# Patient Record
Sex: Male | Born: 1940 | Race: White | Hispanic: No | Smoking: Never smoker
Health system: Southern US, Community
[De-identification: ages and names within clinical notes are randomized; demographics above are authoritative.]

## PROBLEM LIST (undated history)

## (undated) DIAGNOSIS — E782 Mixed hyperlipidemia: Secondary | ICD-10-CM

## (undated) DIAGNOSIS — E039 Hypothyroidism, unspecified: Secondary | ICD-10-CM

## (undated) DIAGNOSIS — I10 Essential (primary) hypertension: Secondary | ICD-10-CM

## (undated) DIAGNOSIS — E079 Disorder of thyroid, unspecified: Secondary | ICD-10-CM

## (undated) DIAGNOSIS — E785 Hyperlipidemia, unspecified: Secondary | ICD-10-CM

## (undated) DIAGNOSIS — I219 Acute myocardial infarction, unspecified: Secondary | ICD-10-CM

## (undated) DIAGNOSIS — K219 Gastro-esophageal reflux disease without esophagitis: Secondary | ICD-10-CM

## (undated) DIAGNOSIS — Z5189 Encounter for other specified aftercare: Secondary | ICD-10-CM

## (undated) DIAGNOSIS — I2510 Atherosclerotic heart disease of native coronary artery without angina pectoris: Secondary | ICD-10-CM

## (undated) HISTORY — DX: Mixed hyperlipidemia: E78.2

## (undated) HISTORY — DX: Essential (primary) hypertension: I10

## (undated) HISTORY — DX: Hypothyroidism, unspecified: E03.9

## (undated) HISTORY — DX: Atherosclerotic heart disease of native coronary artery without angina pectoris: I25.10

## (undated) HISTORY — DX: Hyperlipidemia, unspecified: E78.5

## (undated) HISTORY — DX: Acute myocardial infarction, unspecified: I21.9

## (undated) HISTORY — PX: APPENDECTOMY: SHX54

## (undated) HISTORY — PX: THYROID SURGERY: SHX805

## (undated) HISTORY — DX: Encounter for other specified aftercare: Z51.89

## (undated) HISTORY — DX: Gastro-esophageal reflux disease without esophagitis: K21.9

## (undated) HISTORY — DX: Disorder of thyroid, unspecified: E07.9

## (undated) HISTORY — PX: HERNIA REPAIR: SHX51

## (undated) HISTORY — PX: SMALL INTESTINE SURGERY: SHX150

## (undated) HISTORY — PX: CORONARY ARTERY BYPASS GRAFT: SHX141

## (undated) HISTORY — PX: CERVICAL SPINE SURGERY: SHX589

---

## 1997-08-21 ENCOUNTER — Inpatient Hospital Stay (HOSPITAL_COMMUNITY): Admission: EM | Admit: 1997-08-21 | Discharge: 1997-08-24 | Payer: Self-pay | Admitting: Emergency Medicine

## 2000-12-28 ENCOUNTER — Encounter: Payer: Self-pay | Admitting: Cardiovascular Disease

## 2000-12-28 ENCOUNTER — Ambulatory Visit (HOSPITAL_COMMUNITY): Admission: RE | Admit: 2000-12-28 | Discharge: 2000-12-29 | Payer: Self-pay | Admitting: Cardiovascular Disease

## 2001-04-06 ENCOUNTER — Encounter: Payer: Self-pay | Admitting: *Deleted

## 2001-04-06 ENCOUNTER — Inpatient Hospital Stay (HOSPITAL_COMMUNITY): Admission: EM | Admit: 2001-04-06 | Discharge: 2001-04-07 | Payer: Self-pay | Admitting: *Deleted

## 2001-04-07 ENCOUNTER — Encounter: Payer: Self-pay | Admitting: Cardiovascular Disease

## 2001-04-29 ENCOUNTER — Ambulatory Visit (HOSPITAL_COMMUNITY): Admission: RE | Admit: 2001-04-29 | Discharge: 2001-04-29 | Payer: Self-pay | Admitting: Gastroenterology

## 2001-04-29 ENCOUNTER — Encounter: Payer: Self-pay | Admitting: Gastroenterology

## 2001-05-02 ENCOUNTER — Ambulatory Visit (HOSPITAL_COMMUNITY): Admission: RE | Admit: 2001-05-02 | Discharge: 2001-05-02 | Payer: Self-pay | Admitting: Gastroenterology

## 2001-05-11 ENCOUNTER — Encounter (INDEPENDENT_AMBULATORY_CARE_PROVIDER_SITE_OTHER): Payer: Self-pay | Admitting: Specialist

## 2001-05-11 ENCOUNTER — Ambulatory Visit (HOSPITAL_COMMUNITY): Admission: RE | Admit: 2001-05-11 | Discharge: 2001-05-11 | Payer: Self-pay | Admitting: Gastroenterology

## 2003-05-07 ENCOUNTER — Emergency Department (HOSPITAL_COMMUNITY): Admission: EM | Admit: 2003-05-07 | Discharge: 2003-05-07 | Payer: Self-pay | Admitting: *Deleted

## 2008-07-06 ENCOUNTER — Encounter: Admission: RE | Admit: 2008-07-06 | Discharge: 2008-07-06 | Payer: Self-pay | Admitting: Emergency Medicine

## 2008-07-07 ENCOUNTER — Encounter: Admission: RE | Admit: 2008-07-07 | Discharge: 2008-07-07 | Payer: Self-pay | Admitting: Emergency Medicine

## 2008-08-02 ENCOUNTER — Ambulatory Visit (HOSPITAL_COMMUNITY): Admission: RE | Admit: 2008-08-02 | Discharge: 2008-08-03 | Payer: Self-pay | Admitting: Neurosurgery

## 2009-08-30 ENCOUNTER — Inpatient Hospital Stay (HOSPITAL_COMMUNITY): Admission: RE | Admit: 2009-08-30 | Discharge: 2009-08-31 | Payer: Self-pay | Admitting: Cardiovascular Disease

## 2010-06-16 LAB — BASIC METABOLIC PANEL
BUN: 13 mg/dL (ref 6–23)
BUN: 14 mg/dL (ref 6–23)
CO2: 30 mEq/L (ref 19–32)
Calcium: 9.2 mg/dL (ref 8.4–10.5)
Chloride: 101 mEq/L (ref 96–112)
Creatinine, Ser: 0.82 mg/dL (ref 0.4–1.5)
GFR calc Af Amer: >60 mL/min (ref >60)
GFR calc Af Amer: >60 mL/min (ref >60)
GFR calc non Af Amer: >60 mL/min (ref >60)
Potassium: 3.9 mEq/L (ref 3.5–5.1)
Sodium: 137 mEq/L (ref 135–145)

## 2010-06-16 LAB — PROTIME-INR
INR: 0.99 (ref 0.00–1.49)
Prothrombin Time: 13 seconds (ref 11.6–15.2)

## 2010-06-16 LAB — CBC
MCHC: 34.2 g/dL (ref 30.0–36.0)
Platelets: 175 10*3/uL (ref 150–400)
Platelets: 204 10*3/uL (ref 150–400)
RBC: 4.8 MIL/uL (ref 4.22–5.81)
RDW: 13.4 % (ref 11.5–15.5)
WBC: 7.2 10*3/uL (ref 4.0–10.5)
WBC: 8.7 10*3/uL (ref 4.0–10.5)

## 2010-06-16 LAB — CARDIAC PANEL(CRET KIN+CKTOT+MB+TROPI)
CK, MB: 1.4 ng/mL (ref 0.3–4.0)
CK, MB: 1.5 ng/mL (ref 0.3–4.0)
Relative Index: INVALID (ref 0.0–2.5)
Total CK: 92 U/L (ref 7–232)
Total CK: 98 U/L (ref 7–232)
Troponin I: 0.3 ng/mL — ABNORMAL HIGH (ref 0.00–0.06)

## 2010-06-16 LAB — URINALYSIS, ROUTINE W REFLEX MICROSCOPIC
Bilirubin Urine: NEGATIVE
Glucose, UA: NEGATIVE mg/dL
Ketones, ur: NEGATIVE mg/dL
Protein, ur: NEGATIVE mg/dL

## 2010-07-08 LAB — CBC
HCT: 44.4 % (ref 39.0–52.0)
MCV: 89.8 fL (ref 78.0–100.0)
Platelets: 203 10*3/uL (ref 150–400)
RBC: 4.94 MIL/uL (ref 4.22–5.81)
WBC: 6.5 10*3/uL (ref 4.0–10.5)

## 2010-07-08 LAB — BASIC METABOLIC PANEL
BUN: 10 mg/dL (ref 6–23)
Chloride: 109 mEq/L (ref 96–112)
GFR calc Af Amer: >60 mL/min (ref >60)
GFR calc non Af Amer: >60 mL/min (ref >60)
Potassium: 4.3 mEq/L (ref 3.5–5.1)
Sodium: 144 mEq/L (ref 135–145)

## 2010-08-12 NOTE — Op Note (Signed)
NAME:  BAYLER, GEHRIG              ACCOUNT NO.:  [id number]   MEDICAL RECORD NO.:  [id number]          PATIENT TYPE:  INP   LOCATION:  3535                         FACILITY:  [facility]   PHYSICIAN:  Reinaldo Meeker, M.D. 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   PROCEDURE:  C5-6, C6-7 anterior cervicectomy, bone bank fusion followed  by helix anterior cervical plating.   SURGEON:  Reinaldo Meeker, MD   ASSISTANT:  Tia Alert, MD   PROCEDURE IN DETAIL:  After placed in the supine position and 5-pound  halter traction, the patient's neck was prepped and draped in usual  sterile fashion.  Localizing fluoroscopy was used prior to incision to  identify the appropriate level.  Transverse incision was made at the  right anterior neck started at the midline headed towards the medial  aspect of the sternocleidomastoid muscle.  The platysma muscle was then  incised transversely.  The natural fascial plane between the strap  muscles medially and the sternocleidomastoid laterally was identified  and followed down to the anterior aspect of the cervical spine.  Longus  coli muscle were identified, split in the midline, stripped away  bilaterally with the Medical illustrator.  Self-retaining  retractor was placed for exposure.  X-rays showed approach to be at the  appropriate levels.  Using a 15 blade, the herniated disk at C5-6 and C6-  7 was incised.  Using pituitary rongeurs and curettes approximately 90%  of the disk material was removed at both levels.  High-speed drill was  used to widen the interspaces of both levels as well.  At this time, the  microscope was draped, brought in the field, and used for the remainder  of the case.  Starting at C6-7, the remainder of  disks material and  posterior longitudinal ligament was removed.  It was then incised  transversely and the cut edge removed with a Kerrison punch.  Large  amounts of herniated disk material identified towards the right side and  these were removed in a piecemeal fashion to decompress and visualize  the underlying C7 nerve root on the right.  Similar decompression was  then carried towards left asymptomatic side until the proximal C7 nerve  root could be identified on that side as well.  At this time, inspection  was carried at this level for any evidence of residual compression and  none could be identified.  Attention was then turned to C5-6 where  similar procedure was carried out.  Once again the remainder of disk  material down the posterior longitudinal ligament was removed.  Once  again the ligament was incised and removed as well as removing herniated  disk material and bony overgrowth, which was compressing the C6 nerve  roots particularly on the right side.  When the nerve roots and spinal  dura were decompressed with this level, inspection was carried out  once  more at both levels for any evidence of residual compression, but none  could be identified.  Large amounts of irrigation were carried at this  time and any bleeding controlled with bipolar coagulation and Gelfoam.  Measurements were taken and a 6-mm bone bank plug was reconstituted for  C5-6 and a 7-mm plug for C6-7.  These were then packed without  difficulty and fluoroscopy showed them to be in excellent position.  Appropriate length helix anterior cervical plate was then chosen.  Under  fluoroscopic guidance, drill holes were placed followed by placing of 13-  mm screws x6 until the locking mechanism was secured bilaterally at all  3 levels bilaterally.  Final fluoroscopy showed the plate, screws, and  plug to be in good position.  Large amounts of irrigation was carried  out and any bleeding controlled with bipolar  coagulation.  It was then  closed with interrupted  Vicryl on the platysma muscle, inverted 5-0 PDS on the subcuticular  layer, and Steri-Strips on the skin.  Sterile dressing and soft collar  were then applied.  The patient was extubated and taken to recovery room  in stable condition.           ______________________________  Reinaldo Meeker, M.D.     ROK/MEDQ  D:  08/02/2008  T:  08/03/2008  Job:  [id number]

## 2010-08-15 NOTE — Discharge Summary (Signed)
[place]. [facility]  Patient:    Bradley Oconnor, Bradley Oconnor Visit Number: [id number] MRN: [id number]          Service Type: MED Location: [id number] Attending Physician:  Virgina Evener Dictated by:   Abelino Derrick, P.A.C. Admit Date:  04/06/2001 Discharge Date: 04/07/2001   CC:         Dr. Lesle Chris at [facility]   Discharge Summary  DISCHARGE DIAGNOSES: 1. Chest pain, probably non-cardiac in origin with patent grafts and patent    PCI site this admission. 2. Coronary disease, coronary artery bypass grafting in 1993, right coronary    artery PCI via saphenous vein graft in October 2002. 3. Hypertension. 4. Hyperlipidemia.  HISTORY OF PRESENT ILLNESS:  The patient is a 70 year old followed by Dr. Tresa Endo and Dr. Cleta Alberts, with a history of coronary disease.  He had bypass surgery in 1993, with a saphenous vein graft to the right coronary artery, left internal mammary artery to left anterior descending artery, and saphenous vein graft to the diagonal and circumflex.  He had right coronary artery PCI through the saphenous vein graft in October 2002.  He has normal left ventricular function.  He is admitted on 04/06/01, via the emergency room with 2 to 3 weeks of intermittent chest pain.  He says nitroglycerin gave him brief relief of his symptoms.  He described it as a "cramp-like" pain.  HOSPITAL COURSE:  He was admitted to telemetry, started on IV heparin and IV nitroglycerin.  He ruled out for an myocardial infarction.  He was set up for diagnostic catheterization which was done on 04/06/01, by Dr. Allyson Sabal.  This revealed patent grafts with a total right coronary artery, total circumflex, 70 to 80% left anterior descending artery, and a patent distal right coronary artery PCI site.  He has normal left ventricular function.  It was felt his chest pain was non-cardiac.  His right groin was without hematoma postoperatively.  Spiral CT was done prior  to discharge, and was negative for pulmonary embolism.  He did have a 1 cm nodule in the lingular area, and a followup CT is recommended in 3 to 4 months.  The patient was notified of the results, and given written instructions to have a followup chest CT through Dr. Cleta Alberts, his primary care physician.  He was given a prescription for Ultram one or two q.4-6h. p.r.n.  He knows to call the office if he continues to have symptoms, or develops other problems.  DISCHARGE MEDICATIONS: 1. Altace 10 mg q.d. 2. Toprol XL 50 mg q.d. 3. Prevacid 15 mg q.d. 4. Lipitor 20 mg q.d. 5. Aspirin 81 mg q.d. 6. Synthroid 0.1 mg q.d. 7. Niaspan 1 g h.s. 8. Nitroglycerin sublingual p.r.n. 9. Ultram 50 mg one or two q.4-6h. p.r.n.  LABORATORY DATA:  EKG showed sinus rhythm with inferior Qs.  Chest x-ray shows cardiomegaly, no active disease.  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  CT of the chest showed no evidence of pulmonary embolism.  There was as noted a 1 cm non-calcified pulmonary nodule versus round atelectasis within the lingula.  Followup CT is recommended for 3 to 4 months.  White blood cell count 5.9, hemoglobin 14.9, hematocrit 42.8, platelets 190.  INR 1.1.  Sodium 143, potassium 4.4, BUN 10, creatinine 1.0.  CK-MB and troponins are negative.  DISPOSITION:  The patient is discharged in stable condition.  FOLLOWUP: 1. He will follow up with Lezlie Octave, F.N.P. on  April 18, 2001, at 11 a.m. 2. As noted, he was given written instructions to follow up with Dr. Cleta Alberts for    a followup CT in 3 to 4 months. Dictated by:   Abelino Derrick, P.A.C. Attending Physician:  Virgina Evener DD:  04/21/01 TD:  04/22/01 Job: [id number]

## 2010-08-15 NOTE — Cardiovascular Report (Signed)
[place]. [facility]  Patient:    ADE, STMARIE Visit Number: [id number] MRN: [id number]          Service Type: CAT Location: [phone] Attending Physician:  Virgina Evener Dictated by:   Lennette Bihari, M.D. Proc. Date: 12/28/00 Admit Date:  12/28/2000                          Cardiac Catheterization  PROCEDURE: 1. Left heart catheterization. 2. Cine coronary angiography. 3. Biplane cine left ventriculography. 4. Distal aortography.  PROCEDURE:  CARDIOLOGIST:  Lennette Bihari, M.D.  INDICATIONS:  Mr. Vernon Maish is a 70 year old white male who suffered initial MI in July 1992 and underwent emergent PTCA of his right coronary artery.  In August 1993 following another myocardial infarction, he underwent bypass surgery with a LIMA to the LAD, vein to the optional diagonal or ramus intermedius sequentially to the distal circumflex, and vein to the right coronary artery.  In May 1999, catheterization showed patent grafts, and he had occlusion of his native RCA and circumflex with moderately significant native CAD in the LAD territory.  Additional problems have included hypothyroidism and hyperlipidemia.  The patient recently has noticed episodes of throat and neck discomfort exertionally precipitated.  A Cardiolite scan showed some scar in the inferolateral wall, but there was additional ischemia inferolaterally.  The patient is now referred for definitive diagnostic catheterization.  DESCRIPTION OF PROCEDURE:  After premedication with 2 mg Versed, the patient was prepped and draped in the usual fashion.  The right femoral artery was punctured anteriorly, and a 6-French sheath was inserted.  Diagnostic catheterization was done with 6-French Judkins 4 left and right coronary catheters.  A left internal mammary artery catheter was used for selective angiography into left internal mammary artery.  Biplane cine angiography was done with  pigtail catheter as was distal aortography.  With the demonstration of focal 85% eccentric stenosis in a large posterolateral vessel in the distal right coronary artery which was concordant with the patients inferolateral ischemia, the decision was made to proceed with intervention.  The arterial sheath was upgraded to a 7-French system.  Weight-adjusted heparin 4300 units was administered, and the patient also was given double bolus Integrilin.  A bypass guide with side holes, 7-French, was used for the interventional procedure.  Primary stenting was done utilizing a Forte wire for optimal sizing of lesion length, and a 3.0 x 15 mm Penta stent was then successfully deployed and dilated up to 13 atmospheres.  Cine angiography confirmed an excellent angiographic result.  ACT was documented to be therapeutic.  Arterial sheath was sutured in place with plans for sheath removal later today.  HEMODYNAMIC DATA: 1. Central aortic pressure was 130/77. 2. Left ventricular pressure 130/18.  ANGIOGRAPHIC DATA:  Left main coronary artery had 30% mild narrowing proximally and trifurcated into o an LAD, an intermediate vessel, and circumflex coronary artery.  The LAD had 40% ostial disease prior to a takeoff of a small, diffusely diseased first diagonal vessel.  There was 60 to 70% diffuse stenosis in the proximal LAD before and after the septal perforating artery.  A "flush and fill" phenomenon was seen in the mid LAD secondary to the lumen anastomosis.  The intermediate vessel was occluded proximally.  The native circumflex vessel was occluded proximally.  The native right coronary artery was occluded in its mid segment.  The saphenous vein graft supplying the right coronary artery  was a very large graft that anastomosed into the distal RCA.  Beyond this graft was a large PDA system, a large inferior LV system, and in the proximal portion of a large posterolateral system, there was an  eccentric 85% stenosis.  The sequential vein grafts supplying the intermediate vessel and the distal circumflex were widely patent.  The LIMA to the LAD was widely patent and anastomosed to the middle LAD, and the distal LAD was free of significant disease.  Biplane cine left ventriculography revealed low-normal global LV function. There was mild mid inferior hypocontractility seen on the RAO projection.  On the LAO projection, there was inferoseptal to inferoapical hypokinesis to akinesis.  Distal aortography did not demonstrate any renal artery stenosis.  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  Following coronary intervention with pretreatment with Plavix 300 mg, a double bolus Integrilin, and weight-adjusted heparinization, the posterolateral distal right coronary artery site was reduced from 85% to 0% after insertion of a 3.0 x 15 mm Penta stent.  There was no evidence for dissection.  There was TIMI-3 flow.  IMPRESSION: 1. Low-normal left ventricular function with mid diaphragmatic hypokinesis    with hypokinesis to akinesis of the inferoseptal to inferoapical segment. 2. Significant native coronary artery obstructive disease with 30% proximal    left main stenosis, 40% left anterior descending artery stenosis followed    by a diffuse 70% left anterior descending artery stenosis, proximal    occlusion of the intermediate vessel with occluded native left circumflex    coronary artery proximally, and occlusion of the native right coronary    artery in the mid segment. 3. Patent left internal mammary artery to the left anterior descending artery. 4. Patent saphenous vein graft to the ramus intermediate vessel and distal    circumflex marginal vessel, and patent saphenous vein graft to the right    coronary artery but with progressive disease in the proximal portion of a    large posterolateral distal branch of the right coronary  artery. 5. Mild distal aortic aneurysmal formation. 6. Successful primary stenting of the native distal right coronary artery     via the saphenous vein graft utilizing a 3.0 x 15 mm Penta stent done    with double bolus Integrilin, weight-adjusted heparinization, and    pretreatment with 300 mg of p.o. Plavix. Dictated by:   Lennette Bihari, M.D. Attending Physician:  Virgina Evener DD:  12/28/00 TD:  12/28/00 Job: [id number]

## 2010-08-15 NOTE — Discharge Summary (Signed)
[place]. [facility]  Patient:    Bradley Oconnor, Bradley Oconnor Visit Number: [id number] MRN: [id number]          Service Type: CAT Location: 3700 [phone] Attending Physician:  Virgina Evener Dictated by:   Marya Fossa, P.A. Admit Date:  12/28/2000 Discharge Date: 12/29/2000   CC:         Lesle Chris, M.D.   Discharge Summary  [facility] MEDICAL RECORD NUMBER:  [id number]  ADMISSION DIAGNOSES: 1. Abnormal Cardiolite suggesting ischemia in the inferolateral and    anterolateral segments. 2. Known coronary artery disease, status post coronary artery bypass graft. 3. Hyperlipidemia. 4. Hypothyroidism.  DISCHARGE DIAGNOSES: 1. Abnormal Cardiolite, status post cardiac catheterization on 12/28/00,    revealing high grade native right coronary artery distal disease, status    post intervention. 2. Known coronary artery disease, status post coronary artery bypass graft. 3. Hyperlipidemia. 4. Hypothyroidism.  HISTORY OF PRESENT ILLNESS:  Bradley Oconnor is a very pleasant 70 year old white male, patient of Dr. Nicki Guadalajara.  In brief, his history:  DMI in July 1992 with emergent percutaneous transluminal coronary angioplasty of the right coronary artery.  In August 1993, had an myocardial infarction again, and underwent bypass surgery with a LIMA to the left anterior descending artery, vein graft to the diagonal, vein graft to the marginal, vein graft to the right coronary artery.  In May 1999, he had cardiac catheterization which showed patent grafts, an occlusion of his native right coronary artery and circumflex proximally.  He was treated medically.  He also has hypothyroidism and hyperlipidemia.  The patient recently has had some vague discomfort in his throat.  A Cardiolite scan was performed on December 09, 2000, which showed inferior inferolateral wall scar with some additional ischemia inferolaterally  and anterolaterally.  During the stress test he did have some jaw discomfort.  For these reasons and his history of aggressive coronary artery disease, we recommended repeat cardiac catheterization.  The patient is agreeable, and accepts the risks and benefits.  This will be performed on December 28, 2000.  PROCEDURES:  Cardiac catheterization with intervention to the right coronary artery native on 12/28/00, by Dr. Nicki Guadalajara.  COMPLICATIONS:  None.  CONSULTATIONS:  None.  HOSPITAL COURSE:  Mr. Tolen was admitted to [facility] on 12/28/00, for cardiac catheterization in light of abnormal Cardiolite and jaw pain. Pre-procedure laboratory studies revealed a BUN of 16, creatinine 0.9, and potassium 4.8.  Hemoglobin 15.1.  INR 1.0.  The patient was taken to the cardiac catheterization laboratory by Dr. Nicki Guadalajara.  This revealed high grade proximal left anterior descending artery native disease, 30% left main disease, circumflex with 100% total occlusion in its proximal portion, right coronary artery with 100% total occlusion in its proximal portion.  The native right coronary artery had an 85% lesion in its distal portion after the anastomosis of the saphenous vein graft to the right coronary artery.  The vein graft of the LIMA to the left anterior descending artery was widely patent, vein graft to the diagonal was widely patent, vein graft to the marginal was widely patent, and the vein graft to the right coronary artery was widely patent.  Dr. Tresa Endo proceeded with intervention of the distal native right coronary artery through the vein graft of the right coronary artery, reducing the 85% lesion to 0%.  The patient tolerated the procedure well.  There were no problems during the procedure.  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  Iliacs and renal arteries intact.  Integrilin was bolused and infused for the  procedure.  The patient remained stable, and on 12/29/00, was felt stable for discharge to home.  No further chest pain.  Groin stable.  DISCHARGE MEDICATIONS:  1. Lipitor 20 mg q.d.  2. Niaspan 500 mg q.d.  3. Altace 10 mg q.d.  4. Imdur 30 mg q.d.  5. Toprol XL 50 mg q.d.  6. Prevacid 15 mg q.d.  7. Synthroid 0.1 mg q.d.  8. Vioxx 25 mg q.d.  9. Plavix 75 mg p.o. q.d. x 1 month. 10. Aspirin 81 mg q.d. 11. Nitroglycerin p.r.n. chest pain. 12. Vitamin E and vitamin C.  ACTIVITY:  No strenuous activity, lifting more than 5 pounds, or driving for two days.  DIET:  Low fat, low cholesterol, low salt diet.  He may shower.  He is asked to call the office if any question or problems.  FOLLOWUP:  He will follow up with Dr. Tresa Endo in the [facility] office on January 26, 2001, at 3:45.  Of note, if the patient is pain-free, he does not need to take Imdur on a daily basis. Dictated by:   Marya Fossa, P.A. Attending Physician:  Virgina Evener DD:  12/29/00 TD:  12/29/00 Job: [id number]

## 2010-08-15 NOTE — Cardiovascular Report (Signed)
[place]. [facility]  Patient:    Bradley Oconnor, Bradley Oconnor Visit Number: [id number] MRN: [id number]          Service Type: MED Location: [phone] Attending Physician:  Virgina Evener Dictated by:   Runell Gess, M.D. Proc. Date: 04/06/01 Admit Date:  04/06/2001   CC:         Second Floor [place] [facility]  [facility] [address] [place] [address]  Lennette Bihari, M.D.  Dr. Earl Lites, [facility]   Cardiac Catheterization  PROCEDURE:   Cardiac catheterization.  CARDIOLOGIST:  Runell Gess, M.D.  INDICATIONS:  Bradley Oconnor is a 70 year old married black male with history of CAD status post intervention in 1992 with LAD intervention.  He had MI again in 1993.  He ultimately underwent coronary artery bypass grafting.  His last catheterization was in October 2002 and had percutaneous coronary intervention and stenting of his posterolateral branch through his RCA graft.  He has been doing well since until recently when he developed crescendo angina.  He developed constant pain at 5 a.m., was taken to [facility] where he was treated with IV heparin and nitroglycerin.  He became pain free.  He ruled out for myocardial infarction.  There were no acute EKG changes.  He was brought to the catheterization lab for diagnostic coronary arteriography.  DESCRIPTION OF PROCEDURE:  The patient was brought to the second floor [place] [facility] lab in the postabsorptive state.  He was premedicated with p.o. Valium.  His right groin was prepped and shaved in the usual sterile fashion.  Xylocaine 1% was used for local anesthesia.  A 6-French sheath was inserted into the right femoral artery using standard Seldinger technique.  Then 6-French right and left Judkins diagnostic catheters along with a 6-French pigtail catheter were used for selective coronary angiography,  left ventriculography, subselective vein graft angiography, selective IMA angiography.  Omnipaque dye was used for the entirety of the diagnostic case.  Retrograde aortic, left ventricular, and pullback pressures were recorded.  HEMODYNAMICS: 1. Aortic systolic pressure 143, diastolic pressure 81 2. Left ventricular systolic pressure 138, diastolic pressure 24.  SELECTIVE CORONARY ANGIOGRAPHY: 1. Left Main: Normal. 2. LAD: The LAD had long segmental disease in the proximal and mid portion.    There was competitive flow visualized from the IMA graft. 3. Left Circumflex:  Totally occluded in its proximal portion. 4. Optional diagonal: Medium size vessel that appeared free of significant    disease. 5. Right coronary artery: Occluded in its mid portion. 6. Vein graft to distal RCA: Widely patent.  The previous percutaneous    coronary intervention site involving the posterolateral artery was widely    patent. 7. Vein graft to diagonal and OM sequentially: Widely patent. 8. LIMA to the LAD: Widely patent.  LEFT VENTRICULOGRAPHY:  RAO left ventriculogram was performed using 25 cc of Omnipaque dye at 12 cc/second.  The overall LV EF was estimated at 60% without focal wall motion abnormalities.  IMPRESSION:  Bradley Oconnor has widely patent grafts at a patent previous percutaneous coronary intervention site.  I see no potential "culprit" lesions responsible for his symptoms.  ACT was measured, and the sheaths were removed. Pressure was held to the groin to achieve hemostasis.  The patient left the lab in stable condition.  PLAN:  Continue medical therapy.  The patient will be discharged home in the morning if he remains clinically  stable overnight.  He left the lab in stable condition. Dictated by:   Runell Gess, M.D. Attending Physician:  Virgina Evener DD:  04/06/01 TD:  04/06/01 Job: [id number]

## 2011-11-17 ENCOUNTER — Encounter: Payer: Self-pay | Admitting: Emergency Medicine

## 2011-11-25 ENCOUNTER — Ambulatory Visit
Admission: RE | Admit: 2011-11-25 | Discharge: 2011-11-25 | Disposition: A | Discharge status: Home / Self Care | Payer: Medicare Other | Source: Ambulatory Visit | Attending: Emergency Medicine | Admitting: Emergency Medicine

## 2011-11-25 ENCOUNTER — Ambulatory Visit: Payer: Medicare Other

## 2011-11-25 ENCOUNTER — Ambulatory Visit (INDEPENDENT_AMBULATORY_CARE_PROVIDER_SITE_OTHER): Payer: Medicare Other | Admitting: Emergency Medicine

## 2011-11-25 ENCOUNTER — Telehealth: Payer: Self-pay | Admitting: Radiology

## 2011-11-25 VITALS — BP 112/62 | HR 62 | Temp 97.7°F | Resp 17 | Ht 68.0 in | Wt 202.0 lb

## 2011-11-25 DIAGNOSIS — N10 Acute tubulo-interstitial nephritis: Secondary | ICD-10-CM

## 2011-11-25 DIAGNOSIS — R319 Hematuria, unspecified: Secondary | ICD-10-CM

## 2011-11-25 DIAGNOSIS — N200 Calculus of kidney: Secondary | ICD-10-CM

## 2011-11-25 DIAGNOSIS — R109 Unspecified abdominal pain: Secondary | ICD-10-CM

## 2011-11-25 DIAGNOSIS — R21 Rash and other nonspecific skin eruption: Secondary | ICD-10-CM

## 2011-11-25 LAB — POCT CBC
Granulocyte percent: 62.1 %G (ref 37–80)
MCH, POC: 30.7 pg (ref 27–31.2)
MCV: 95.3 fL (ref 80–97)
MID (cbc): 0.8 (ref 0–0.9)
MPV: 7.1 fL (ref 0–99.8)
POC LYMPH PERCENT: 30 %L (ref 10–50)
POC MID %: 7.9 %M (ref 0–12)
Platelet Count, POC: 214 10*3/uL (ref 142–424)
RBC: 4.82 M/uL (ref 4.69–6.13)
RDW, POC: 15.1 %
WBC: 9.9 10*3/uL (ref 4.6–10.2)

## 2011-11-25 LAB — POCT URINALYSIS DIPSTICK
Nitrite, UA: NEGATIVE
Protein, UA: 30
Spec Grav, UA: >=1.03
Urobilinogen, UA: 0.2

## 2011-11-25 LAB — POCT UA - MICROSCOPIC ONLY
Casts, Ur, LPF, POC: NEGATIVE
Crystals, Ur, HPF, POC: NEGATIVE
Yeast, UA: NEGATIVE

## 2011-11-25 MED ORDER — CIPROFLOXACIN HCL 500 MG PO TABS: 500.0000 mg | ORAL_TABLET | Freq: Two times a day (BID) | ORAL | Status: DC

## 2011-11-25 MED ORDER — BETAMETHASONE DIPROPIONATE AUG 0.05 % EX CREA: TOPICAL_CREAM | Freq: Two times a day (BID) | CUTANEOUS | Status: DC

## 2011-11-25 MED ORDER — CEFTRIAXONE SODIUM 1 G IJ SOLR
1.0000 g | Freq: Once | INTRAMUSCULAR | Status: AC
  Administered 2011-11-25: 1 g via INTRAMUSCULAR

## 2011-11-25 NOTE — Progress Notes (Signed)
  Subjective:    Patient ID: Bradley Oconnor, male    DOB: [birth year], 71 y.o.   MRN: [id number]  HPI patient presents with right flank pain and frequent urination. Has history of kidney stones, has seen Dr Annabell Howells in the past with [facility]. Patient also has had remote history of colon surgery he saw Dr. 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 He sees Dr. Kinnie Scales for regular colonoscopies but he is 3 years behind on this.    Review of Systems     Objective:   Physical Exam HEENT exam is unremarkable his neck is supple his chest is clear abdomen is soft without masses. There is significant tenderness in the right flank area Results for orders placed in visit on 11/25/11  POCT CBC      Component Value Range   WBC 9.9  4.6 - 10.2 K/uL   Lymph, poc 3.0  0.6 - 3.4   POC LYMPH PERCENT 30.0  10 - 50 %L   MID (cbc) 0.8  0 - 0.9   POC MID % 7.9  0 - 12 %M   POC Granulocyte 6.1  2 - 6.9   Granulocyte percent 62.1  37 - 80 %G   RBC 4.82  4.69 - 6.13 M/uL   Hemoglobin 14.8  14.1 - 18.1 g/dL   HCT, POC 13.0  86.5 - 53.7 %   MCV 95.3  80 - 97 fL   MCH, POC 30.7  27 - 31.2 pg   MCHC 32.2  31.8 - 35.4 g/dL   RDW, POC 78.4     Platelet Count, POC 214  142 - 424 K/uL   MPV 7.1  0 - 99.8 fL  POCT URINALYSIS DIPSTICK      Component Value Range   Color, UA amber     Clarity, UA sl. cloudy     Glucose, UA neg     Bilirubin, UA small     Ketones, UA neg     Spec Grav, UA >=1.030     Blood, UA large     pH, UA 5.0     Protein, UA 30     Urobilinogen, UA 0.2     Nitrite, UA neg     Leukocytes, UA small (1+)    POCT UA - MICROSCOPIC ONLY      Component Value Range   WBC, Ur, HPF, POC 8-12     RBC, urine, microscopic 10-15     Bacteria, U Microscopic 2+     Mucus, UA positive     Epithelial cells, urine per micros 0-1     Crystals, Ur, HPF, POC neg     Casts, Ur, LPF, POC neg     Yeast, UA neg     [facility] reading (PRIMARY) by  Dr. Cleta Alberts acute abdominal  series does not show any acute problems .       Assessment & Plan:  Patient presents with a history of kidney stones. He has a markedly abnormal urine with right flank tenderness. He was given a gram of Rocephin and placed onset though CT urogram was ordered . CT scan subsequently showed an 8 mm stone at the right UVJ. Dr. Clemetine Marker office will call and we will get him an emergent appointment for tomorrow. He was instructed to go straight to the emergency room if he develops fever or worsening pain.

## 2011-11-25 NOTE — Patient Instructions (Addendum)
Proceed to have a CT scan done I will give you a call report with the results

## 2011-11-25 NOTE — Telephone Encounter (Signed)
Dr Cleta Alberts wants pt to be advised he has spoken to urology, Dr Annabell Howells concerning his kidney stone. He has a very large obstructing stone. I am to advise patient that Dr Annabell Howells is to contact him tomorrow with a work in appt and patient is to go to the ER if he develops any worsening of his symptoms or develops fevers or chills.

## 2011-11-26 ENCOUNTER — Other Ambulatory Visit: Payer: Self-pay | Admitting: Urology

## 2011-11-26 NOTE — Telephone Encounter (Signed)
Spoke with patient and notified patient below information, also Dr. Cleta Alberts wanted patient to know that he has gallstones.  Dr. Belva Crome office has contacted patient and he has appt today at 10 am with Dr. Isabel Caprice.

## 2011-11-27 ENCOUNTER — Encounter (HOSPITAL_COMMUNITY): Payer: Self-pay | Admitting: Pharmacy Technician

## 2011-11-27 LAB — URINE CULTURE
Colony Count: NO GROWTH
Organism ID, Bacteria: NO GROWTH

## 2011-12-01 ENCOUNTER — Telehealth: Payer: Self-pay

## 2011-12-01 DIAGNOSIS — N200 Calculus of kidney: Secondary | ICD-10-CM

## 2011-12-01 NOTE — Telephone Encounter (Signed)
8mm stone, Dr Cleta Alberts wants him to bring the stone here for analysis, he will ask for me. Stone analysis ordered for him.

## 2011-12-01 NOTE — Telephone Encounter (Signed)
Pt would like to talk to amy or dr Cleta Alberts regarding his kidney stones

## 2011-12-01 NOTE — Telephone Encounter (Signed)
I spoke to patient he passed his kidney

## 2011-12-01 NOTE — Addendum Note (Signed)
Addended by: Johnnette Litter on: 12/01/2011 01:25 PM   Modules accepted: Orders

## 2011-12-01 NOTE — Telephone Encounter (Signed)
Passed his kidney stone, I need to ask Dr Cleta Alberts what to do with this. Will call patient back.

## 2011-12-04 ENCOUNTER — Inpatient Hospital Stay (HOSPITAL_COMMUNITY): Admission: RE | Admit: 2011-12-04 | Payer: Medicare Other | Source: Ambulatory Visit

## 2011-12-04 LAB — STONE ANALYSIS

## 2011-12-07 ENCOUNTER — Ambulatory Visit (HOSPITAL_COMMUNITY): Admission: RE | Admit: 2011-12-07 | Payer: Medicare Other | Source: Ambulatory Visit | Admitting: Urology

## 2011-12-07 ENCOUNTER — Encounter (HOSPITAL_COMMUNITY): Admission: RE | Payer: Self-pay | Source: Ambulatory Visit

## 2011-12-07 SURGERY — CYSTOSCOPY/RETROGRADE/URETEROSCOPY
Anesthesia: General | Laterality: Right

## 2011-12-09 ENCOUNTER — Ambulatory Visit (INDEPENDENT_AMBULATORY_CARE_PROVIDER_SITE_OTHER): Payer: Medicare Other | Admitting: Emergency Medicine

## 2011-12-09 VITALS — BP 116/60 | HR 55 | Temp 97.7°F | Resp 16 | Ht 67.0 in | Wt 204.4 lb

## 2011-12-09 DIAGNOSIS — Z23 Encounter for immunization: Secondary | ICD-10-CM

## 2011-12-09 DIAGNOSIS — K8020 Calculus of gallbladder without cholecystitis without obstruction: Secondary | ICD-10-CM

## 2011-12-09 DIAGNOSIS — N200 Calculus of kidney: Secondary | ICD-10-CM

## 2011-12-09 NOTE — Progress Notes (Signed)
  Subjective:    Patient ID: Bradley Oconnor, male    DOB: [birth year], 71 y.o.   MRN: [id number]  HPI patient had a followup on CT findings of cholelithiasis. He recently passed an 8 mm renal stone. He is now asymptomatic as regards to pain    Review of Systems     Objective:   Physical Exam cardiac exam unremarkable. Abdomen is soft liver and spleen not enlarged there no areas of tenderness        Assessment & Plan:  Patient will see Dr. Wenda Low get his opinion about gallbladder surgery.

## 2011-12-09 NOTE — Progress Notes (Signed)
71 year old male presents with follow up for gall stones.  States flomax makes him feels nauseous he recently passed an 8 mm stone. Stone analysis showed calcium oxalate stones. He went to the urologist this morning was encouraged to drink water no other specific treatment. He is in to discuss incidental cholelithiasis found at the time of his CT.

## 2011-12-10 ENCOUNTER — Other Ambulatory Visit: Payer: Self-pay | Admitting: Emergency Medicine

## 2011-12-10 DIAGNOSIS — K8020 Calculus of gallbladder without cholecystitis without obstruction: Secondary | ICD-10-CM

## 2012-01-27 ENCOUNTER — Encounter (INDEPENDENT_AMBULATORY_CARE_PROVIDER_SITE_OTHER): Payer: Self-pay | Admitting: Surgery

## 2012-01-27 ENCOUNTER — Ambulatory Visit (INDEPENDENT_AMBULATORY_CARE_PROVIDER_SITE_OTHER): Payer: Medicare Other | Admitting: Surgery

## 2012-01-27 VITALS — BP 148/60 | HR 72 | Temp 97.4°F | Resp 20 | Ht 69.0 in | Wt 201.2 lb

## 2012-01-27 DIAGNOSIS — N200 Calculus of kidney: Secondary | ICD-10-CM

## 2012-01-27 DIAGNOSIS — K8020 Calculus of gallbladder without cholecystitis without obstruction: Secondary | ICD-10-CM

## 2012-01-27 DIAGNOSIS — Z9889 Other specified postprocedural states: Secondary | ICD-10-CM

## 2012-01-27 DIAGNOSIS — Z9049 Acquired absence of other specified parts of digestive tract: Secondary | ICD-10-CM

## 2012-01-27 NOTE — Progress Notes (Signed)
Mr. Sarr is an old patient of mine on whom I did an ileocecectomy for a ruptured appendix back in the mid 90s. He recently had an 8 mm kidney stone which she was fortunate to pass and during the workup had a CT scan which showed calcified gallstones. These first described as small.  Mr. Mccormac is not symptomatic from his gallstones. He denies recurrent nausea vomiting or abdominal pain. 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  In the absence of symptoms I think that it would be perfectly fine to observe him symptomatically. I will be happy to proceed with cholecystectomy should he become symptomatic I indicated to him that because of his previous surgery a laparoscopic approach might be a little more complicated. He is very comfortable with this and really this was the way he wanted to manage that.  I would be happy to see him again as needed-- return when necessary

## 2012-01-27 NOTE — Patient Instructions (Addendum)
Thanks for your patience.  If you need further assistance after leaving the office, please call our office and speak with a [facility] nurse.  [phone].  If you want to leave a message for Dr. Daphine Deutscher, please call his office phone at [phone].  Cholelithiasis Cholelithiasis (also called gallstones) is a form of gallbladder disease where gallstones form in your gallbladder. The gallbladder is a non-essential organ that stores bile made in the liver, which helps digest fats. Gallstones begin as small crystals and slowly grow into stones. Gallstone pain occurs when the gallbladder spasms, and a gallstone is blocking the duct. Pain can also occur when a stone passes out of the duct.  Women are more likely to develop gallstones than men. Other factors that increase the risk of gallbladder disease are:  Having multiple pregnancies. Physicians sometimes advise removing diseased gallbladders before future pregnancies.  Obesity.  Diets heavy in fried foods and fat.  Increasing age (older than 50).  Prolonged use of medications containing male hormones.  Diabetes mellitus.  Rapid weight loss.  Family history of gallstones (heredity). SYMPTOMS  Feeling sick to your stomach (nauseous).  Abdominal pain.  Yellowing of the skin (jaundice).  Sudden pain. It may persist from several minutes to several hours.  Worsening pain with deep breathing or when jarred.  Fever.  Tenderness to the touch. In some cases, when gallstones do not move into the bile duct, people have no pain or symptoms. These are called "silent" gallstones. TREATMENT In severe cases, emergency surgery may be required. HOME CARE INSTRUCTIONS   Only take over-the-counter or prescription medicines for pain, discomfort, or fever as directed by your caregiver.  Follow a low-fat diet until seen again. Fat causes the gallbladder to contract, which can result in pain.  Follow up as instructed. Attacks are almost always  recurrent and surgery is usually required for permanent treatment. SEEK IMMEDIATE MEDICAL CARE IF:   Your pain increases and is not controlled by medications.  You have an oral temperature above 102 F (38.9 C), not controlled by medication.  You develop nausea and vomiting. MAKE SURE YOU:   Understand these instructions.  Will watch your condition.  Will get help right away if you are not doing well or get worse. Document Released: 03/12/2005 Document Revised: 06/08/2011 Document Reviewed: 05/15/2010 [facility] Patient Information 2013 [place], [place].

## 2012-02-11 ENCOUNTER — Other Ambulatory Visit (HOSPITAL_COMMUNITY): Payer: Self-pay | Admitting: *Deleted

## 2012-02-11 DIAGNOSIS — I10 Essential (primary) hypertension: Secondary | ICD-10-CM

## 2012-02-16 ENCOUNTER — Ambulatory Visit (INDEPENDENT_AMBULATORY_CARE_PROVIDER_SITE_OTHER): Payer: Medicare Other | Admitting: Emergency Medicine

## 2012-02-16 VITALS — BP 105/64 | HR 60 | Temp 97.4°F | Resp 16 | Ht 68.0 in | Wt 201.0 lb

## 2012-02-16 DIAGNOSIS — N200 Calculus of kidney: Secondary | ICD-10-CM

## 2012-02-16 DIAGNOSIS — E782 Mixed hyperlipidemia: Secondary | ICD-10-CM

## 2012-02-16 DIAGNOSIS — I10 Essential (primary) hypertension: Secondary | ICD-10-CM

## 2012-02-16 LAB — POCT URINALYSIS DIPSTICK
Leukocytes, UA: NEGATIVE
Protein, UA: NEGATIVE
Urobilinogen, UA: 0.2

## 2012-02-16 LAB — POCT UA - MICROSCOPIC ONLY: Crystals, Ur, HPF, POC: NEGATIVE

## 2012-02-16 NOTE — Progress Notes (Signed)
  Subjective:    Patient ID: Bradley Oconnor, male    DOB: 08/05/1940, 71 y.o.   MRN: [id number]  HPI patient felt fine since his last episode of abdominal pain. He has been to see Dr. Wenda Low and discuss cholecystectomy but has decided against it. He has no abdominal pain he is eating well no complaints. He is known to have nephrolithiasis and chole lithiasis    Review of Systems     Objective:   Physical Exam patient looks great he is in no distress. There is no CVA tenderness there is a healed scar deep in the right lower abdomen. There are no areas of tenderness   Results for orders placed in visit on 02/16/12  POCT URINALYSIS DIPSTICK      Component Value Range   Color, UA dk. yellow     Clarity, UA clear     Glucose, UA neg     Bilirubin, UA small     Ketones, UA neg     Spec Grav, UA >=1.030     Blood, UA neg     pH, UA 5.0     Protein, UA neg     Urobilinogen, UA 0.2     Nitrite, UA neg     Leukocytes, UA Negative         Assessment & Plan:  Patient has had his flu shot. He is currently asymptomatic gallstones and we'll not proceed with cholecystectomy as long as he feels well. He continues to drink lots of water for his nephrolithiasis. We'll check a baseline UA today to see if there's any blood. UA is normal no change in treatment plan.

## 2012-03-07 ENCOUNTER — Ambulatory Visit (HOSPITAL_COMMUNITY)
Admission: RE | Admit: 2012-03-07 | Discharge: 2012-03-07 | Disposition: A | Discharge status: Home / Self Care | Payer: Medicare Other | Source: Ambulatory Visit | Attending: Cardiovascular Disease | Admitting: Cardiovascular Disease

## 2012-03-07 DIAGNOSIS — I359 Nonrheumatic aortic valve disorder, unspecified: Secondary | ICD-10-CM | POA: Insufficient documentation

## 2012-03-07 DIAGNOSIS — I517 Cardiomegaly: Secondary | ICD-10-CM | POA: Insufficient documentation

## 2012-03-07 DIAGNOSIS — I2510 Atherosclerotic heart disease of native coronary artery without angina pectoris: Secondary | ICD-10-CM | POA: Insufficient documentation

## 2012-03-07 DIAGNOSIS — I10 Essential (primary) hypertension: Secondary | ICD-10-CM

## 2012-03-07 DIAGNOSIS — I379 Nonrheumatic pulmonary valve disorder, unspecified: Secondary | ICD-10-CM | POA: Insufficient documentation

## 2012-03-07 DIAGNOSIS — I059 Rheumatic mitral valve disease, unspecified: Secondary | ICD-10-CM | POA: Insufficient documentation

## 2012-03-07 HISTORY — DX: Essential (primary) hypertension: I10

## 2012-03-07 NOTE — Progress Notes (Signed)
2D Echo Performed 03/07/2012    Ebenezer Mccaskey, RCS  

## 2012-08-30 ENCOUNTER — Telehealth: Payer: Self-pay | Admitting: *Deleted

## 2012-08-30 NOTE — Telephone Encounter (Signed)
Pharmacy is requesting to change the brand of levothyroxin to Sandoz band because Mylan brand is no longer available.  I faxed back the approval to change brands

## 2012-09-12 ENCOUNTER — Other Ambulatory Visit: Payer: Self-pay | Admitting: Cardiovascular Disease

## 2012-09-12 LAB — COMPREHENSIVE METABOLIC PANEL
ALT: 25 U/L (ref 0–53)
CO2: 27 mEq/L (ref 19–32)
Calcium: 9.3 mg/dL (ref 8.4–10.5)
Chloride: 103 mEq/L (ref 96–112)
Creat: 1.14 mg/dL (ref 0.50–1.35)
Glucose, Bld: 116 mg/dL — ABNORMAL HIGH (ref 70–99)
Total Bilirubin: 0.8 mg/dL (ref 0.3–1.2)
Total Protein: 7.3 g/dL (ref 6.0–8.3)

## 2012-09-12 LAB — LIPID PANEL
Cholesterol: 144 mg/dL (ref 0–200)
Total CHOL/HDL Ratio: 3.8 Ratio
Triglycerides: 169 mg/dL — ABNORMAL HIGH (ref <150)
VLDL: 34 mg/dL (ref 0–40)

## 2012-09-12 LAB — CBC
Platelets: 189 10*3/uL (ref 150–400)
RDW: 14.6 % (ref 11.5–15.5)
WBC: 7.2 10*3/uL (ref 4.0–10.5)

## 2012-09-29 ENCOUNTER — Encounter: Payer: Self-pay | Admitting: Cardiology

## 2012-10-03 ENCOUNTER — Encounter: Payer: Self-pay | Admitting: Cardiovascular Disease

## 2012-10-03 ENCOUNTER — Encounter: Payer: Self-pay | Admitting: *Deleted

## 2012-10-03 ENCOUNTER — Other Ambulatory Visit: Payer: Self-pay | Admitting: Cardiovascular Disease

## 2012-10-03 NOTE — Telephone Encounter (Signed)
Rx was sent to pharmacy electronically. 

## 2012-10-04 ENCOUNTER — Ambulatory Visit (INDEPENDENT_AMBULATORY_CARE_PROVIDER_SITE_OTHER): Payer: Medicare Other | Admitting: Cardiovascular Disease

## 2012-10-04 ENCOUNTER — Encounter: Payer: Self-pay | Admitting: Cardiovascular Disease

## 2012-10-04 ENCOUNTER — Other Ambulatory Visit: Payer: Self-pay | Admitting: Cardiovascular Disease

## 2012-10-04 VITALS — BP 126/62 | HR 58 | Ht 69.0 in | Wt 203.5 lb

## 2012-10-04 DIAGNOSIS — E785 Hyperlipidemia, unspecified: Secondary | ICD-10-CM

## 2012-10-04 DIAGNOSIS — E039 Hypothyroidism, unspecified: Secondary | ICD-10-CM

## 2012-10-04 DIAGNOSIS — I25810 Atherosclerosis of coronary artery bypass graft(s) without angina pectoris: Secondary | ICD-10-CM | POA: Insufficient documentation

## 2012-10-04 MED ORDER — ROSUVASTATIN CALCIUM 20 MG PO TABS: 20.0000 mg | ORAL_TABLET | Freq: Every evening | ORAL | Status: DC

## 2012-10-04 MED ORDER — HYDROCHLOROTHIAZIDE 12.5 MG PO CAPS: 12.5000 mg | ORAL_CAPSULE | ORAL | Status: DC | PRN

## 2012-10-04 MED ORDER — LEVOTHYROXINE SODIUM 100 MCG PO TABS: 100.0000 ug | ORAL_TABLET | Freq: Every day | ORAL | Status: DC

## 2012-10-04 NOTE — Patient Instructions (Signed)
Your physician recommends that you schedule a follow-up appointment in: 6 MONTHS. No changes has been made in your therapy today. 

## 2012-10-04 NOTE — Progress Notes (Signed)
Patient ID: Bradley Oconnor, male   DOB: [birth year], 72 y.o.   MRN: [id number]     HPI: Bradley Oconnor, is a 72 y.o. male who presents to the office for six-month cardiology evaluation.  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 At that time he underwent PTCA of a totally occluded right coronary artery. T. 93, due to progressive CAD, he underwent CABG surgery with a LIMA to his LAD, vein graft sequentially to a diagonal and marginal vein graft to his PDA branch of his right carotid artery. In September 2002 a stent was placed the peel-away branch to his right artery artery. In June 2011, he suffered a non-ST segment elevation MI which was felt to be due to RCA graft occlusion with supply the PDA and PLA vessel. The PDA was extensively collateralized now via the left circumflex territory. His native RCA was totally occluded at the mid level. His LIMA graft is widely patent as was the sequential graft to the diagonal marginal vessel. He has done well particularly with the addition of Ranexa titrated up to 1000 twice a day added to his medical regimen.   Approximally 45 days ago while at he was doing extreme work trying to aerate his lawn, he did experience an episode of chest pain. This ultimately subsided but required 2 sublingual nitroglycerin. Socially, he has not had any recurrent episodes of angina. He presents now for evaluation the it did have laboratory work done several weeks prior to this office visit.  Past Medical History  Diagnosis Date  . GERD (gastroesophageal reflux disease)   . Hyperlipidemia   . Heart attack   . CAD (coronary artery disease)   . Hypertension 03/07/12    ECHO-WNL     08/12/11 Lexiscan MyoviewNo significant ischemia demonstrated Low risk scan There is a moderate sized dense scar in the LCX territoy unchanged from the prior study.. Post- stress EF is 40%.    Past Surgical History    Procedure Laterality Date  . Hernia repair    . Thyroid surgery    . Appendectomy    . Coronary artery bypass graft      Allergies  Allergen Reactions  . Procardia (Nifedipine) Other (See Comments)    Lowers bp   . Phenergan (Promethazine Hcl) Nausea And Vomiting    Current Outpatient Prescriptions  Medication Sig Dispense Refill  . amLODipine (NORVASC) 5 MG tablet Take 1 tablet by mouth every day.  30 tablet  6  . aspirin 325 MG EC tablet Take 325 mg by mouth every evening.       Marland Kitchen augmented betamethasone dipropionate (DIPROLENE-AF) 0.05 % cream Apply 1 application topically 2 (two) times daily. APPLY TO RASH IN GROIN AREA      . fish oil-omega-3 fatty acids 1000 MG capsule Take 1 g by mouth daily.       . Garlic 1000 MG CAPS Take 1 capsule by mouth daily.       . hydrochlorothiazide (MICROZIDE) 12.5 MG capsule Take 12.5 mg by mouth as needed. SWOLLEN ANKLES      . HYDROcodone-acetaminophen (VICODIN) 5-500 MG per tablet Take 1-2 tablets by mouth every 6 (six) hours as needed. PAIN      . isosorbide mononitrate (IMDUR) 120 MG 24 hr tablet Take 240 mg by mouth every evening.       . lansoprazole (PREVACID) 15 MG capsule Take 15 mg by mouth daily.      Marland Kitchen  levothyroxine (SYNTHROID, LEVOTHROID) 100 MCG tablet Take 100 mcg by mouth daily before breakfast.       . metoprolol succinate (TOPROL-XL) 100 MG 24 hr tablet Take 125 mg by mouth daily. TAKES 1 AND 1/4 TABLET      . NITROSTAT 0.4 MG SL tablet Place 0.4 mg under the tongue every 5 (five) minutes as needed.       . ramipril (ALTACE) 10 MG capsule Take 10 mg by mouth every evening.       . ranolazine (RANEXA) 1000 MG SR tablet Take 1,000 mg by mouth 2 (two) times daily.      . rosuvastatin (CRESTOR) 20 MG tablet Take 20 mg by mouth every evening.        No current facility-administered medications for this visit.    Socially he is married. He does try to do some exercise. He remains relatively active with yard work. Is no tobacco  or alcohol use.  ROS is negative for fevers, chills or night sweats. He did express an episode of angina 45 days ago. He denies significant shortness of breath. He denies palpitations. He denies bleeding. He denies myalgias. Denies GERD symptoms. At times there is trace ankle swelling. He denies rash or tremor.   Other system review is negative.  PE BP 126/62  Pulse 58  Ht 5\' 9"  (1.753 m)  Wt 203 lb 8 oz (92.307 kg)  BMI 30.04 kg/m2  General: Alert, oriented, no distress.  Skin: normal turgor, no rashes HEENT: Normocephalic, atraumatic. Pupils round and reactive; sclera anicteric;no lid lag.  Nose without nasal septal hypertrophy Mouth/Parynx benign; Mallinpatti scale to Neck: No JVD, no carotid briuts Lungs: clear to ausculatation and percussion; no wheezing or rales Heart: RRR, s1 s2 normal faint 1/6 systolic murmur Abdomen: soft, nontender; no hepatosplenomehaly, BS+; abdominal aorta nontender and not dilated by palpation. Pulses 2+ Extremities: no clubbing cyanosis or edema, Homan's sign negative  Neurologic: grossly nonfocal  ECG: Sinus rhythm at 58 beats per minute. Nonspecific T changes. Evidence for old inferior MI with Q waves in leads 3 and aVF.  LABS:  BMET    Component Value Date/Time   NA 138 09/12/2012 0932   K 4.2 09/12/2012 0932   CL 103 09/12/2012 0932   CO2 27 09/12/2012 0932   GLUCOSE 116* 09/12/2012 0932   BUN 20 09/12/2012 0932   CREATININE 1.14 09/12/2012 0932   CREATININE 0.90 08/31/2009 0315   CALCIUM 9.3 09/12/2012 0932   GFRNONAA >60 08/31/2009 0315   GFRAA  Value: >60        The eGFR has been calculated using the MDRD equation. This calculation has not been validated in all clinical situations. eGFR's persistently <60 mL/min signify possible Chronic Kidney Disease. 08/31/2009 0315     Hepatic Function Panel     Component Value Date/Time   PROT 7.3 09/12/2012 0932   ALBUMIN 4.0 09/12/2012 0932   AST 20 09/12/2012 0932   ALT 25 09/12/2012 0932   ALKPHOS 53  09/12/2012 0932   BILITOT 0.8 09/12/2012 0932     CBC    Component Value Date/Time   WBC 7.2 09/12/2012 0932   WBC 9.9 11/25/2011 1313   RBC 4.96 09/12/2012 0932   RBC 4.82 11/25/2011 1313   HGB 14.8 09/12/2012 0932   HGB 14.8 11/25/2011 1313   HCT 43.2 09/12/2012 0932   HCT 45.9 11/25/2011 1313   PLT 189 09/12/2012 0932   MCV 87.1 09/12/2012 0932   MCV 95.3 11/25/2011 1313  MCH 29.8 09/12/2012 0932   MCH 30.7 11/25/2011 1313   MCHC 34.3 09/12/2012 0932   MCHC 32.2 11/25/2011 1313   RDW 14.6 09/12/2012 0932     BNP No results found for this basename: probnp    Lipid Panel     Component Value Date/Time   CHOL 144 09/12/2012 0932   TRIG 169* 09/12/2012 0932   HDL 38* 09/12/2012 0932   CHOLHDL 3.8 09/12/2012 0932   VLDL 34 09/12/2012 0932   LDLCALC 72 09/12/2012 0932     RADIOLOGY: No results found.    ASSESSMENT AND PLAN: Ms. Searls is now 22 years status post his inferior wall myocardial infarction treated with initial PTCA. He is 20 years status post CBG surgery. He has documented RCA graft occlusion with good left-to-right collaterals. He states that Ranexa has made a remarkable difference in his symptomatology to his tolerating his medications well. His laboratory was thoroughly reviewed with him in detail. We did discuss further reduction in carbohydrates and sweets in light of his elevated triglycerides at 169 and fasting glucose of 116. I will see him in 6 months for cardiology follow up evaluation the he will continue his current regimen and contact us if he does note increased symptoms of chest pain.     Lennette Bihari, MD, [facility]  10/04/2012 11:09 AM

## 2012-10-04 NOTE — Telephone Encounter (Signed)
Rx was sent to pharmacy electronically. 

## 2012-10-12 ENCOUNTER — Other Ambulatory Visit: Payer: Self-pay | Admitting: Cardiovascular Disease

## 2012-10-12 ENCOUNTER — Other Ambulatory Visit: Payer: Self-pay | Admitting: Emergency Medicine

## 2012-10-25 ENCOUNTER — Other Ambulatory Visit: Payer: Self-pay | Admitting: Cardiovascular Disease

## 2012-10-25 NOTE — Telephone Encounter (Signed)
Rx was sent to pharmacy electronically. 

## 2013-02-01 ENCOUNTER — Ambulatory Visit (INDEPENDENT_AMBULATORY_CARE_PROVIDER_SITE_OTHER): Payer: Medicare Other | Admitting: *Deleted

## 2013-02-01 DIAGNOSIS — Z23 Encounter for immunization: Secondary | ICD-10-CM

## 2013-02-10 ENCOUNTER — Other Ambulatory Visit: Payer: Self-pay | Admitting: *Deleted

## 2013-02-10 MED ORDER — RAMIPRIL 10 MG PO CAPS: 10.0000 mg | ORAL_CAPSULE | Freq: Every evening | ORAL | Status: DC

## 2013-02-10 NOTE — Telephone Encounter (Signed)
Rx was sent to pharmacy electronically. 

## 2013-03-02 ENCOUNTER — Telehealth: Payer: Self-pay | Admitting: Cardiovascular Disease

## 2013-03-02 NOTE — Telephone Encounter (Signed)
Returned call and pt verified x 2.  Pt wants to know if there is an alternative to Ranexa.  Stated he just got a refill and the price has gone up.  Pt stated it will cost him $300/month and he chooses not to pay that much.  Pt informed Dr. Pierre Bali, CMA will be notified.  Pt verbalized understanding and agreed w/ plan.  Pt would like Bradley Oconnor to call him back and informed message will be sent.  Message forwarded to [place], [place].

## 2013-03-02 NOTE — Telephone Encounter (Signed)
Please call-question about his medicine. °

## 2013-03-09 NOTE — Telephone Encounter (Signed)
Pt states has new insurance for next year, price is $480/month.  Angry that we did not respond last week, when he could have chosen an insurance plan that would have covered for $40/month.  Offered to send forms for Ranexa Connect program to see if he qualifies.  Will mail form to patient.  Bradley Oconnor

## 2013-03-31 ENCOUNTER — Encounter (HOSPITAL_COMMUNITY): Payer: Self-pay | Admitting: Emergency Medicine

## 2013-03-31 ENCOUNTER — Emergency Department (HOSPITAL_COMMUNITY)
Admission: EM | Admit: 2013-03-31 | Discharge: 2013-03-31 | Disposition: A | Discharge status: Home / Self Care | Payer: Medicare HMO | Attending: Emergency Medicine | Admitting: Emergency Medicine

## 2013-03-31 ENCOUNTER — Ambulatory Visit: Payer: Managed Care, Other (non HMO)

## 2013-03-31 ENCOUNTER — Ambulatory Visit (INDEPENDENT_AMBULATORY_CARE_PROVIDER_SITE_OTHER): Payer: Managed Care, Other (non HMO) | Admitting: Emergency Medicine

## 2013-03-31 ENCOUNTER — Ambulatory Visit (HOSPITAL_COMMUNITY): Payer: Medicare HMO

## 2013-03-31 VITALS — BP 126/74 | HR 55 | Temp 98.1°F | Resp 16 | Ht 67.0 in | Wt 200.6 lb

## 2013-03-31 DIAGNOSIS — Z7982 Long term (current) use of aspirin: Secondary | ICD-10-CM | POA: Insufficient documentation

## 2013-03-31 DIAGNOSIS — R1110 Vomiting, unspecified: Secondary | ICD-10-CM

## 2013-03-31 DIAGNOSIS — Z79899 Other long term (current) drug therapy: Secondary | ICD-10-CM | POA: Insufficient documentation

## 2013-03-31 DIAGNOSIS — K529 Noninfective gastroenteritis and colitis, unspecified: Secondary | ICD-10-CM

## 2013-03-31 DIAGNOSIS — Z9089 Acquired absence of other organs: Secondary | ICD-10-CM | POA: Insufficient documentation

## 2013-03-31 DIAGNOSIS — I10 Essential (primary) hypertension: Secondary | ICD-10-CM | POA: Insufficient documentation

## 2013-03-31 DIAGNOSIS — Z87442 Personal history of urinary calculi: Secondary | ICD-10-CM | POA: Insufficient documentation

## 2013-03-31 DIAGNOSIS — R143 Flatulence: Secondary | ICD-10-CM

## 2013-03-31 DIAGNOSIS — I252 Old myocardial infarction: Secondary | ICD-10-CM | POA: Insufficient documentation

## 2013-03-31 DIAGNOSIS — R141 Gas pain: Secondary | ICD-10-CM | POA: Insufficient documentation

## 2013-03-31 DIAGNOSIS — Z951 Presence of aortocoronary bypass graft: Secondary | ICD-10-CM | POA: Insufficient documentation

## 2013-03-31 DIAGNOSIS — K5289 Other specified noninfective gastroenteritis and colitis: Secondary | ICD-10-CM | POA: Insufficient documentation

## 2013-03-31 DIAGNOSIS — E785 Hyperlipidemia, unspecified: Secondary | ICD-10-CM | POA: Insufficient documentation

## 2013-03-31 DIAGNOSIS — K219 Gastro-esophageal reflux disease without esophagitis: Secondary | ICD-10-CM | POA: Insufficient documentation

## 2013-03-31 DIAGNOSIS — R109 Unspecified abdominal pain: Secondary | ICD-10-CM

## 2013-03-31 DIAGNOSIS — R142 Eructation: Secondary | ICD-10-CM | POA: Insufficient documentation

## 2013-03-31 DIAGNOSIS — I2510 Atherosclerotic heart disease of native coronary artery without angina pectoris: Secondary | ICD-10-CM | POA: Insufficient documentation

## 2013-03-31 LAB — POCT UA - MICROSCOPIC ONLY
CASTS, UR, LPF, POC: NEGATIVE
CRYSTALS, UR, HPF, POC: NEGATIVE
Mucus, UA: POSITIVE
YEAST UA: NEGATIVE

## 2013-03-31 LAB — CBC WITH DIFFERENTIAL/PLATELET
BASOS PCT: 0 % (ref 0–1)
Basophils Absolute: 0 10*3/uL (ref 0.0–0.1)
Eosinophils Absolute: 0 10*3/uL (ref 0.0–0.7)
Eosinophils Relative: 0 % (ref 0–5)
HCT: 46.9 % (ref 39.0–52.0)
HEMOGLOBIN: 15.9 g/dL (ref 13.0–17.0)
LYMPHS ABS: 1.7 10*3/uL (ref 0.7–4.0)
Lymphocytes Relative: 15 % (ref 12–46)
MCH: 30.8 pg (ref 26.0–34.0)
MCHC: 33.9 g/dL (ref 30.0–36.0)
MCV: 90.9 fL (ref 78.0–100.0)
MONOS PCT: 9 % (ref 3–12)
Monocytes Absolute: 1 10*3/uL (ref 0.1–1.0)
NEUTROS ABS: 8.8 10*3/uL — AB (ref 1.7–7.7)
NEUTROS PCT: 76 % (ref 43–77)
Platelets: 187 10*3/uL (ref 150–400)
RBC: 5.16 MIL/uL (ref 4.22–5.81)
RDW: 13.8 % (ref 11.5–15.5)
WBC: 11.6 10*3/uL — ABNORMAL HIGH (ref 4.0–10.5)

## 2013-03-31 LAB — COMPREHENSIVE METABOLIC PANEL
ALK PHOS: 71 U/L (ref 39–117)
ALT: 29 U/L (ref 0–53)
AST: 21 U/L (ref 0–37)
Albumin: 4.1 g/dL (ref 3.5–5.2)
BILIRUBIN TOTAL: 1 mg/dL (ref 0.3–1.2)
BUN: 25 mg/dL — AB (ref 6–23)
CHLORIDE: 97 meq/L (ref 96–112)
CO2: 27 mEq/L (ref 19–32)
Calcium: 9.9 mg/dL (ref 8.4–10.5)
Creatinine, Ser: 1.24 mg/dL (ref 0.50–1.35)
GFR calc Af Amer: 65 mL/min — ABNORMAL LOW (ref >90)
GFR calc non Af Amer: 56 mL/min — ABNORMAL LOW (ref >90)
Glucose, Bld: 126 mg/dL — ABNORMAL HIGH (ref 70–99)
POTASSIUM: 4.7 meq/L (ref 3.7–5.3)
SODIUM: 138 meq/L (ref 137–147)
Total Protein: 8.4 g/dL — ABNORMAL HIGH (ref 6.0–8.3)

## 2013-03-31 LAB — POCT URINALYSIS DIPSTICK
Blood, UA: NEGATIVE
GLUCOSE UA: NEGATIVE
Ketones, UA: 15
LEUKOCYTES UA: NEGATIVE
NITRITE UA: NEGATIVE
PROTEIN UA: 100
UROBILINOGEN UA: 1
pH, UA: 6

## 2013-03-31 LAB — POCT CBC
Granulocyte percent: 80.1 %G — AB (ref 37–80)
HCT, POC: 52.5 % (ref 43.5–53.7)
HEMOGLOBIN: 16.5 g/dL (ref 14.1–18.1)
LYMPH, POC: 2 (ref 0.6–3.4)
MCH, POC: 30.8 pg (ref 27–31.2)
MCHC: 31.4 g/dL — AB (ref 31.8–35.4)
MCV: 97.9 fL — AB (ref 80–97)
MID (CBC): 0.6 (ref 0–0.9)
MPV: 7.4 fL (ref 0–99.8)
PLATELET COUNT, POC: 226 10*3/uL (ref 142–424)
POC GRANULOCYTE: 10.3 — AB (ref 2–6.9)
POC LYMPH PERCENT: 15.6 %L (ref 10–50)
POC MID %: 4.3 % (ref 0–12)
RBC: 5.36 M/uL (ref 4.69–6.13)
RDW, POC: 15.5 %
WBC: 12.8 10*3/uL — AB (ref 4.6–10.2)

## 2013-03-31 LAB — CG4 I-STAT (LACTIC ACID): LACTIC ACID, VENOUS: 2.11 mmol/L (ref 0.5–2.2)

## 2013-03-31 LAB — IFOBT (OCCULT BLOOD): IMMUNOLOGICAL FECAL OCCULT BLOOD TEST: POSITIVE

## 2013-03-31 MED ORDER — IOHEXOL 300 MG/ML  SOLN
100.0000 mL | Freq: Once | INTRAMUSCULAR | Status: AC | PRN
  Administered 2013-03-31: 100 mL via INTRAVENOUS

## 2013-03-31 MED ORDER — ONDANSETRON 4 MG PO TBDP: 4.0000 mg | ORAL_TABLET | Freq: Three times a day (TID) | ORAL | Status: DC | PRN

## 2013-03-31 MED ORDER — DIPHENOXYLATE-ATROPINE 2.5-0.025 MG PO TABS: 1.0000 | ORAL_TABLET | Freq: Four times a day (QID) | ORAL | Status: DC | PRN

## 2013-03-31 MED ORDER — ONDANSETRON HCL 4 MG/2ML IJ SOLN
4.0000 mg | Freq: Once | INTRAMUSCULAR | Status: AC
  Administered 2013-03-31: 4 mg via INTRAVENOUS
  Filled 2013-03-31: qty 2

## 2013-03-31 MED ORDER — IOHEXOL 300 MG/ML  SOLN
50.0000 mL | Freq: Once | INTRAMUSCULAR | Status: AC | PRN
  Administered 2013-03-31: 50 mL via ORAL

## 2013-03-31 NOTE — Progress Notes (Addendum)
Subjective:  This chart was scribed for Arlyss Queen, MD by Roxan Diesel, ED scribe.  This patient was seen in [place] [facility] and the patient's care was started at 12:17 PM.   Patient ID: Bradley Oconnor, male    DOB: May 05, 1940, 73 y.o.   MRN: [id number]   HPI   HPI Comments: Bradley Oconnor is a 73 y.o. male who presents to [facility] complaining of diffuse abdominal pain with associated vomiting that began yesterday  Pt states he vomited 6 times from 5 AM-6AM this morning.  Emesis resembles stomach contents.  He also complains of associated abdominal distension.  He denies fevers.  He last passed gas early this morning.  Pt has h/o kidney stones, gallstones, appendectomy, and hernia surgery.  He did not have his gallstones removed and he states "that's what I figured it is."   Patient Active Problem List   Diagnosis Date Noted  . CAD (coronary artery disease) of artery bypass graft 10/04/2012  . Hypothyroidism 10/04/2012  . Hyperlipidemia LDL goal < 70 10/04/2012  . Kidney stones 01/27/2012  . Gallstones-symptomatic 01/27/2012  . Hx of appendectomy-history of ruptured requiring ileocecectomy (~1994) 01/27/2012    Past Medical History  Diagnosis Date  . GERD (gastroesophageal reflux disease)   . Hyperlipidemia   . Heart attack   . CAD (coronary artery disease)   . Hypertension 03/07/12    ECHO-WNL     08/12/11 Lexiscan MyoviewNo significant ischemia demonstrated Low risk scan There is a moderate sized dense scar in the LCX territoy unchanged from the prior study.. Post- stress EF is 40%.    Past Surgical History  Procedure Laterality Date  . Hernia repair    . Thyroid surgery    . Appendectomy    . Coronary artery bypass graft      Prior to Admission medications   Medication Sig Start Date End Date Taking? Authorizing Provider  amLODipine (NORVASC) 5 MG tablet Take 1 tablet by mouth every day. 10/03/12  Yes Troy Sine, MD  aspirin 325 MG EC tablet Take 325 mg by  mouth every evening.    Yes Historical Provider, MD  fish oil-omega-3 fatty acids 1000 MG capsule Take 1 g by mouth daily.    Yes Historical Provider, MD  Garlic [id number] MG CAPS Take 1 capsule by mouth daily.    Yes Historical Provider, MD  hydrochlorothiazide (MICROZIDE) 12.5 MG capsule Take 1 capsule (12.5 mg total) by mouth as needed. 10/04/12  Yes Troy Sine, MD  isosorbide mononitrate (IMDUR) 120 MG 24 hr tablet TAKE ONE TABLET BY MOUTH ONCE DAILY. 10/12/12  Yes Troy Sine, MD  lansoprazole (PREVACID) 15 MG capsule Take 15 mg by mouth daily.   Yes Historical Provider, MD  levothyroxine (SYNTHROID, LEVOTHROID) 100 MCG tablet Take 1 tablet (100 mcg total) by mouth daily before breakfast. 10/04/12  Yes Troy Sine, MD  metoprolol succinate (TOPROL-XL) 100 MG 24 hr tablet TAKE AS DIRECTED 10/25/12  Yes Troy Sine, MD  NITROSTAT 0.4 MG SL tablet Place 0.4 mg under the tongue every 5 (five) minutes as needed.  01/12/12  Yes Historical Provider, MD  ramipril (ALTACE) 10 MG capsule Take 1 capsule (10 mg total) by mouth every evening. 02/10/13  Yes Troy Sine, MD  RANEXA 1000 MG SR tablet TAKE ONE TABLET BY MOUTH TWICE DAILY 10/04/12  Yes Troy Sine, MD  rosuvastatin (CRESTOR) 20 MG tablet Take 1 tablet (20 mg total) by mouth every  evening. 10/04/12  Yes Troy Sine, MD  augmented betamethasone dipropionate (DIPROLENE-AF) 0.05 % cream Apply 1 application topically 2 (two) times daily. APPLY TO RASH IN GROIN AREA    Historical Provider, MD  HYDROcodone-acetaminophen (VICODIN) 5-500 MG per tablet Take 1-2 tablets by mouth every 6 (six) hours as needed. PAIN    Historical Provider, MD      Review of Systems  Constitutional: Negative for fever.  Gastrointestinal: Positive for vomiting, abdominal pain and abdominal distention.       Objective:   Physical Exam CONSTITUTIONAL: Well developed/well nourished HEAD: Normocephalic/atraumatic EYES: EOMI/PERRL ENMT: Mucous membranes  moist NECK: supple no meningeal signs SPINE:entire spine nontender CV: S1/S2 noted, no murmurs/rubs/gallops noted LUNGS: Lungs are clear to auscultation bilaterally, no apparent distress ABDOMEN: The abdomen is distended there are no areas of tenderness . There are multiple scars present there is no rebound. The abdomen is tympanitic to percussion. Bowel sounds are markedly decreased .   GU:no cva tenderness NEURO: Pt is awake/alert, moves all extremitiesx4 EXTREMITIES: pulses normal, full ROM SKIN: warm, color normal PSYCH: no abnormalities of mood noted Results for orders placed in visit on 03/31/13  POCT CBC      Result Value Range   WBC 12.8 (*) 4.6 - 10.2 K/uL   Lymph, poc 2.0  0.6 - 3.4   POC LYMPH PERCENT 15.6  10 - 50 %L   MID (cbc) 0.6  0 - 0.9   POC MID % 4.3  0 - 12 %M   POC Granulocyte 10.3 (*) 2 - 6.9   Granulocyte percent 80.1 (*) 37 - 80 %G   RBC 5.36  4.69 - 6.13 M/uL   Hemoglobin 16.5  14.1 - 18.1 g/dL   HCT, POC 52.5  43.5 - 53.7 %   MCV 97.9 (*) 80 - 97 fL   MCH, POC 30.8  27 - 31.2 pg   MCHC 31.4 (*) 31.8 - 35.4 g/dL   RDW, POC 15.5     Platelet Count, POC 226  142 - 424 K/uL   MPV 7.4  0 - 99.8 fL  IFOBT (OCCULT BLOOD)      Result Value Range   IFOBT Positive    POCT UA - MICROSCOPIC ONLY      Result Value Range   WBC, Ur, HPF, POC 0-1     RBC, urine, microscopic 0-1     Bacteria, U Microscopic trace     Mucus, UA positive     Epithelial cells, urine per micros rare     Crystals, Ur, HPF, POC neg     Casts, Ur, LPF, POC neg     Yeast, UA neg    POCT URINALYSIS DIPSTICK      Result Value Range   Color, UA amber     Clarity, UA sl cloudy     Glucose, UA neg     Bilirubin, UA small     Ketones, UA 15     Spec Grav, UA >=1.030     Blood, UA neg     pH, UA 6.0     Protein, UA 100     Urobilinogen, UA 1.0     Nitrite, UA neg     Leukocytes, UA Negative      BP 126/74  Pulse 55  Temp(Src) 98.1 F (36.7 C) (Oral)  Resp 16  Ht 5\' 7"  (1.702  m)  Wt 200 lb 9.3 oz (90.982 kg)  BMI 31.41 kg/m2  SpO2 97% [facility]  reading (PRIMARY) by  Dr. Everlene Farrier patient has multiple small air-fluid levels. The stomach is also full fluid       Assessment & Plan:  Patient presents with abdominal pain vomiting abnormal plain films with an elevated white count and heme positive stool. Patient will be sent to the emergency room for CT evaluation general surgery will also be notified that he is there. He is known to have gallstones . Of note there also was a trace of bilirubin in his urine.

## 2013-03-31 NOTE — ED Notes (Signed)
[facility] called by Dr. Osvaldo Angst for possible consult. Contact information given to EDP for [facility].

## 2013-03-31 NOTE — Discharge Instructions (Signed)
Diet for Diarrhea, Adult Frequent, runny stools (diarrhea) may be caused or worsened by food or drink. Diarrhea may be relieved by changing your diet. Since diarrhea can last up to 7 days, it is easy for you to lose too much fluid from the body and become dehydrated. Fluids that are lost need to be replaced. Along with a modified diet, make sure you drink enough fluids to keep your urine clear or pale yellow. DIET INSTRUCTIONS  Ensure adequate fluid intake (hydration): have 1 cup (8 oz) of fluid for each diarrhea episode. Avoid fluids that contain simple sugars or sports drinks, fruit juices, whole milk products, and sodas. Your urine should be clear or pale yellow if you are drinking enough fluids. Hydrate with an oral rehydration solution that you can purchase at pharmacies, retail stores, and online. You can prepare an oral rehydration solution at home by mixing the following ingredients together:    tsp table salt.   tsp baking soda.   tsp salt substitute containing potassium chloride.  1  tablespoons sugar.  1 L (34 oz) of water.  Certain foods and beverages may increase the speed at which food moves through the gastrointestinal (GI) tract. These foods and beverages should be avoided and include:  Caffeinated and alcoholic beverages.  High-fiber foods, such as raw fruits and vegetables, nuts, seeds, and whole grain breads and cereals.  Foods and beverages sweetened with sugar alcohols, such as xylitol, sorbitol, and mannitol.  Some foods may be well tolerated and may help thicken stool including:  Starchy foods, such as rice, toast, pasta, low-sugar cereal, oatmeal, grits, baked potatoes, crackers, and bagels.   Bananas.   Applesauce.  Add probiotic-rich foods to help increase healthy bacteria in the GI tract, such as yogurt and fermented milk products. RECOMMENDED FOODS AND BEVERAGES Starches Choose foods with less than 2 g of fiber per serving.  Recommended:  White,  [place], and pita breads, plain rolls, buns, bagels. Plain muffins, matzo. Soda, saltine, or graham crackers. Pretzels, melba toast, zwieback. Cooked cereals made with water: cornmeal, farina, cream cereals. Dry cereals: refined corn, wheat, rice. Potatoes prepared any way without skins, refined macaroni, spaghetti, noodles, refined rice.  Avoid:  Bread, rolls, or crackers made with whole wheat, multi-grains, rye, bran seeds, nuts, or coconut. Corn tortillas or taco shells. Cereals containing whole grains, multi-grains, bran, coconut, nuts, raisins. Cooked or dry oatmeal. Coarse wheat cereals, granola. Cereals advertised as "high-fiber." Potato skins. Whole grain pasta, wild or brown rice. Popcorn. Sweet potatoes, yams. Sweet rolls, doughnuts, waffles, pancakes, sweet breads. Vegetables  Recommended: Strained tomato and vegetable juices. Most well-cooked and canned vegetables without seeds. Fresh: Tender lettuce, cucumber without the skin, cabbage, spinach, bean sprouts.  Avoid: Fresh, cooked, or canned: Artichokes, baked beans, beet greens, broccoli, Brussels sprouts, corn, kale, legumes, peas, sweet potatoes. Cooked: Green or red cabbage, spinach. Avoid large servings of any vegetables because vegetables shrink when cooked, and they contain more fiber per serving than fresh vegetables. Fruit  Recommended: Cooked or canned: Apricots, applesauce, cantaloupe, cherries, fruit cocktail, grapefruit, grapes, kiwi, mandarin oranges, peaches, pears, plums, watermelon. Fresh: Apples without skin, ripe banana, grapes, cantaloupe, cherries, grapefruit, peaches, oranges, plums. Keep servings limited to  cup or 1 piece.  Avoid: Fresh: Apples with skin, apricots, mangoes, pears, raspberries, strawberries. Prune juice, stewed or dried prunes. Dried fruits, raisins, dates. Large servings of all fresh fruits. Protein  Recommended: Ground or well-cooked tender beef, ham, veal, lamb, pork, or poultry. Eggs. Fish,  oysters, shrimp,  lobster, other seafoods. Liver, organ meats. °· Avoid: Tough, fibrous meats with gristle. Peanut butter, smooth or chunky. Cheese, nuts, seeds, legumes, dried peas, beans, lentils. °Dairy °· Recommended: Yogurt, lactose-free milk, kefir, drinkable yogurt, buttermilk, soy milk, or plain hard cheese. °· Avoid: Milk, chocolate milk, beverages made with milk, such as milkshakes. °Soups °· Recommended: Bouillon, broth, or soups made from allowed foods. Any strained soup. °· Avoid: Soups made from vegetables that are not allowed, cream or milk-based soups. °Desserts and Sweets °· Recommended: Sugar-free gelatin, sugar-free frozen ice pops made without sugar alcohol. °· Avoid: Plain cakes and cookies, pie made with fruit, pudding, custard, cream pie. Gelatin, fruit, ice, sherbet, frozen ice pops. Ice cream, ice milk without nuts. Plain hard candy, honey, jelly, molasses, syrup, sugar, chocolate syrup, gumdrops, marshmallows. °Fats and Oils °· Recommended: Limit fats to less than 8 tsp per day. °· Avoid: Seeds, nuts, olives, avocados. Margarine, butter, cream, mayonnaise, salad oils, plain salad dressings. Plain gravy, crisp bacon without rind. °Beverages °· Recommended: Water, decaffeinated teas, oral rehydration solutions, sugar-free beverages not sweetened with sugar alcohols. °· Avoid: Fruit juices, caffeinated beverages (coffee, tea, soda), alcohol, sports drinks, or lemon-lime soda. °Condiments °· Recommended: Ketchup, mustard, horseradish, vinegar, cocoa powder. Spices in moderation: allspice, basil, bay leaves, celery powder or leaves, cinnamon, cumin powder, curry powder, ginger, mace, marjoram, onion or garlic powder, oregano, paprika, parsley flakes, ground pepper, rosemary, sage, savory, tarragon, thyme, turmeric. °· Avoid: Coconut, honey. °Document Released: 06/06/2003 Document Revised: 12/09/2011 Document Reviewed: 07/31/2011 °Exit[employer]® Patient Information ©2014 [employer]. °Viral  Gastroenteritis °Viral gastroenteritis is also called stomach flu. This illness is caused by a certain type of germ (virus). It can cause sudden watery poop (diarrhea) and throwing up (vomiting). This can cause you to lose body fluids (dehydration). This illness usually lasts for 3 to 8 days. It usually goes away on its own. °HOME CARE  °· Drink enough fluids to keep your pee (urine) clear or pale yellow. Drink small amounts of fluids often. °· Ask your doctor how to replace body fluid losses (rehydration). °· Avoid: °· Foods high in sugar. °· Alcohol. °· Bubbly (carbonated) drinks. °· Tobacco. °· Juice. °· Caffeine drinks. °· Very hot or cold fluids. °· Fatty, greasy foods. °· Eating too much at one time. °· Dairy products until 24 to 48 hours after your watery poop stops. °· You may eat foods with active cultures (probiotics). They can be found in some yogurts and supplements. °· Wash your hands well to avoid spreading the illness. °· Only take medicines as told by your doctor. Do not give aspirin to children. Do not take medicines for watery poop (antidiarrheals). °· Ask your doctor if you should keep taking your regular medicines. °· Keep all doctor visits as told. °GET HELP RIGHT AWAY IF:  °· You cannot keep fluids down. °· You do not pee at least once every 6 to 8 hours. °· You are short of breath. °· You see blood in your poop or throw up. This may look like coffee grounds. °· You have belly (abdominal) pain that gets worse or is just in one small spot (localized). °· You keep throwing up or having watery poop. °· You have a fever. °· The patient is a child younger than 3 months, and he or she has a fever. °· The patient is a child older than 3 months, and he or she has a fever and problems that do not go away. °· The patient is a child   older than 3 months, and he or she has a fever and problems that suddenly get worse. °· The patient is a baby, and he or she has no tears when crying. °MAKE SURE YOU:   °· Understand these instructions. °· Will watch your condition. °· Will get help right away if you are not doing well or get worse. °Document Released: 09/02/2007 Document Revised: 06/08/2011 Document Reviewed: 12/31/2010 °Exit[employer]® Patient Information ©2014 [employer]. ° °

## 2013-03-31 NOTE — ED Notes (Signed)
Pt was sent here from Dr Osvaldo Angst office for abd pain, distention, elevated WBC for further evaluation and Dr. Osvaldo Angst would like a CT scan done on abd.

## 2013-03-31 NOTE — ED Provider Notes (Signed)
CSN: [id number]     Arrival date & time 03/31/13  1358 History   First MD Initiated Contact with Patient 03/31/13 1446     Chief Complaint  Patient presents with  . Abdominal Pain  . abdominal distention   . Emesis    HPI  73 year old male abdominal pain nausea and vomiting since yesterday. Has history of kidney stones. His most recent kidney stone is November. During his evaluation had a CT scan was done, and was diagnosed with calcified gallstones. He's never been symptomatic from them.  He ate some rice like a piece screens and some pork yesterday. Within about an hour to start feeling full. He has some cramping pain. Probably left mid abdomen umbilical and epigastric. No particular right side of her right upper quadrant pain no back pain. He was uncomfortable with colicky pain to the night. It is intermittent. Nauseated and had several episodes of emesis morning. He has felt improvement since then. He saw his primary care physician Dr. Everlene Farrier. At outpatient acute abdominal series was sent in for evaluation of a possible small bowel traction. Surgical history includes coronary bypass grafting and laparotomy for perforated appendix with appendectomy and partial right colectomy.  Currently he states he has mild nausea. No current pain. He does still feel distended. He had a bowel movement this morning. Has passed gas since then.  Past Medical History  Diagnosis Date  . GERD (gastroesophageal reflux disease)   . Hyperlipidemia   . Heart attack   . CAD (coronary artery disease)   . Hypertension 03/07/12    ECHO-WNL     08/12/11 Lexiscan MyoviewNo significant ischemia demonstrated Low risk scan There is a moderate sized dense scar in the LCX territoy unchanged from the prior study.. Post- stress EF is 40%.   Past Surgical History  Procedure Laterality Date  . Hernia repair    . Thyroid surgery      1/2 thyroid removed on right side  . Appendectomy    . Coronary artery bypass graft    .  Cervical spine surgery      titanium plate in the back of neck   Family History  Problem Relation Age of Onset  . Heart disease Mother   . Cancer Mother     breast, stomach  . Heart disease Father   . Heart disease Brother   . Heart disease Paternal Grandfather   . Healthy Brother    History  Substance Use Topics  . Smoking status: Never Smoker   . Smokeless tobacco: Never Used  . Alcohol Use: No    Review of Systems  Constitutional: Negative for fever, chills, diaphoresis, appetite change and fatigue.  HENT: Negative for mouth sores, sore throat and trouble swallowing.   Eyes: Negative for visual disturbance.  Respiratory: Negative for cough, chest tightness, shortness of breath and wheezing.   Cardiovascular: Negative for chest pain.  Gastrointestinal: Positive for nausea, vomiting, abdominal pain and abdominal distention. Negative for diarrhea.  Endocrine: Negative for polydipsia, polyphagia and polyuria.  Genitourinary: Negative for dysuria, frequency and hematuria.  Musculoskeletal: Negative for gait problem.  Skin: Negative for color change, pallor and rash.  Neurological: Negative for dizziness, syncope, light-headedness and headaches.  Hematological: Does not bruise/bleed easily.  Psychiatric/Behavioral: Negative for behavioral problems and confusion.    Allergies  Procardia and Phenergan  Home Medications   Current Outpatient Rx  Name  Route  Sig  Dispense  Refill  . amLODipine (NORVASC) 5 MG tablet  Take 1 tablet by mouth every day.   30 tablet   6   . aspirin 325 MG EC tablet   Oral   Take 325 mg by mouth every evening.          . fish oil-omega-3 fatty acids 1000 MG capsule   Oral   Take 1 g by mouth daily.          . Garlic 123XX123 MG CAPS   Oral   Take 1 capsule by mouth daily.          . hydrochlorothiazide (MICROZIDE) 12.5 MG capsule   Oral   Take 12.5 mg by mouth as needed (fluid and edema.).         Marland Kitchen isosorbide mononitrate  (IMDUR) 120 MG 24 hr tablet   Oral   Take 120 mg by mouth daily.         . lansoprazole (PREVACID) 15 MG capsule   Oral   Take 15 mg by mouth daily.         Marland Kitchen levothyroxine (SYNTHROID, LEVOTHROID) 100 MCG tablet   Oral   Take 1 tablet (100 mcg total) by mouth daily before breakfast.   30 tablet   11   . metoprolol succinate (TOPROL-XL) 100 MG 24 hr tablet   Oral   Take 125 mg by mouth daily. Take with or immediately following a meal.         . NITROSTAT 0.4 MG SL tablet   Sublingual   Place 0.4 mg under the tongue every 5 (five) minutes as needed for chest pain.          . ramipril (ALTACE) 10 MG capsule   Oral   Take 1 capsule (10 mg total) by mouth every evening.   30 capsule   8   . ranolazine (RANEXA) 1000 MG SR tablet   Oral   Take 1,000 mg by mouth 2 (two) times daily.         . rosuvastatin (CRESTOR) 20 MG tablet   Oral   Take 1 tablet (20 mg total) by mouth every evening.   30 tablet   11   . diphenoxylate-atropine (LOMOTIL) 2.5-0.025 MG per tablet   Oral   Take 1 tablet by mouth 4 (four) times daily as needed for diarrhea or loose stools.   30 tablet   0   . ondansetron (ZOFRAN ODT) 4 MG disintegrating tablet   Oral   Take 1 tablet (4 mg total) by mouth every 8 (eight) hours as needed for nausea.   20 tablet   0    BP 135/68  Pulse 76  Temp(Src) 98.1 F (36.7 C) (Oral)  Resp 16  SpO2 95% Physical Exam  Constitutional: He is oriented to person, place, and time. He appears well-developed and well-nourished. No distress.  73 year old male sitting upright in a chair has the room. He moves with ease about the room. He does not appear peritoneal with his abdomen. He is not in any distress.  HENT:  Head: Normocephalic.  Eyes: Conjunctivae are normal. Pupils are equal, round, and reactive to light. No scleral icterus.  Neck: Normal range of motion. Neck supple. No thyromegaly present.  Cardiovascular: Normal rate and regular rhythm.  Exam  reveals no gallop and no friction rub.   No murmur heard. Pulmonary/Chest: Effort normal and breath sounds normal. No respiratory distress. He has no wheezes. He has no rales.  Abdominal: Soft. Bowel sounds are normal. He exhibits no distension. There is  no tenderness. There is no rebound.    Mild diffuse tenderness. No right upper quadrant tenderness.  Negative Murphy sign. No reproducible right upper quadrant tenderness. Slightly hypoactive bowel sounds diffusely. No high-pitched rushes.  Musculoskeletal: Normal range of motion.  Neurological: He is alert and oriented to person, place, and time.  Skin: Skin is warm and dry. No rash noted.  Psychiatric: He has a normal mood and affect. His behavior is normal.    ED Course  Procedures (including critical care time) Labs Review Labs Reviewed  CBC WITH DIFFERENTIAL - Abnormal; Notable for the following:    WBC 11.6 (*)    Neutro Abs 8.8 (*)    All other components within normal limits  COMPREHENSIVE METABOLIC PANEL - Abnormal; Notable for the following:    Glucose, Bld 126 (*)    BUN 25 (*)    Total Protein 8.4 (*)    GFR calc non Af Amer 56 (*)    GFR calc Af Amer 65 (*)    All other components within normal limits  CG4 I-STAT (LACTIC ACID)   Imaging Review Ct Abdomen Pelvis W Contrast  03/31/2013   CLINICAL DATA:  Mid abdominal pain with diarrhea  EXAM: CT ABDOMEN AND PELVIS WITH CONTRAST  TECHNIQUE: Multidetector CT imaging of the abdomen and pelvis was performed using the standard protocol following bolus administration of intravenous contrast.  CONTRAST:  13mL OMNIPAQUE IOHEXOL 300 MG/ML  SOLN  COMPARISON:  DG ABDOMEN ACUTE 2V W/ 1V CHEST dated 03/31/2013; CT ABD/PELV WO CM dated 11/25/2011  FINDINGS: The lung bases are clear.  No pericardial fluid.  No focal hepatic lesion. There is dependent sludge within the gallbladder. The pancreas, spleen, adrenal glands, kidneys are unchanged. There is a nonenhancing cyst extending from the  lower pole of the left kidney.  The stomach, duodenum, proximal small bowel are normal. There is enteric enteric anastomosis in the right lower quadrant with some dilatation proximal to the anastomosis and some fecalization this level. There is no significant dilatation of small bowel proximal to this anastomosis. There is postsurgical change at the cecum without evidence of obstruction. Contrast flows into the ascending colon. The transverse and descending colon are normal. There is fluid stool within the left colon and rectum.  Abdominal aorta is normal caliber with intimal calcification. No retroperitoneal periportal lymphadenopathy. No mesenteric adenopathy. No peritoneal disease.  No free fluid the pelvis. Prostate gland and bladder normal. No distal ureteral stones or bladder stones. No pelvic lymphadenopathy. No aggressive osseous lesion.  IMPRESSION: 1. Mild dilatation at enteric enteric anastomosis without evidence obstruction is felt to within normal limits. 2. Fluid stool within the left colon correlates with symptoms of diarrhea. 3. Dependent sludge within the gallbladder. No evidence of acute cholecystitis.   Electronically Signed   By: Suzy Bouchard M.D.   On: 03/31/2013 17:36   Dg Abd Acute W/chest  03/31/2013   CLINICAL DATA:  Abdominal pain, vomiting  EXAM: ACUTE ABDOMEN SERIES (ABDOMEN 2 VIEW & CHEST 1 VIEW)  COMPARISON:  11/25/2011  FINDINGS: Cardiomediastinal silhouette is stable. No acute infiltrate or pleural effusion. No pulmonary edema. The patient is status post median sternotomy. Mild distended small bowel loops are noted mid abdomen with air-fluid levels highly suspicious for bowel obstruction. Surgical clips are noted in right lower quadrant. No free abdominal air.  IMPRESSION: No acute disease within chest. Mild distended small bowel loops mid abdomen with air-fluid level suspicious for bowel obstruction. No free abdominal air.   Electronically  Signed   By: Lahoma Crocker M.D.   On:  03/31/2013 13:12    EKG Interpretation   None       MDM   1. Gastroenteritis     Different cyanosis would include biliary colic. Is in the differential it is no gallstones but his exam and history if no right quadrant pain that he gets this. We'll check liver enzymes. His outpatient films do show subtle findings of dilated bowel loops. His exam does not suggest obstruction but he does describe colicky pain with vomiting. CT scan will be obtained. Gastroenteritis or food-related enteritis with ileus is in the differential as well.  18:09:  CT scan shows no instruction. Minimal dilatation of the anastomosis but passage of contrast in the colon. He said tips of diarrhea since his admission here. No nausea by mouth contrast. He feels well. He has no pain. Has benign exam. I think this is supple enteritis. They be viral or foodborne. His hepatic and pancreatic enzymes are normal. No signs or findings that suggest acute cholecystitis plan home antiemetics, antidiarrheals. Clear liquids. Advance the diet. Recheck here with any worsening symptoms. Failure to pass her continue to pass stool or gas. Additional distention pain or vomiting.    Tanna Furry, MD 03/31/13 1810

## 2013-04-10 ENCOUNTER — Ambulatory Visit (INDEPENDENT_AMBULATORY_CARE_PROVIDER_SITE_OTHER): Payer: Medicare HMO | Admitting: Cardiovascular Disease

## 2013-04-10 ENCOUNTER — Encounter: Payer: Self-pay | Admitting: Cardiovascular Disease

## 2013-04-10 VITALS — BP 102/60 | HR 57 | Ht 67.0 in | Wt 202.1 lb

## 2013-04-10 DIAGNOSIS — E039 Hypothyroidism, unspecified: Secondary | ICD-10-CM

## 2013-04-10 DIAGNOSIS — I10 Essential (primary) hypertension: Secondary | ICD-10-CM

## 2013-04-10 DIAGNOSIS — K219 Gastro-esophageal reflux disease without esophagitis: Secondary | ICD-10-CM

## 2013-04-10 DIAGNOSIS — I25810 Atherosclerosis of coronary artery bypass graft(s) without angina pectoris: Secondary | ICD-10-CM

## 2013-04-10 DIAGNOSIS — I2510 Atherosclerotic heart disease of native coronary artery without angina pectoris: Secondary | ICD-10-CM

## 2013-04-10 DIAGNOSIS — E785 Hyperlipidemia, unspecified: Secondary | ICD-10-CM

## 2013-04-10 MED ORDER — ISOSORBIDE MONONITRATE ER 120 MG PO TB24: 120.0000 mg | ORAL_TABLET | Freq: Every day | ORAL | Status: DC

## 2013-04-10 MED ORDER — NITROGLYCERIN 0.4 MG SL SUBL: 0.4000 mg | SUBLINGUAL_TABLET | SUBLINGUAL | Status: DC | PRN

## 2013-04-10 MED ORDER — AMLODIPINE BESYLATE 5 MG PO TABS: ORAL_TABLET | ORAL | Status: DC

## 2013-04-10 NOTE — Progress Notes (Signed)
Patient ID: Bradley Oconnor, male   DOB: [birth year], 73 y.o.   MRN: [id number]     HPI: Bradley Oconnor, is a 73 y.o. male who presents to the office for six-month cardiology evaluation.  Mr. Ouch has established coronary disease dating back to 83 when he suffered an inferior wall myocardial infarction. At that time he underwent PTCA of a totally occluded right coronary artery. In 1993 due to progressive CAD, he underwent CABG surgery with a LIMA to his LAD, vein graft sequentially to a diagonal and marginal vein graft to his PDA branch of his right carotid artery. In September 2002 a stent was placed the PLA of his RCA.  In June 2011, he suffered a non-ST segment elevation MI which was felt to be due to RCA graft occlusion with supply the PDA and PLA vessel. The PDA was extensively collateralized now via the left circumflex territory. His native RCA was totally occluded at the mid level. His LIMA graft is widely patent as was the sequential graft to the diagonal marginal vessel. He has done well particularly with the addition of Ranexa titrated up to 1000 twice a day added to his medical regimen. He believes the Ranexa has made a huge difference in his anginal symptomatology. He is being fairly active. He is unaware of palpitations he denies presyncope or syncope.  He was recently evaluated in the emergency room abdominal discomfort, nausea and vomiting. A CT scan did not reveal acute obstruction. There is no evidence for gallstones. He had normal hepatic and pancreatic enzymes. He was felt most likely to have gastroenteritis.  Additional problems include GERD, hyperlipidemia, hypertension, and. He tells me he will be seeing Dr. Earlean Shawl and ultimately will have a colonoscopy done this year.   Past Medical History  Diagnosis Date  . GERD (gastroesophageal reflux disease)   . Hyperlipidemia   . Heart attack   . CAD (coronary artery disease)   . Hypertension 03/07/12    ECHO-WNL     08/12/11  Lexiscan MyoviewNo significant ischemia demonstrated Low risk scan There is a moderate sized dense scar in the LCX territoy unchanged from the prior study.. Post- stress EF is 40%.    Past Surgical History  Procedure Laterality Date  . Hernia repair    . Thyroid surgery      1/2 thyroid removed on right side  . Appendectomy    . Coronary artery bypass graft    . Cervical spine surgery      titanium plate in the back of neck    Allergies  Allergen Reactions  . Procardia [Nifedipine] Other (See Comments)    Lowers bp   . Phenergan [Promethazine Hcl] Nausea And Vomiting    Current Outpatient Prescriptions  Medication Sig Dispense Refill  . amLODipine (NORVASC) 5 MG tablet Take 1 tablet by mouth every day.  30 tablet  6  . aspirin 325 MG EC tablet Take 325 mg by mouth every evening.       . diphenoxylate-atropine (LOMOTIL) 2.5-0.025 MG per tablet Take 1 tablet by mouth 4 (four) times daily as needed for diarrhea or loose stools.  30 tablet  0  . fish oil-omega-3 fatty acids 1000 MG capsule Take 1 g by mouth daily.       . Garlic 5102 MG CAPS Take 1 capsule by mouth daily.       . hydrochlorothiazide (MICROZIDE) 12.5 MG capsule Take 12.5 mg by mouth as needed (fluid and edema.).      Marland Kitchen  isosorbide mononitrate (IMDUR) 120 MG 24 hr tablet Take 120 mg by mouth daily.      . lansoprazole (PREVACID) 15 MG capsule Take 15 mg by mouth daily.      Marland Kitchen levothyroxine (SYNTHROID, LEVOTHROID) 100 MCG tablet Take 1 tablet (100 mcg total) by mouth daily before breakfast.  30 tablet  11  . metoprolol succinate (TOPROL-XL) 100 MG 24 hr tablet Take 125 mg by mouth daily. Take with or immediately following a meal.      . NITROSTAT 0.4 MG SL tablet Place 0.4 mg under the tongue every 5 (five) minutes as needed for chest pain.       Marland Kitchen ondansetron (ZOFRAN ODT) 4 MG disintegrating tablet Take 1 tablet (4 mg total) by mouth every 8 (eight) hours as needed for nausea.  20 tablet  0  . ramipril (ALTACE) 10 MG  capsule Take 1 capsule (10 mg total) by mouth every evening.  30 capsule  8  . ranolazine (RANEXA) 1000 MG SR tablet Take 1,000 mg by mouth 2 (two) times daily.      . rosuvastatin (CRESTOR) 20 MG tablet Take 1 tablet (20 mg total) by mouth every evening.  30 tablet  11   No current facility-administered medications for this visit.    Socially he is married. He does try to do some exercise. He remains relatively active with yard work. Is no tobacco or alcohol use.  ROS is negative for fevers, chills or night sweats. He denies change in weight or appetite. He denies visual changes. There is no hearing issues. He is unaware of lymphadenopathy. He denies PND or orthopnea he denies wheezing. There is no syncope. He did express an episode of angina approximately 6 months ago. He feels all his medicines the Ranexa has made a dramatic improvement in his symptomatology.Marland Kitchen He denies significant shortness of breath. His abdominal pain has resolved. He denies bleeding. He denies myalgias. Denies GERD symptoms. At times there is trace ankle swelling. He denies rash or tremor.   He denies behavioral change. Other contents of 14 point system review is negative.  PE BP 102/60  Pulse 57  Ht 5\' 7"  (1.702 m)  Wt 202 lb 1.6 oz (91.672 kg)  BMI 31.65 kg/m2  General: Alert, oriented, no distress.  Skin: normal turgor, no rashes HEENT: Normocephalic, atraumatic. Pupils round and reactive; sclera anicteric;no lid lag.  Nose without nasal septal hypertrophy Mouth/Parynx benign; Mallinpatti scale 3 Neck: No JVD, no carotid briuts Lungs: clear to ausculatation and percussion; no wheezing or rales Chest wall: No tenderness to palpation Heart: RRR, s1 s2 normal faint 1/6 systolic murmur Abdomen: soft, nontender; no hepatosplenomehaly, BS+; abdominal aorta nontender and not dilated by palpation. Back: No CVA tenderness Pulses 2+ Extremities: no clubbing cyanosis or edema, Homan's sign negative  Neurologic: grossly  nonfocal Psychological: Normal affect and mood  ECG: Sinus rhythm at 58 beats per minute. Nonspecific T changes. Evidence for old inferior MI with Q waves in leads 3 and aVF.  LABS:  BMET    Component Value Date/Time   NA 138 03/31/2013 1510   K 4.7 03/31/2013 1510   CL 97 03/31/2013 1510   CO2 27 03/31/2013 1510   GLUCOSE 126* 03/31/2013 1510   BUN 25* 03/31/2013 1510   CREATININE 1.24 03/31/2013 1510   CREATININE 1.14 09/12/2012 0932   CALCIUM 9.9 03/31/2013 1510   GFRNONAA 56* 03/31/2013 1510   GFRAA 65* 03/31/2013 1510     Hepatic Function Panel  Component Value Date/Time   PROT 8.4* 03/31/2013 1510   ALBUMIN 4.1 03/31/2013 1510   AST 21 03/31/2013 1510   ALT 29 03/31/2013 1510   ALKPHOS 71 03/31/2013 1510   BILITOT 1.0 03/31/2013 1510     CBC    Component Value Date/Time   WBC 11.6* 03/31/2013 1510   WBC 12.8* 03/31/2013 1239   RBC 5.16 03/31/2013 1510   RBC 5.36 03/31/2013 1239   HGB 15.9 03/31/2013 1510   HGB 16.5 03/31/2013 1239   HCT 46.9 03/31/2013 1510   HCT 52.5 03/31/2013 1239   PLT 187 03/31/2013 1510   MCV 90.9 03/31/2013 1510   MCV 97.9* 03/31/2013 1239   MCH 30.8 03/31/2013 1510   MCH 30.8 03/31/2013 1239   MCHC 33.9 03/31/2013 1510   MCHC 31.4* 03/31/2013 1239   RDW 13.8 03/31/2013 1510   LYMPHSABS 1.7 03/31/2013 1510   MONOABS 1.0 03/31/2013 1510   EOSABS 0.0 03/31/2013 1510   BASOSABS 0.0 03/31/2013 1510     BNP No results found for this basename: probnp    Lipid Panel     Component Value Date/Time   CHOL 144 09/12/2012 0932   TRIG 169* 09/12/2012 0932   HDL 38* 09/12/2012 0932   CHOLHDL 3.8 09/12/2012 0932   VLDL 34 09/12/2012 0932   LDLCALC 72 09/12/2012 0932     RADIOLOGY: No results found.    ASSESSMENT AND PLAN: Ms. Mano suffered an  inferior wall myocardial infarction 22 years ago treated with initial PTCA. He is 21 years status post CABG surgery. He has documented RCA graft occlusion with good left-to-right collaterals. He states that Ranexa has made a remarkable difference in  his symptomatology to his tolerating his medications well. His blood pressure today is well controlled on combination therapy with amlodipine 5 mg, chlorothiazide 12.5 mg, Toprol-XL 125 mg, and Altace 10 mg.  Is not having significant recurrent anginal symptomatology on the above regimen and also takes isosorbide 120 mg in addition to Raynaud's and 1000 mg twice a day. He does have hyperlipidemia and is on Crestor 20 mg which he is tolerating. He does have GERD but this currently is controlled. He also has a history of hypothyroidism on Synthroid replacement 100 mcg. He is mildly overweight with body mass index of 31.6. I suggested to try to at least get his body mass index under the "obesity "threshold. He does at times have some Lotrimin edema but this seems to traumatically resolve once he takes his hydrochlorothiazide. I did review his recent emergency room evaluation as well as his recent laboratory done in the emergency room. Scratchings 1.24 BUN 25. His last lipid studies from June 2014 were excellent with the exception of mild triglyceride increase in slightly decreased HDL cholesterol. His laboratory was thoroughly reviewed with him in detail. We did discuss further reduction in carbohydrates and sweets in light of his elevated triglycerides at 169 and fasting glucose of 116. I will see him in 6 months for cardiology follow up evaluation the he will continue his current regimen and contact us if he does note increased symptoms of chest pain or other symptoms.     Troy Sine, MD, [facility]  04/10/2013 9:47 AM

## 2013-04-10 NOTE — Patient Instructions (Signed)
Your physician recommends that you schedule a follow-up appointment in: 6 months. No changes were made today in your therapy. 

## 2013-04-12 ENCOUNTER — Encounter: Payer: Self-pay | Admitting: Cardiovascular Disease

## 2013-06-19 ENCOUNTER — Other Ambulatory Visit: Payer: Self-pay

## 2013-06-19 ENCOUNTER — Telehealth: Payer: Self-pay | Admitting: Cardiovascular Disease

## 2013-06-19 MED ORDER — METOPROLOL SUCCINATE ER 100 MG PO TB24: ORAL_TABLET | ORAL | Status: DC

## 2013-06-19 MED ORDER — RANOLAZINE ER 1000 MG PO TB12: 1000.0000 mg | ORAL_TABLET | Freq: Two times a day (BID) | ORAL | Status: DC

## 2013-06-19 NOTE — Telephone Encounter (Signed)
Have a question about his medication Ranexa and metropolol.. Please Call.Marland Kitchen

## 2013-06-19 NOTE — Telephone Encounter (Signed)
Spoke to patient. He states he  needs samples  of Ranexa and METOPROLOL because he has been out for 1 and 1/2 weeks he thinks.  Informed patient that we have samples of Ranexa. Metoprolol succ is a generic  No samples.   RN informed patient will send a new prescription to pharmacy . Patient states he will come and pickup samples.

## 2013-06-19 NOTE — Telephone Encounter (Signed)
Rx was sent to pharmacy electronically. 

## 2013-07-13 ENCOUNTER — Telehealth: Payer: Self-pay | Admitting: Cardiovascular Disease

## 2013-07-13 ENCOUNTER — Telehealth: Payer: Self-pay

## 2013-07-13 NOTE — Telephone Encounter (Signed)
Telephone call to patient.  Advised him that we do not have a record of his blood type on file and that he can contact the [facility] to have the testing done.

## 2013-07-13 NOTE — Telephone Encounter (Signed)
Returned a call to patient informing him that we do not have his blood type on file. He may want to consult his PCP.

## 2013-07-13 NOTE — Telephone Encounter (Signed)
Pt called and would like to know if you know his blood type?

## 2013-07-13 NOTE — Telephone Encounter (Signed)
Patient called stated his insurance company [employer] is requesting his blood type. Please call patient [phone]

## 2013-09-06 ENCOUNTER — Telehealth: Payer: Self-pay | Admitting: Cardiovascular Disease

## 2013-09-06 MED ORDER — RANOLAZINE ER 1000 MG PO TB12: 1000.0000 mg | ORAL_TABLET | Freq: Two times a day (BID) | ORAL | Status: DC

## 2013-09-06 NOTE — Telephone Encounter (Signed)
Pt need some samples of Ranexa 1000 mg please.

## 2013-09-06 NOTE — Telephone Encounter (Signed)
Patient notified that samples are at front desk.

## 2013-10-07 ENCOUNTER — Other Ambulatory Visit: Payer: Self-pay | Admitting: Cardiovascular Disease

## 2013-10-11 ENCOUNTER — Other Ambulatory Visit: Payer: Self-pay | Admitting: *Deleted

## 2013-10-17 ENCOUNTER — Other Ambulatory Visit: Payer: Self-pay | Admitting: Cardiovascular Disease

## 2013-10-17 ENCOUNTER — Other Ambulatory Visit: Payer: Self-pay

## 2013-10-17 NOTE — Telephone Encounter (Signed)
Rx was sent to pharmacy electronically. 

## 2013-10-19 ENCOUNTER — Other Ambulatory Visit: Payer: Self-pay | Admitting: Cardiovascular Disease

## 2013-10-20 ENCOUNTER — Other Ambulatory Visit: Payer: Self-pay | Admitting: *Deleted

## 2013-10-20 MED ORDER — LEVOTHYROXINE SODIUM 100 MCG PO TABS: 100.0000 ug | ORAL_TABLET | Freq: Every day | ORAL | Status: DC

## 2013-10-20 NOTE — Telephone Encounter (Signed)
[name] - please order TSH lab and refill synthroid x 3 months.  Pt also due for appt with TK, 6 month f/u   Bradley Oconnor

## 2013-10-20 NOTE — Telephone Encounter (Signed)
erx sent for levothyroxin

## 2013-10-25 ENCOUNTER — Telehealth: Payer: Self-pay | Admitting: Cardiovascular Disease

## 2013-10-25 NOTE — Telephone Encounter (Signed)
Spoke with pt, aware samples placed at the front desk for pick up 

## 2013-10-25 NOTE — Telephone Encounter (Signed)
Patient needs samples of Ranexa 1000mg .

## 2013-11-09 ENCOUNTER — Other Ambulatory Visit: Payer: Self-pay | Admitting: Cardiovascular Disease

## 2013-11-09 NOTE — Telephone Encounter (Signed)
Rx refill sent to patient pharmacy   

## 2013-12-27 ENCOUNTER — Telehealth: Payer: Self-pay | Admitting: Cardiovascular Disease

## 2013-12-27 MED ORDER — RANOLAZINE ER 1000 MG PO TB12: 1000.0000 mg | ORAL_TABLET | Freq: Two times a day (BID) | ORAL | Status: DC

## 2013-12-27 NOTE — Telephone Encounter (Signed)
Pt would like some samples of Ranexa 1000 mg please.

## 2013-12-27 NOTE — Telephone Encounter (Signed)
Left VM for patient that samples will be at front desk for pick up.

## 2014-02-01 ENCOUNTER — Ambulatory Visit: Payer: Managed Care, Other (non HMO)

## 2014-02-01 ENCOUNTER — Ambulatory Visit (INDEPENDENT_AMBULATORY_CARE_PROVIDER_SITE_OTHER): Payer: Managed Care, Other (non HMO) | Admitting: *Deleted

## 2014-02-01 DIAGNOSIS — Z23 Encounter for immunization: Secondary | ICD-10-CM

## 2014-02-14 ENCOUNTER — Other Ambulatory Visit: Payer: Self-pay | Admitting: Cardiovascular Disease

## 2014-02-14 NOTE — Telephone Encounter (Signed)
Rx was sent to pharmacy electronically. 

## 2014-02-26 ENCOUNTER — Telehealth: Payer: Self-pay | Admitting: Cardiovascular Disease

## 2014-02-26 MED ORDER — RANOLAZINE ER 1000 MG PO TB12: 1000.0000 mg | ORAL_TABLET | Freq: Two times a day (BID) | ORAL | Status: DC

## 2014-02-26 NOTE — Telephone Encounter (Signed)
Medication Samples have been provided to the patient.  Drug name: Ranexa  Qty: 3 box/42 tab  LOT: [id number]  Exp.Date: 11/2016  Patient Notified they are at the front desk to be picked up  [place] J 4:02 PM 02/26/2014   Patient also made appointment for 1 year follow up

## 2014-02-26 NOTE — Telephone Encounter (Signed)
Pt called in wanting some samples of Ranexa 1000mg . Please call  Thanks

## 2014-03-12 ENCOUNTER — Other Ambulatory Visit: Payer: Self-pay | Admitting: Cardiovascular Disease

## 2014-03-15 ENCOUNTER — Other Ambulatory Visit: Payer: Self-pay | Admitting: *Deleted

## 2014-04-02 ENCOUNTER — Encounter: Payer: Self-pay | Admitting: Cardiovascular Disease

## 2014-04-02 ENCOUNTER — Ambulatory Visit (INDEPENDENT_AMBULATORY_CARE_PROVIDER_SITE_OTHER): Payer: Medicare HMO | Admitting: Cardiovascular Disease

## 2014-04-02 VITALS — BP 102/60 | HR 56 | Ht 70.0 in | Wt 196.6 lb

## 2014-04-02 DIAGNOSIS — E7849 Other hyperlipidemia: Secondary | ICD-10-CM

## 2014-04-02 DIAGNOSIS — E038 Other specified hypothyroidism: Secondary | ICD-10-CM

## 2014-04-02 DIAGNOSIS — Z79899 Other long term (current) drug therapy: Secondary | ICD-10-CM

## 2014-04-02 DIAGNOSIS — K219 Gastro-esophageal reflux disease without esophagitis: Secondary | ICD-10-CM

## 2014-04-02 DIAGNOSIS — I25810 Atherosclerosis of coronary artery bypass graft(s) without angina pectoris: Secondary | ICD-10-CM

## 2014-04-02 DIAGNOSIS — E784 Other hyperlipidemia: Secondary | ICD-10-CM

## 2014-04-02 DIAGNOSIS — E785 Hyperlipidemia, unspecified: Secondary | ICD-10-CM

## 2014-04-02 DIAGNOSIS — I10 Essential (primary) hypertension: Secondary | ICD-10-CM

## 2014-04-02 NOTE — Progress Notes (Signed)
Patient ID: Bradley Oconnor, male   DOB: Jun 13, 1940, 74 y.o.   MRN: [id number]     HPI: RAIFORD FETTERMAN, is a 74 y.o. male who presents to the office for a one-year follow-up cardiology evaluation.  Mr. Fray has established coronary disease dating back to 23 when he suffered an inferior wall myocardial infarction. At that time he underwent PTCA of a totally occluded RCA. In 1993 due to progressive CAD, he underwent CABG surgery with a LIMA to his LAD, vein graft sequentially to a diagonal and marginal vein graft to his PDA branch of his right carotid artery. In September 2002 a stent was placed the PLA of his RCA.  In June 2011, he suffered a non-ST segment elevation MI which was felt to be due to RCA graft occlusion which supplied the PDA and PLA vessel. The PDA was extensively collateralized now via the left circumflex territory. His native RCA was totally occluded at the mid level. His LIMA graft is widely patent as was the sequential graft to the diagonal marginal vessel. He has done well particularly with the addition of Ranexa titrated up to 1000 twice a day added to his medical regimen. He believes the Ranexa has made a huge difference in his anginal symptomatology. He is being fairly active. He is unaware of palpitations he denies presyncope or syncope.  Additional problems include GERD, hyperlipidemia, hypertension.  Over the past year, he admits to 3 short-lived episodes of some mild chest pain.  He feels that his CAD is stable.  His wife unfortunately has suffered multiple small strokes and she is now fairly incapacitated and he spends much of his time caring for her.  He denies dizziness.  He does have a history of hypertension and currently is on all taste 10 mg, Toprol-XL 125 mg, amlodipine 5 mg, in addition to his isosorbide 120 mg.  He denies bleeding on aspirin.  He has hypothyroidism on Synthroid replacement.  He takes HCTZ rarely for intermittent right ankle swelling.  Past  Medical History  Diagnosis Date  . GERD (gastroesophageal reflux disease)   . Hyperlipidemia   . Heart attack   . CAD (coronary artery disease)   . Hypertension 03/07/12    ECHO-WNL     08/12/11 Lexiscan MyoviewNo significant ischemia demonstrated Low risk scan There is a moderate sized dense scar in the LCX territoy unchanged from the prior study.. Post- stress EF is 40%.    Past Surgical History  Procedure Laterality Date  . Hernia repair    . Thyroid surgery      1/2 thyroid removed on right side  . Appendectomy    . Coronary artery bypass graft    . Cervical spine surgery      titanium plate in the back of neck    Allergies  Allergen Reactions  . Procardia [Nifedipine] Other (See Comments)    Lowers bp   . Phenergan [Promethazine Hcl] Nausea And Vomiting    Current Outpatient Prescriptions  Medication Sig Dispense Refill  . amLODipine (NORVASC) 5 MG tablet Take 1 tablet by mouth every day. 30 tablet 11  . aspirin 325 MG EC tablet Take 325 mg by mouth every evening.     Marland Kitchen CRESTOR 20 MG tablet TAKE ONE TABLET BY MOUTH IN THE EVENING 30 tablet 5  . fish oil-omega-3 fatty acids 1000 MG capsule Take 1 g by mouth daily.     . Garlic 9326 MG CAPS Take 1 capsule by mouth daily.     Marland Kitchen  hydrochlorothiazide (MICROZIDE) 12.5 MG capsule Take 12.5 mg by mouth as needed (fluid and edema.).    Marland Kitchen isosorbide mononitrate (IMDUR) 120 MG 24 hr tablet Take 1 tablet (120 mg total) by mouth daily. 30 tablet 11  . lansoprazole (PREVACID) 15 MG capsule Take 15 mg by mouth daily.    Marland Kitchen levothyroxine (SYNTHROID, LEVOTHROID) 100 MCG tablet TAKE ONE TABLET BY MOUTH IN THE MORNING BEFORE BREAKFAST 30 tablet 0  . metoprolol succinate (TOPROL-XL) 100 MG 24 hr tablet Take 1.25 tablets (125 mg total) by mouth daily. 54 tablet 10  . nitroGLYCERIN (NITROSTAT) 0.4 MG SL tablet Place 1 tablet (0.4 mg total) under the tongue every 5 (five) minutes as needed for chest pain. 25 tablet 3  . ondansetron (ZOFRAN ODT)  4 MG disintegrating tablet Take 1 tablet (4 mg total) by mouth every 8 (eight) hours as needed for nausea. 20 tablet 0  . ramipril (ALTACE) 10 MG capsule Take 1 capsule (10 mg total) by mouth daily. <please make appointment for refills> 30 capsule 1  . ranolazine (RANEXA) 1000 MG SR tablet Take 1 tablet (1,000 mg total) by mouth 2 (two) times daily. 42 tablet 0   No current facility-administered medications for this visit.    Socially he is married. He does try to do some exercise. He remains relatively active with yard work. Is no tobacco or alcohol use.  ROS General: Negative; No fevers, chills, or night sweats;  HEENT: Negative; No changes in vision or hearing, sinus congestion, difficulty swallowing Pulmonary: Negative; No cough, wheezing, shortness of breath, hemoptysis Cardiovascular: See HPI GI: Negative; No nausea, vomiting, diarrhea, or abdominal pain GU: Negative; No dysuria, hematuria, or difficulty voiding Musculoskeletal: Negative; no myalgias, joint pain, or weakness Hematologic/Oncology: Negative; no easy bruising, bleeding Endocrine: Negative; no heat/cold intolerance; no diabetes Neuro: Negative; no changes in balance, headaches Skin: Negative; No rashes or skin lesions Psychiatric: Negative; No behavioral problems, depression Sleep: Negative; No snoring, daytime sleepiness, hypersomnolence, bruxism, restless legs, hypnogognic hallucinations, no cataplexy Other comprehensive 14 point system review is negative.   PE BP 102/60 mmHg  Pulse 56  Ht 5\' 10"  (1.778 m)  Wt 196 lb 9.6 oz (89.177 kg)  BMI 28.21 kg/m2  Repeat blood pressure today by me was 160/70 supine and was 110/66 standing without significant increase in pulse rate. General: Alert, oriented, no distress.  Skin: normal turgor, no rashes HEENT: Normocephalic, atraumatic. Pupils round and reactive; sclera anicteric;no lid lag.  Nose without nasal septal hypertrophy Mouth/Parynx benign; Mallinpatti scale  3 Neck: No JVD, no carotid bruits with normal carotid upstroke Lungs: clear to ausculatation and percussion; no wheezing or rales Chest wall: No tenderness to palpation Heart: RRR, s1 s2 normal faint 1/6 systolic murmur; no diastolic murmur.  No rubs thrills or heaves Abdomen: soft, nontender; no hepatosplenomehaly, BS+; abdominal aorta nontender and not dilated by palpation. Back: No CVA tenderness Pulses 2+ Extremities: Trivial residual right ankle swelling;no clubbing cyanosis, Homan's sign negative  Neurologic: grossly nonfocal Psychological: Normal affect and mood  ECG (independently read by me): Sinus bradycardia 56 bpm.  Old inferior MI with Q waves and early transition, suggesting posterior wall involvement.  Nonspecific ST changes.  January 2015 ECG: Sinus rhythm at 58 beats per minute. Nonspecific T changes. Evidence for old inferior MI with Q waves in leads 3 and aVF.  LABS:  BMET    Component Value Date/Time   NA 138 03/31/2013 1510   K 4.7 03/31/2013 1510   CL 97 03/31/2013  1510   CO2 27 03/31/2013 1510   GLUCOSE 126* 03/31/2013 1510   BUN 25* 03/31/2013 1510   CREATININE 1.24 03/31/2013 1510   CREATININE 1.14 09/12/2012 0932   CALCIUM 9.9 03/31/2013 1510   GFRNONAA 56* 03/31/2013 1510   GFRAA 65* 03/31/2013 1510     Hepatic Function Panel     Component Value Date/Time   PROT 8.4* 03/31/2013 1510   ALBUMIN 4.1 03/31/2013 1510   AST 21 03/31/2013 1510   ALT 29 03/31/2013 1510   ALKPHOS 71 03/31/2013 1510   BILITOT 1.0 03/31/2013 1510     CBC    Component Value Date/Time   WBC 11.6* 03/31/2013 1510   WBC 12.8* 03/31/2013 1239   RBC 5.16 03/31/2013 1510   RBC 5.36 03/31/2013 1239   HGB 15.9 03/31/2013 1510   HGB 16.5 03/31/2013 1239   HCT 46.9 03/31/2013 1510   HCT 52.5 03/31/2013 1239   PLT 187 03/31/2013 1510   MCV 90.9 03/31/2013 1510   MCV 97.9* 03/31/2013 1239   MCH 30.8 03/31/2013 1510   MCH 30.8 03/31/2013 1239   MCHC 33.9 03/31/2013  1510   MCHC 31.4* 03/31/2013 1239   RDW 13.8 03/31/2013 1510   LYMPHSABS 1.7 03/31/2013 1510   MONOABS 1.0 03/31/2013 1510   EOSABS 0.0 03/31/2013 1510   BASOSABS 0.0 03/31/2013 1510     BNP No results found for: PROBNP  Lipid Panel     Component Value Date/Time   CHOL 144 09/12/2012 0932   TRIG 169* 09/12/2012 0932   HDL 38* 09/12/2012 0932   CHOLHDL 3.8 09/12/2012 0932   VLDL 34 09/12/2012 0932   LDLCALC 72 09/12/2012 0932     RADIOLOGY: No results found.    ASSESSMENT AND PLAN: Ms. Torrance is a 74 year old Caucasian male who suffered an  inferior wall myocardial infarction 24 years ago treated with initial PTCA. He is almost 23 status post CABG surgery. He has documented RCA graft occlusion with good left-to-right collaterals. He states that Ranexa has made a remarkable difference in his symptomatology to his tolerating his medications well. His blood pressure today is well controlled on combination therapy with amlodipine 5 mg, Toprol-XL 125 mg, and Altace 10 mg.  Is not having significant recurrent anginal symptomatology on the above regimen and also takes isosorbide 120 mg in addition to Raynaud's and 1000 mg twice a day.  Although his blood pressure was somewhat low when taken by the nurse, this was not abnormal on orthostatic assessment when rechecked by me.  He denies symptoms of dizziness.  He notes rare intermittent right ankle edema for which he takes HCTZ  12.5 mg on a when necessary basis.  He does have hyperlipidemia and is on Crestor 20 mg which he is tolerating. He does have GERD but this currently is controlled. He also has a history of hypothyroidism on Synthroid replacement 100 mcg. his last year, he has lost weight.  At that time he weighed 202 and was considered "mildly obese, "but today his body mass index is 28.2 with a weight of 196.  He is not had laboratory in a year.  I will check a complete set of blood work.  I will notify him regarding the results.   Adjustments will be made to his medical regimen if necessary.   Time spent: 25 minutes   Troy Sine, MD, [facility]  04/02/2014 11:26 AM

## 2014-04-02 NOTE — Patient Instructions (Signed)
Your physician recommends that you return for lab work fasting.   Your physician wants you to follow-up in: 1 year or sooner if needed. You will receive a reminder letter in the mail two months in advance. If you don't receive a letter, please call our office to schedule the follow-up appointment.   

## 2014-04-06 LAB — LIPID PANEL
CHOL/HDL RATIO: 3.4 ratio
Cholesterol: 134 mg/dL (ref 0–200)
HDL: 39 mg/dL — AB (ref >39)
LDL CALC: 66 mg/dL (ref 0–99)
TRIGLYCERIDES: 147 mg/dL (ref <150)
VLDL: 29 mg/dL (ref 0–40)

## 2014-04-06 LAB — COMPREHENSIVE METABOLIC PANEL
ALBUMIN: 3.8 g/dL (ref 3.5–5.2)
ALK PHOS: 58 U/L (ref 39–117)
ALT: 21 U/L (ref 0–53)
AST: 19 U/L (ref 0–37)
BUN: 16 mg/dL (ref 6–23)
CO2: 28 meq/L (ref 19–32)
Calcium: 9 mg/dL (ref 8.4–10.5)
Chloride: 101 mEq/L (ref 96–112)
Creat: 0.97 mg/dL (ref 0.50–1.35)
Glucose, Bld: 105 mg/dL — ABNORMAL HIGH (ref 70–99)
Potassium: 4.2 mEq/L (ref 3.5–5.3)
SODIUM: 136 meq/L (ref 135–145)
Total Bilirubin: 0.8 mg/dL (ref 0.2–1.2)
Total Protein: 6.9 g/dL (ref 6.0–8.3)

## 2014-04-06 LAB — CBC
HEMATOCRIT: 42.6 % (ref 39.0–52.0)
Hemoglobin: 14.1 g/dL (ref 13.0–17.0)
MCH: 29.9 pg (ref 26.0–34.0)
MCHC: 33.1 g/dL (ref 30.0–36.0)
MCV: 90.4 fL (ref 78.0–100.0)
MPV: 8.6 fL (ref 8.6–12.4)
Platelets: 173 10*3/uL (ref 150–400)
RBC: 4.71 MIL/uL (ref 4.22–5.81)
RDW: 14.2 % (ref 11.5–15.5)
WBC: 7.6 10*3/uL (ref 4.0–10.5)

## 2014-04-07 LAB — TSH: TSH: 0.42 u[IU]/mL (ref 0.350–4.500)

## 2014-04-09 ENCOUNTER — Telehealth: Payer: Self-pay | Admitting: Cardiovascular Disease

## 2014-04-09 MED ORDER — RANOLAZINE ER 1000 MG PO TB12: 1000.0000 mg | ORAL_TABLET | Freq: Two times a day (BID) | ORAL | Status: DC

## 2014-04-09 NOTE — Telephone Encounter (Signed)
Medication Samples have been provided to the patient.  Drug name: Ranexa  Qty: 4 box  LOT: [id number]  Exp.Date: 9/18  The patient has been instructed regarding the correct time, dose, and frequency of taking this medication, including desired effects and most common side effects.   Jozelyn Kuwahara J 12:24 PM 04/09/2014

## 2014-04-09 NOTE — Telephone Encounter (Signed)
Pt called in requesting samples of Ranexa 1000mg . Please call  Thanks

## 2014-04-10 ENCOUNTER — Encounter: Payer: Managed Care, Other (non HMO) | Admitting: Emergency Medicine

## 2014-04-13 ENCOUNTER — Other Ambulatory Visit: Payer: Self-pay | Admitting: Cardiovascular Disease

## 2014-04-17 ENCOUNTER — Encounter: Payer: Self-pay | Admitting: Emergency Medicine

## 2014-04-17 ENCOUNTER — Ambulatory Visit (INDEPENDENT_AMBULATORY_CARE_PROVIDER_SITE_OTHER): Payer: PPO | Admitting: Emergency Medicine

## 2014-04-17 VITALS — BP 102/62 | HR 59 | Temp 97.9°F | Resp 16 | Ht 70.0 in | Wt 195.0 lb

## 2014-04-17 DIAGNOSIS — Z23 Encounter for immunization: Secondary | ICD-10-CM

## 2014-04-17 DIAGNOSIS — L989 Disorder of the skin and subcutaneous tissue, unspecified: Secondary | ICD-10-CM

## 2014-04-17 DIAGNOSIS — Z0000 Encounter for general adult medical examination without abnormal findings: Secondary | ICD-10-CM

## 2014-04-17 DIAGNOSIS — Z125 Encounter for screening for malignant neoplasm of prostate: Secondary | ICD-10-CM

## 2014-04-17 LAB — COMPLETE METABOLIC PANEL WITH GFR
ALBUMIN: 3.9 g/dL (ref 3.5–5.2)
ALK PHOS: 56 U/L (ref 39–117)
ALT: 18 U/L (ref 0–53)
AST: 17 U/L (ref 0–37)
BUN: 29 mg/dL — ABNORMAL HIGH (ref 6–23)
CHLORIDE: 102 meq/L (ref 96–112)
CO2: 27 mEq/L (ref 19–32)
Calcium: 9.1 mg/dL (ref 8.4–10.5)
Creat: 1.27 mg/dL (ref 0.50–1.35)
GFR, Est African American: 64 mL/min
GFR, Est Non African American: 55 mL/min — ABNORMAL LOW
Glucose, Bld: 93 mg/dL (ref 70–99)
Potassium: 4.3 mEq/L (ref 3.5–5.3)
Sodium: 138 mEq/L (ref 135–145)
Total Bilirubin: 1.1 mg/dL (ref 0.2–1.2)
Total Protein: 7.7 g/dL (ref 6.0–8.3)

## 2014-04-17 LAB — CBC WITH DIFFERENTIAL/PLATELET
BASOS PCT: 0 % (ref 0–1)
Basophils Absolute: 0 10*3/uL (ref 0.0–0.1)
EOS ABS: 0.2 10*3/uL (ref 0.0–0.7)
Eosinophils Relative: 2 % (ref 0–5)
HCT: 42.9 % (ref 39.0–52.0)
Hemoglobin: 14.4 g/dL (ref 13.0–17.0)
Lymphocytes Relative: 33 % (ref 12–46)
Lymphs Abs: 2.6 10*3/uL (ref 0.7–4.0)
MCH: 30.1 pg (ref 26.0–34.0)
MCHC: 33.6 g/dL (ref 30.0–36.0)
MCV: 89.7 fL (ref 78.0–100.0)
MONOS PCT: 10 % (ref 3–12)
MPV: 8.5 fL — AB (ref 8.6–12.4)
Monocytes Absolute: 0.8 10*3/uL (ref 0.1–1.0)
NEUTROS ABS: 4.3 10*3/uL (ref 1.7–7.7)
NEUTROS PCT: 55 % (ref 43–77)
Platelets: 194 10*3/uL (ref 150–400)
RBC: 4.78 MIL/uL (ref 4.22–5.81)
RDW: 13.9 % (ref 11.5–15.5)
WBC: 7.9 10*3/uL (ref 4.0–10.5)

## 2014-04-17 LAB — POCT URINALYSIS DIPSTICK
Blood, UA: NEGATIVE
GLUCOSE UA: NEGATIVE
LEUKOCYTES UA: NEGATIVE
Nitrite, UA: NEGATIVE
PH UA: 5
Protein, UA: 30
Spec Grav, UA: >=1.03
Urobilinogen, UA: 0.2

## 2014-04-17 LAB — LIPID PANEL
CHOLESTEROL: 145 mg/dL (ref 0–200)
HDL: 38 mg/dL — AB (ref >39)
LDL CALC: 66 mg/dL (ref 0–99)
Total CHOL/HDL Ratio: 3.8 Ratio
Triglycerides: 203 mg/dL — ABNORMAL HIGH (ref <150)
VLDL: 41 mg/dL — ABNORMAL HIGH (ref 0–40)

## 2014-04-17 NOTE — Progress Notes (Addendum)
Subjective:  This chart was scribed for Bradley Queen, MD by Donato Schultz, Medical Scribe. This patient was seen in Room 22 and the patient's care was started at 2:55 PM.   Patient ID: Bradley Oconnor, male    DOB: 1940/05/17, 74 y.o.   MRN: [id number]  HPI HPI Comments: Bradley Oconnor is a 74 y.o. male who presents to the [facility] for an annual exam.  He is complaining of a rough patch of skin on the left side of his neck.  He sees his cardiologist Dr. Claiborne Billings annual and he saw him a week ago.  He saw his Dr. Rupert Stacks at [facility] this year.  Dr. Jeffie Pollock monitors his prostate.  His last colonoscopy was 10 years ago and it is still not UTD.  He has received a shingles and flu vaccines.    He does not get as much exercise as he used to.  He states that his wife's health has caused him stress and he cares for her full time.  He believes that his wife had a stroke 2 months ago but is unsure.  He states that he noticed the left side of her mouth drooping but states that it has gradually resolved.  His wife is not able to ambulate on her own and he does not have a lift chair at home.  There is a woman that comes by his home to help assist with taking care of his wife.  He does not have any family in the area.     Patient Active Problem List   Diagnosis Date Noted  . GERD (gastroesophageal reflux disease) 04/10/2013  . HTN (hypertension) 04/10/2013  . CAD (coronary artery disease) of artery bypass graft 10/04/2012  . Hypothyroidism 10/04/2012  . Hyperlipidemia with target LDL less than 70 10/04/2012  . Kidney stones 01/27/2012  . Gallstones-symptomatic 01/27/2012  . Hx of appendectomy-history of ruptured requiring ileocecectomy (~1994) 01/27/2012   Past Medical History  Diagnosis Date  . GERD (gastroesophageal reflux disease)   . Hyperlipidemia   . Heart attack   . CAD (coronary artery disease)   . Hypertension 03/07/12    ECHO-WNL     08/12/11 Lexiscan  MyoviewNo significant ischemia demonstrated Low risk scan There is a moderate sized dense scar in the LCX territoy unchanged from the prior study.. Post- stress EF is 40%.   Past Surgical History  Procedure Laterality Date  . Hernia repair    . Thyroid surgery      1/2 thyroid removed on right side  . Appendectomy    . Coronary artery bypass graft    . Cervical spine surgery      titanium plate in the back of neck   Allergies  Allergen Reactions  . Procardia [Nifedipine] Other (See Comments)    Lowers bp   . Phenergan [Promethazine Hcl] Nausea And Vomiting   Prior to Admission medications   Medication Sig Start Date End Date Taking? Authorizing Provider  amLODipine (NORVASC) 5 MG tablet TAKE ONE TABLET BY MOUTH ONCE DAILY 04/13/14   Troy Sine, MD  aspirin 325 MG EC tablet Take 325 mg by mouth every evening.     Historical Provider, MD  CRESTOR 20 MG tablet TAKE ONE TABLET BY MOUTH IN THE EVENING 04/13/14   Troy Sine, MD  fish oil-omega-3 fatty acids 1000 MG capsule Take 1 g by mouth daily.     Historical Provider, MD  Garlic 7416 MG CAPS Take  1 capsule by mouth daily.     Historical Provider, MD  hydrochlorothiazide (MICROZIDE) 12.5 MG capsule Take 12.5 mg by mouth as needed (fluid and edema.). 10/04/12   Troy Sine, MD  isosorbide mononitrate (IMDUR) 120 MG 24 hr tablet TAKE ONE TABLET BY MOUTH ONCE DAILY 04/13/14   Troy Sine, MD  lansoprazole (PREVACID) 15 MG capsule Take 15 mg by mouth daily.    Historical Provider, MD  levothyroxine (SYNTHROID, LEVOTHROID) 100 MCG tablet TAKE ONE TABLET BY MOUTH ONCE DAILY IN THE MORNING BEFORE BREAKFAST 04/13/14   Troy Sine, MD  metoprolol succinate (TOPROL-XL) 100 MG 24 hr tablet Take 1.25 tablets (125 mg total) by mouth daily. 06/19/13   Troy Sine, MD  nitroGLYCERIN (NITROSTAT) 0.4 MG SL tablet Place 1 tablet (0.4 mg total) under the tongue every 5 (five) minutes as needed for chest pain. 04/10/13   Troy Sine, MD    ondansetron (ZOFRAN ODT) 4 MG disintegrating tablet Take 1 tablet (4 mg total) by mouth every 8 (eight) hours as needed for nausea. 03/31/13   Tanna Furry, MD  ramipril (ALTACE) 10 MG capsule Take one tablet daily 04/13/14   Troy Sine, MD  ranolazine (RANEXA) 1000 MG SR tablet Take 1 tablet (1,000 mg total) by mouth 2 (two) times daily. 04/09/14   Troy Sine, MD   History   Social History  . Marital Status: Married    Spouse Name: N/A    Number of Children: N/A  . Years of Education: N/A   Occupational History  . Not on file.   Social History Main Topics  . Smoking status: Never Smoker   . Smokeless tobacco: Never Used  . Alcohol Use: No  . Drug Use: No  . Sexual Activity: Not on file   Other Topics Concern  . Not on file   Social History Narrative     Review of Systems  All other systems reviewed and are negative.    Objective:  Physical Exam  Constitutional: He is oriented to person, place, and time. He appears well-developed and well-nourished.  HENT:  Head: Normocephalic and atraumatic.  Right Ear: External ear normal.  Left Ear: External ear normal.  Mouth/Throat: Oropharynx is clear and moist. No oropharyngeal exudate.  Eyes: Conjunctivae and EOM are normal. Pupils are equal, round, and reactive to light. Right eye exhibits no discharge. Left eye exhibits no discharge. No scleral icterus.  Neck: Normal range of motion. Neck supple. Carotid bruit is not present. No thyromegaly present.  Cardiovascular: Normal rate, regular rhythm and normal heart sounds.  Exam reveals no gallop and no friction rub.   No murmur heard. Pulmonary/Chest: Effort normal and breath sounds normal. No respiratory distress. He has no wheezes. He has no rales.  Abdominal: Soft. There is no tenderness.  Large diastasis recti.   Musculoskeletal: Normal range of motion.  Lymphadenopathy:    He has no cervical adenopathy.  Neurological: He is alert and oriented to person, place, and  time.  Skin: Skin is warm and dry.  1cmx1cm raised, scaly skin lesion on the left posterior cervical neck.  Psychiatric: He has a normal mood and affect. His behavior is normal.  Nursing note and vitals reviewed.    BP 102/62 mmHg  Pulse 59  Temp(Src) 97.9 F (36.6 C) (Oral)  Resp 16  Ht 5\' 10"  (1.778 m)  Wt 195 lb (88.451 kg)  BMI 27.98 kg/m2  SpO2 95% Assessment & Plan:  Patient has  regular follow-ups with his cardiologist and urologist.  Referral made to dermatology for a lesion on his posterior neck.  He needs more assistance caring for his wife.  Pneumonia-23 vaccine given today.  He physically will not be able to continue his current level of care for his wife. He has stable angina and follows up with the cardiologist regularly.I personally performed the services described in this documentation, which was scribed in my presence. The recorded information has been reviewed and is accurate.

## 2014-04-18 LAB — PSA, MEDICARE: PSA: 1.33 ng/mL (ref <=4.00)

## 2014-04-22 ENCOUNTER — Encounter: Payer: Self-pay | Admitting: *Deleted

## 2014-05-11 ENCOUNTER — Telehealth: Payer: Self-pay

## 2014-05-11 NOTE — Telephone Encounter (Signed)
Pt called to return call. pls advise.

## 2014-05-12 NOTE — Telephone Encounter (Signed)
Unable to find where we called him. LMOM of this info and to CB if he has any questions/concerns

## 2014-06-06 ENCOUNTER — Other Ambulatory Visit: Payer: Self-pay | Admitting: Cardiovascular Disease

## 2014-06-06 NOTE — Telephone Encounter (Signed)
Medication samples have been provided to the patient.  Drug name: ranexa 1000  Qty: 56  LOT: [id number]  Exp.Date: 03/2017  Samples left at front desk for patient pick-up. Patient notified.  Fidel Levy 9:45 AM 06/06/2014

## 2014-06-06 NOTE — Telephone Encounter (Signed)
Pt would like some samples of Ranexa please.

## 2014-07-12 ENCOUNTER — Other Ambulatory Visit: Payer: Self-pay | Admitting: Cardiovascular Disease

## 2014-07-12 NOTE — Telephone Encounter (Signed)
E SENT TO PHARMACY 

## 2014-07-13 ENCOUNTER — Other Ambulatory Visit: Payer: Self-pay

## 2014-07-13 MED ORDER — HYDROCHLOROTHIAZIDE 12.5 MG PO CAPS: 12.5000 mg | ORAL_CAPSULE | ORAL | Status: DC | PRN

## 2014-07-13 NOTE — Telephone Encounter (Signed)
Rx(s) sent to pharmacy electronically.  

## 2014-08-03 ENCOUNTER — Telehealth: Payer: Self-pay | Admitting: Cardiovascular Disease

## 2014-08-03 MED ORDER — RANOLAZINE ER 1000 MG PO TB12
1000.0000 mg | ORAL_TABLET | Freq: Two times a day (BID) | ORAL | Status: DC
Start: 2014-08-03 — End: 2015-05-17

## 2014-08-03 NOTE — Telephone Encounter (Signed)
Py would like some samples of Ranexa please.

## 2014-08-03 NOTE — Telephone Encounter (Signed)
Samples of this drug were given to the patient, quantity 4 box/56tab, Lot Number [id number]  Patient called and notified

## 2014-09-27 ENCOUNTER — Telehealth: Payer: Self-pay | Admitting: Cardiovascular Disease

## 2014-09-27 NOTE — Telephone Encounter (Signed)
Medication samples have been provided to the patient.  Drug name: ranexa 1000  Qty: 56  LOT: [id number] (#28)  Exp.Date: 09/2014                          [id number] (#28)        06/2017 Samples left at front desk for patient pick-up. Patient notified.  Sheral Apley M 4:56 PM 09/27/2014

## 2014-09-27 NOTE — Telephone Encounter (Signed)
Patient calling the office for samples of medication:1.  What medication and dosage are you requesting samples for?Ranexa  2.  Are you currently out of this medication?2 days lefts 3. Are you requesting samples to get you through until a mail order prescription arrives?no

## 2014-10-09 ENCOUNTER — Other Ambulatory Visit: Payer: Self-pay | Admitting: Cardiovascular Disease

## 2014-10-09 NOTE — Telephone Encounter (Signed)
REFILL 

## 2014-10-10 NOTE — Telephone Encounter (Signed)
Rx(s) sent to pharmacy electronically.  

## 2014-10-15 ENCOUNTER — Other Ambulatory Visit: Payer: Self-pay | Admitting: Cardiovascular Disease

## 2014-11-26 ENCOUNTER — Telehealth: Payer: Self-pay | Admitting: Cardiovascular Disease

## 2014-11-26 NOTE — Telephone Encounter (Signed)
Medication samples have been provided to the patient. Drug name: Ranexa 1000 mg Qty: 56 tabs LOT: [id number] EXp.Date: 06/2017 Samples left at front desk for patient pick-up. Patient notified.  Alishea Beaudin, Chelley 11:04 AM 11/26/2014

## 2014-11-26 NOTE — Telephone Encounter (Signed)
Patient calling the office for samples of medication:   1.  What medication and dosage are you requesting samples for? Ranexa 1000mg    2.  Are you currently out of this medication? He has 2 pills left   3. Are you requesting samples to get you through until a mail order prescription arrives? No

## 2015-01-01 ENCOUNTER — Encounter: Payer: Self-pay | Admitting: Emergency Medicine

## 2015-01-14 ENCOUNTER — Other Ambulatory Visit: Payer: Self-pay | Admitting: Cardiovascular Disease

## 2015-01-14 NOTE — Telephone Encounter (Signed)
Patient calling the office for samples of medication:   1.  What medication and dosage are you requesting samples for? Ranexa 1000mg   2.  Are you currently out of this medication? Yes   3. Are you requesting samples to get you through until you receive your prescription? No

## 2015-01-14 NOTE — Telephone Encounter (Signed)
Medication samples have been provided to the patient.  Drug name: Ranexa 1000 mg Qty: 56 tabs LOT: [id number] Exp.Date: 10/2017  Samples left at front desk for patient pick-up. Patient notified.  Truitt, Chelley 4:15 PM 01/14/2015

## 2015-01-17 ENCOUNTER — Ambulatory Visit (INDEPENDENT_AMBULATORY_CARE_PROVIDER_SITE_OTHER): Payer: PPO

## 2015-01-17 DIAGNOSIS — Z23 Encounter for immunization: Secondary | ICD-10-CM | POA: Diagnosis not present

## 2015-01-25 ENCOUNTER — Other Ambulatory Visit: Payer: Self-pay | Admitting: Cardiovascular Disease

## 2015-03-04 ENCOUNTER — Other Ambulatory Visit: Payer: Self-pay | Admitting: Cardiovascular Disease

## 2015-03-04 NOTE — Telephone Encounter (Signed)
REFILL 

## 2015-03-19 ENCOUNTER — Telehealth: Payer: Self-pay | Admitting: Cardiovascular Disease

## 2015-03-19 NOTE — Telephone Encounter (Signed)
Patient calling the office for samples of medication: ° ° °1.  What medication and dosage are you requesting samples for?Ranexa ° °2.  Are you currently out of this medication? No,3 left ° ° ° °

## 2015-03-20 NOTE — Telephone Encounter (Signed)
Patient called Ranexa 1000 mg samples left at front desk of [facility] office.

## 2015-04-05 ENCOUNTER — Encounter: Payer: Self-pay | Admitting: Cardiovascular Disease

## 2015-04-05 ENCOUNTER — Ambulatory Visit (INDEPENDENT_AMBULATORY_CARE_PROVIDER_SITE_OTHER): Payer: PPO | Admitting: Cardiovascular Disease

## 2015-04-05 VITALS — BP 116/70 | HR 61 | Ht 68.0 in | Wt 199.6 lb

## 2015-04-05 DIAGNOSIS — E0590 Thyrotoxicosis, unspecified without thyrotoxic crisis or storm: Secondary | ICD-10-CM

## 2015-04-05 DIAGNOSIS — I25810 Atherosclerosis of coronary artery bypass graft(s) without angina pectoris: Secondary | ICD-10-CM

## 2015-04-05 DIAGNOSIS — E785 Hyperlipidemia, unspecified: Secondary | ICD-10-CM | POA: Diagnosis not present

## 2015-04-05 DIAGNOSIS — I10 Essential (primary) hypertension: Secondary | ICD-10-CM | POA: Diagnosis not present

## 2015-04-05 NOTE — Patient Instructions (Signed)
Your physician recommends that you return for fasting lab work .  Your physician wants you to follow-up in: 1 year or sooner if needed. You will receive a reminder letter in the mail two months in advance. If you don't receive a letter, please call our office to schedule the follow-up appointment.  If you need a refill on your cardiac medications before your next appointment, please call your pharmacy.

## 2015-04-05 NOTE — Progress Notes (Signed)
Patient ID: Bradley Oconnor, male   DOB: 1941-01-07, 75 y.o.   MRN: [id number]     HPI: Bradley Oconnor, is a 75 y.o. male who presents to the office for a one-year follow-up cardiology evaluation.  [name] has established CAD dating back to 1992 when he suffered an inferior wall myocardial infarction and underwent PTCA of a totally occluded RCA. In 1993 due to progressive CAD, he underwent CABG surgery with a LIMA to his LAD, vein graft sequentially to a diagonal and marginal vein graft to his PDA branch of his right carotid artery. In September 2002 a stent was placed the PLA of his RCA.  In June 2011, he suffered a non-ST segment elevation MI which was felt to be due to RCA graft occlusion which supplied the PDA and PLA vessel. The PDA was extensively collateralized now via the left circumflex territory. His native RCA was totally occluded at the mid level. His LIMA graft is widely patent as was the sequential graft to the diagonal marginal vessel. He has done well particularly with the addition of Ranexa titrated up to 1000 twice a day added to his medical regimen. He believes the Ranexa has made a huge difference in his anginal symptomatology. He is unaware of palpitations he denies presyncope or syncope.  Additional problems include GERD, hyperlipidemia, hypertension.  In 2015, he experienced 3 short-lived episodes of some mild chest pain.  He feels that his CAD is stable.  His wife unfortunately has suffered multiple small strokes and she is now fairly incapacitated and he spends much of his time caring for her.  As result, he has not been as active as he had in the past.  Over the past year, he denies recurrent anginal symptoms. He denies dizziness.  He he has been on ramipril 10 mg, amlodipine 5 mg, HCTZ 12.5 mg as needed for edema, in addition to Toprol-XL 125 mg and isosorbide 120 mg.  He denies bleeding on aspirin.  He has been taking 325 mg aspirin.  He also has been on ranolazine 1000 g  twice a day.  He is was recently switched by his insurance company to generic Crestor 20 mg daily, which he has been on for 30 days and also takes over-the-counter fish oil. He has hypothyroidism on Synthroid replacement and Prevacid for GERD.  He presents for one-year evaluation  Past Medical History  Diagnosis Date  . GERD (gastroesophageal reflux disease)   . Hyperlipidemia   . Heart attack   . CAD (coronary artery disease)   . Hypertension 03/07/12    ECHO-WNL     08/12/11 Lexiscan MyoviewNo significant ischemia demonstrated Low risk scan There is a moderate sized dense scar in the LCX territoy unchanged from the prior study.. Post- stress EF is 40%.  . Blood transfusion without reported diagnosis   . Thyroid disease     Past Surgical History  Procedure Laterality Date  . Hernia repair    . Thyroid surgery      1/2 thyroid removed on right side  . Appendectomy    . Coronary artery bypass graft    . Cervical spine surgery      titanium plate in the back of neck  . Small intestine surgery      Allergies  Allergen Reactions  . Procardia [Nifedipine] Other (See Comments)    Lowers bp   . Phenergan [Promethazine Hcl] Nausea And Vomiting    Current Outpatient Prescriptions  Medication Sig Dispense Refill  .  amLODipine (NORVASC) 5 MG tablet TAKE ONE TABLET BY MOUTH ONCE DAILY 30 tablet 5  . aspirin 325 MG EC tablet Take 325 mg by mouth every evening.     Marland Kitchen CRESTOR 20 MG tablet TAKE ONE TABLET BY MOUTH ONCE DAILY IN THE EVENING 30 tablet 5  . fish oil-omega-3 fatty acids 1000 MG capsule Take 1 g by mouth daily.     . Garlic 4627 MG CAPS Take 1 capsule by mouth daily.     . hydrochlorothiazide (MICROZIDE) 12.5 MG capsule Take 1 capsule (12.5 mg total) by mouth as needed (fluid and edema). 90 capsule 1  . isosorbide mononitrate (IMDUR) 120 MG 24 hr tablet TAKE ONE TABLET BY MOUTH ONCE DAILY 30 tablet 6  . lansoprazole (PREVACID) 15 MG capsule Take 15 mg by mouth daily.    Marland Kitchen  levothyroxine (SYNTHROID, LEVOTHROID) 100 MCG tablet TAKE ONE TABLET BY MOUTH ONCE DAILY IN THE MORNING BEFORE BREAKFAST 30 tablet 5  . metoprolol succinate (TOPROL-XL) 100 MG 24 hr tablet TAKE ONE AND ONE-FOURTH TABLET BY MOUTH ONCE DAILY 45 tablet 0  . NITROSTAT 0.4 MG SL tablet DISSOLVE ONE TABLET UNDER THE TONGUE EVERY 5 MINUTES AS NEEDED FOR CHEST PAIN.  DO NOT EXCEED A TOTAL OF 3 DOSES IN 15 MINUTES 25 tablet 5  . ramipril (ALTACE) 10 MG capsule TAKE ONE CAPSULE BY MOUTH ONCE DAILY 30 capsule 6  . ranolazine (RANEXA) 1000 MG SR tablet Take 1 tablet (1,000 mg total) by mouth 2 (two) times daily. 56 tablet 0   No current facility-administered medications for this visit.    Socially he is married. He does try to do some exercise. He remains relatively active with yard work. Is no tobacco or alcohol use.  ROS General: Negative; No fevers, chills, or night sweats;  HEENT: Negative; No changes in vision or hearing, sinus congestion, difficulty swallowing Pulmonary: Negative; No cough, wheezing, shortness of breath, hemoptysis Cardiovascular: See HPI GI: Negative; No nausea, vomiting, diarrhea, or abdominal pain GU: Negative; No dysuria, hematuria, or difficulty voiding Musculoskeletal: Negative; no myalgias, joint pain, or weakness Hematologic/Oncology: Negative; no easy bruising, bleeding Endocrine: Negative; no heat/cold intolerance; no diabetes Neuro: Negative; no changes in balance, headaches Skin: Negative; No rashes or skin lesions Psychiatric: Negative; No behavioral problems, depression Sleep: Negative; No snoring, daytime sleepiness, hypersomnolence, bruxism, restless legs, hypnogognic hallucinations, no cataplexy Other comprehensive 14 point system review is negative.   PE BP 116/70 mmHg  Pulse 61  Ht _0  (1.727 m)  Wt 199 lb 9.6 oz (90.538 kg)  BMI 30.36 kg/m2   Wt Readings from Last 3 Encounters:  04/05/15 199 lb 9.6 oz (90.538 kg)  04/17/14 195 lb (88.451 kg)    04/02/14 196 lb 9.6 oz (89.177 kg)   General: Alert, oriented, no distress.  Skin: normal turgor, no rashes HEENT: Normocephalic, atraumatic. Pupils round and reactive; sclera anicteric;no lid lag.  Nose without nasal septal hypertrophy Mouth/Parynx benign; Mallinpatti scale 3 Neck: No JVD, no carotid bruits with normal carotid upstroke Lungs: clear to ausculatation and percussion; no wheezing or rales Chest wall: No tenderness to palpation Heart: RRR, s1 s2 normal faint 1/6 systolic murmur; no diastolic murmur.  No rubs thrills or heaves Abdomen: soft, nontender; no hepatosplenomehaly, BS+; abdominal aorta nontender and not dilated by palpation. Back: No CVA tenderness Pulses 2+ Extremities: Trivial ankle swelling;no clubbing cyanosis, Homan's sign negative  Neurologic: grossly nonfocal Psychological: Normal affect and mood  ECG (independently read by me): Normal sinus rhythm  at 61 bpm.  LVH by voltage.  Old inferior MI with inferior Q waves.  January 2016 ECG (independently read by me): Sinus bradycardia 56 bpm.  Old inferior MI with Q waves and early transition, suggesting posterior wall involvement.  Nonspecific ST changes.  January 2015 ECG: Sinus rhythm at 58 beats per minute. Nonspecific T changes. Evidence for old inferior MI with Q waves in leads 3 and aVF.  LABS: BMP Latest Ref Rng 04/17/2014 04/06/2014 03/31/2013  Glucose 70 - 99 mg/dL 93 105(H) 126(H)  BUN 6 - 23 mg/dL 29(H) 16 25(H)  Creatinine 0.50 - 1.35 mg/dL 1.27 0.97 1.24  Sodium 135 - 145 mEq/L 138 136 138  Potassium 3.5 - 5.3 mEq/L 4.3 4.2 4.7  Chloride 96 - 112 mEq/L 102 101 97  CO2 19 - 32 mEq/L _0 Calcium 8.4 - 10.5 mg/dL 9.1 9.0 9.9   Hepatic Function Latest Ref Rng 04/17/2014 04/06/2014 03/31/2013  Total Protein 6.0 - 8.3 g/dL 7.7 6.9 8.4(H)  Albumin 3.5 - 5.2 g/dL 3.9 3.8 4.1  AST 0 - 37 U/L _1 ALT 0 - 53 U/L _2 Alk Phosphatase 39 - 117 U/L 56 58 71  Total Bilirubin 0.2 - 1.2 mg/dL 1.1 0.8  1.0   CBC Latest Ref Rng 04/17/2014 04/06/2014 03/31/2013  WBC 4.0 - 10.5 K/uL 7.9 7.6 11.6(H)  Hemoglobin 13.0 - 17.0 g/dL 14.4 14.1 15.9  Hematocrit 39.0 - 52.0 % 42.9 42.6 46.9  Platelets 150 - 400 K/uL 194 173 187   Lab Results  Component Value Date   MCV 89.7 04/17/2014   MCV 90.4 04/06/2014   MCV 90.9 03/31/2013   No results found for: HGBA1C  Lab Results  Component Value Date   TSH 0.420 04/06/2014   Lipid Panel     Component Value Date/Time   CHOL 145 04/17/2014 1510   TRIG 203* 04/17/2014 1510   HDL 38* 04/17/2014 1510   CHOLHDL 3.8 04/17/2014 1510   VLDL 41* 04/17/2014 1510   LDLCALC 66 04/17/2014 1510    RADIOLOGY: No results found.    ASSESSMENT AND PLAN: Ms. Satter is a 75 year old Caucasian male who suffered an  inferior wall myocardial infarction 25 years ago treated with initial PTCA. He is almost 24 status post CABG surgery. He has documented RCA graft occlusion with good left-to-right collaterals. He states that Ranexa has made a remarkable difference in his symptomatology to his tolerating his medications well. His blood pressure today is well controlled on combination therapy with amlodipine 5 mg, Toprol-XL 125 mg, and Altace 10 mg.  He has not had recurrent anginal symptomatology on the above regimen and also takes isosorbide 120 mg in addition to Ranexa 1000 mg twice a day.  His weight is fairly stable, although there was a 3 per awakening from one year ago.  He is not taking his HCTZ in several days and does have trace ankle edema bilaterally today.  He has not been as active and has not been exercising as regularly since he's been caring for his wife who is suffered multiple strokes and is partially paralyzed.  He was recently started on rosuvastatin 20 mg in place of training Crestor.  Repeat blood work will be obtained in the fasting state on his current therapy.  Adjustments to his medical regimen will be made if necessary.  He sees Dr. Arlyss Queen for  primary care.  As long as he remains stable I will see him  in one year for cardiology reevaluation.  Time spent: 25 minutes  Troy Sine, MD, [facility]  04/05/2015 2:57 PM

## 2015-04-13 ENCOUNTER — Other Ambulatory Visit: Payer: Self-pay | Admitting: Cardiovascular Disease

## 2015-04-15 NOTE — Telephone Encounter (Signed)
Rx request sent to pharmacy.  

## 2015-04-17 DIAGNOSIS — I10 Essential (primary) hypertension: Secondary | ICD-10-CM | POA: Diagnosis not present

## 2015-04-17 DIAGNOSIS — E785 Hyperlipidemia, unspecified: Secondary | ICD-10-CM | POA: Diagnosis not present

## 2015-04-17 DIAGNOSIS — E0590 Thyrotoxicosis, unspecified without thyrotoxic crisis or storm: Secondary | ICD-10-CM | POA: Diagnosis not present

## 2015-04-17 LAB — CBC
HCT: 40 % (ref 39.0–52.0)
Hemoglobin: 13.5 g/dL (ref 13.0–17.0)
MCH: 31.1 pg (ref 26.0–34.0)
MCHC: 33.8 g/dL (ref 30.0–36.0)
MCV: 92.2 fL (ref 78.0–100.0)
MPV: 8.5 fL — AB (ref 8.6–12.4)
PLATELETS: 147 10*3/uL — AB (ref 150–400)
RBC: 4.34 MIL/uL (ref 4.22–5.81)
RDW: 14.1 % (ref 11.5–15.5)
WBC: 6.4 10*3/uL (ref 4.0–10.5)

## 2015-04-18 LAB — COMPREHENSIVE METABOLIC PANEL
ALK PHOS: 47 U/L (ref 40–115)
ALT: 20 U/L (ref 9–46)
AST: 16 U/L (ref 10–35)
Albumin: 3.7 g/dL (ref 3.6–5.1)
BILIRUBIN TOTAL: 0.8 mg/dL (ref 0.2–1.2)
BUN: 16 mg/dL (ref 7–25)
CO2: 28 mmol/L (ref 20–31)
Calcium: 8.7 mg/dL (ref 8.6–10.3)
Chloride: 105 mmol/L (ref 98–110)
Creat: 1 mg/dL (ref 0.70–1.18)
GLUCOSE: 111 mg/dL — AB (ref 65–99)
Potassium: 4 mmol/L (ref 3.5–5.3)
SODIUM: 140 mmol/L (ref 135–146)
Total Protein: 6.3 g/dL (ref 6.1–8.1)

## 2015-04-18 LAB — LIPID PANEL
Cholesterol: 129 mg/dL (ref 125–200)
HDL: 36 mg/dL — ABNORMAL LOW (ref >=40)
LDL Cholesterol: 59 mg/dL (ref <130)
Total CHOL/HDL Ratio: 3.6 Ratio (ref <=5.0)
Triglycerides: 168 mg/dL — ABNORMAL HIGH (ref <150)
VLDL: 34 mg/dL — ABNORMAL HIGH (ref <30)

## 2015-04-18 LAB — TSH: TSH: 0.582 u[IU]/mL (ref 0.350–4.500)

## 2015-04-20 ENCOUNTER — Other Ambulatory Visit: Payer: Self-pay | Admitting: Cardiovascular Disease

## 2015-04-22 NOTE — Telephone Encounter (Signed)
Rx(s) sent to pharmacy electronically.  

## 2015-05-02 ENCOUNTER — Telehealth: Payer: Self-pay | Admitting: *Deleted

## 2015-05-02 NOTE — Telephone Encounter (Signed)
-----   Message from Troy Sine, MD sent at 04/21/2015  1:26 PM EST ----- Labs good x TG; increase fish oil to 2 cap daily

## 2015-05-02 NOTE — Telephone Encounter (Signed)
Patient notified of lab results and recommendations. 

## 2015-05-11 ENCOUNTER — Other Ambulatory Visit: Payer: Self-pay | Admitting: Cardiovascular Disease

## 2015-05-13 NOTE — Telephone Encounter (Signed)
Rx request sent to pharmacy.  

## 2015-05-17 ENCOUNTER — Telehealth: Payer: Self-pay | Admitting: Cardiovascular Disease

## 2015-05-17 MED ORDER — RANOLAZINE ER 1000 MG PO TB12: 1000.0000 mg | ORAL_TABLET | Freq: Two times a day (BID) | ORAL | Status: DC

## 2015-05-17 NOTE — Telephone Encounter (Signed)
New message      Patient calling the office for samples of medication:   1.  What medication and dosage are you requesting samples for? ranexa 1000mg   2.  Are you currently out of this medication? Almost out

## 2015-05-17 NOTE — Telephone Encounter (Signed)
Patient notified there are no samples available. Offered to send in Rx - patient agreed.  Rx(s) sent to pharmacy electronically to [facility] per patient.

## 2015-06-08 ENCOUNTER — Other Ambulatory Visit: Payer: Self-pay | Admitting: Cardiovascular Disease

## 2015-11-08 ENCOUNTER — Other Ambulatory Visit: Payer: Self-pay | Admitting: Cardiovascular Disease

## 2015-11-12 ENCOUNTER — Other Ambulatory Visit: Payer: Self-pay | Admitting: *Deleted

## 2015-11-12 MED ORDER — NITROSTAT 0.4 MG SL SUBL: SUBLINGUAL_TABLET | SUBLINGUAL | 3 refills | Status: DC

## 2015-11-13 ENCOUNTER — Other Ambulatory Visit: Payer: Self-pay | Admitting: *Deleted

## 2015-11-13 MED ORDER — NITROGLYCERIN 0.4 MG SL SUBL: 0.4000 mg | SUBLINGUAL_TABLET | SUBLINGUAL | 6 refills | Status: DC | PRN

## 2015-11-24 ENCOUNTER — Other Ambulatory Visit: Payer: Self-pay | Admitting: Cardiovascular Disease

## 2015-11-25 NOTE — Telephone Encounter (Signed)
Rx(s) sent to pharmacy electronically.  

## 2015-11-26 ENCOUNTER — Other Ambulatory Visit: Payer: Self-pay | Admitting: *Deleted

## 2015-11-26 MED ORDER — ROSUVASTATIN CALCIUM 20 MG PO TABS: 20.0000 mg | ORAL_TABLET | Freq: Every evening | ORAL | 6 refills | Status: DC

## 2015-11-26 NOTE — Telephone Encounter (Signed)
Refilled crestor 20 mg to patient's  [facility] pharmacy.

## 2015-12-17 ENCOUNTER — Ambulatory Visit (INDEPENDENT_AMBULATORY_CARE_PROVIDER_SITE_OTHER): Payer: PPO

## 2015-12-17 DIAGNOSIS — Z23 Encounter for immunization: Secondary | ICD-10-CM | POA: Diagnosis not present

## 2016-01-10 ENCOUNTER — Other Ambulatory Visit: Payer: Self-pay | Admitting: Cardiovascular Disease

## 2016-02-18 DIAGNOSIS — H35051 Retinal neovascularization, unspecified, right eye: Secondary | ICD-10-CM | POA: Diagnosis not present

## 2016-02-18 DIAGNOSIS — H2513 Age-related nuclear cataract, bilateral: Secondary | ICD-10-CM | POA: Diagnosis not present

## 2016-02-18 DIAGNOSIS — H5213 Myopia, bilateral: Secondary | ICD-10-CM | POA: Diagnosis not present

## 2016-03-19 ENCOUNTER — Telehealth: Payer: Self-pay | Admitting: Cardiovascular Disease

## 2016-03-19 NOTE — Telephone Encounter (Signed)
No ranexa samples 

## 2016-03-19 NOTE — Telephone Encounter (Signed)
New message      Patient calling the office for samples of medication:   1.  What medication and dosage are you requesting samples for?  ranexa 1000  2.  Are you currently out of this medication?  Almost out

## 2016-03-19 NOTE — Telephone Encounter (Signed)
Pt.notified

## 2016-04-06 ENCOUNTER — Ambulatory Visit (INDEPENDENT_AMBULATORY_CARE_PROVIDER_SITE_OTHER): Payer: PPO | Admitting: Cardiovascular Disease

## 2016-04-06 ENCOUNTER — Encounter: Payer: Self-pay | Admitting: Cardiovascular Disease

## 2016-04-06 VITALS — BP 114/58 | HR 55 | Ht 68.0 in | Wt 202.0 lb

## 2016-04-06 DIAGNOSIS — I10 Essential (primary) hypertension: Secondary | ICD-10-CM | POA: Diagnosis not present

## 2016-04-06 DIAGNOSIS — E038 Other specified hypothyroidism: Secondary | ICD-10-CM | POA: Diagnosis not present

## 2016-04-06 DIAGNOSIS — E785 Hyperlipidemia, unspecified: Secondary | ICD-10-CM

## 2016-04-06 DIAGNOSIS — I25810 Atherosclerosis of coronary artery bypass graft(s) without angina pectoris: Secondary | ICD-10-CM | POA: Diagnosis not present

## 2016-04-06 MED ORDER — ASPIRIN EC 81 MG PO TBEC: 81.0000 mg | DELAYED_RELEASE_TABLET | Freq: Every day | ORAL | 3 refills | Status: DC

## 2016-04-06 NOTE — Patient Instructions (Signed)
Your physician recommends that you return for lab work FASTING .  Your physician recommends that you schedule a follow-up appointment and echo in 6 months or sooner if needed.   If you need a refill on your cardiac medications before your next appointment, please call your pharmacy.

## 2016-04-07 ENCOUNTER — Other Ambulatory Visit: Payer: Self-pay | Admitting: Cardiovascular Disease

## 2016-04-07 NOTE — Telephone Encounter (Signed)
Rx has been sent to the pharmacy electronically. ° °

## 2016-04-08 NOTE — Progress Notes (Signed)
Patient ID: Bradley Oconnor, male   DOB: [birth year], 76 y.o.   MRN: [id number]     HPI: Bradley Oconnor, is a 76 y.o. male who presents to the office for a one-year follow-up cardiology evaluation.  Bradley Oconnor has established CAD dating back to 1992 when he suffered an inferior wall myocardial infarction and underwent PTCA of a totally occluded RCA. In 1993 due to progressive CAD, he underwent CABG surgery with a LIMA to Bradley LAD, vein graft sequentially to a diagonal and marginal vein graft to Bradley PDA branch of Bradley right carotid artery. In September 2002 a stent was placed the PLA of Bradley RCA.  In June 2011, he suffered a non-ST segment elevation MI which was felt to be due to RCA graft occlusion which supplied the PDA and PLA vessel. The PDA was extensively collateralized now via the left circumflex territory. Bradley native RCA was totally occluded at the mid level. Bradley LIMA graft is widely patent as was the sequential graft to the diagonal marginal vessel. He has done well particularly with the addition of Ranexa titrated up to 1000 twice a day added to Bradley medical regimen. He believes the Ranexa has made a huge difference in Bradley anginal symptomatology. He is unaware of palpitations he denies presyncope or syncope.  Additional problems include GERD, hyperlipidemia, hypertension.  In 2015, he experienced 3 short-lived episodes of some mild chest pain.  He feels that Bradley CAD is stable.  Bradley Oconnor unfortunately has suffered multiple small strokes and she is now fairly incapacitated and he spends much of Bradley time caring for her.  As result, he has not been as active as he had in the past.  Over the past year, he denies recurrent anginal symptoms. He denies dizziness.  He he has been on ramipril 10 mg, amlodipine 5 mg, HCTZ 12.5 mg as needed for edema, in addition to Toprol-XL 125 mg and isosorbide 120 mg.  He denies bleeding on aspirin.  He has been taking 325 mg aspirin.  He also has been on ranolazine 1000 g  twice a day.  He is was recently switched by Bradley insurance company to generic Crestor 20 mg daily, which he has been on for 30 days and also takes over-the-counter fish oil. He has hypothyroidism on Synthroid replacement and Prevacid for GERD.   Since I last saw him one year ago, he has continued to do well.  He specifically denies chest pain or shortness of breath.  He is remaining active.  He has been caring for Bradley Oconnor was debilitated and suffered 5 strokes.  He presents for one-year evaluation.   Past Medical History:  Diagnosis Date  . Blood transfusion without reported diagnosis   . CAD (coronary artery disease)   . GERD (gastroesophageal reflux disease)   . Heart attack   . Hyperlipidemia   . Hypertension 03/07/12   ECHO-WNL     08/12/11 Lexiscan MyoviewNo significant ischemia demonstrated Low risk scan There is a moderate sized dense scar in the LCX territoy unchanged from the prior study.. Post- stress EF is 40%.  . Thyroid disease     Past Surgical History:  Procedure Laterality Date  . APPENDECTOMY    . CERVICAL SPINE SURGERY     titanium plate in the back of neck  . CORONARY ARTERY BYPASS GRAFT    . HERNIA REPAIR    . SMALL INTESTINE SURGERY    . THYROID SURGERY     1/2 thyroid removed on  right side    Allergies  Allergen Reactions  . Procardia [Nifedipine] Other (See Comments)    Lowers bp   . Phenergan [Promethazine Hcl] Nausea And Vomiting    Current Outpatient Prescriptions  Medication Sig Dispense Refill  . amLODipine (NORVASC) 5 MG tablet TAKE ONE TABLET BY MOUTH ONCE DAILY 30 tablet 6  . fish oil-omega-3 fatty acids 1000 MG capsule Take 1 g by mouth daily.     . Garlic 7322 MG CAPS Take 1 capsule by mouth daily.     . hydrochlorothiazide (MICROZIDE) 12.5 MG capsule Take 1 capsule (12.5 mg total) by mouth as needed (fluid and edema). 90 capsule 1  . lansoprazole (PREVACID) 15 MG capsule Take 15 mg by mouth daily.    . metoprolol succinate (TOPROL-XL) 100  MG 24 hr tablet TAKE ONE AND ONE-FOURTH TABLETS BY MOUTH ONCE DAILY 45 tablet 9  . nitroGLYCERIN (NITROSTAT) 0.4 MG SL tablet Place 1 tablet (0.4 mg total) under the tongue every 5 (five) minutes as needed for chest pain (DO NOT EXCEED A TOTAL 3 DOSES IN 15 MINS). 25 tablet 6  . RANEXA 1000 MG SR tablet TAKE ONE TABLET BY MOUTH TWICE DAILY 180 tablet 0  . rosuvastatin (CRESTOR) 20 MG tablet Take 1 tablet (20 mg total) by mouth every evening. 30 tablet 6  . aspirin EC 81 MG tablet Take 1 tablet (81 mg total) by mouth daily. 90 tablet 3  . isosorbide mononitrate (IMDUR) 120 MG 24 hr tablet TAKE ONE TABLET BY MOUTH ONCE DAILY 90 tablet 2  . levothyroxine (SYNTHROID, LEVOTHROID) 100 MCG tablet TAKE ONE TABLET BY MOUTH ONCE DAILY IN THE MORNING BEFORE BREAKFAST 90 tablet 2  . ramipril (ALTACE) 10 MG capsule TAKE ONE CAPSULE BY MOUTH ONCE DAILY 90 capsule 2   No current facility-administered medications for this visit.     Socially he is married. He does try to do some exercise. He remains relatively active with yard work. Is no tobacco or alcohol use.  ROS General: Negative; No fevers, chills, or night sweats;  HEENT: Negative; No changes in vision or hearing, sinus congestion, difficulty swallowing Pulmonary: Negative; No cough, wheezing, shortness of breath, hemoptysis Cardiovascular: See HPI GI: Negative; No nausea, vomiting, diarrhea, or abdominal pain GU: Negative; No dysuria, hematuria, or difficulty voiding Musculoskeletal: Negative; no myalgias, joint pain, or weakness Hematologic/Oncology: Negative; no easy bruising, bleeding Endocrine: Negative; no heat/cold intolerance; no diabetes Neuro: Negative; no changes in balance, headaches Skin: Negative; No rashes or skin lesions Psychiatric: Negative; No behavioral problems, depression Sleep: Negative; No snoring, daytime sleepiness, hypersomnolence, bruxism, restless legs, hypnogognic hallucinations, no cataplexy Other comprehensive 14  point system review is negative.   PE BP (!) 114/58   Pulse (!) 55   Ht 5' 8"  (1.727 m)   Wt 202 lb (91.6 kg)   BMI 30.71 kg/m    Wt Readings from Last 3 Encounters:  04/06/16 202 lb (91.6 kg)  04/05/15 199 lb 9.6 oz (90.5 kg)  04/17/14 195 lb (88.5 kg)   General: Alert, oriented, no distress.  Skin: normal turgor, no rashes HEENT: Normocephalic, atraumatic. Pupils round and reactive; sclera anicteric;no lid lag.  Nose without nasal septal hypertrophy Mouth/Parynx benign; Mallinpatti scale 3 Neck: No JVD, no carotid bruits with normal carotid upstroke Lungs: clear to ausculatation and percussion; no wheezing or rales Chest wall: No tenderness to palpation Heart: RRR, s1 s2 normal faint 1/6 systolic murmur; no diastolic murmur.  No rubs thrills or heaves Abdomen: soft,  nontender; no hepatosplenomehaly, BS+; abdominal aorta nontender and not dilated by palpation. Back: No CVA tenderness Pulses 2+ Extremities: Trivial ankle swelling;no clubbing cyanosis, Homan's sign negative  Neurologic: grossly nonfocal Psychological: Normal affect and mood  ECG (independently read by me): Sinus bradycardia 55 bpm.  Q wave in lead 3 and aVF.  Nonspecific T changes.  January 2017 ECG (independently read by me): Normal sinus rhythm at 61 bpm.  LVH by voltage.  Old inferior MI with inferior Q waves.    January 2016 ECG (independently read by me): Sinus bradycardia 56 bpm.  Old inferior MI with Q waves and early transition, suggesting posterior wall involvement.  Nonspecific ST changes.  January 2015 ECG: Sinus rhythm at 58 beats per minute. Nonspecific T changes. Evidence for old inferior MI with Q waves in leads 3 and aVF.  LABS: BMP Latest Ref Rng & Units 04/17/2015 04/17/2014 04/06/2014  Glucose 65 - 99 mg/dL 111(H) 93 105(H)  BUN 7 - 25 mg/dL 16 29(H) 16  Creatinine 0.70 - 1.18 mg/dL 1.00 1.27 0.97  Sodium 135 - 146 mmol/L 140 138 136  Potassium 3.5 - 5.3 mmol/L 4.0 4.3 4.2  Chloride 98 -  110 mmol/L 105 102 101  CO2 20 - 31 mmol/L 28 27 28   Calcium 8.6 - 10.3 mg/dL 8.7 9.1 9.0   Hepatic Function Latest Ref Rng & Units 04/17/2015 04/17/2014 04/06/2014  Total Protein 6.1 - 8.1 g/dL 6.3 7.7 6.9  Albumin 3.6 - 5.1 g/dL 3.7 3.9 3.8  AST 10 - 35 U/L 16 17 19   ALT 9 - 46 U/L 20 18 21   Alk Phosphatase 40 - 115 U/L 47 56 58  Total Bilirubin 0.2 - 1.2 mg/dL 0.8 1.1 0.8   CBC Latest Ref Rng & Units 04/17/2015 04/17/2014 04/06/2014  WBC 4.0 - 10.5 K/uL 6.4 7.9 7.6  Hemoglobin 13.0 - 17.0 g/dL 13.5 14.4 14.1  Hematocrit 39.0 - 52.0 % 40.0 42.9 42.6  Platelets 150 - 400 K/uL 147(L) 194 173   Lab Results  Component Value Date   MCV 92.2 04/17/2015   MCV 89.7 04/17/2014   MCV 90.4 04/06/2014   No results found for: HGBA1C  Lab Results  Component Value Date   TSH 0.582 04/17/2015   Lipid Panel     Component Value Date/Time   CHOL 129 04/17/2015 0940   TRIG 168 (H) 04/17/2015 0940   HDL 36 (L) 04/17/2015 0940   CHOLHDL 3.6 04/17/2015 0940   VLDL 34 (H) 04/17/2015 0940   LDLCALC 59 04/17/2015 0940    RADIOLOGY: No results found.  IMPRESSION:  1. Coronary artery disease involving coronary bypass graft of native heart without angina pectoris   2. Essential hypertension   3. Other specified hypothyroidism   4. Hyperlipidemia with target LDL less than 70      ASSESSMENT AND PLAN: Ms. Fester is a 76 year old Caucasian male who suffered an  inferior wall myocardial infarction 26 years ago treated with initial PTCA. He is almost 25 status post CABG surgery. He has documented RCA graft occlusion with good left-to-right collaterals. He states that Ranexa has made a remarkable difference in Bradley symptomatology to Bradley tolerating Bradley medications well. Bradley blood pressure today is well controlled on combination therapy with amlodipine 5 mg, Toprol-XL 125 mg, and Altace 10 mg.  He has not had recurrent anginal symptomatology on the above regimen and also takes isosorbide 120 mg in addition  to Ranexa 1000 mg twice a day.  He has hyperlipidemia and  is on Crestor 20 mg daily.  Target LDL is less than 70.  Laboratory today reveals Bradley LDL at 59.  He has hypothyroidism on thyroxine 100 g.  I have recommended he reduce Bradley aspirin from 325-81 mg.  He does note some intermittent trace ankle edema.  Bradley last echo Doppler study was in 2013.  I recommended in 6 months that he undergo a follow-up echo Doppler study to evaluate both systolic and diastolic heart function as well as Bradley mild systolic murmur.  Fasting laboratory will be obtained.  I will see him in follow-up of the above studies and further conditions will be made at that time.  Time spent: 25 minutes  Troy Sine, MD, [facility]  04/08/2016 6:55 PM

## 2016-04-17 ENCOUNTER — Ambulatory Visit (INDEPENDENT_AMBULATORY_CARE_PROVIDER_SITE_OTHER): Payer: PPO | Admitting: Physician Assistant

## 2016-04-17 VITALS — BP 128/80 | HR 67 | Temp 98.2°F | Resp 17 | Ht 67.5 in | Wt 203.0 lb

## 2016-04-17 DIAGNOSIS — R6889 Other general symptoms and signs: Secondary | ICD-10-CM

## 2016-04-17 MED ORDER — BENZONATATE 100 MG PO CAPS: 100.0000 mg | ORAL_CAPSULE | Freq: Three times a day (TID) | ORAL | 0 refills | Status: DC | PRN

## 2016-04-17 MED ORDER — OSELTAMIVIR PHOSPHATE 75 MG PO CAPS: 75.0000 mg | ORAL_CAPSULE | Freq: Two times a day (BID) | ORAL | 0 refills | Status: DC

## 2016-04-17 MED ORDER — GUAIFENESIN ER 1200 MG PO TB12: 1.0000 | ORAL_TABLET | Freq: Two times a day (BID) | ORAL | 1 refills | Status: DC | PRN

## 2016-04-17 NOTE — Progress Notes (Signed)
[facility] [address] [place] [address] [phone]  Date:  04/17/2016   Name:  Bradley Oconnor   DOB:  [birth year]   MRN:  [id number]  PCP:  Jenny Reichmann, MD    History of Present Illness:  Bradley Oconnor is a 76 y.o. male patient who presents to [facility] for cc of cough and congestion.    --coughing, congestion--started yesterday.  Cough is non-productive.  Joint pain present.  Wife is in the hospital for double pneumonia and influenza.  No sore throat.  There is rhinorrhea.  No sob or dyspnea.  He has taken nothing for his symptoms.  He has been with his wife for the last 5 days in the hospital.  Patient Active Problem List   Diagnosis Date Noted  . GERD (gastroesophageal reflux disease) 04/10/2013  . HTN (hypertension) 04/10/2013  . CAD (coronary artery disease) of artery bypass graft 10/04/2012  . Hypothyroidism 10/04/2012  . Hyperlipidemia with target LDL less than 70 10/04/2012  . Kidney stones 01/27/2012  . Gallstones-symptomatic 01/27/2012  . Hx of appendectomy-history of ruptured requiring ileocecectomy (~1994) 01/27/2012    Past Medical History:  Diagnosis Date  . Blood transfusion without reported diagnosis   . CAD (coronary artery disease)   . GERD (gastroesophageal reflux disease)   . Heart attack   . Hyperlipidemia   . Hypertension 03/07/12   ECHO-WNL     08/12/11 Lexiscan MyoviewNo significant ischemia demonstrated Low risk scan There is a moderate sized dense scar in the LCX territoy unchanged from the prior study.. Post- stress EF is 40%.  . Thyroid disease     Past Surgical History:  Procedure Laterality Date  . APPENDECTOMY    . CERVICAL SPINE SURGERY     titanium plate in the back of neck  . CORONARY ARTERY BYPASS GRAFT    . HERNIA REPAIR    . SMALL INTESTINE SURGERY    . 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 Smoking status: Never Smoker  . Smokeless  tobacco: Never Used  . Alcohol use No    Family History  Problem Relation Age of Onset  . Heart disease Mother   . Cancer Mother     breast, stomach  . Heart disease Father   . Hyperlipidemia Father   . Heart disease Brother   . Diabetes Brother   . Hyperlipidemia Brother   . Heart disease Paternal Grandfather   . Healthy Brother     Allergies  Allergen Reactions  . Procardia [Nifedipine] Other (See Comments)    Lowers bp   . Phenergan [Promethazine Hcl] Nausea And Vomiting    Medication list has been reviewed and updated.  Current Outpatient Prescriptions on File Prior to Visit  Medication Sig Dispense Refill  . amLODipine (NORVASC) 5 MG tablet TAKE ONE TABLET BY MOUTH ONCE DAILY 30 tablet 6  . aspirin EC 81 MG tablet Take 1 tablet (81 mg total) by mouth daily. 90 tablet 3  . fish oil-omega-3 fatty acids 1000 MG capsule Take 1 g by mouth daily.     . Garlic 123XX123 MG CAPS Take 1 capsule by mouth daily.     . hydrochlorothiazide (MICROZIDE) 12.5 MG capsule Take 1 capsule (12.5 mg total) by mouth as needed (fluid and edema). 90 capsule 1  . isosorbide mononitrate (IMDUR) 120 MG 24 hr tablet TAKE ONE TABLET BY  MOUTH ONCE DAILY 90 tablet 2  . lansoprazole (PREVACID) 15 MG capsule Take 15 mg by mouth daily.    Marland Kitchen levothyroxine (SYNTHROID, LEVOTHROID) 100 MCG tablet TAKE ONE TABLET BY MOUTH ONCE DAILY IN THE MORNING BEFORE BREAKFAST 90 tablet 2  . metoprolol succinate (TOPROL-XL) 100 MG 24 hr tablet TAKE ONE AND ONE-FOURTH TABLETS BY MOUTH ONCE DAILY 45 tablet 9  . nitroGLYCERIN (NITROSTAT) 0.4 MG SL tablet Place 1 tablet (0.4 mg total) under the tongue every 5 (five) minutes as needed for chest pain (DO NOT EXCEED A TOTAL 3 DOSES IN 15 MINS). 25 tablet 6  . ramipril (ALTACE) 10 MG capsule TAKE ONE CAPSULE BY MOUTH ONCE DAILY 90 capsule 2  . RANEXA 1000 MG SR tablet TAKE ONE TABLET BY MOUTH TWICE DAILY 180 tablet 0  . rosuvastatin (CRESTOR) 20 MG tablet Take 1 tablet (20 mg total)  by mouth every evening. 30 tablet 6   No current facility-administered medications on file prior to visit.     ROS ROS otherwise unremarkable unless listed above.   Physical Examination: BP 128/80 (BP Location: Right Arm, Patient Position: Sitting, Cuff Size: Normal)   Pulse 67   Temp 98.2 F (36.8 C) (Oral)   Resp 17   Ht 5' 7.5" (1.715 m)   Wt 203 lb (92.1 kg)   SpO2 94%   BMI 31.33 kg/m  Ideal Body Weight: Weight in (lb) to have BMI = 25: 161.7  Physical Exam  Constitutional: He is oriented to person, place, and time. He appears well-developed and well-nourished. No distress.  HENT:  Head: Normocephalic and atraumatic.  Right Ear: Tympanic membrane, external ear and ear canal normal.  Left Ear: Tympanic membrane, external ear and ear canal normal.  Nose: Mucosal edema and rhinorrhea present. Right sinus exhibits no maxillary sinus tenderness and no frontal sinus tenderness. Left sinus exhibits no maxillary sinus tenderness and no frontal sinus tenderness.  Mouth/Throat: No uvula swelling. No oropharyngeal exudate, posterior oropharyngeal edema or posterior oropharyngeal erythema.  Eyes: Conjunctivae, EOM and lids are normal. Pupils are equal, round, and reactive to light. Right eye exhibits normal extraocular motion. Left eye exhibits normal extraocular motion.  Neck: Trachea normal and full passive range of motion without pain. No edema and no erythema present.  Cardiovascular: Normal rate and regular rhythm.  Exam reveals no friction rub.   No murmur heard. Pulmonary/Chest: Effort normal and breath sounds normal. No apnea. No respiratory distress. He has no decreased breath sounds. He has no wheezes. He has no rhonchi.  Neurological: He is alert and oriented to person, place, and time.  Skin: Skin is warm and dry. He is not diaphoretic.  Psychiatric: He has a normal mood and affect. His behavior is normal.     Assessment and Plan: Bradley Oconnor is a 76 y.o. male who  is here today  Appropriate coverage of possible influenza with tamiflu at this time.  Standard adult dosing today.   Advised of warning symptoms to warrant an immediate return.   Flu-like symptoms - Plan: oseltamivir (TAMIFLU) 75 MG capsule, benzonatate (TESSALON) 100 MG capsule, Guaifenesin (MUCINEX MAXIMUM STRENGTH) 1200 MG TB12  Ivar Drape, PA-C [facility] 1/20/20189:26 AM

## 2016-04-17 NOTE — Patient Instructions (Addendum)
Please make sure that you're hydrating with 64 ounces or more of water. Please follow-up if you are not having improvement of your symptoms over the next 7 days, uncontrolled fever, disorientation, shortness of breath or trouble breathing, etc. Please read through. Influenza, Adult Influenza ("the flu") is an infection in the lungs, nose, and throat (respiratory tract). It is caused by a virus. The flu causes many common cold symptoms, as well as a high fever and body aches. It can make you feel very sick. The flu spreads easily from person to person (is contagious). Getting a flu shot (influenza vaccination) every year is the best way to prevent the flu. Follow these instructions at home:  Take over-the-counter and prescription medicines only as told by your doctor.  Use a cool mist humidifier to add moisture (humidity) to the air in your home. This can make it easier to breathe.  Rest as needed.  Drink enough fluid to keep your pee (urine) clear or pale yellow.  Cover your mouth and nose when you cough or sneeze.  Wash your hands with soap and water often, especially after you cough or sneeze. If you cannot use soap and water, use hand sanitizer.  Stay home from work or school as told by your doctor. Unless you are visiting your doctor, try to avoid leaving home until your fever has been gone for 24 hours without the use of medicine.  Keep all follow-up visits as told by your doctor. This is important. How is this prevented?  Getting a yearly (annual) flu shot is the best way to avoid getting the flu. You may get the flu shot in late summer, fall, or winter. Ask your doctor when you should get your flu shot.  Wash your hands often or use hand sanitizer often.  Avoid contact with people who are sick during cold and flu season.  Eat healthy foods.  Drink plenty of fluids.  Get enough sleep.  Exercise regularly. Contact a doctor if:  You get new symptoms.  You have:  Chest  pain.  Watery poop (diarrhea).  A fever.  Your cough gets worse.  You start to have more mucus.  You feel sick to your stomach (nauseous).  You throw up (vomit). Get help right away if:  You start to be short of breath or have trouble breathing.  Your skin or nails turn a bluish color.  You have very bad pain or stiffness in your neck.  You get a sudden headache.  You get sudden pain in your face or ear.  You cannot stop throwing up. This information is not intended to replace advice given to you by your health care provider. Make sure you discuss any questions you have with your health care provider. Document Released: 12/24/2007 Document Revised: 08/22/2015 Document Reviewed: 01/08/2015 Elsevier Interactive Patient Education  2017 Elsevier Inc. Oseltamivir capsules What is this medicine? OSELTAMIVIR (os el TAM i vir) is an antiviral medicine. It is used to prevent and to treat some kinds of influenza or the flu. It will not work for colds or other viral infections. This medicine may be used for other purposes; ask your health care provider or pharmacist if you have questions. COMMON BRAND NAME(S): Tamiflu What should I tell my health care provider before I take this medicine? They need to know if you have any of the following conditions: -heart disease -immune system problems -kidney disease -liver disease -lung disease -an unusual or allergic reaction to oseltamivir, other medicines, foods,  dyes, or preservatives -pregnant or trying to get pregnant -breast-feeding How should I use this medicine? Take this medicine by mouth with a glass of water. Follow the directions on the prescription label. Start this medicine at the first sign of flu symptoms. You can take it with or without food. If it upsets your stomach, take it with food. Take your medicine at regular intervals. Do not take your medicine more often than directed. Take all of your medicine as directed even if  you think you are better. Do not skip doses or stop your medicine early. Talk to your pediatrician regarding the use of this medicine in children. While this drug may be prescribed for children as young as 14 days for selected conditions, precautions do apply. Overdosage: If you think you have taken too much of this medicine contact a poison control center or emergency room at once. NOTE: This medicine is only for you. Do not share this medicine with others. What if I miss a dose? If you miss a dose, take it as soon as you remember. If it is almost time for your next dose (within 2 hours), take only that dose. Do not take double or extra doses. What may interact with this medicine? Interactions are not expected. This list may not describe all possible interactions. Give your health care provider a list of all the medicines, herbs, non-prescription drugs, or dietary supplements you use. Also tell them if you smoke, drink alcohol, or use illegal drugs. Some items may interact with your medicine. What should I watch for while using this medicine? Visit your doctor or health care professional for regular check ups. Tell your doctor if your symptoms do not start to get better or if they get worse. If you have the flu, you may be at an increased risk of developing seizures, confusion, or abnormal behavior. This occurs early in the illness, and more frequently in children and teens. These events are not common, but may result in accidental injury to the patient. Families and caregivers of patients should watch for signs of unusual behavior and contact a doctor or health care professional right away if the patient shows signs of unusual behavior. This medicine is not a substitute for the flu shot. Talk to your doctor each year about an annual flu shot. What side effects may I notice from receiving this medicine? Side effects that you should report to your doctor or health care professional as soon as  possible: -allergic reactions like skin rash, itching or hives, swelling of the face, lips, or tongue -anxiety, confusion, unusual behavior -breathing problems -hallucination, loss of contact with reality -redness, blistering, peeling or loosening of the skin, including inside the mouth -seizures Side effects that usually do not require medical attention (report to your doctor or health care professional if they continue or are bothersome): -diarrhea -headache -nausea, vomiting -pain This list may not describe all possible side effects. Call your doctor for medical advice about side effects. You may report side effects to FDA at 1-800-FDA-1088. Where should I keep my medicine? Keep out of the reach of children. Store at room temperature between 15 and 30 degrees C (59 and 86 degrees F). Throw away any unused medicine after the expiration date. NOTE: This sheet is a summary. It may not cover all possible information. If you have questions about this medicine, talk to your doctor, pharmacist, or health care provider.  2017 Elsevier/Gold Standard (2014-09-19 10:50:39)     IF you received an  x-ray today, you will receive an invoice from [facility]. Please contact [facility] at [phone] with questions or concerns regarding your invoice.   IF you received labwork today, you will receive an invoice from [facility]. Please contact [facility] at [phone] with questions or concerns regarding your invoice.   Our billing staff will not be able to assist you with questions regarding bills from these companies.  You will be contacted with the lab results as soon as they are available. The fastest way to get your results is to activate your My Chart account. Instructions are located on the last page of this paperwork. If you have not heard from [place] regarding the results in 2 weeks, please contact this office.

## 2016-04-29 DIAGNOSIS — I25810 Atherosclerosis of coronary artery bypass graft(s) without angina pectoris: Secondary | ICD-10-CM | POA: Diagnosis not present

## 2016-04-29 DIAGNOSIS — E038 Other specified hypothyroidism: Secondary | ICD-10-CM | POA: Diagnosis not present

## 2016-04-29 DIAGNOSIS — E785 Hyperlipidemia, unspecified: Secondary | ICD-10-CM | POA: Diagnosis not present

## 2016-04-29 DIAGNOSIS — I10 Essential (primary) hypertension: Secondary | ICD-10-CM | POA: Diagnosis not present

## 2016-04-29 LAB — CBC
HEMATOCRIT: 40.7 % (ref 38.5–50.0)
Hemoglobin: 13.4 g/dL (ref 13.2–17.1)
MCH: 29.9 pg (ref 27.0–33.0)
MCHC: 32.9 g/dL (ref 32.0–36.0)
MCV: 90.8 fL (ref 80.0–100.0)
MPV: 8.5 fL (ref 7.5–12.5)
Platelets: 196 10*3/uL (ref 140–400)
RBC: 4.48 MIL/uL (ref 4.20–5.80)
RDW: 14.2 % (ref 11.0–15.0)
WBC: 7.3 10*3/uL (ref 3.8–10.8)

## 2016-04-30 LAB — COMPREHENSIVE METABOLIC PANEL
ALK PHOS: 53 U/L (ref 40–115)
ALT: 34 U/L (ref 9–46)
AST: 24 U/L (ref 10–35)
Albumin: 3.5 g/dL — ABNORMAL LOW (ref 3.6–5.1)
BUN: 14 mg/dL (ref 7–25)
CHLORIDE: 106 mmol/L (ref 98–110)
CO2: 29 mmol/L (ref 20–31)
Calcium: 9 mg/dL (ref 8.6–10.3)
Creat: 0.83 mg/dL (ref 0.70–1.18)
Glucose, Bld: 114 mg/dL — ABNORMAL HIGH (ref 65–99)
POTASSIUM: 4.2 mmol/L (ref 3.5–5.3)
Sodium: 143 mmol/L (ref 135–146)
TOTAL PROTEIN: 6.8 g/dL (ref 6.1–8.1)
Total Bilirubin: 0.9 mg/dL (ref 0.2–1.2)

## 2016-04-30 LAB — LIPID PANEL
CHOL/HDL RATIO: 4.1 ratio (ref <5.0)
CHOLESTEROL: 141 mg/dL (ref <200)
HDL: 34 mg/dL — ABNORMAL LOW (ref >40)
LDL Cholesterol: 57 mg/dL (ref <100)
TRIGLYCERIDES: 252 mg/dL — AB (ref <150)
VLDL: 50 mg/dL — AB (ref <30)

## 2016-04-30 LAB — TSH: TSH: 0.5 mIU/L (ref 0.40–4.50)

## 2016-05-05 DIAGNOSIS — H3561 Retinal hemorrhage, right eye: Secondary | ICD-10-CM | POA: Diagnosis not present

## 2016-05-05 DIAGNOSIS — H43811 Vitreous degeneration, right eye: Secondary | ICD-10-CM | POA: Diagnosis not present

## 2016-05-07 ENCOUNTER — Telehealth: Payer: Self-pay | Admitting: *Deleted

## 2016-05-07 NOTE — Telephone Encounter (Signed)
Left lab results and recomendations on patient's home answering machine. Return call if questions and/or concerns.

## 2016-05-07 NOTE — Telephone Encounter (Signed)
-----   Message from Troy Sine, MD sent at 05/03/2016 11:47 AM EST ----- Labs good, but triglycerides/VLDL remain elevated.  Increase omega-3 fatty acids to release 2 -4 capsules per day and significantly improved diet

## 2016-05-29 ENCOUNTER — Other Ambulatory Visit: Payer: Self-pay | Admitting: Cardiovascular Disease

## 2016-05-29 NOTE — Telephone Encounter (Signed)
Rx(s) sent to pharmacy electronically.  

## 2016-07-06 ENCOUNTER — Other Ambulatory Visit: Payer: Self-pay | Admitting: Cardiovascular Disease

## 2016-07-07 ENCOUNTER — Other Ambulatory Visit: Payer: Self-pay

## 2016-07-07 ENCOUNTER — Ambulatory Visit (HOSPITAL_COMMUNITY): Payer: PPO | Attending: Internal Medicine

## 2016-07-07 DIAGNOSIS — I351 Nonrheumatic aortic (valve) insufficiency: Secondary | ICD-10-CM | POA: Insufficient documentation

## 2016-07-07 DIAGNOSIS — I2510 Atherosclerotic heart disease of native coronary artery without angina pectoris: Secondary | ICD-10-CM | POA: Insufficient documentation

## 2016-07-07 DIAGNOSIS — I340 Nonrheumatic mitral (valve) insufficiency: Secondary | ICD-10-CM | POA: Insufficient documentation

## 2016-07-07 DIAGNOSIS — I071 Rheumatic tricuspid insufficiency: Secondary | ICD-10-CM | POA: Insufficient documentation

## 2016-07-07 DIAGNOSIS — I25810 Atherosclerosis of coronary artery bypass graft(s) without angina pectoris: Secondary | ICD-10-CM

## 2016-07-07 DIAGNOSIS — E785 Hyperlipidemia, unspecified: Secondary | ICD-10-CM | POA: Insufficient documentation

## 2016-07-07 DIAGNOSIS — I10 Essential (primary) hypertension: Secondary | ICD-10-CM

## 2016-07-29 ENCOUNTER — Other Ambulatory Visit: Payer: Self-pay | Admitting: Cardiovascular Disease

## 2016-08-03 ENCOUNTER — Encounter: Payer: Self-pay | Admitting: *Deleted

## 2016-08-04 DIAGNOSIS — H35051 Retinal neovascularization, unspecified, right eye: Secondary | ICD-10-CM | POA: Diagnosis not present

## 2016-08-04 DIAGNOSIS — H43811 Vitreous degeneration, right eye: Secondary | ICD-10-CM | POA: Diagnosis not present

## 2016-10-19 ENCOUNTER — Telehealth: Payer: Self-pay | Admitting: Emergency Medicine

## 2016-10-19 ENCOUNTER — Telehealth: Payer: Self-pay | Admitting: Cardiovascular Disease

## 2016-10-19 NOTE — Telephone Encounter (Signed)
New message  Patient calling the office for samples of medication:   1.  What medication and dosage are you requesting samples for? Ranexa 1000mg    2.  Are you currently out of this medication? yes

## 2016-10-19 NOTE — Telephone Encounter (Signed)
New Message         Pt is returning call about samples

## 2016-10-19 NOTE — Telephone Encounter (Signed)
Called patient to inform him that we did not have any samples of Ranexa, unable to reach patient; left message for patient to return call.

## 2016-10-19 NOTE — Telephone Encounter (Signed)
Spoke with patient informed him that we do not have any samples of Ranexa at this time. Encouraged him to contact his PCP and see if they may have some samples. Patient voiced understanding.

## 2016-10-19 NOTE — Telephone Encounter (Signed)
Ranexa 1000mg  // too expensive// please call with lower cost alternative   [phone] please call

## 2016-10-21 NOTE — Telephone Encounter (Signed)
Pt would not like re establish care this time. States he already spoke with cardiologist regarding medication.

## 2016-11-10 ENCOUNTER — Ambulatory Visit (INDEPENDENT_AMBULATORY_CARE_PROVIDER_SITE_OTHER): Payer: PPO | Admitting: Cardiovascular Disease

## 2016-11-10 ENCOUNTER — Encounter: Payer: Self-pay | Admitting: Cardiovascular Disease

## 2016-11-10 VITALS — BP 131/70 | HR 56 | Ht 67.5 in | Wt 199.4 lb

## 2016-11-10 DIAGNOSIS — I358 Other nonrheumatic aortic valve disorders: Secondary | ICD-10-CM

## 2016-11-10 DIAGNOSIS — I10 Essential (primary) hypertension: Secondary | ICD-10-CM

## 2016-11-10 DIAGNOSIS — I25810 Atherosclerosis of coronary artery bypass graft(s) without angina pectoris: Secondary | ICD-10-CM | POA: Diagnosis not present

## 2016-11-10 DIAGNOSIS — E782 Mixed hyperlipidemia: Secondary | ICD-10-CM | POA: Diagnosis not present

## 2016-11-10 NOTE — Progress Notes (Signed)
Patient ID: [name], male   DOB: 04-05-1940, 76 y.o.   MRN: [id number]     HPI: Bradley Oconnor, is a 76 y.o. male who presents to the office for a 7 month follow-up cardiology evaluation.  Bradley Oconnor has established CAD dating back to 1992 when he suffered an inferior wall myocardial infarction and underwent PTCA of a totally occluded RCA. In 1993 due to progressive CAD, he underwent CABG surgery with a LIMA to his LAD, vein graft sequentially to a diagonal and marginal vein graft to his PDA branch of his right carotid artery. In September 2002 a stent was placed the PLA of his RCA.  In June 2011, he suffered a non-ST segment elevation MI which was felt to be due to RCA graft occlusion which supplied the PDA and PLA vessel. The PDA was extensively collateralized now via the left circumflex territory. His native RCA was totally occluded at the mid level. His LIMA graft is widely patent as was the sequential graft to the diagonal marginal vessel. He has done well particularly with the addition of Ranexa titrated up to 1000 twice a day added to his medical regimen. He believes the Ranexa has made a huge difference in his anginal symptomatology. He is unaware of palpitations he denies presyncope or syncope.  Additional problems include GERD, hyperlipidemia, hypertension.  In 2015, he experienced 3 short-lived episodes of some mild chest pain.  He feels that his CAD is stable.  His wife unfortunately has suffered multiple small strokes and she is now fairly incapacitated and he spends much of his time caring for her.  As result, he has not been as active as he had in the past.  Over the past year, he denies recurrent anginal symptoms. He denies dizziness.  He he has been on ramipril 10 mg, amlodipine 5 mg, HCTZ 12.5 mg as needed for edema, in addition to Toprol-XL 125 mg and isosorbide 120 mg.  He denies bleeding on aspirin.  He has been taking 325 mg aspirin.  He also has been on ranolazine 1000 g  twice a day.  He is was recently switched by his insurance company to generic Crestor 20 mg daily, which he has been on for 30 days and also takes over-the-counter fish oil. He has hypothyroidism on Synthroid replacement and Prevacid for GERD.   Since I last saw him in January 2018, he is been without chest pain.  He has not been able to exercise as much as he had in this past since he is caring for his debilitated wife who has suffered 5 strokes.  He denies any palpitations.  He denies difficulty sleeping.  He has noticed a dramatic benefit since he started taking Ranexa with reference to his chronic angina.  He also has some difficulty hearing and uses a hearing aid.  He presents for one-year evaluation.   Past Medical History:  Diagnosis Date  . Blood transfusion without reported diagnosis   . CAD (coronary artery disease)   . GERD (gastroesophageal reflux disease)   . Heart attack ([place])   . Hyperlipidemia   . Hypertension 03/07/12   ECHO-WNL     08/12/11 Lexiscan MyoviewNo significant ischemia demonstrated Low risk scan There is a moderate sized dense scar in the LCX territoy unchanged from the prior study.. Post- stress EF is 40%.  . Thyroid disease     Past Surgical History:  Procedure Laterality Date  . APPENDECTOMY    . CERVICAL SPINE SURGERY  titanium plate in the back of neck  . CORONARY ARTERY BYPASS GRAFT    . HERNIA REPAIR    . SMALL INTESTINE SURGERY    . THYROID SURGERY     1/2 thyroid removed on right side    Allergies  Allergen Reactions  . Procardia [Nifedipine] Other (See Comments)    Lowers bp   . Phenergan [Promethazine Hcl] Nausea And Vomiting    Current Outpatient Prescriptions  Medication Sig Dispense Refill  . amLODipine (NORVASC) 5 MG tablet TAKE ONE TABLET BY MOUTH ONCE DAILY 30 tablet 6  . aspirin EC 81 MG tablet Take 1 tablet (81 mg total) by mouth daily. 90 tablet 3  . fish oil-omega-3 fatty acids 1000 MG capsule Take 1 g by mouth daily.       . Garlic 4696 MG CAPS Take 1 capsule by mouth daily.     . hydrochlorothiazide (MICROZIDE) 12.5 MG capsule Take 1 capsule (12.5 mg total) by mouth daily as needed (fluid and edema). 90 capsule 3  . isosorbide mononitrate (IMDUR) 120 MG 24 hr tablet TAKE ONE TABLET BY MOUTH ONCE DAILY 90 tablet 2  . lansoprazole (PREVACID) 15 MG capsule Take 15 mg by mouth daily.    Marland Kitchen levothyroxine (SYNTHROID, LEVOTHROID) 100 MCG tablet TAKE ONE TABLET BY MOUTH ONCE DAILY IN THE MORNING BEFORE BREAKFAST 90 tablet 2  . metoprolol succinate (TOPROL-XL) 100 MG 24 hr tablet Take 1 and 1/4 tablet by mouth once daily. 120 tablet 3  . nitroGLYCERIN (NITROSTAT) 0.4 MG SL tablet Place 1 tablet (0.4 mg total) under the tongue every 5 (five) minutes as needed for chest pain (DO NOT EXCEED A TOTAL 3 DOSES IN 15 MINS). 25 tablet 6  . ramipril (ALTACE) 10 MG capsule TAKE ONE CAPSULE BY MOUTH ONCE DAILY 90 capsule 2  . RANEXA 1000 MG SR tablet TAKE ONE TABLET BY MOUTH TWICE DAILY 180 tablet 3  . rosuvastatin (CRESTOR) 20 MG tablet Take 1 tablet (20 mg total) by mouth every evening. 30 tablet 6   No current facility-administered medications for this visit.     Socially he is married. He does try to do some exercise. He remains relatively active with yard work. Is no tobacco or alcohol use.  ROS General: Negative; No fevers, chills, or night sweats;  HEENT: Uses a hearing aid; No changes in vision , sinus congestion, difficulty swallowing Pulmonary: Negative; No cough, wheezing, shortness of breath, hemoptysis Cardiovascular: See HPI GI: Negative; No nausea, vomiting, diarrhea, or abdominal pain GU: Negative; No dysuria, hematuria, or difficulty voiding Musculoskeletal: Negative; no myalgias, joint pain, or weakness Hematologic/Oncology: Negative; no easy bruising, bleeding Endocrine: Negative; no heat/cold intolerance; no diabetes Neuro: Negative; no changes in balance, headaches Skin: Negative; No rashes or skin  lesions Psychiatric: Negative; No behavioral problems, depression Sleep: Negative; No snoring, daytime sleepiness, hypersomnolence, bruxism, restless legs, hypnogognic hallucinations, no cataplexy Other comprehensive 14 point system review is negative.   PE BP 131/70   Pulse (!) 56   Ht 5' 7.5" (1.715 m)   Wt 199 lb 6.4 oz (90.4 kg)   BMI 30.77 kg/m    Repeat blood pressure by me 120/78  Wt Readings from Last 3 Encounters:  11/10/16 199 lb 6.4 oz (90.4 kg)  04/17/16 203 lb (92.1 kg)  04/06/16 202 lb (91.6 kg)   General: Alert, oriented, no distress.  Skin: normal turgor, no rashes, warm and dry HEENT: Normocephalic, atraumatic. Pupils equal round and reactive to light; sclera anicteric; extraocular  muscles intact;  Nose without nasal septal hypertrophy Mouth/Parynx benign; Mallinpatti scale 3 Neck: No JVD, no carotid bruits; normal carotid upstroke Lungs: clear to ausculatation and percussion; no wheezing or rales Chest wall: without tenderness to palpitation Heart: PMI not displaced, RRR, s1 s2 normal, 1/6 systolic murmur, no diastolic murmur, no rubs, gallops, thrills, or heaves Abdomen: soft, nontender; no hepatosplenomehaly, BS+; abdominal aorta nontender and not dilated by palpation. Back: no CVA tenderness Pulses 2+ Musculoskeletal: full range of motion, normal strength, no joint deformities Extremities: Trivial ankle edema no clubbing cyanosis , Homan's sign negative  Neurologic: grossly nonfocal; Cranial nerves grossly wnl Psychologic: Normal mood and affect   ECG (independently read by me): Sinus bradycardia 56 bpm.  Borderline LVH.  Q waves in lead 3 and aVF.  QTc interval 467 ms  January 2018 ECG (independently read by me): Sinus bradycardia 55 bpm.  Q wave in lead 3 and aVF.  Nonspecific T changes.  January 2017 ECG (independently read by me): Normal sinus rhythm at 61 bpm.  LVH by voltage.  Old inferior MI with inferior Q waves.    January 2016 ECG  (independently read by me): Sinus bradycardia 56 bpm.  Old inferior MI with Q waves and early transition, suggesting posterior wall involvement.  Nonspecific ST changes.  January 2015 ECG: Sinus rhythm at 58 beats per minute. Nonspecific T changes. Evidence for old inferior MI with Q waves in leads 3 and aVF.  LABS: BMP Latest Ref Rng & Units 04/29/2016 04/17/2015 04/17/2014  Glucose 65 - 99 mg/dL 114(H) 111(H) 93  BUN 7 - 25 mg/dL 14 16 29(H)  Creatinine 0.70 - 1.18 mg/dL 0.83 1.00 1.27  Sodium 135 - 146 mmol/L 143 140 138  Potassium 3.5 - 5.3 mmol/L 4.2 4.0 4.3  Chloride 98 - 110 mmol/L 106 105 102  CO2 20 - 31 mmol/L _0 Calcium 8.6 - 10.3 mg/dL 9.0 8.7 9.1   Hepatic Function Latest Ref Rng & Units 04/29/2016 04/17/2015 04/17/2014  Total Protein 6.1 - 8.1 g/dL 6.8 6.3 7.7  Albumin 3.6 - 5.1 g/dL 3.5(L) 3.7 3.9  AST 10 - 35 U/L _1 ALT 9 - 46 U/L 34 20 18  Alk Phosphatase 40 - 115 U/L 53 47 56  Total Bilirubin 0.2 - 1.2 mg/dL 0.9 0.8 1.1   CBC Latest Ref Rng & Units 04/29/2016 04/17/2015 04/17/2014  WBC 3.8 - 10.8 K/uL 7.3 6.4 7.9  Hemoglobin 13.2 - 17.1 g/dL 13.4 13.5 14.4  Hematocrit 38.5 - 50.0 % 40.7 40.0 42.9  Platelets 140 - 400 K/uL 196 147(L) 194   Lab Results  Component Value Date   MCV 90.8 04/29/2016   MCV 92.2 04/17/2015   MCV 89.7 04/17/2014   No results found for: HGBA1C  Lab Results  Component Value Date   TSH 0.50 04/29/2016   Lipid Panel     Component Value Date/Time   CHOL 141 04/29/2016 1213   TRIG 252 (H) 04/29/2016 1213   HDL 34 (L) 04/29/2016 1213   CHOLHDL 4.1 04/29/2016 1213   VLDL 50 (H) 04/29/2016 1213   LDLCALC 57 04/29/2016 1213    RADIOLOGY: No results found.  IMPRESSION:  1. Coronary artery disease involving coronary bypass graft of native heart without angina pectoris   2. Essential hypertension   3. Mixed hyperlipidemia   4. Aortic valve sclerosis      ASSESSMENT AND PLAN: Ms. Nakamura is a 76 year old Caucasian male  who suffered an  inferior wall myocardial infarction in 1992 26 years ago treated with initial PTCA. He is  status post CABG surgery in 1993 due to progressive CAD.Marland Kitchen He has documented RCA graft occlusion with good left-to-right collaterals. He states that Ranexa has made a remarkable difference in his symptomatology to his tolerating his medications well.  His blood pressure today is stable and rechecked by me was 120/78 on amlodipine 5 mg, Toprol-XL 125 mg, isosorbide 120 mg, HCTZ 12.5 mg, ramipril 10 mg.  He continues to be on Ranexa 1000 mg twice a day.  His lipids are managed with Crestor 20 mg and LDL cholesterol in January 2018 was 57.  Triglycerides were elevated as was VLDL suggesting an atherogenic dyslipidemic profile.  Improve diet is recommended.  He may also benefit from omega-3 fatty acids.  He is not having anginal symptoms.  I reviewed his echo Doppler study from April 2018 which showed normal systolic function with mild LVH and grade 1 diastolic dysfunction.  There was aortic valve sclerosis without stenosis.  The right ventricle and atrium were dilated.  There was mild TR.  He did normal pulmonary pressures.  Of note, compared to a prior echo in 2013.  His ejection fraction improved from 45-50%, now at 55-60%.  He will continue with his current regimen.  I will see him in 6 months for reevaluation or sooner if problems arise. Time spent: 25 minutes  Bradley Sine, MD, [facility]  11/12/2016 8:56 PM

## 2016-11-10 NOTE — Patient Instructions (Signed)
Your physician wants you to follow-up in: 6 months or sooner if needed. You will receive a reminder letter in the mail two months in advance. If you don't receive a letter, please call our office to schedule the follow-up appointment.   If you need a refill on your cardiac medications before your next appointment, please call your pharmacy. 

## 2016-11-19 ENCOUNTER — Other Ambulatory Visit: Payer: Self-pay | Admitting: Cardiovascular Disease

## 2016-12-22 ENCOUNTER — Ambulatory Visit (INDEPENDENT_AMBULATORY_CARE_PROVIDER_SITE_OTHER): Payer: PPO

## 2016-12-22 ENCOUNTER — Ambulatory Visit (INDEPENDENT_AMBULATORY_CARE_PROVIDER_SITE_OTHER): Payer: PPO | Admitting: Family Medicine

## 2016-12-22 ENCOUNTER — Ambulatory Visit (HOSPITAL_COMMUNITY)
Admission: RE | Admit: 2016-12-22 | Discharge: 2016-12-22 | Disposition: A | Discharge status: Home / Self Care | Payer: PPO | Source: Ambulatory Visit | Attending: Family Medicine | Admitting: Family Medicine

## 2016-12-22 ENCOUNTER — Encounter: Payer: Self-pay | Admitting: Family Medicine

## 2016-12-22 VITALS — BP 124/64 | HR 52 | Temp 97.6°F | Resp 17 | Ht 67.5 in | Wt 201.0 lb

## 2016-12-22 DIAGNOSIS — W19XXXA Unspecified fall, initial encounter: Secondary | ICD-10-CM

## 2016-12-22 DIAGNOSIS — M25511 Pain in right shoulder: Secondary | ICD-10-CM | POA: Diagnosis not present

## 2016-12-22 DIAGNOSIS — S59912A Unspecified injury of left forearm, initial encounter: Secondary | ICD-10-CM | POA: Diagnosis not present

## 2016-12-22 DIAGNOSIS — R0781 Pleurodynia: Secondary | ICD-10-CM

## 2016-12-22 DIAGNOSIS — M25512 Pain in left shoulder: Secondary | ICD-10-CM | POA: Diagnosis not present

## 2016-12-22 DIAGNOSIS — M7989 Other specified soft tissue disorders: Secondary | ICD-10-CM | POA: Diagnosis not present

## 2016-12-22 DIAGNOSIS — M898X6 Other specified disorders of bone, lower leg: Secondary | ICD-10-CM

## 2016-12-22 DIAGNOSIS — S93402A Sprain of unspecified ligament of left ankle, initial encounter: Secondary | ICD-10-CM

## 2016-12-22 DIAGNOSIS — T07XXXA Unspecified multiple injuries, initial encounter: Secondary | ICD-10-CM

## 2016-12-22 DIAGNOSIS — H5712 Ocular pain, left eye: Secondary | ICD-10-CM | POA: Diagnosis not present

## 2016-12-22 DIAGNOSIS — S4992XA Unspecified injury of left shoulder and upper arm, initial encounter: Secondary | ICD-10-CM | POA: Diagnosis not present

## 2016-12-22 DIAGNOSIS — S6992XA Unspecified injury of left wrist, hand and finger(s), initial encounter: Secondary | ICD-10-CM | POA: Diagnosis not present

## 2016-12-22 DIAGNOSIS — M79661 Pain in right lower leg: Secondary | ICD-10-CM | POA: Diagnosis not present

## 2016-12-22 DIAGNOSIS — S299XXA Unspecified injury of thorax, initial encounter: Secondary | ICD-10-CM | POA: Diagnosis not present

## 2016-12-22 DIAGNOSIS — M79632 Pain in left forearm: Secondary | ICD-10-CM

## 2016-12-22 DIAGNOSIS — M25532 Pain in left wrist: Secondary | ICD-10-CM

## 2016-12-22 NOTE — Patient Instructions (Addendum)
You are schedule for CT exam today at [facility]. Please arrive at the main entrance to the hospital 15 minutes prior to your appointment time and they will guide you to the radiology department.   See information below on contusions and chest wall pain. Additionally I printed information on ankle sprain. Tylenol is safest for pain,, but let me know if you need a stronger medication and I will be happy to prescribe something stronger.  Wear the ankle brace for ankle sprain, wrist brace on the left for now, gentle range of motion as tolerated in other joints.  Recheck next week, sooner if any worsening symptoms.  If any headache or other new symptoms, return here or emergency room if needed.   Ankle Sprain An ankle sprain is a stretch or tear in one of the tough, fiber-like tissues (ligaments) in the ankle. The ligaments in your ankle help to hold the bones of the ankle together. What are the causes? This condition is often caused by stepping on or falling on the outer edge of the foot. What increases the risk? This condition is more likely to develop in people who play sports. What are the signs or symptoms? Symptoms of this condition include:  Pain in your ankle.  Swelling.  Bruising. Bruising may develop right after you sprain your ankle or 1-2 days later.  Trouble standing or walking, especially when you turn or change directions.  How is this diagnosed? This condition is diagnosed with a physical exam. During the exam, your health care provider will press on certain parts of your foot and ankle and try to move them in certain ways. X-rays may be taken to see how severe the sprain is and to check for broken bones. How is this treated? This condition may be treated with:  A brace. This is used to keep the ankle from moving until it heals.  An elastic bandage. This is used to support the ankle.  Crutches.  Pain medicine.  Surgery. This may be needed if the  sprain is severe.  Physical therapy. This may help to improve the range of motion in the ankle.  Follow these instructions at home:  Rest your ankle.  Take over-the-counter and prescription medicines only as told by your health care provider.  For 2-3 days, keep your ankle raised (elevated) above the level of your heart as much as possible.  If directed, apply ice to the area: ? Put ice in a plastic bag. ? Place a towel between your skin and the bag. ? Leave the ice on for 20 minutes, 2-3 times a day.  If you were given a brace: ? Wear it as directed. ? Remove it to shower or bathe. ? Try not to move your ankle much, but wiggle your toes from time to time. This helps to prevent swelling.  If you were given an elastic bandage (dressing): ? Remove it to shower or bathe. ? Try not to move your ankle much, but wiggle your toes from time to time. This helps to prevent swelling. ? Adjust the dressing to make it more comfortable if it feels too tight. ? Loosen the dressing if you have numbness or tingling in your foot, or if your foot becomes cold and blue.  If you have crutches, use them as told by your health care provider. Continue to use them until you can walk without feeling pain in your ankle. Contact a health care provider if:  You have rapidly  increasing bruising or swelling.  Your pain is not relieved with medicine. Get help right away if:  Your toes or foot becomes numb or blue.  You have severe pain that gets worse. This information is not intended to replace advice given to you by your health care provider. Make sure you discuss any questions you have with your health care provider. Document Released: 03/16/2005 Document Revised: 07/24/2015 Document Reviewed: 10/16/2014 Elsevier Interactive Patient Education  2017 [employer]  Chest Wall Pain Chest wall pain is pain in or around the bones and muscles of your chest. Sometimes, an injury causes this pain. Sometimes,  the cause may not be known. This pain may take several weeks or longer to get better. Follow these instructions at home: Pay attention to any changes in your symptoms. Take these actions to help with your pain:  Rest as told by your health care provider.  Avoid activities that cause pain. These include any activities that use your chest muscles or your abdominal and side muscles to lift heavy items.  If directed, apply ice to the painful area: ? Put ice in a plastic bag. ? Place a towel between your skin and the bag. ? Leave the ice on for 20 minutes, 2-3 times per day.  Take over-the-counter and prescription medicines only as told by your health care provider.  Do not use tobacco products, including cigarettes, chewing tobacco, and e-cigarettes. If you need help quitting, ask your health care provider.  Keep all follow-up visits as told by your health care provider. This is important.  Contact a health care provider if:  You have a fever.  Your chest pain becomes worse.  You have new symptoms. Get help right away if:  You have nausea or vomiting.  You feel sweaty or light-headed.  You have a cough with phlegm (sputum) or you cough up blood.  You develop shortness of breath. This information is not intended to replace advice given to you by your health care provider. Make sure you discuss any questions you have with your health care provider. Document Released: 03/16/2005 Document Revised: 07/25/2015 Document Reviewed: 06/11/2014 Elsevier Interactive Patient Education  2017 [place] A contusion is a deep bruise. Contusions are the result of a blunt injury to tissues and muscle fibers under the skin. The injury causes bleeding under the skin. The skin overlying the contusion may turn blue, purple, or yellow. Minor injuries will give you a painless contusion, but more severe contusions may stay painful and swollen for a few weeks. What are the causes? This  condition is usually caused by a blow, trauma, or direct force to an area of the body. What are the signs or symptoms? Symptoms of this condition include:  Swelling of the injured area.  Pain and tenderness in the injured area.  Discoloration. The area may have redness and then turn blue, purple, or yellow.  How is this diagnosed? This condition is diagnosed based on a physical exam and medical history. An X-ray, CT scan, or MRI may be needed to determine if there are any associated injuries, such as broken bones (fractures). How is this treated? Specific treatment for this condition depends on what area of the body was injured. In general, the best treatment for a contusion is resting, icing, applying pressure to (compression), and elevating the injured area. This is often called the RICE strategy. Over-the-counter anti-inflammatory medicines may also be recommended for pain control. Follow these instructions at home:  Rest the  injured area.  If directed, apply ice to the injured area: ? Put ice in a plastic bag. ? Place a towel between your skin and the bag. ? Leave the ice on for 20 minutes, 2-3 times per day.  If directed, apply light compression to the injured area using an elastic bandage. Make sure the bandage is not wrapped too tightly. Remove and reapply the bandage as directed by your health care provider.  If possible, raise (elevate) the injured area above the level of your heart while you are sitting or lying down.  Take over-the-counter and prescription medicines only as told by your health care provider. Contact a health care provider if:  Your symptoms do not improve after several days of treatment.  Your symptoms get worse.  You have difficulty moving the injured area. Get help right away if:  You have severe pain.  You have numbness in a hand or foot.  Your hand or foot turns pale or cold. This information is not intended to replace advice given to you by  your health care provider. Make sure you discuss any questions you have with your health care provider. Document Released: 12/24/2004 Document Revised: 07/25/2015 Document Reviewed: 08/01/2014 Elsevier Interactive Patient Education  2017 [employer].    IF you received an x-ray today, you will receive an invoice from [facility]. Please contact [facility] at [phone] with questions or concerns regarding your invoice.   IF you received labwork today, you will receive an invoice from [facility]. Please contact [facility] at [phone] with questions or concerns regarding your invoice.   Our billing staff will not be able to assist you with questions regarding bills from these companies.  You will be contacted with the lab results as soon as they are available. The fastest way to get your results is to activate your My Chart account. Instructions are located on the last page of this paperwork. If you have not heard from [place] regarding the results in 2 weeks, please contact this office.

## 2016-12-22 NOTE — Progress Notes (Signed)
Subjective:  By signing my name below, I, Bradley Oconnor, attest that this documentation has been prepared under the direction and in the presence of Bradley Agreste, MD Electronically Signed: Ladene Oconnor, ED Scribe 12/22/2016 at 11:25 AM.   Patient ID: Bradley Oconnor, male    DOB: [birth year], 76 y.o.   MRN: [id number]  Chief Complaint  Patient presents with  . fall arm,shoulder,ribs injuried   HPI Bradley Oconnor is a 76 y.o. male, with a h/o CAD, HTN, hypothyroidism, hyperlipidemia, GERD, who presents to Primary Care at [facility] after a slip and fall that occurred 3 days ago. Pt states that he slipped on leaves while walking into the [facility] [place]. He reports injuring his left forearm, both shoulders, left ribs, left side of his face, right lower extremity, left ankle and reports several bruises. Denies head injury or LOC. Pt was evaluated by the medical service at the stadium. Today he reports generalized myalgias but reports the most pain in left ankle, both shoulders and left ribs, especially with lifting. No medications tried PTA. Denies weakness in extremities, difficulty ambulating, weakness in hands/wrists, neck pain or back pain, visual disturbances, blurred/double vision, sob, difficulty breath, cough, hemoptysis, HA, light-headedness, dizziness.  Patient Active Problem List   Diagnosis Date Noted  . GERD (gastroesophageal reflux disease) 04/10/2013  . HTN (hypertension) 04/10/2013  . CAD (coronary artery disease) of artery bypass graft 10/04/2012  . Hypothyroidism 10/04/2012  . Hyperlipidemia with target LDL less than 70 10/04/2012  . Kidney stones 01/27/2012  . Gallstones-symptomatic 01/27/2012  . Hx of appendectomy-history of ruptured requiring ileocecectomy (~1994) 01/27/2012   Past Medical History:  Diagnosis Date  . Blood transfusion without reported diagnosis   . CAD (coronary artery disease)   . GERD (gastroesophageal reflux disease)   . Heart attack ([place])    . Hyperlipidemia   . Hypertension 03/07/12   ECHO-WNL     08/12/11 Lexiscan MyoviewNo significant ischemia demonstrated Low risk scan There is a moderate sized dense scar in the LCX territoy unchanged from the prior study.. Post- stress EF is 40%.  . Thyroid disease    Past Surgical History:  Procedure Laterality Date  . APPENDECTOMY    . CERVICAL SPINE SURGERY     titanium plate in the back of neck  . CORONARY ARTERY BYPASS GRAFT    . HERNIA REPAIR    . SMALL INTESTINE SURGERY    . 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 Procardia [Nifedipine] Other (See Comments)    Lowers bp   . Phenergan [Promethazine Hcl] Nausea And Vomiting   Prior to Admission medications   Medication Sig Start Date End Date Taking? Authorizing Provider  amLODipine (NORVASC) 5 MG tablet TAKE ONE TABLET BY MOUTH ONCE DAILY 07/06/16  Yes Troy Sine, MD  aspirin EC 81 MG tablet Take 1 tablet (81 mg total) by mouth daily. 04/06/16  Yes Troy Sine, MD  fish oil-omega-3 fatty acids 1000 MG capsule Take 1 g by mouth daily.    Yes [provider]  Garlic 1829 MG CAPS Take 1 capsule by mouth daily.    Yes [provider]  hydrochlorothiazide (MICROZIDE) 12.5 MG capsule Take 1 capsule (12.5 mg total) by mouth daily as needed (fluid and edema). 05/29/16  Yes Troy Sine, MD  isosorbide mononitrate (IMDUR) 120 MG 24 hr tablet TAKE ONE TABLET BY MOUTH ONCE DAILY 04/07/16  Yes Troy Sine, MD  lansoprazole (PREVACID) 15 MG capsule Take 15 mg by mouth daily.   Yes [provider]  levothyroxine (SYNTHROID, LEVOTHROID) 100 MCG tablet TAKE ONE TABLET BY MOUTH ONCE DAILY IN THE MORNING BEFORE BREAKFAST 04/07/16  Yes Troy Sine, MD  metoprolol succinate (TOPROL-XL) 100 MG 24 hr tablet Take 1 and 1/4 tablet by mouth once daily. 05/29/16  Yes Troy Sine, MD  nitroGLYCERIN (NITROSTAT) 0.4 MG SL tablet DISSOLVE ONE TABLET UNDER THE TONGUE  EVERY 5 MINUTES AS NEEDED FOR CHEST PAIN.  DO NOT EXCEED A TOTAL OF 3 DOSES IN 15 MINUTES 11/20/16  Yes Troy Sine, MD  NITROSTAT 0.4 MG SL tablet DISSOLVE ONE TABLET UNDER THE TONGUE EVERY 5 MINUTES AS NEEDED FOR CHEST PAIN.  DO NOT EXCEED A TOTAL OF 3 DOSES IN 15 MINUTES 11/20/16  Yes Troy Sine, MD  ramipril (ALTACE) 10 MG capsule TAKE ONE CAPSULE BY MOUTH ONCE DAILY 04/07/16  Yes Troy Sine, MD  RANEXA 1000 MG SR tablet TAKE ONE TABLET BY MOUTH TWICE DAILY 07/30/16  Yes Troy Sine, MD  rosuvastatin (CRESTOR) 20 MG tablet Take 1 tablet (20 mg total) by mouth every evening. 11/26/15  Yes Troy Sine, MD   Social History   Social History  . Marital status: Married    Spouse name: N/A  . Number of children: N/A  . Years of education: 5   Occupational History  . Retired    Social History Main Topics  . Smoking status: Never Smoker  . Smokeless tobacco: Never Used  . Alcohol use No  . Drug use: No  . Sexual activity: No   Other Topics Concern  . Not on file   Social History Narrative   Married.    Review of Systems  Eyes: Negative for visual disturbance.  Respiratory: Negative for cough and shortness of breath.   Musculoskeletal: Positive for arthralgias and myalgias. Negative for back pain, gait problem and neck pain.  Skin: Positive for color change.  Neurological: Negative for dizziness, weakness, light-headedness and headaches.      Objective:   Physical Exam  Constitutional: He is oriented to person, place, and time. He appears well-developed and well-nourished. No distress.  HENT:  Head: Normocephalic and atraumatic. Head is without Battle's sign.  Right Ear: No hemotympanum.  Left Ear: No hemotympanum.  Resolving bruising on superior and inferior aspect of L orbit area. No apparent wounds other than faint abrasion L cheek without surrounding erythema. Tender along the superior orbit and inferior orbit, at maxillary sinus without apparent deformity.  Some bruising on eyelid but no lack.  Eyes: Conjunctivae and EOM are normal.  Neck: Neck supple. No tracheal deviation present.  Cardiovascular: Normal rate and regular rhythm.   Pulmonary/Chest: Effort normal and breath sounds normal. No respiratory distress.  Lungs are clear with breath sounds equal in all fields. Chest wall: Tender along mid chest wall on L anterior axillary line. Skin intact.   Musculoskeletal: Normal range of motion.  No focal bony tenderness of C, T or L spine.  L shoulder: Abrasion along L anterior aspect of L upper arm with faint bruising. Clavicle and AC nontender. Tender into soft tissue of L shoulder. Upper arm: humerus is nontender. Full flexion. Full ABduction with some discomfort. Full internal rotation. Negative empty can, no weakness. Full rotator cuff strength.  L elbow: full ROM. No bony tenderness.  L forearm: large area of ecchymosis on dorsal forearm  with few small abrasions. No surrounding erythema or discharge. Tender along mid radius and soft tissue of forearm dorsally.  L wrist: tender along distal radius. Scaphoid is nontender. Hand and tender nontender. Cap refill less than 1 sec.  R elbow: small ecchymosis over lateral aspect. Full ROM.  R shoulder: clavicle AC is nontender. Tenderness along anterior shoulder. Skin is intact. Negative empty can. Full ROM. Full rotator cuff strength. R knee: small abrasion but pain free ROM. No bony tenderness.  R LE: ecchymosis. tender along mid tibia medially. Fibula is nontender. Diffuse soft tissue swelling with superficial abrasions. No surrounding erythema or discharge. L ankle: skin intact. Possible slight soft tissue swelling of the ankle. Tender along on the lateral malleolus. Foot is nontender including navicular and metatarsal. Pain with dorsiflexion. Pain with eversion but ROM intact. NVI into the toes.   Neurological: He is alert and oriented to person, place, and time.  Skin: Skin is warm and dry.    Psychiatric: He has a normal mood and affect. His behavior is normal.  Nursing note and vitals reviewed.  Vitals:   12/22/16 1042  BP: 124/64  Pulse: (!) 52  Resp: 17  Temp: 97.6 F (36.4 C)  TempSrc: Oral  SpO2: 98%  Weight: 201 lb (91.2 kg)  Height: 5' 7.5" (1.715 m)   Dg Ribs Unilateral W/chest Left  Result Date: 12/22/2016 CLINICAL DATA:  Status post fall 3 days ago with persistent left rib discomfort. EXAM: LEFT RIBS AND CHEST - 3+ VIEW COMPARISON:  Chest x-ray of August 30, 2009 FINDINGS: The lungs are reasonably well inflated. There are linear increased densities noted in the left lower lung and in the right perihilar region. There is no pleural effusion or pneumothorax. The cardiac silhouette is top-normal in size. The pulmonary vascularity is normal. There is calcification in the wall of the aortic arch. There is tortuosity of the descending thoracic aorta. The patient has undergone previous median sternotomy. Left rib detail images reveal no acute displaced fractures. IMPRESSION: Subsegmental atelectasis versus scarring in the right hilar and left infrahilar regions. No pulmonary edema or pulmonary contusion. Mild cardiomegaly. Previous CABG. No acute left rib fracture is observed. Electronically Signed   By: David  Martinique M.D.   On: 12/22/2016 12:44   Dg Shoulder Right  Result Date: 12/22/2016 CLINICAL DATA:  Acute right shoulder pain after fall 3 days ago. EXAM: RIGHT SHOULDER - 2+ VIEW COMPARISON:  None. FINDINGS: There is no evidence of fracture or dislocation. There is no evidence of arthropathy or other focal bone abnormality. Soft tissues are unremarkable. IMPRESSION: Normal right shoulder. Electronically Signed   By: Marijo Conception, M.D.   On: 12/22/2016 12:45   Dg Forearm Left  Result Date: 12/22/2016 CLINICAL DATA:  Acute left forearm pain after fall 3 days ago. EXAM: LEFT FOREARM - 2 VIEW COMPARISON:  None. FINDINGS: There is no evidence of fracture or other focal bone  lesions. Soft tissues are unremarkable. IMPRESSION: Normal left forearm. Electronically Signed   By: Marijo Conception, M.D.   On: 12/22/2016 12:46   Dg Wrist Complete Left  Result Date: 12/22/2016 CLINICAL DATA:  Left wrist pain after fall 3 days ago. EXAM: LEFT WRIST - COMPLETE 3+ VIEW COMPARISON:  None. FINDINGS: There is no evidence of fracture or dislocation. There is no evidence of arthropathy or other focal bone abnormality. Soft tissues are unremarkable. IMPRESSION: Normal left wrist. Electronically Signed   By: Marijo Conception, M.D.   On: 12/22/2016  12:48   Dg Tibia/fibula Right  Result Date: 12/22/2016 CLINICAL DATA:  Acute right lower leg pain after fall 3 days ago. EXAM: RIGHT TIBIA AND FIBULA - 2 VIEW COMPARISON:  None. FINDINGS: There is no evidence of fracture or other focal bone lesions. Soft tissues are unremarkable. IMPRESSION: Normal right tibia and fibula. Electronically Signed   By: Marijo Conception, M.D.   On: 12/22/2016 12:49   Dg Ankle Complete Left  Result Date: 12/22/2016 CLINICAL DATA:  Left ankle pain after fall 3 days ago. EXAM: LEFT ANKLE COMPLETE - 3+ VIEW COMPARISON:  None. FINDINGS: There is no evidence of fracture, dislocation, or joint effusion. There is no evidence of arthropathy or other focal bone abnormality. Soft tissue swelling is seen over lateral malleolus. IMPRESSION: No fracture or dislocation is noted. Soft tissue swelling is seen over lateral malleolus suggesting ligamentous injury. Electronically Signed   By: Marijo Conception, M.D.   On: 12/22/2016 12:51   Dg Shoulder Left  Result Date: 12/22/2016 CLINICAL DATA:  Fall 3 days ago.  Shoulder pain EXAM: LEFT SHOULDER - 2+ VIEW COMPARISON:  None. FINDINGS: There is no evidence of fracture or dislocation. There is no evidence of arthropathy or other focal bone abnormality. Soft tissues are unremarkable. IMPRESSION: Negative. Electronically Signed   By: Franchot Gallo M.D.   On: 12/22/2016 12:44        Assessment & Plan:   ZEDRICK SPRINGSTEEN is a 76 y.o. male Fall, initial encounter - Plan: CT Maxillofacial WO CM, DG Shoulder Left, DG Shoulder Right, DG Forearm Left, DG Wrist Complete Left, DG Ribs Unilateral W/Chest Left, DG Tibia/Fibula Right, DG Ankle Complete Left  Orbital pain, left - Plan: CT Maxillofacial WO CM  Multiple contusions  Acute pain of left shoulder - Plan: DG Shoulder Left  Pain in joint of right shoulder - Plan: DG Shoulder Right  Rib pain on left side - Plan: DG Ribs Unilateral W/Chest Left  Left forearm pain - Plan: DG Forearm Left  Left wrist pain - Plan: DG Wrist Complete Left, Splint wrist  Pain of right tibia - Plan: DG Tibia/Fibula Right  Sprain of left ankle, unspecified ligament, initial encounter - Plan: DG Ankle Complete Left, Apply ASO ankle  Multiple contusions as above with left ankle sprain, left rib contusion, left face contusion, possible shoulder sprain/rotator cuff strain versus contusions of shoulder, and facial contusions.  -X-ray without signs of fracture.  CT scan of facial bones pending  -Symptomatic care discussed, lace up ankle brace for left ankle sprain and handout given.  - tylenol for now, as declined stronger med, but could consider hydrocodone or ultram short term if needed.   - recheck in 1 week. Sooner if worse.   No orders of the defined types were placed in this encounter.  Patient Instructions   You are schedule for CT exam today at [facility] [place]. Please arrive at the main entrance to the hospital 15 minutes prior to your appointment time and they will guide you to the radiology department.   See information below on contusions and chest wall pain. Additionally I printed information on ankle sprain. Tylenol is safest for pain,, but let me know if you need a stronger medication and I will be happy to prescribe something stronger.  Wear the ankle brace for ankle sprain, wrist brace on the left for now, gentle  range of motion as tolerated in other joints.  Recheck next week, sooner if any worsening  symptoms.  If any headache or other new symptoms, return here or emergency room if needed.   Ankle Sprain An ankle sprain is a stretch or tear in one of the tough, fiber-like tissues (ligaments) in the ankle. The ligaments in your ankle help to hold the bones of the ankle together. What are the causes? This condition is often caused by stepping on or falling on the outer edge of the foot. What increases the risk? This condition is more likely to develop in people who play sports. What are the signs or symptoms? Symptoms of this condition include:  Pain in your ankle.  Swelling.  Bruising. Bruising may develop right after you sprain your ankle or 1-2 days later.  Trouble standing or walking, especially when you turn or change directions.  How is this diagnosed? This condition is diagnosed with a physical exam. During the exam, your health care provider will press on certain parts of your foot and ankle and try to move them in certain ways. X-rays may be taken to see how severe the sprain is and to check for broken bones. How is this treated? This condition may be treated with:  A brace. This is used to keep the ankle from moving until it heals.  An elastic bandage. This is used to support the ankle.  Crutches.  Pain medicine.  Surgery. This may be needed if the sprain is severe.  Physical therapy. This may help to improve the range of motion in the ankle.  Follow these instructions at home:  Rest your ankle.  Take over-the-counter and prescription medicines only as told by your health care provider.  For 2-3 days, keep your ankle raised (elevated) above the level of your heart as much as possible.  If directed, apply ice to the area: ? Put ice in a plastic bag. ? Place a towel between your skin and the bag. ? Leave the ice on for 20 minutes, 2-3 times a day.  If you were given a  brace: ? Wear it as directed. ? Remove it to shower or bathe. ? Try not to move your ankle much, but wiggle your toes from time to time. This helps to prevent swelling.  If you were given an elastic bandage (dressing): ? Remove it to shower or bathe. ? Try not to move your ankle much, but wiggle your toes from time to time. This helps to prevent swelling. ? Adjust the dressing to make it more comfortable if it feels too tight. ? Loosen the dressing if you have numbness or tingling in your foot, or if your foot becomes cold and blue.  If you have crutches, use them as told by your health care provider. Continue to use them until you can walk without feeling pain in your ankle. Contact a health care provider if:  You have rapidly increasing bruising or swelling.  Your pain is not relieved with medicine. Get help right away if:  Your toes or foot becomes numb or blue.  You have severe pain that gets worse. This information is not intended to replace advice given to you by your health care provider. Make sure you discuss any questions you have with your health care provider. Document Released: 03/16/2005 Document Revised: 07/24/2015 Document Reviewed: 10/16/2014 Elsevier Interactive Patient Education  2017 [employer]  Chest Wall Pain Chest wall pain is pain in or around the bones and muscles of your chest. Sometimes, an injury causes this pain. Sometimes, the cause may not be known.  This pain may take several weeks or longer to get better. Follow these instructions at home: Pay attention to any changes in your symptoms. Take these actions to help with your pain:  Rest as told by your health care provider.  Avoid activities that cause pain. These include any activities that use your chest muscles or your abdominal and side muscles to lift heavy items.  If directed, apply ice to the painful area: ? Put ice in a plastic bag. ? Place a towel between your skin and the bag. ? Leave  the ice on for 20 minutes, 2-3 times per day.  Take over-the-counter and prescription medicines only as told by your health care provider.  Do not use tobacco products, including cigarettes, chewing tobacco, and e-cigarettes. If you need help quitting, ask your health care provider.  Keep all follow-up visits as told by your health care provider. This is important.  Contact a health care provider if:  You have a fever.  Your chest pain becomes worse.  You have new symptoms. Get help right away if:  You have nausea or vomiting.  You feel sweaty or light-headed.  You have a cough with phlegm (sputum) or you cough up blood.  You develop shortness of breath. This information is not intended to replace advice given to you by your health care provider. Make sure you discuss any questions you have with your health care provider. Document Released: 03/16/2005 Document Revised: 07/25/2015 Document Reviewed: 06/11/2014 Elsevier Interactive Patient Education  2017 [place] A contusion is a deep bruise. Contusions are the result of a blunt injury to tissues and muscle fibers under the skin. The injury causes bleeding under the skin. The skin overlying the contusion may turn blue, purple, or yellow. Minor injuries will give you a painless contusion, but more severe contusions may stay painful and swollen for a few weeks. What are the causes? This condition is usually caused by a blow, trauma, or direct force to an area of the body. What are the signs or symptoms? Symptoms of this condition include:  Swelling of the injured area.  Pain and tenderness in the injured area.  Discoloration. The area may have redness and then turn blue, purple, or yellow.  How is this diagnosed? This condition is diagnosed based on a physical exam and medical history. An X-ray, CT scan, or MRI may be needed to determine if there are any associated injuries, such as broken bones  (fractures). How is this treated? Specific treatment for this condition depends on what area of the body was injured. In general, the best treatment for a contusion is resting, icing, applying pressure to (compression), and elevating the injured area. This is often called the RICE strategy. Over-the-counter anti-inflammatory medicines may also be recommended for pain control. Follow these instructions at home:  Rest the injured area.  If directed, apply ice to the injured area: ? Put ice in a plastic bag. ? Place a towel between your skin and the bag. ? Leave the ice on for 20 minutes, 2-3 times per day.  If directed, apply light compression to the injured area using an elastic bandage. Make sure the bandage is not wrapped too tightly. Remove and reapply the bandage as directed by your health care provider.  If possible, raise (elevate) the injured area above the level of your heart while you are sitting or lying down.  Take over-the-counter and prescription medicines only as told by your health care provider. Contact a  health care provider if:  Your symptoms do not improve after several days of treatment.  Your symptoms get worse.  You have difficulty moving the injured area. Get help right away if:  You have severe pain.  You have numbness in a hand or foot.  Your hand or foot turns pale or cold. This information is not intended to replace advice given to you by your health care provider. Make sure you discuss any questions you have with your health care provider. Document Released: 12/24/2004 Document Revised: 07/25/2015 Document Reviewed: 08/01/2014 Elsevier Interactive Patient Education  2017 [employer].    IF you received an x-ray today, you will receive an invoice from [facility]. Please contact [facility] at [phone] with questions or concerns regarding your invoice.   IF you received labwork today, you will receive an invoice from [facility].  Please contact [facility] at [phone] with questions or concerns regarding your invoice.   Our billing staff will not be able to assist you with questions regarding bills from these companies.  You will be contacted with the lab results as soon as they are available. The fastest way to get your results is to activate your My Chart account. Instructions are located on the last page of this paperwork. If you have not heard from [place] regarding the results in 2 weeks, please contact this office.       I personally performed the services described in this documentation, which was scribed in my presence. The recorded information has been reviewed and considered for accuracy and completeness, addended by me as needed, and agree with information above.  Signed,   Merri Ray, MD Primary [facility] at [place].  12/23/16 2:53 PM

## 2016-12-22 NOTE — Progress Notes (Signed)
nal

## 2016-12-28 ENCOUNTER — Telehealth: Payer: Self-pay | Admitting: Family Medicine

## 2016-12-28 NOTE — Telephone Encounter (Signed)
Message left for referrals from [facility] from the [place] stating pt needed prior auth for CT w/o contrast. I went to initiate a retro auth for this and it said the pt no showed this appt. I asked Tillie Rung about this and she stated it looked as if the pt no showed as well. I have initiate a stat prior auth via fax with [employer] in case this CT needs to be rescheduled. I spoke with the pt and he said he called our office and had this canceled and was coming in to see Dr. Carlota Raspberry this week. He said it was okay to initiate the prior auth in case he ends up proceeding with it. Thanks.

## 2016-12-31 ENCOUNTER — Ambulatory Visit (INDEPENDENT_AMBULATORY_CARE_PROVIDER_SITE_OTHER): Payer: PPO | Admitting: Family Medicine

## 2016-12-31 ENCOUNTER — Encounter: Payer: Self-pay | Admitting: Family Medicine

## 2016-12-31 ENCOUNTER — Ambulatory Visit (INDEPENDENT_AMBULATORY_CARE_PROVIDER_SITE_OTHER): Payer: PPO

## 2016-12-31 VITALS — BP 104/58 | HR 62 | Temp 98.3°F | Resp 16 | Ht 67.5 in | Wt 196.8 lb

## 2016-12-31 DIAGNOSIS — S93402D Sprain of unspecified ligament of left ankle, subsequent encounter: Secondary | ICD-10-CM | POA: Diagnosis not present

## 2016-12-31 DIAGNOSIS — S20212D Contusion of left front wall of thorax, subsequent encounter: Secondary | ICD-10-CM

## 2016-12-31 DIAGNOSIS — Z23 Encounter for immunization: Secondary | ICD-10-CM

## 2016-12-31 DIAGNOSIS — J9811 Atelectasis: Secondary | ICD-10-CM | POA: Diagnosis not present

## 2016-12-31 DIAGNOSIS — T07XXXA Unspecified multiple injuries, initial encounter: Secondary | ICD-10-CM | POA: Diagnosis not present

## 2016-12-31 MED ORDER — TRAMADOL HCL 50 MG PO TABS: 50.0000 mg | ORAL_TABLET | Freq: Four times a day (QID) | ORAL | 0 refills | Status: DC | PRN

## 2016-12-31 NOTE — Patient Instructions (Addendum)
See information on chest wall pain.  No fracture seen last time, but if any worsening pain or shortness of breath - return possible ct scan. Tramadol if needed.   Continue range of motion for left ankle sprain. Brace as needed. Recheck in 2 weeks. If you worsen before that time, I'm happy to refer you to orthopedics.  Other contusions should continue to improve.  Return to the clinic or go to the nearest emergency room if any of your symptoms worsen or new symptoms occur.    IF you received an x-ray today, you will receive an invoice from [facility]. Please contact [facility] at [phone] with questions or concerns regarding your invoice.   IF you received labwork today, you will receive an invoice from [place]. Please contact [facility] at [phone] with questions or concerns regarding your invoice.   Our billing staff will not be able to assist you with questions regarding bills from these companies.  You will be contacted with the lab results as soon as they are available. The fastest way to get your results is to activate your My Chart account. Instructions are located on the last page of this paperwork. If you have not heard from [place] regarding the results in 2 weeks, please contact this office.

## 2016-12-31 NOTE — Progress Notes (Signed)
Subjective:  By signing my name below, I, Moises Blood, attest that this documentation has been prepared under the direction and in the presence of Merri Ray, MD. Electronically Signed: Moises Blood, [place]. 12/31/2016 , 3:25 PM .  Patient was seen in Room 12 .   Patient ID: Bradley Oconnor, male    DOB: 1940-11-11, 76 y.o.   MRN: [id number] Chief Complaint  Patient presents with  . Follow-up    fall on 9/22   HPI Bradley Oconnor is a 76 y.o. male  Patient is here for follow up after date of injury fall on 9/22. He was seen on 9/25, and multiple areas addressed, suspected contusions. Recommended CT of facial bones but did not have that performed. He was advised tylenol for pain. He was also treated for a left ankle sprain. He had left rib contusion with xray negative for fracture, but he states it hurts when he goes to sleep. He denies shortness of breath, new cough, or fever. He had applied Bio-Freeze over the area for relief.   He's been wearing the ankle brace, but notes more pain when wearing it. He does some range of motion stretches and exercises when resting and watching TV.   He states his facial contusions have improved. He denies double vision or headaches. He does mention having some neck stiffness but unchanged. He denies any weakness in his extremities.   Patient Active Problem List   Diagnosis Date Noted  . GERD (gastroesophageal reflux disease) 04/10/2013  . HTN (hypertension) 04/10/2013  . CAD (coronary artery disease) of artery bypass graft 10/04/2012  . Hypothyroidism 10/04/2012  . Hyperlipidemia with target LDL less than 70 10/04/2012  . Kidney stones 01/27/2012  . Gallstones-symptomatic 01/27/2012  . Hx of appendectomy-history of ruptured requiring ileocecectomy (~1994) 01/27/2012   Past Medical History:  Diagnosis Date  . Blood transfusion without reported diagnosis   . CAD (coronary artery disease)   . GERD (gastroesophageal reflux disease)   .  Heart attack ([place])   . Hyperlipidemia   . Hypertension 03/07/12   ECHO-WNL     08/12/11 Lexiscan MyoviewNo significant ischemia demonstrated Low risk scan There is a moderate sized dense scar in the LCX territoy unchanged from the prior study.. Post- stress EF is 40%.  . Thyroid disease    Past Surgical History:  Procedure Laterality Date  . APPENDECTOMY    . CERVICAL SPINE SURGERY     titanium plate in the back of neck  . CORONARY ARTERY BYPASS GRAFT    . HERNIA REPAIR    . SMALL INTESTINE SURGERY    . 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 Procardia [Nifedipine] Other (See Comments)    Lowers bp   . Phenergan [Promethazine Hcl] Nausea And Vomiting   Prior to Admission medications   Medication Sig Start Date End Date Taking? Authorizing Provider  amLODipine (NORVASC) 5 MG tablet TAKE ONE TABLET BY MOUTH ONCE DAILY 07/06/16  Yes Troy Sine, MD  aspirin EC 81 MG tablet Take 1 tablet (81 mg total) by mouth daily. 04/06/16  Yes Troy Sine, MD  fish oil-omega-3 fatty acids 1000 MG capsule Take 1 g by mouth daily.    Yes [provider]  Garlic 3419 MG CAPS Take 1 capsule by mouth daily.    Yes [provider]  hydrochlorothiazide (MICROZIDE) 12.5 MG capsule Take 1 capsule (12.5 mg total) by mouth  daily as needed (fluid and edema). 05/29/16  Yes Troy Sine, MD  isosorbide mononitrate (IMDUR) 120 MG 24 hr tablet TAKE ONE TABLET BY MOUTH ONCE DAILY 04/07/16  Yes Troy Sine, MD  lansoprazole (PREVACID) 15 MG capsule Take 15 mg by mouth daily.   Yes [provider]  levothyroxine (SYNTHROID, LEVOTHROID) 100 MCG tablet TAKE ONE TABLET BY MOUTH ONCE DAILY IN THE MORNING BEFORE BREAKFAST 04/07/16  Yes Troy Sine, MD  metoprolol succinate (TOPROL-XL) 100 MG 24 hr tablet Take 1 and 1/4 tablet by mouth once daily. 05/29/16  Yes Troy Sine, MD  nitroGLYCERIN (NITROSTAT) 0.4 MG SL tablet DISSOLVE ONE  TABLET UNDER THE TONGUE EVERY 5 MINUTES AS NEEDED FOR CHEST PAIN.  DO NOT EXCEED A TOTAL OF 3 DOSES IN 15 MINUTES 11/20/16  Yes Troy Sine, MD  NITROSTAT 0.4 MG SL tablet DISSOLVE ONE TABLET UNDER THE TONGUE EVERY 5 MINUTES AS NEEDED FOR CHEST PAIN.  DO NOT EXCEED A TOTAL OF 3 DOSES IN 15 MINUTES 11/20/16  Yes Troy Sine, MD  ramipril (ALTACE) 10 MG capsule TAKE ONE CAPSULE BY MOUTH ONCE DAILY 04/07/16  Yes Troy Sine, MD  RANEXA 1000 MG SR tablet TAKE ONE TABLET BY MOUTH TWICE DAILY 07/30/16  Yes Troy Sine, MD  rosuvastatin (CRESTOR) 20 MG tablet Take 1 tablet (20 mg total) by mouth every evening. 11/26/15  Yes Troy Sine, MD   Social History   Social History  . Marital status: Married    Spouse name: N/A  . Number of children: N/A  . Years of education: 57   Occupational History  . Retired    Social History Main Topics  . Smoking status: Never Smoker  . Smokeless tobacco: Never Used  . Alcohol use No  . Drug use: No  . Sexual activity: No   Other Topics Concern  . Not on file   Social History Narrative   Married.    Review of Systems  Constitutional: Negative for fatigue, fever and unexpected weight change.  Eyes: Negative for visual disturbance.  Respiratory: Negative for cough, chest tightness and shortness of breath.   Cardiovascular: Negative for chest pain, palpitations and leg swelling.  Gastrointestinal: Negative for abdominal pain and blood in stool.  Musculoskeletal: Positive for arthralgias, myalgias and neck stiffness. Negative for gait problem and joint swelling.  Neurological: Negative for dizziness, weakness, light-headedness and headaches.       Objective:   Physical Exam  Constitutional: He is oriented to person, place, and time. He appears well-developed and well-nourished.  HENT:  Head: Normocephalic and atraumatic.  Faint resolution of ecchymosis around left orbit; faint orbital tenderness  Eyes: Pupils are equal, round, and  reactive to light. EOM are normal.  Neck: No JVD present. Carotid bruit is not present.  Cardiovascular: Normal rate, regular rhythm and normal heart sounds.   No murmur heard. Pulmonary/Chest: Effort normal. He has rales in the left lower field.  Chest wall: Tender along the lateral rib margin, no subq emphysema, no ecchymosis, no erythema, negative gray turner Lungs: overall clear, faint rales left lower lobe  Musculoskeletal: He exhibits no edema.  Shoulders: full ROM Left ankle: soft tissue swelling laterally of left ankle  Neurological: He is alert and oriented to person, place, and time.  Skin: Skin is warm and dry.  Psychiatric: He has a normal mood and affect.  Vitals reviewed.   Vitals:   12/31/16 1419  BP: (!) 104/58  Pulse: 62  Resp: 16  Temp: 98.3 F (36.8 C)  SpO2: 95%  Weight: 196 lb 12.8 oz (89.3 kg)  Height: 5' 7.5" (1.715 m)   Dg Chest 2 View  Result Date: 12/31/2016 CLINICAL DATA:  Left rib pain. EXAM: CHEST  2 VIEW COMPARISON:  Radiographs of December 22, 2016. FINDINGS: Stable cardiomediastinal silhouette. Sternotomy wires are noted. Atherosclerosis of thoracic aorta is noted. No pneumothorax or pleural effusion is noted. Stable mild scarring or subsegmental atelectasis is noted in right perihilar region. Stable left basilar subsegmental atelectasis or scarring is noted. Bony thorax is unremarkable. IMPRESSION: Stable bilateral mild subsegmental atelectasis or scarring. Aortic atherosclerosis. No significant change compared to prior exam. Electronically Signed   By: Marijo Conception, M.D.   On: 12/31/2016 15:45       Assessment & Plan:   Bradley Oconnor is a 76 y.o. male Contusion of left chest wall, subsequent encounter - Plan: traMADol (ULTRAM) 50 MG tablet, DG Chest 2 View  - repeat XR stable. Discussed deep breathing for atelectasis. Tramadol if needed - especially at night. rtc precautions.   Need for prophylactic vaccination and inoculation against  influenza - Plan: Flu Vaccine QUAD 6+ mos PF IM (Fluarix Quad PF)  Sprain of left ankle, unspecified ligament, subsequent encounter  - brace if needed. ROM exercises. Consider ortho eval if worse or difficulty with progression.   Multiple contusions  - all appear to be improving. Continued symptom care.face pain improved - will defer CT at this point.  Rtc precautions.   Meds ordered this encounter  Medications  . traMADol (ULTRAM) 50 MG tablet    Sig: Take 1 tablet (50 mg total) by mouth every 6 (six) hours as needed.    Dispense:  20 tablet    Refill:  0   Patient Instructions   See information on chest wall pain.  No fracture seen last time, but if any worsening pain or shortness of breath - return possible ct scan. Tramadol if needed.   Continue range of motion for left ankle sprain. Brace as needed. Recheck in 2 weeks. If you worsen before that time, I'm happy to refer you to orthopedics.  Other contusions should continue to improve.  Return to the clinic or go to the nearest emergency room if any of your symptoms worsen or new symptoms occur.    IF you received an x-ray today, you will receive an invoice from [facility]. Please contact [facility] at [phone] with questions or concerns regarding your invoice.   IF you received labwork today, you will receive an invoice from [place]. Please contact [facility] at [phone] with questions or concerns regarding your invoice.   Our billing staff will not be able to assist you with questions regarding bills from these companies.  You will be contacted with the lab results as soon as they are available. The fastest way to get your results is to activate your My Chart account. Instructions are located on the last page of this paperwork. If you have not heard from [place] regarding the results in 2 weeks, please contact this office.       I personally performed the services described in this documentation,  which was scribed in my presence. The recorded information has been reviewed and considered for accuracy and completeness, addended by me as needed, and agree with information above.  Signed,   Merri Ray, MD [facility] at [place].  01/02/17 11:05 PM

## 2017-01-12 ENCOUNTER — Other Ambulatory Visit: Payer: Self-pay | Admitting: Cardiovascular Disease

## 2017-01-19 ENCOUNTER — Ambulatory Visit (INDEPENDENT_AMBULATORY_CARE_PROVIDER_SITE_OTHER): Payer: PPO | Admitting: Family Medicine

## 2017-01-19 ENCOUNTER — Encounter: Payer: Self-pay | Admitting: Family Medicine

## 2017-01-19 VITALS — BP 124/78 | HR 70 | Temp 98.0°F | Resp 17 | Ht 67.5 in | Wt 198.0 lb

## 2017-01-19 DIAGNOSIS — S93492D Sprain of other ligament of left ankle, subsequent encounter: Secondary | ICD-10-CM | POA: Diagnosis not present

## 2017-01-19 DIAGNOSIS — R0789 Other chest pain: Secondary | ICD-10-CM

## 2017-01-19 NOTE — Patient Instructions (Addendum)
  Continue exercises for ankle sprain. Use the brace only as needed for activities that may be higher risk for turning your ankle. If instability symptoms are not improving with home exercises in the next 4-6 weeks, would recommend physical therapy or possible evaluation with orthopedist.  Chest wall pain should continue to improve, make sure you are taking deep breaths throughout the day, tramadol if needed for episodic pain.  Return to the clinic or go to the nearest emergency room if any of your symptoms worsen or new symptoms occur.    IF you received an x-ray today, you will receive an invoice from [facility]. Please contact [facility] at [phone] with questions or concerns regarding your invoice.   IF you received labwork today, you will receive an invoice from [place]. Please contact [facility] at [phone] with questions or concerns regarding your invoice.   Our billing staff will not be able to assist you with questions regarding bills from these companies.  You will be contacted with the lab results as soon as they are available. The fastest way to get your results is to activate your My Chart account. Instructions are located on the last page of this paperwork. If you have not heard from [place] regarding the results in 2 weeks, please contact this office.

## 2017-01-19 NOTE — Progress Notes (Signed)
Subjective:  This chart was scribed for Wendie Agreste, MD by Tamsen Roers, at [place] at [facility].  This patient was seen in room 2 and the patient's care was started at 10:25 AM.   Chief Complaint  Patient presents with  . Follow-up    ankle pain      Patient ID: Bradley Oconnor, male    DOB: 1940/09/17, 76 y.o.   MRN: [id number]  HPI HPI Comments: Bradley Oconnor is a 76 y.o. male who presents to Primary Care at [facility] for a follow up of ankle pain.  He was last seen October 4th after a initial fall September 22 nd with initial visit September 25 th, was diagnosed with a left ankle sprain, placed in a brace. Home exercises were discussed.  Did note more pain with brace so he was trying exercises without brace.  Other injuries from his fall had improved at his last visit.  --- Patients ankle still bothers him from time to time and now hears a "popping" sound occasionally. Overall, patient does feel that his ankle is "getting better".  He has noticed that the pain increases when taking a full step going up the stairs.  He has been compliant with completing his home exercises daily/working in the yard and really feels like the brace is helping him.    Chest wall contusion: Repeat x-ray last visit: was stable with atelectasis.Tramadol was provided if needed at night and other symptomatic pain.--- He still has chest wall pain intermittently (especially when taking a deep breath) but does feel like its getting "much better".  Patient has taken Tramadol (3 times) when he has increased pain.    Patient Active Problem List   Diagnosis Date Noted  . GERD (gastroesophageal reflux disease) 04/10/2013  . HTN (hypertension) 04/10/2013  . CAD (coronary artery disease) of artery bypass graft 10/04/2012  . Hypothyroidism 10/04/2012  . Hyperlipidemia with target LDL less than 70 10/04/2012  . Kidney stones 01/27/2012  . Gallstones-symptomatic 01/27/2012  . Hx of appendectomy-history of  ruptured requiring ileocecectomy (~1994) 01/27/2012   Past Medical History:  Diagnosis Date  . Blood transfusion without reported diagnosis   . CAD (coronary artery disease)   . GERD (gastroesophageal reflux disease)   . Heart attack ([place])   . Hyperlipidemia   . Hypertension 03/07/12   ECHO-WNL     08/12/11 Lexiscan MyoviewNo significant ischemia demonstrated Low risk scan There is a moderate sized dense scar in the LCX territoy unchanged from the prior study.. Post- stress EF is 40%.  . Thyroid disease    Past Surgical History:  Procedure Laterality Date  . APPENDECTOMY    . CERVICAL SPINE SURGERY     titanium plate in the back of neck  . CORONARY ARTERY BYPASS GRAFT    . HERNIA REPAIR    . SMALL INTESTINE SURGERY    . 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 Procardia [Nifedipine] Other (See Comments)    Lowers bp   . Phenergan [Promethazine Hcl] Nausea And Vomiting   Prior to Admission medications   Medication Sig Start Date End Date Taking? Authorizing Provider  amLODipine (NORVASC) 5 MG tablet TAKE ONE TABLET BY MOUTH ONCE DAILY 07/06/16  Yes Troy Sine, MD  aspirin EC 81 MG tablet Take 1 tablet (81 mg total) by mouth daily. 04/06/16  Yes Troy Sine, MD  fish oil-omega-3 fatty acids 1000  MG capsule Take 1 g by mouth daily.    Yes [provider]  Garlic 0630 MG CAPS Take 1 capsule by mouth daily.    Yes [provider]  hydrochlorothiazide (MICROZIDE) 12.5 MG capsule Take 1 capsule (12.5 mg total) by mouth daily as needed (fluid and edema). 05/29/16  Yes Troy Sine, MD  isosorbide mononitrate (IMDUR) 120 MG 24 hr tablet TAKE ONE TABLET BY MOUTH ONCE DAILY 01/12/17  Yes Troy Sine, MD  lansoprazole (PREVACID) 15 MG capsule Take 15 mg by mouth daily.   Yes [provider]  levothyroxine (SYNTHROID, LEVOTHROID) 100 MCG tablet TAKE ONE TABLET BY MOUTH IN THE MORNING BEFORE BREAKFAST  01/12/17  Yes Troy Sine, MD  metoprolol succinate (TOPROL-XL) 100 MG 24 hr tablet Take 1 and 1/4 tablet by mouth once daily. 05/29/16  Yes Troy Sine, MD  nitroGLYCERIN (NITROSTAT) 0.4 MG SL tablet DISSOLVE ONE TABLET UNDER THE TONGUE EVERY 5 MINUTES AS NEEDED FOR CHEST PAIN.  DO NOT EXCEED A TOTAL OF 3 DOSES IN 15 MINUTES 11/20/16  Yes Troy Sine, MD  NITROSTAT 0.4 MG SL tablet DISSOLVE ONE TABLET UNDER THE TONGUE EVERY 5 MINUTES AS NEEDED FOR CHEST PAIN.  DO NOT EXCEED A TOTAL OF 3 DOSES IN 15 MINUTES 11/20/16  Yes Troy Sine, MD  ramipril (ALTACE) 10 MG capsule TAKE ONE CAPSULE BY MOUTH ONCE DAILY 01/12/17  Yes Troy Sine, MD  RANEXA 1000 MG SR tablet TAKE ONE TABLET BY MOUTH TWICE DAILY 07/30/16  Yes Troy Sine, MD  rosuvastatin (CRESTOR) 20 MG tablet Take 1 tablet (20 mg total) by mouth every evening. 11/26/15  Yes Troy Sine, MD  traMADol (ULTRAM) 50 MG tablet Take 1 tablet (50 mg total) by mouth every 6 (six) hours as needed. 12/31/16  Yes Wendie Agreste, MD   Social History   Social History  . Marital status: Married    Spouse name: N/A  . Number of children: N/A  . Years of education: 64   Occupational History  . Retired    Social History Main Topics  . Smoking status: Never Smoker  . Smokeless tobacco: Never Used  . Alcohol use No  . Drug use: No  . Sexual activity: No   Other Topics Concern  . Not on file   Social History Narrative   Married.       Review of Systems  Constitutional: Negative for chills and fever.  Eyes: Negative for photophobia, pain and redness.  Respiratory: Negative for cough, choking and shortness of breath.   Gastrointestinal: Negative for nausea and vomiting.  Musculoskeletal: Negative for neck pain and neck stiffness.       Ankle pain  Neurological: Negative for syncope and speech difficulty.       Objective:   Physical Exam  Constitutional: He is oriented to person, place, and time. He appears  well-developed and well-nourished. No distress.  HENT:  Head: Normocephalic and atraumatic.  Cardiovascular: Normal rate, regular rhythm and normal heart sounds.  Exam reveals no gallop and no friction rub.   No murmur heard. Pulmonary/Chest: Effort normal and breath sounds normal. No respiratory distress. He has no wheezes. He has no rales.  Lungs are clear with breath sounds in all lung fields.   Musculoskeletal:  Left: Minimal discomfort into the lower tibia, medial malleolus, minimal discomfort of the lateral malleolus  Navicula and fifth metatarsal is non tender. Slight laxity with taler tilt as well  as anterior drawer. Negative squeeze, negative kleiger  Neurological: He is alert and oriented to person, place, and time.  Skin: Skin is warm and dry.  Psychiatric: He has a normal mood and affect. His behavior is normal.   Vitals:   01/19/17 1005  BP: 124/78  Pulse: 70  Resp: 17  Temp: 98 F (36.7 C)  TempSrc: Oral  SpO2: 98%  Weight: 198 lb (89.8 kg)  Height: 5' 7.5" (1.715 m)       Assessment & Plan:  Bradley Oconnor is a 76 y.o. male Sprain of anterior talofibular ligament of left ankle, subsequent encounter  - Improving. Further history obtained today may have had old injury from [employer] service.   - Continue home exercises, brace if needed but out of brace as much as possible for exercises.   - Consider formal physical therapy if instability symptoms are not improving in the next 4-6 weeks   Chest wall pain  -Contusion from fall as above. Improving, x-ray reassuring last visit. Lungs clear today.  -symptomatic care discussed, Ultram if needed, RTC precautions if worsening.  No orders of the defined types were placed in this encounter.  Patient Instructions    Continue exercises for ankle sprain. Use the brace only as needed for activities that may be higher risk for turning your ankle. If instability symptoms are not improving with home exercises in the next 4-6  weeks, would recommend physical therapy or possible evaluation with orthopedist.  Chest wall pain should continue to improve, make sure you are taking deep breaths throughout the day, tramadol if needed for episodic pain.  Return to the clinic or go to the nearest emergency room if any of your symptoms worsen or new symptoms occur.    IF you received an x-ray today, you will receive an invoice from [facility]. Please contact [facility] at [phone] with questions or concerns regarding your invoice.   IF you received labwork today, you will receive an invoice from [place]. Please contact [facility] at [phone] with questions or concerns regarding your invoice.   Our billing staff will not be able to assist you with questions regarding bills from these companies.  You will be contacted with the lab results as soon as they are available. The fastest way to get your results is to activate your My Chart account. Instructions are located on the last page of this paperwork. If you have not heard from [place] regarding the results in 2 weeks, please contact this office.      I personally performed the services described in this documentation, which was scribed in my presence. The recorded information has been reviewed and considered for accuracy and completeness, addended by me as needed, and agree with information above.  Signed,   Merri Ray, MD [facility] at [place].  01/19/17 10:48 AM

## 2017-01-25 ENCOUNTER — Other Ambulatory Visit: Payer: Self-pay | Admitting: Cardiovascular Disease

## 2017-01-25 NOTE — Telephone Encounter (Signed)
REFILL 

## 2017-01-29 ENCOUNTER — Other Ambulatory Visit: Payer: Self-pay | Admitting: Cardiovascular Disease

## 2017-02-10 DIAGNOSIS — H35051 Retinal neovascularization, unspecified, right eye: Secondary | ICD-10-CM | POA: Diagnosis not present

## 2017-02-10 DIAGNOSIS — H43811 Vitreous degeneration, right eye: Secondary | ICD-10-CM | POA: Diagnosis not present

## 2017-02-16 ENCOUNTER — Ambulatory Visit (INDEPENDENT_AMBULATORY_CARE_PROVIDER_SITE_OTHER): Payer: PPO

## 2017-02-16 VITALS — BP 102/70 | HR 60 | Temp 97.5°F | Ht 68.0 in | Wt 201.0 lb

## 2017-02-16 DIAGNOSIS — Z0000 Encounter for general adult medical examination without abnormal findings: Secondary | ICD-10-CM

## 2017-02-16 NOTE — Patient Instructions (Addendum)
[name] , Thank you for taking time to come for your Medicare Wellness Visit. I appreciate your ongoing commitment to your health goals. Please review the following plan we discussed and let me know if I can assist you in the future.   Screening recommendations/referrals: Colonoscopy: You will let us know when you want to have this repeated. Recommended yearly ophthalmology/optometry visit for glaucoma screening and checkup Recommended yearly dental visit for hygiene and checkup  Vaccinations: Influenza vaccine: up to date  Pneumococcal vaccine: declined Tdap vaccine: declined due to insurance Shingles vaccine: Check with your pharmacy about receiving this vaccine    Advanced directives: Advance directive discussed with you today. Even though you declined this today please call our office should you change your mind and we can give you the proper paperwork for you to fill out.  Conditions/risks identified: Try to start back exercising on a regular basis.   Next appointment: 03/11/17 @ 4:40 pm with Dr. Carlota Raspberry  Preventive Care 65 Years and Older, Male Preventive care refers to lifestyle choices and visits with your health care provider that can promote health and wellness. What does preventive care include?  A yearly physical exam. This is also called an annual well check.  Dental exams once or twice a year.  Routine eye exams. Ask your health care provider how often you should have your eyes checked.  Personal lifestyle choices, including:  Daily care of your teeth and gums.  Regular physical activity.  Eating a healthy diet.  Avoiding tobacco and drug use.  Limiting alcohol use.  Practicing safe sex.  Taking low doses of aspirin every day.  Taking vitamin and mineral supplements as recommended by your health care provider. What happens during an annual well check? The services and screenings done by your health care provider during your annual well check will  depend on your age, overall health, lifestyle risk factors, and family history of disease. Counseling  Your health care provider may ask you questions about your:  Alcohol use.  Tobacco use.  Drug use.  Emotional well-being.  Home and relationship well-being.  Sexual activity.  Eating habits.  History of falls.  Memory and ability to understand (cognition).  Work and work Statistician. Screening  You may have the following tests or measurements:  Height, weight, and BMI.  Blood pressure.  Lipid and cholesterol levels. These may be checked every 5 years, or more frequently if you are over 82 years old.  Skin check.  Lung cancer screening. You may have this screening every year starting at age 89 if you have a 30-pack-year history of smoking and currently smoke or have quit within the past 15 years.  Fecal occult blood test (FOBT) of the stool. You may have this test every year starting at age 42.  Flexible sigmoidoscopy or colonoscopy. You may have a sigmoidoscopy every 5 years or a colonoscopy every 10 years starting at age 43.  Prostate cancer screening. Recommendations will vary depending on your family history and other risks.  Hepatitis C blood test.  Hepatitis B blood test.  Sexually transmitted disease (STD) testing.  Diabetes screening. This is done by checking your blood sugar (glucose) after you have not eaten for a while (fasting). You may have this done every 1-3 years.  Abdominal aortic aneurysm (AAA) screening. You may need this if you are a current or former smoker.  Osteoporosis. You may be screened starting at age 62 if you are at high risk. Talk with your health  care provider about your test results, treatment options, and if necessary, the need for more tests. Vaccines  Your health care provider may recommend certain vaccines, such as:  Influenza vaccine. This is recommended every year.  Tetanus, diphtheria, and acellular pertussis (Tdap,  Td) vaccine. You may need a Td booster every 10 years.  Zoster vaccine. You may need this after age 77.  Pneumococcal 13-valent conjugate (PCV13) vaccine. One dose is recommended after age 63.  Pneumococcal polysaccharide (PPSV23) vaccine. One dose is recommended after age 46. Talk to your health care provider about which screenings and vaccines you need and how often you need them. This information is not intended to replace advice given to you by your health care provider. Make sure you discuss any questions you have with your health care provider. Document Released: 04/12/2015 Document Revised: 12/04/2015 Document Reviewed: 01/15/2015 Elsevier Interactive Patient Education  2017 [place] Prevention in the Home Falls can cause injuries. They can happen to people of all ages. There are many things you can do to make your home safe and to help prevent falls. What can I do on the outside of my home?  Regularly fix the edges of walkways and driveways and fix any cracks.  Remove anything that might make you trip as you walk through a door, such as a raised step or threshold.  Trim any bushes or trees on the path to your home.  Use bright outdoor lighting.  Clear any walking paths of anything that might make someone trip, such as rocks or tools.  Regularly check to see if handrails are loose or broken. Make sure that both sides of any steps have handrails.  Any raised decks and porches should have guardrails on the edges.  Have any leaves, snow, or ice cleared regularly.  Use sand or salt on walking paths during winter.  Clean up any spills in your garage right away. This includes oil or grease spills. What can I do in the bathroom?  Use night lights.  Install grab bars by the toilet and in the tub and shower. Do not use towel bars as grab bars.  Use non-skid mats or decals in the tub or shower.  If you need to sit down in the shower, use a plastic, non-slip  stool.  Keep the floor dry. Clean up any water that spills on the floor as soon as it happens.  Remove soap buildup in the tub or shower regularly.  Attach bath mats securely with double-sided non-slip rug tape.  Do not have throw rugs and other things on the floor that can make you trip. What can I do in the bedroom?  Use night lights.  Make sure that you have a light by your bed that is easy to reach.  Do not use any sheets or blankets that are too big for your bed. They should not hang down onto the floor.  Have a firm chair that has side arms. You can use this for support while you get dressed.  Do not have throw rugs and other things on the floor that can make you trip. What can I do in the kitchen?  Clean up any spills right away.  Avoid walking on wet floors.  Keep items that you use a lot in easy-to-reach places.  If you need to reach something above you, use a strong step stool that has a grab bar.  Keep electrical cords out of the way.  Do not use floor  polish or wax that makes floors slippery. If you must use wax, use non-skid floor wax.  Do not have throw rugs and other things on the floor that can make you trip. What can I do with my stairs?  Do not leave any items on the stairs.  Make sure that there are handrails on both sides of the stairs and use them. Fix handrails that are broken or loose. Make sure that handrails are as long as the stairways.  Check any carpeting to make sure that it is firmly attached to the stairs. Fix any carpet that is loose or worn.  Avoid having throw rugs at the top or bottom of the stairs. If you do have throw rugs, attach them to the floor with carpet tape.  Make sure that you have a light switch at the top of the stairs and the bottom of the stairs. If you do not have them, ask someone to add them for you. What else can I do to help prevent falls?  Wear shoes that:  Do not have high heels.  Have rubber bottoms.  Are  comfortable and fit you well.  Are closed at the toe. Do not wear sandals.  If you use a stepladder:  Make sure that it is fully opened. Do not climb a closed stepladder.  Make sure that both sides of the stepladder are locked into place.  Ask someone to hold it for you, if possible.  Clearly mark and make sure that you can see:  Any grab bars or handrails.  First and last steps.  Where the edge of each step is.  Use tools that help you move around (mobility aids) if they are needed. These include:  Canes.  Walkers.  Scooters.  Crutches.  Turn on the lights when you go into a dark area. Replace any light bulbs as soon as they burn out.  Set up your furniture so you have a clear path. Avoid moving your furniture around.  If any of your floors are uneven, fix them.  If there are any pets around you, be aware of where they are.  Review your medicines with your doctor. Some medicines can make you feel dizzy. This can increase your chance of falling. Ask your doctor what other things that you can do to help prevent falls. This information is not intended to replace advice given to you by your health care provider. Make sure you discuss any questions you have with your health care provider. Document Released: 01/10/2009 Document Revised: 08/22/2015 Document Reviewed: 04/20/2014 Elsevier Interactive Patient Education  2017 [employer].

## 2017-02-16 NOTE — Progress Notes (Signed)
Subjective:   Bradley Oconnor is a 76 y.o. male who presents for Medicare Annual/Subsequent preventive examination.  Review of Systems:  N/A Cardiac Risk Factors include: advanced age (>75men, >29 women);dyslipidemia;hypertension;male gender;obesity (BMI >30kg/m2)      Objective:    Vitals: BP 102/70   Pulse 60   Temp (!) 97.5 F (36.4 C) (Oral)   Ht 5\' 8"  (1.727 m)   Wt 201 lb (91.2 kg)   SpO2 95%   BMI 30.56 kg/m   Body mass index is 30.56 kg/m.  Tobacco Social History   Tobacco Use  Smoking Status Never Smoker  Smokeless Tobacco Never Used     Counseling given: Not Answered   Past Medical History:  Diagnosis Date  . Blood transfusion without reported diagnosis   . CAD (coronary artery disease)   . GERD (gastroesophageal reflux disease)   . Heart attack ([place])   . Hyperlipidemia   . Hypertension 03/07/12   ECHO-WNL     08/12/11 Lexiscan MyoviewNo significant ischemia demonstrated Low risk scan There is a moderate sized dense scar in the LCX territoy unchanged from the prior study.. Post- stress EF is 40%.  . Thyroid disease    Past Surgical History:  Procedure Laterality Date  . APPENDECTOMY    . CERVICAL SPINE SURGERY     titanium plate in the back of neck  . CORONARY ARTERY BYPASS GRAFT    . HERNIA REPAIR    . SMALL INTESTINE SURGERY    . THYROID SURGERY     1/2 thyroid removed on right side   Family History  Problem Relation Age of Onset  . Heart disease Mother   . Cancer Mother        breast, stomach  . Heart disease Father   . Hyperlipidemia Father   . Heart disease Brother   . Diabetes Brother   . Hyperlipidemia Brother   . Heart disease Paternal Grandfather   . Healthy Brother    Social History   Substance and Sexual Activity  Sexual Activity No    Outpatient Encounter Medications as of 02/16/2017  Medication Sig  . amLODipine (NORVASC) 5 MG tablet TAKE 1 TABLET BY MOUTH ONCE DAILY  . aspirin EC 81 MG tablet Take 1 tablet (81 mg  total) by mouth daily.  . fish oil-omega-3 fatty acids 1000 MG capsule Take 1 g by mouth daily.   . Garlic 0539 MG CAPS Take 1 capsule by mouth daily.   . hydrochlorothiazide (MICROZIDE) 12.5 MG capsule Take 1 capsule (12.5 mg total) by mouth daily as needed (fluid and edema).  . isosorbide mononitrate (IMDUR) 120 MG 24 hr tablet TAKE ONE TABLET BY MOUTH ONCE DAILY  . lansoprazole (PREVACID) 15 MG capsule Take 15 mg by mouth daily.  Marland Kitchen levothyroxine (SYNTHROID, LEVOTHROID) 100 MCG tablet TAKE ONE TABLET BY MOUTH IN THE MORNING BEFORE BREAKFAST  . metoprolol succinate (TOPROL-XL) 100 MG 24 hr tablet Take 1 and 1/4 tablet by mouth once daily.  . nitroGLYCERIN (NITROSTAT) 0.4 MG SL tablet DISSOLVE ONE TABLET UNDER THE TONGUE EVERY 5 MINUTES AS NEEDED FOR CHEST PAIN.  DO NOT EXCEED A TOTAL OF 3 DOSES IN 15 MINUTES  . NITROSTAT 0.4 MG SL tablet DISSOLVE ONE TABLET UNDER THE TONGUE EVERY 5 MINUTES AS NEEDED FOR CHEST PAIN.  DO NOT EXCEED A TOTAL OF 3 DOSES IN 15 MINUTES  . ramipril (ALTACE) 10 MG capsule TAKE ONE CAPSULE BY MOUTH ONCE DAILY  . RANEXA 1000 MG SR tablet  TAKE ONE TABLET BY MOUTH TWICE DAILY  . rosuvastatin (CRESTOR) 20 MG tablet TAKE ONE TABLET BY MOUTH ONCE DAILY IN THE EVENING  . [DISCONTINUED] traMADol (ULTRAM) 50 MG tablet Take 1 tablet (50 mg total) by mouth every 6 (six) hours as needed.   No facility-administered encounter medications on file as of 02/16/2017.     Activities of Daily Living In your present state of health, do you have any difficulty performing the following activities: 02/16/2017 12/22/2016  Hearing? Y N  Comment Patient has issues with soft tone voices -  Vision? Y N  Comment Patient states that he has floaters -  Difficulty concentrating or making decisions? Y N  Comment Patient has some memory issues -  Walking or climbing stairs? N N  Dressing or bathing? N N  Doing errands, shopping? N N  Preparing Food and eating ? N -  Using the Toilet? N -  In the  past six months, have you accidently leaked urine? N -  Do you have problems with loss of bowel control? N -  Managing your Medications? N -  Managing your Finances? N -  Housekeeping or managing your Housekeeping? N -  Some recent data might be hidden    Patient Care Team: Wendie Agreste, MD as PCP - General (Family Medicine) Troy Sine, MD as Consulting Physician (Cardiology)   Assessment:     Exercise Activities and Dietary recommendations Current Exercise Habits: The patient does not participate in regular exercise at present(Patient is active at home), Exercise limited by: None identified  Goals    . Exercise 3x per week (30 min per time)     Patient states that he wants to try to start back exercising.       Fall Risk Fall Risk  02/16/2017 12/31/2016 12/22/2016 04/17/2016 04/17/2014  Falls in the past year? Yes No Yes No No  Number falls in past yr: 1 - 1 - -  Injury with Fall? Yes - Yes - -  Risk Factor Category  High Fall Risk - High Fall Risk - -  Risk for fall due to : Other (Comment) - - - -  Risk for fall due to: Comment Patient fell down 22 steps head first - - - -  Follow up Falls prevention discussed - Falls evaluation completed - -   Depression Screen PHQ 2/9 Scores 02/16/2017 12/31/2016 12/22/2016 04/17/2016  PHQ - 2 Score 0 0 0 0    Cognitive Function     6CIT Screen 02/16/2017  What Year? 0 points  What month? 0 points  What time? 0 points  Count back from 20 0 points  Months in reverse 0 points  Repeat phrase 0 points  Total Score 0    Immunization History  Administered Date(s) Administered  . Influenza Split 12/09/2011  . Influenza,inj,Quad PF,6+ Mos 02/01/2013, 02/01/2014, 01/17/2015, 12/17/2015, 12/31/2016  . Pneumococcal Polysaccharide-23 04/17/2014   Screening Tests Health Maintenance  Topic Date Due  . PNA vac Low Risk Adult (2 of 2 - PCV13) 03/11/2017 (Originally 04/18/2015)  . TETANUS/TDAP  02/16/2018 (Originally 08/11/1959)  .  INFLUENZA VACCINE  Completed      Plan:   I have personally reviewed and noted the following in the patient's chart:   . Medical and social history . Use of alcohol, tobacco or illicit drugs  . Current medications and supplements . Functional ability and status . Nutritional status . Physical activity . Advanced directives . List of other physicians .  Hospitalizations, surgeries, and ER visits in previous 12 months . Vitals . Screenings to include cognitive, depression, and falls . Referrals and appointments  In addition, I have reviewed and discussed with patient certain preventive protocols, quality metrics, and best practice recommendations. A written personalized care plan for preventive services as well as general preventive health recommendations were provided to patient.    Patient will receive his pneumonia vaccine at his next office visit.  1. Encounter for Medicare annual wellness exam   Doyal, Saric, LPN  21/22/4825

## 2017-02-23 DIAGNOSIS — H5213 Myopia, bilateral: Secondary | ICD-10-CM | POA: Diagnosis not present

## 2017-02-23 DIAGNOSIS — H52203 Unspecified astigmatism, bilateral: Secondary | ICD-10-CM | POA: Diagnosis not present

## 2017-02-23 DIAGNOSIS — H2513 Age-related nuclear cataract, bilateral: Secondary | ICD-10-CM | POA: Diagnosis not present

## 2017-03-02 ENCOUNTER — Ambulatory Visit: Payer: PPO | Admitting: Family Medicine

## 2017-03-09 ENCOUNTER — Ambulatory Visit: Payer: PPO | Admitting: Family Medicine

## 2017-03-11 ENCOUNTER — Encounter: Payer: Self-pay | Admitting: Family Medicine

## 2017-03-11 ENCOUNTER — Other Ambulatory Visit: Payer: Self-pay

## 2017-03-11 ENCOUNTER — Ambulatory Visit (INDEPENDENT_AMBULATORY_CARE_PROVIDER_SITE_OTHER): Payer: PPO | Admitting: Family Medicine

## 2017-03-11 VITALS — BP 112/64 | HR 62 | Temp 97.5°F | Resp 18 | Ht 68.0 in | Wt 203.8 lb

## 2017-03-11 DIAGNOSIS — M25511 Pain in right shoulder: Secondary | ICD-10-CM | POA: Diagnosis not present

## 2017-03-11 DIAGNOSIS — M25572 Pain in left ankle and joints of left foot: Secondary | ICD-10-CM

## 2017-03-11 NOTE — Patient Instructions (Addendum)
  Continue home exercises for ankle. If any new pain or more instability, return for recheck.   Shoulder pain appears to be bursitis or possible flare of wear and tear with recent shoveling. Tylenol and Biofreeze ok to continue, but return if pain not improving in next week or two.   Return to the clinic or go to the nearest emergency room if any of your symptoms worsen or new symptoms occur.   Shoulder Pain Many things can cause shoulder pain, including:  An injury to the area.  Overuse of the shoulder.  Arthritis.  The source of the pain can be:  Inflammation.  An injury to the shoulder joint.  An injury to a tendon, ligament, or bone.  Follow these instructions at home: Take these actions to help with your pain:  Squeeze a soft ball or a foam pad as much as possible. This helps to keep the shoulder from swelling. It also helps to strengthen the arm.  Take over-the-counter and prescription medicines only as told by your health care provider.  If directed, apply ice to the area: ? Put ice in a plastic bag. ? Place a towel between your skin and the bag. ? Leave the ice on for 20 minutes, 2-3 times per day. Stop applying ice if it does not help with the pain.  If you were given a shoulder sling or immobilizer: ? Wear it as told. ? Remove it to shower or bathe. ? Move your arm as little as possible, but keep your hand moving to prevent swelling.  Contact a health care provider if:  Your pain gets worse.  Your pain is not relieved with medicines.  New pain develops in your arm, hand, or fingers. Get help right away if:  Your arm, hand, or fingers: ? Tingle. ? Become numb. ? Become swollen. ? Become painful. ? Turn white or blue. This information is not intended to replace advice given to you by your health care provider. Make sure you discuss any questions you have with your health care provider. Document Released: 12/24/2004 Document Revised: 11/10/2015 Document  Reviewed: 07/09/2014 Elsevier Interactive Patient Education  2017 [employer].    IF you received an x-ray today, you will receive an invoice from [facility]. Please contact [facility] at [phone] with questions or concerns regarding your invoice.   IF you received labwork today, you will receive an invoice from [facility]. Please contact [facility] at [phone] with questions or concerns regarding your invoice.   Our billing staff will not be able to assist you with questions regarding bills from these companies.  You will be contacted with the lab results as soon as they are available. The fastest way to get your results is to activate your My Chart account. Instructions are located on the last page of this paperwork. If you have not heard from [place] regarding the results in 2 weeks, please contact this office.

## 2017-03-11 NOTE — Progress Notes (Signed)
Subjective:  By signing my name below, I, Moises Blood, attest that this documentation has been prepared under the direction and in the presence of Merri Ray, MD. Electronically Signed: Moises Blood, [place]. 03/11/2017 , 5:04 PM .  Patient was seen in Room 10 .   Patient ID: Bradley Oconnor, male    DOB: December 02, 1940, 76 y.o.   MRN: [id number] Chief Complaint  Patient presents with  . Fall    follow up    HPI Bradley Oconnor is a 76 y.o. male  He has a history of HTN, CAD and hyperlipidemia; followed by his cardiologist, Dr. Claiborne Billings. Here for follow up of a fall that occurred on Sept 22nd. He had some persistent ankle pain after initial sprain. He also had a chest wall contusion that was improving at last visit on Oct 23rd. He was having some instability symptoms for the ankle; recommended continue home exercise program and brace only as needed.   Patient states his left ankle has been popping and crackling, but denies any more soreness or pain. He denies any instability, but still feels a little weak. He continued doing ROM exercises while watching TV at home, and exercises with resistant band about 1-2x a week. He denies taking any medications for it. He denies any left ankle swelling.   He also mentions having persistent right shoulder pain which flared up after shoveling snow yesterday. He's taken tylenol for this, and applying Biofreeze ointment.   Patient Active Problem List   Diagnosis Date Noted  . GERD (gastroesophageal reflux disease) 04/10/2013  . HTN (hypertension) 04/10/2013  . CAD (coronary artery disease) of artery bypass graft 10/04/2012  . Hypothyroidism 10/04/2012  . Hyperlipidemia with target LDL less than 70 10/04/2012  . Kidney stones 01/27/2012  . Gallstones-symptomatic 01/27/2012  . Hx of appendectomy-history of ruptured requiring ileocecectomy (~1994) 01/27/2012   Past Medical History:  Diagnosis Date  . Blood transfusion without reported diagnosis     . CAD (coronary artery disease)   . GERD (gastroesophageal reflux disease)   . Heart attack ([place])   . Hyperlipidemia   . Hypertension 03/07/12   ECHO-WNL     08/12/11 Lexiscan MyoviewNo significant ischemia demonstrated Low risk scan There is a moderate sized dense scar in the LCX territoy unchanged from the prior study.. Post- stress EF is 40%.  . Thyroid disease    Past Surgical History:  Procedure Laterality Date  . APPENDECTOMY    . CERVICAL SPINE SURGERY     titanium plate in the back of neck  . CORONARY ARTERY BYPASS GRAFT    . HERNIA REPAIR    . SMALL INTESTINE SURGERY    . 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 Procardia [Nifedipine] Other (See Comments)    Lowers bp   . Phenergan [Promethazine Hcl] Nausea And Vomiting   Prior to Admission medications   Medication Sig Start Date End Date Taking? Authorizing Provider  amLODipine (NORVASC) 5 MG tablet TAKE 1 TABLET BY MOUTH ONCE DAILY 01/29/17   Troy Sine, MD  aspirin EC 81 MG tablet Take 1 tablet (81 mg total) by mouth daily. 04/06/16   Troy Sine, MD  fish oil-omega-3 fatty acids 1000 MG capsule Take 1 g by mouth daily.     [provider]  Garlic 4696 MG CAPS Take 1 capsule by mouth daily.     [provider]  hydrochlorothiazide (MICROZIDE)  12.5 MG capsule Take 1 capsule (12.5 mg total) by mouth daily as needed (fluid and edema). 05/29/16   Troy Sine, MD  isosorbide mononitrate (IMDUR) 120 MG 24 hr tablet TAKE ONE TABLET BY MOUTH ONCE DAILY 01/12/17   Troy Sine, MD  lansoprazole (PREVACID) 15 MG capsule Take 15 mg by mouth daily.    [provider]  levothyroxine (SYNTHROID, LEVOTHROID) 100 MCG tablet TAKE ONE TABLET BY MOUTH IN THE MORNING BEFORE BREAKFAST 01/12/17   Troy Sine, MD  metoprolol succinate (TOPROL-XL) 100 MG 24 hr tablet Take 1 and 1/4 tablet by mouth once daily. 05/29/16   Troy Sine, MD   nitroGLYCERIN (NITROSTAT) 0.4 MG SL tablet DISSOLVE ONE TABLET UNDER THE TONGUE EVERY 5 MINUTES AS NEEDED FOR CHEST PAIN.  DO NOT EXCEED A TOTAL OF 3 DOSES IN 15 MINUTES 11/20/16   Troy Sine, MD  NITROSTAT 0.4 MG SL tablet DISSOLVE ONE TABLET UNDER THE TONGUE EVERY 5 MINUTES AS NEEDED FOR CHEST PAIN.  DO NOT EXCEED A TOTAL OF 3 DOSES IN 15 MINUTES 11/20/16   Troy Sine, MD  ramipril (ALTACE) 10 MG capsule TAKE ONE CAPSULE BY MOUTH ONCE DAILY 01/12/17   Troy Sine, MD  RANEXA 1000 MG SR tablet TAKE ONE TABLET BY MOUTH TWICE DAILY 07/30/16   Troy Sine, MD  rosuvastatin (CRESTOR) 20 MG tablet TAKE ONE TABLET BY MOUTH ONCE DAILY IN THE EVENING 01/25/17   Troy Sine, MD   Social History   Socioeconomic History  . Marital status: Married    Spouse name: Not on file  . Number of children: Not on file  . Years of education: 88  . Highest education level: Some college, no degree  Social Needs  . Financial resource strain: Not hard at all  . Food insecurity - worry: Never true  . Food insecurity - inability: Never true  . Transportation needs - medical: No  . Transportation needs - non-medical: No  Occupational History  . Occupation: Retired  Tobacco Use  . Smoking status: Never Smoker  . Smokeless tobacco: Never Used  Substance and Sexual Activity  . Alcohol use: No    Alcohol/week: 0.0 oz  . Drug use: No  . Sexual activity: No  Other Topics Concern  . Not on file  Social History Narrative   Married.    Review of Systems  Constitutional: Negative for fatigue and unexpected weight change.  Eyes: Negative for visual disturbance.  Respiratory: Negative for cough, chest tightness and shortness of breath.   Cardiovascular: Negative for chest pain, palpitations and leg swelling.  Gastrointestinal: Negative for abdominal pain and blood in stool.  Musculoskeletal: Positive for arthralgias. Negative for joint swelling.  Neurological: Negative for dizziness,  light-headedness and headaches.       Objective:   Physical Exam  Constitutional: He is oriented to person, place, and time. He appears well-developed and well-nourished.  HENT:  Head: Normocephalic and atraumatic.  Eyes: EOM are normal. Pupils are equal, round, and reactive to light.  Neck: No JVD present. Carotid bruit is not present.  Cardiovascular: Normal rate, regular rhythm and normal heart sounds.  No murmur heard. Pulmonary/Chest: Effort normal and breath sounds normal. He has no rales.  Musculoskeletal: He exhibits no edema.  Left ankle: minimal laxity with drawer test, no bony tenderness, minimal talar tilt, negative kleiger, negative squeeze, no bony tenderness of the foot Right shoulder: positive neer, positive hawkins, equal ROM compared to the  left, AC/[place] clavicle non tender, full RTC strength.   Neurological: He is alert and oriented to person, place, and time.  Skin: Skin is warm and dry.  Psychiatric: He has a normal mood and affect.  Vitals reviewed.   Vitals:   03/11/17 1615  BP: 112/64  Pulse: 62  Resp: 18  Temp: (!) 97.5 F (36.4 C)  TempSrc: Oral  SpO2: 96%  Weight: 203 lb 12.8 oz (92.4 kg)  Height: 5\' 8"  (1.727 m)      Assessment & Plan:   Bradley Oconnor is a 76 y.o. male Pain in joint of right shoulder  - Noted after shoveling snow. Appears to be impingement/bursitis on exam, but possible rotator cuff tendinosis versus flare of arthritis. No weakness, deferred imaging at this time. Symptomatic care with Tylenol or Biofreeze okay to continue, with RTC precautions if persistent symptoms.  Left ankle pain, unspecified chronicity  - prior sprain with possible laxity with remote injury as well. Pain free on exam. Functionally doing well. Continue home exercise program and rtc precautions.   No orders of the defined types were placed in this encounter.  Patient Instructions    Continue home exercises for ankle. If any new pain or more  instability, return for recheck.   Shoulder pain appears to be bursitis or possible flare of wear and tear with recent shoveling. Tylenol and Biofreeze ok to continue, but return if pain not improving in next week or two.   Return to the clinic or go to the nearest emergency room if any of your symptoms worsen or new symptoms occur.   Shoulder Pain Many things can cause shoulder pain, including:  An injury to the area.  Overuse of the shoulder.  Arthritis.  The source of the pain can be:  Inflammation.  An injury to the shoulder joint.  An injury to a tendon, ligament, or bone.  Follow these instructions at home: Take these actions to help with your pain:  Squeeze a soft ball or a foam pad as much as possible. This helps to keep the shoulder from swelling. It also helps to strengthen the arm.  Take over-the-counter and prescription medicines only as told by your health care provider.  If directed, apply ice to the area: ? Put ice in a plastic bag. ? Place a towel between your skin and the bag. ? Leave the ice on for 20 minutes, 2-3 times per day. Stop applying ice if it does not help with the pain.  If you were given a shoulder sling or immobilizer: ? Wear it as told. ? Remove it to shower or bathe. ? Move your arm as little as possible, but keep your hand moving to prevent swelling.  Contact a health care provider if:  Your pain gets worse.  Your pain is not relieved with medicines.  New pain develops in your arm, hand, or fingers. Get help right away if:  Your arm, hand, or fingers: ? Tingle. ? Become numb. ? Become swollen. ? Become painful. ? Turn white or blue. This information is not intended to replace advice given to you by your health care provider. Make sure you discuss any questions you have with your health care provider. Document Released: 12/24/2004 Document Revised: 11/10/2015 Document Reviewed: 07/09/2014 Elsevier Interactive Patient Education   2017 [employer].    IF you received an x-ray today, you will receive an invoice from [facility]. Please contact [facility] at [phone] with questions or concerns regarding your  invoice.   IF you received labwork today, you will receive an invoice from [place]. Please contact [facility] at [phone] with questions or concerns regarding your invoice.   Our billing staff will not be able to assist you with questions regarding bills from these companies.  You will be contacted with the lab results as soon as they are available. The fastest way to get your results is to activate your My Chart account. Instructions are located on the last page of this paperwork. If you have not heard from [place] regarding the results in 2 weeks, please contact this office.      I personally performed the services described in this documentation, which was scribed in my presence. The recorded information has been reviewed and considered for accuracy and completeness, addended by me as needed, and agree with information above.  Signed,   Merri Ray, MD [facility].  03/11/17 5:17 PM

## 2017-04-15 ENCOUNTER — Other Ambulatory Visit: Payer: Self-pay | Admitting: Cardiovascular Disease

## 2017-04-15 NOTE — Telephone Encounter (Signed)
REFILL 

## 2017-05-10 ENCOUNTER — Other Ambulatory Visit: Payer: Self-pay | Admitting: Cardiovascular Disease

## 2017-05-10 NOTE — Telephone Encounter (Signed)
REFILL 

## 2017-05-18 ENCOUNTER — Other Ambulatory Visit: Payer: Self-pay

## 2017-05-26 MED ORDER — LEVOTHYROXINE SODIUM 100 MCG PO TABS: ORAL_TABLET | ORAL | 1 refills | Status: DC

## 2017-06-18 ENCOUNTER — Ambulatory Visit (INDEPENDENT_AMBULATORY_CARE_PROVIDER_SITE_OTHER): Payer: PPO | Admitting: Cardiovascular Disease

## 2017-06-18 ENCOUNTER — Encounter: Payer: Self-pay | Admitting: Cardiovascular Disease

## 2017-06-18 VITALS — BP 118/62 | HR 50 | Ht 67.5 in | Wt 201.8 lb

## 2017-06-18 DIAGNOSIS — I5189 Other ill-defined heart diseases: Secondary | ICD-10-CM

## 2017-06-18 DIAGNOSIS — I519 Heart disease, unspecified: Secondary | ICD-10-CM | POA: Diagnosis not present

## 2017-06-18 DIAGNOSIS — E038 Other specified hypothyroidism: Secondary | ICD-10-CM | POA: Diagnosis not present

## 2017-06-18 DIAGNOSIS — E785 Hyperlipidemia, unspecified: Secondary | ICD-10-CM

## 2017-06-18 DIAGNOSIS — I25709 Atherosclerosis of coronary artery bypass graft(s), unspecified, with unspecified angina pectoris: Secondary | ICD-10-CM

## 2017-06-18 DIAGNOSIS — I10 Essential (primary) hypertension: Secondary | ICD-10-CM

## 2017-06-18 DIAGNOSIS — I25810 Atherosclerosis of coronary artery bypass graft(s) without angina pectoris: Secondary | ICD-10-CM | POA: Diagnosis not present

## 2017-06-18 NOTE — Progress Notes (Signed)
Patient ID: Bradley Oconnor, male   DOB: [birth year], 77 y.o.   MRN: [id number]     HPI: Bradley Oconnor, is a 77 y.o. male who presents to the office for a 7 month follow-up cardiology evaluation.  [name] has established CAD dating back to 1992 when he suffered an inferior wall myocardial infarction and underwent PTCA of a totally occluded RCA. In 1993 due to progressive CAD, he underwent CABG surgery with a LIMA to his LAD, vein graft sequentially to a diagonal and marginal vein graft to his PDA branch of his right carotid artery. In September 2002 a stent was placed the PLA of his RCA.  In June 2011, he suffered a non-ST segment elevation MI which was felt to be due to RCA graft occlusion which supplied the PDA and PLA vessel. The PDA was extensively collateralized now via the left circumflex territory. His native RCA was totally occluded at the mid level. His LIMA graft is widely patent as was the sequential graft to the diagonal marginal vessel. He has done well particularly with the addition of Ranexa titrated up to 1000 twice a day added to his medical regimen. He believes the Ranexa has made a huge difference in his anginal symptomatology. He is unaware of palpitations he denies presyncope or syncope.  Additional problems include GERD, hyperlipidemia, hypertension.  In 2015, he experienced 3 short-lived episodes of some mild chest pain.  He feels that his CAD is stable.  His wife unfortunately has suffered multiple small strokes and she is now fairly incapacitated and he spends much of his time caring for her.  As result, he has not been as active as he had in the past.  Over the past year, he denies recurrent anginal symptoms. He denies dizziness.  He he has been on ramipril 10 mg, amlodipine 5 mg, HCTZ 12.5 mg as needed for edema, in addition to Toprol-XL 125 mg and isosorbide 120 mg.  He denies bleeding on aspirin.  He has been taking 325 mg aspirin.  He also has been on ranolazine 1000 g  twice a day.  He is was recently switched by his insurance company to generic Crestor 20 mg daily, which he has been on for 30 days and also takes over-the-counter fish oil. He has hypothyroidism on Synthroid replacement and Prevacid for GERD.   He underwent an echo Doppler study on 07/07/2016.  This showed normal systolic function with an EF of 55-60%.  There was grade 1 diastolic dysfunction.  He had mild aortic sclerosis with trivial AR, mild to moderate mitral regurgitation, and had biatrial enlargement, left greater than right.  Peak PA pressure was 25 mm  I last saw [name] at which time he remained stable.  He denies any recurrent chest pain.  He believes the addition of Ranexa to his medical regimen.  Several years ago was again change her and made dramatic improvement in his symptomatology.  He has not been able to exercise as regularly and often times does not sleep as well in caring for his disabled wife. She had suffered multiple strokes in addition has dementia. He is unaware of PND, orthopnea.  He denies palpitations.  He presents for reevaluation.  Past Medical History:  Diagnosis Date  . Blood transfusion without reported diagnosis   . CAD (coronary artery disease)   . GERD (gastroesophageal reflux disease)   . Heart attack ([place])   . Hyperlipidemia   . Hypertension 03/07/12   ECHO-WNL  08/12/11 Lexiscan MyoviewNo significant ischemia demonstrated Low risk scan There is a moderate sized dense scar in the LCX territoy unchanged from the prior study.. Post- stress EF is 40%.  . Thyroid disease     Past Surgical History:  Procedure Laterality Date  . APPENDECTOMY    . CERVICAL SPINE SURGERY     titanium plate in the back of neck  . CORONARY ARTERY BYPASS GRAFT    . HERNIA REPAIR    . SMALL INTESTINE SURGERY    . THYROID SURGERY     1/2 thyroid removed on right side    Allergies  Allergen Reactions  . Procardia [Nifedipine] Other (See Comments)    Lowers bp    . Phenergan [Promethazine Hcl] Nausea And Vomiting    Current Outpatient Medications  Medication Sig Dispense Refill  . amLODipine (NORVASC) 5 MG tablet TAKE 1 TABLET BY MOUTH ONCE DAILY 30 tablet 6  . aspirin EC 81 MG tablet Take 1 tablet (81 mg total) by mouth daily. 90 tablet 3  . fish oil-omega-3 fatty acids 1000 MG capsule Take 1 g by mouth daily.     . Garlic 1025 MG CAPS Take 1 capsule by mouth daily.     . hydrochlorothiazide (MICROZIDE) 12.5 MG capsule Take 1 capsule (12.5 mg total) by mouth daily as needed (fluid and edema). 90 capsule 3  . isosorbide mononitrate (IMDUR) 120 MG 24 hr tablet Take 1 tablet (120 mg total) by mouth daily. KEEP OV. 90 tablet 0  . lansoprazole (PREVACID) 15 MG capsule Take 15 mg by mouth daily.    Marland Kitchen levothyroxine (SYNTHROID, LEVOTHROID) 100 MCG tablet TAKE 1 TABLET BY MOUTH IN THE MORNING BEFORE BREAKFAST 90 tablet 1  . metoprolol succinate (TOPROL-XL) 100 MG 24 hr tablet Take 1 and 1/4 tablet by mouth once daily. 120 tablet 3  . nitroGLYCERIN (NITROSTAT) 0.4 MG SL tablet DISSOLVE ONE TABLET UNDER THE TONGUE EVERY 5 MINUTES AS NEEDED FOR CHEST PAIN.  DO NOT EXCEED A TOTAL OF 3 DOSES IN 15 MINUTES 25 tablet 6  . NITROSTAT 0.4 MG SL tablet DISSOLVE ONE TABLET UNDER THE TONGUE EVERY 5 MINUTES AS NEEDED FOR CHEST PAIN.  DO NOT EXCEED A TOTAL OF 3 DOSES IN 15 MINUTES 25 tablet 3  . ramipril (ALTACE) 10 MG capsule Take 1 capsule (10 mg total) by mouth daily. KEEP OV. 90 capsule 0  . RANEXA 1000 MG SR tablet TAKE ONE TABLET BY MOUTH TWICE DAILY 180 tablet 3  . rosuvastatin (CRESTOR) 20 MG tablet TAKE ONE TABLET BY MOUTH ONCE DAILY IN THE EVENING 90 tablet 1   No current facility-administered medications for this visit.     Socially he is married. He does try to do some exercise. He remains relatively active with yard work. Is no tobacco or alcohol use.  ROS General: Negative; No fevers, chills, or night sweats;  HEENT: Uses a hearing aid; No changes in  vision , sinus congestion, difficulty swallowing Pulmonary: Negative; No cough, wheezing, shortness of breath, hemoptysis Cardiovascular: See HPI GI: Negative; No nausea, vomiting, diarrhea, or abdominal pain GU: Negative; No dysuria, hematuria, or difficulty voiding Musculoskeletal: Negative; no myalgias, joint pain, or weakness Hematologic/Oncology: Negative; no easy bruising, bleeding Endocrine: Negative; no heat/cold intolerance; no diabetes Neuro: Negative; no changes in balance, headaches Skin: Negative; No rashes or skin lesions Psychiatric: Negative; No behavioral problems, depression Sleep: Negative; No snoring, daytime sleepiness, hypersomnolence, bruxism, restless legs, hypnogognic hallucinations, no cataplexy Other comprehensive 14 point system review  is negative.   PE BP 118/62   Pulse (!) 50   Ht 5' 7.5" (1.715 m)   Wt 201 lb 12.8 oz (91.5 kg)   BMI 31.14 kg/m    Repeat blood pressure by me was 116/62  Wt Readings from Last 3 Encounters:  06/18/17 201 lb 12.8 oz (91.5 kg)  03/11/17 203 lb 12.8 oz (92.4 kg)  02/16/17 201 lb (91.2 kg)   General: Alert, oriented, no distress.  Skin: normal turgor, no rashes, warm and dry HEENT: Normocephalic, atraumatic. Pupils equal round and reactive to light; sclera anicteric; extraocular muscles intact;  Nose without nasal septal hypertrophy Mouth/Parynx benign; Mallinpatti scale 3 Neck: No JVD, no carotid bruits; normal carotid upstroke Lungs: clear to ausculatation and percussion; no wheezing or rales Chest wall: without tenderness to palpitation Heart: PMI not displaced, RRR, s1 s2 normal, 1-8/4 systolic murmur, no diastolic murmur, no rubs, gallops, thrills, or heaves Abdomen: soft, nontender; no hepatosplenomehaly, BS+; abdominal aorta nontender and not dilated by palpation. Back: no CVA tenderness Pulses 2+ Musculoskeletal: full range of motion, normal strength, no joint deformities Extremities: no clubbing cyanosis or  edema, Homan's sign negative  Neurologic: grossly nonfocal; Cranial nerves grossly wnl Psychologic: Normal mood and affect    ECG (independently read by me): sinus bradycardia 50 bpm.  LVH with mild ST changes.  Old inferior infarct.  Prolonged first-degree AV block with a PR interval 2063 seconds.  QTc interval 477 ms.  August 2018 ECG (independently read by me): Sinus bradycardia 56 bpm.  Borderline LVH.  Q waves in lead 3 and aVF.  QTc interval 467 ms  January 2018 ECG (independently read by me): Sinus bradycardia 55 bpm.  Q wave in lead 3 and aVF.  Nonspecific T changes.  January 2017 ECG (independently read by me): Normal sinus rhythm at 61 bpm.  LVH by voltage.  Old inferior MI with inferior Q waves.    January 2016 ECG (independently read by me): Sinus bradycardia 56 bpm.  Old inferior MI with Q waves and early transition, suggesting posterior wall involvement.  Nonspecific ST changes.  January 2015 ECG: Sinus rhythm at 58 beats per minute. Nonspecific T changes. Evidence for old inferior MI with Q waves in leads 3 and aVF.  LABS: BMP Latest Ref Rng & Units 04/29/2016 04/17/2015 04/17/2014  Glucose 65 - 99 mg/dL 114(H) 111(H) 93  BUN 7 - 25 mg/dL 14 16 29(H)  Creatinine 0.70 - 1.18 mg/dL 0.83 1.00 1.27  Sodium 135 - 146 mmol/L 143 140 138  Potassium 3.5 - 5.3 mmol/L 4.2 4.0 4.3  Chloride 98 - 110 mmol/L 106 105 102  CO2 20 - 31 mmol/L _0 Calcium 8.6 - 10.3 mg/dL 9.0 8.7 9.1   Hepatic Function Latest Ref Rng & Units 04/29/2016 04/17/2015 04/17/2014  Total Protein 6.1 - 8.1 g/dL 6.8 6.3 7.7  Albumin 3.6 - 5.1 g/dL 3.5(L) 3.7 3.9  AST 10 - 35 U/L _1 ALT 9 - 46 U/L 34 20 18  Alk Phosphatase 40 - 115 U/L 53 47 56  Total Bilirubin 0.2 - 1.2 mg/dL 0.9 0.8 1.1   CBC Latest Ref Rng & Units 04/29/2016 04/17/2015 04/17/2014  WBC 3.8 - 10.8 K/uL 7.3 6.4 7.9  Hemoglobin 13.2 - 17.1 g/dL 13.4 13.5 14.4  Hematocrit 38.5 - 50.0 % 40.7 40.0 42.9  Platelets 140 - 400 K/uL 196  147(L) 194   Lab Results  Component Value Date   MCV 90.8 04/29/2016  MCV 92.2 04/17/2015   MCV 89.7 04/17/2014   No results found for: HGBA1C  Lab Results  Component Value Date   TSH 0.50 04/29/2016   Lipid Panel     Component Value Date/Time   CHOL 141 04/29/2016 1213   TRIG 252 (H) 04/29/2016 1213   HDL 34 (L) 04/29/2016 1213   CHOLHDL 4.1 04/29/2016 1213   VLDL 50 (H) 04/29/2016 1213   LDLCALC 57 04/29/2016 1213    RADIOLOGY: No results found.  IMPRESSION:  No diagnosis found.   ASSESSMENT AND PLAN: Ms. Bradley Oconnor is a 77 year old Caucasian male who suffered an  inferior wall myocardial infarction 27 years ago in 1992 with initial PTCA. Marland Kitchen  He underwent CABG surgery in 1993 due to progressive CAD.Marland Kitchen He has documented RCA graft occlusion with good left-to-right collaterals. Since initiating Ranexa, this is made of remarkable difference in his symptomatology  Presently, he denies any anginal symptoms on amlodipine 5 mg, isosorbide 120 mg, Toprol-XL 125 mg daily in addition to Ranexa 1000 mg twice a day.  His blood pressure today is well controlled and he also continues to take ramipril 10 mg daily in addition to the above medication.  Although he remains active, he is not exercising as he had done in the past and is caring for his disabled wife also has dementia.    I reviewed his echo Doppler study again from last year.  He is grade 1 diastolic dysfunction.  His EF had improved to 55-60%.  Compared to his prior echo of 2013 at 45-50%. He has a history of hyperlipidemia.  He continues to be on rosuvastatin 20 mg in addition to omega-3 fatty acids and states he is taking at least 4 g a day.  Laboratory in January 2018 showed a total cholesterol 141, and LDL 57.  However, at that time triglycerides were elevated for which his fish oil was increased.  He is fasting today.  A complete set of fasting laboratory will be obtained.  He also has a history of hypothyroidism.  He continues to  take levothyroxine 100 g daily.  His GERD is controlled with Prevacid.  He is followed by his primary care physician every 2-3 months.  I will contact him regarding blood work if adjustments to his medications are necessary.  As long as he remains stable, I will see him in one year for reevaluation.  Time spent: 25 minutes Troy Sine, MD, [facility]  06/18/2017 12:13 PM

## 2017-06-18 NOTE — Patient Instructions (Addendum)
MEDICATION INSTRUCTIONS  NO CHANGES    Your physician wants you to follow-up in 12 months with DR Claiborne Billings You will receive a reminder letter in the mail two months in advance. If you don't receive a letter, please call our office to schedule the follow-up appointment.   If you need a refill on your cardiac medications before your next appointment, please call your pharmacy.

## 2017-06-19 LAB — CBC
HEMATOCRIT: 41.7 % (ref 37.5–51.0)
Hemoglobin: 13.6 g/dL (ref 13.0–17.7)
MCH: 30.3 pg (ref 26.6–33.0)
MCHC: 32.6 g/dL (ref 31.5–35.7)
MCV: 93 fL (ref 79–97)
Platelets: 156 10*3/uL (ref 150–379)
RBC: 4.49 x10E6/uL (ref 4.14–5.80)
RDW: 14.2 % (ref 12.3–15.4)
WBC: 7.3 10*3/uL (ref 3.4–10.8)

## 2017-06-19 LAB — COMPREHENSIVE METABOLIC PANEL
ALK PHOS: 54 IU/L (ref 39–117)
ALT: 22 IU/L (ref 0–44)
AST: 21 IU/L (ref 0–40)
Albumin/Globulin Ratio: 1.2 (ref 1.2–2.2)
Albumin: 4.1 g/dL (ref 3.5–4.8)
BUN/Creatinine Ratio: 16 (ref 10–24)
BUN: 17 mg/dL (ref 8–27)
Bilirubin Total: 0.9 mg/dL (ref 0.0–1.2)
CO2: 25 mmol/L (ref 20–29)
CREATININE: 1.06 mg/dL (ref 0.76–1.27)
Calcium: 9.2 mg/dL (ref 8.6–10.2)
Chloride: 102 mmol/L (ref 96–106)
GFR calc Af Amer: 78 mL/min/{1.73_m2} (ref >59)
GFR calc non Af Amer: 67 mL/min/{1.73_m2} (ref >59)
Globulin, Total: 3.3 g/dL (ref 1.5–4.5)
Glucose: 103 mg/dL — ABNORMAL HIGH (ref 65–99)
Potassium: 4 mmol/L (ref 3.5–5.2)
Sodium: 143 mmol/L (ref 134–144)
Total Protein: 7.4 g/dL (ref 6.0–8.5)

## 2017-06-19 LAB — LIPID PANEL
Chol/HDL Ratio: 3.2 ratio (ref 0.0–5.0)
Cholesterol, Total: 141 mg/dL (ref 100–199)
HDL: 44 mg/dL (ref >39)
LDL Calculated: 70 mg/dL (ref 0–99)
Triglycerides: 137 mg/dL (ref 0–149)
VLDL CHOLESTEROL CAL: 27 mg/dL (ref 5–40)

## 2017-06-19 LAB — TSH: TSH: 0.895 u[IU]/mL (ref 0.450–4.500)

## 2017-07-05 ENCOUNTER — Other Ambulatory Visit: Payer: Self-pay | Admitting: Cardiovascular Disease

## 2017-07-06 NOTE — Telephone Encounter (Signed)
REFILL 

## 2017-08-03 ENCOUNTER — Encounter: Payer: Self-pay | Admitting: *Deleted

## 2017-08-05 ENCOUNTER — Other Ambulatory Visit: Payer: Self-pay | Admitting: Cardiovascular Disease

## 2017-10-06 ENCOUNTER — Other Ambulatory Visit: Payer: Self-pay | Admitting: Cardiovascular Disease

## 2017-12-10 ENCOUNTER — Other Ambulatory Visit: Payer: Self-pay | Admitting: Cardiovascular Disease

## 2018-01-05 ENCOUNTER — Other Ambulatory Visit: Payer: Self-pay | Admitting: Cardiovascular Disease

## 2018-02-10 ENCOUNTER — Ambulatory Visit (INDEPENDENT_AMBULATORY_CARE_PROVIDER_SITE_OTHER): Payer: PPO | Admitting: Family Medicine

## 2018-02-10 ENCOUNTER — Ambulatory Visit (INDEPENDENT_AMBULATORY_CARE_PROVIDER_SITE_OTHER): Payer: PPO

## 2018-02-10 ENCOUNTER — Other Ambulatory Visit: Payer: Self-pay

## 2018-02-10 ENCOUNTER — Encounter: Payer: Self-pay | Admitting: Family Medicine

## 2018-02-10 ENCOUNTER — Ambulatory Visit: Payer: PPO | Admitting: Family Medicine

## 2018-02-10 VITALS — BP 119/54 | HR 55 | Temp 97.8°F | Ht 68.0 in | Wt 197.4 lb

## 2018-02-10 DIAGNOSIS — M25562 Pain in left knee: Secondary | ICD-10-CM | POA: Diagnosis not present

## 2018-02-10 DIAGNOSIS — M898X1 Other specified disorders of bone, shoulder: Secondary | ICD-10-CM | POA: Diagnosis not present

## 2018-02-10 DIAGNOSIS — M542 Cervicalgia: Secondary | ICD-10-CM

## 2018-02-10 DIAGNOSIS — M25522 Pain in left elbow: Secondary | ICD-10-CM

## 2018-02-10 DIAGNOSIS — Z23 Encounter for immunization: Secondary | ICD-10-CM | POA: Diagnosis not present

## 2018-02-10 DIAGNOSIS — M25512 Pain in left shoulder: Secondary | ICD-10-CM | POA: Diagnosis not present

## 2018-02-10 NOTE — Progress Notes (Signed)
Subjective:  By signing my name below, I, Bradley Oconnor, attest that this documentation has been prepared under the direction and in the presence of Bradley Ray, MD. Electronically Signed: [name], [place]. 02/10/2018 , 2:20 PM .  Patient was seen in Room 8 .   Patient ID: Bradley Oconnor, male    DOB: [birth year], 78 y.o.   MRN: [id number] Chief Complaint  Patient presents with  . Joint Swelling    elbow and shoulder pain left side   HPI Bradley Oconnor is a 77 y.o. male  Patient was seen after a fall in Sept 2018. He did have a negative xray in left and right shoulders, and left forearm. Patient states he was cleaning the back of his pick up truck about 6 weeks ago, and stepped onto the edge, and the hinge broke off. He fell, landed on his left elbow. The area has been staying sore.   He also mentions having some left neck pain to his left shoulder, which has been present prior to most recent fall from his pick up truck (present for several months now). He denies any known injuries to the area. He has a history of titanium plate placed in 4315. He hasn't seen ortho since that time. He has taken 2 tylenol for relief. He has noticed some fullness over the area about 6 weeks ago after the fall from his pick up.   He also complains of left knee pain present for the past 6-8 months. He mentions that he had difficulty with it this morning where it felt like it wanted to give out, worsening in the past few weeks. He has an OTC elastic band for it. He's taken tylenol for this. He notes his knee catches him occasionally.   Patient Active Problem List   Diagnosis Date Noted  . GERD (gastroesophageal reflux disease) 04/10/2013  . HTN (hypertension) 04/10/2013  . CAD (coronary artery disease) of artery bypass graft 10/04/2012  . Hypothyroidism 10/04/2012  . Hyperlipidemia with target LDL less than 70 10/04/2012  . Kidney stones 01/27/2012  . Gallstones-symptomatic 01/27/2012  . Hx of  appendectomy-history of ruptured requiring ileocecectomy (~1994) 01/27/2012   Past Medical History:  Diagnosis Date  . Oconnor transfusion without reported diagnosis   . CAD (coronary artery disease)   . GERD (gastroesophageal reflux disease)   . Heart attack ([place])   . Hyperlipidemia   . Hypertension 03/07/12   ECHO-WNL     08/12/11 Lexiscan MyoviewNo significant ischemia demonstrated Low risk scan There is a moderate sized dense scar in the LCX territoy unchanged from the prior study.. Post- stress EF is 40%.  . Thyroid disease    Past Surgical History:  Procedure Laterality Date  . APPENDECTOMY    . CERVICAL SPINE SURGERY     titanium plate in the back of neck  . CORONARY ARTERY BYPASS GRAFT    . HERNIA REPAIR    . SMALL INTESTINE SURGERY    . 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 Procardia [Nifedipine] Other (See Comments)    Lowers bp   . Phenergan [Promethazine Hcl] Nausea And Vomiting   Prior to Admission medications   Medication Sig Start Date End Date Taking? Authorizing Provider  amLODipine (NORVASC) 5 MG tablet TAKE 1 TABLET BY MOUTH ONCE DAILY 10/06/17  Yes Troy Sine, MD  aspirin EC 81 MG tablet Take 1 tablet (81 mg total) by  mouth daily. 04/06/16  Yes Troy Sine, MD  fish oil-omega-3 fatty acids 1000 MG capsule Take 1 g by mouth daily.    Yes [provider]  Garlic 5462 MG CAPS Take 1 capsule by mouth daily.    Yes [provider]  hydrochlorothiazide (MICROZIDE) 12.5 MG capsule Take 1 capsule (12.5 mg total) by mouth daily as needed (fluid and edema). 05/29/16  Yes Troy Sine, MD  isosorbide mononitrate (IMDUR) 120 MG 24 hr tablet TAKE 1 TABLET BY MOUTH ONCE DAILY 07/06/17  Yes Troy Sine, MD  lansoprazole (PREVACID) 15 MG capsule Take 15 mg by mouth daily.   Yes [provider]  levothyroxine (SYNTHROID, LEVOTHROID) 100 MCG tablet TAKE 1 TABLET BY MOUTH IN THE MORNING  BEFORE BREAKFAST 05/26/17  Yes Troy Sine, MD  levothyroxine (SYNTHROID, LEVOTHROID) 100 MCG tablet TAKE 1 TABLET BY MOUTH IN THE MORNING 12/10/17  Yes Troy Sine, MD  metoprolol succinate (TOPROL-XL) 100 MG 24 hr tablet TAKE 1 AND 1/4 TABLETY BY MOUTH ONCE A DAY 07/06/17  Yes Troy Sine, MD  nitroGLYCERIN (NITROSTAT) 0.4 MG SL tablet DISSOLVE ONE TABLET UNDER THE TONGUE EVERY 5 MINUTES AS NEEDED FOR CHEST PAIN.  DO NOT EXCEED A TOTAL OF 3 DOSES IN 15 MINUTES 01/05/18  Yes Troy Sine, MD  NITROSTAT 0.4 MG SL tablet DISSOLVE ONE TABLET UNDER THE TONGUE EVERY 5 MINUTES AS NEEDED FOR CHEST PAIN.  DO NOT EXCEED A TOTAL OF 3 DOSES IN 15 MINUTES 11/20/16  Yes Troy Sine, MD  ramipril (ALTACE) 10 MG capsule TAKE 1 CAPSULE BY MOUTH ONCE DAILY 07/06/17  Yes Troy Sine, MD  ranolazine (RANEXA) 1000 MG SR tablet TAKE 1 TABLET BY MOUTH TWICE DAILY 12/10/17  Yes Troy Sine, MD  rosuvastatin (CRESTOR) 20 MG tablet TAKE 1 TABLET BY MOUTH ONCE DAILY IN THE EVENING 08/05/17  Yes Troy Sine, MD   Social History   Socioeconomic History  . Marital status: Married    Spouse name: Not on file  . Number of children: Not on file  . Years of education: 46  . Highest education level: Some college, no degree  Occupational History  . Occupation: Retired  Scientific laboratory technician  . Financial resource strain: Not hard at all  . Food insecurity:    Worry: Never true    Inability: Never true  . Transportation needs:    Medical: No    Non-medical: No  Tobacco Use  . Smoking status: Never Smoker  . Smokeless tobacco: Never Used  Substance and Sexual Activity  . Alcohol use: No    Alcohol/week: 0.0 standard drinks  . Drug use: No  . Sexual activity: Never  Lifestyle  . Physical activity:    Days per week: 0 days    Minutes per session: 0 min  . Stress: Rather much  Relationships  . Social connections:    Talks on phone: More than three times a week    Gets together: More than three times a  week    Attends religious service: 1 to 4 times per year    Active member of club or organization: No    Attends meetings of clubs or organizations: Never    Relationship status: Married  . Intimate partner violence:    Fear of current or ex partner: No    Emotionally abused: No    Physically abused: No    Forced sexual activity: No  Other Topics Concern  .  Not on file  Social History Narrative   Married.    Review of Systems  Constitutional: Negative for fatigue and unexpected weight change.  Eyes: Negative for visual disturbance.  Respiratory: Negative for cough, chest tightness and shortness of breath.   Cardiovascular: Negative for chest pain, palpitations and leg swelling.  Gastrointestinal: Negative for abdominal pain and Oconnor in stool.  Musculoskeletal: Positive for arthralgias. Negative for joint swelling.  Skin: Negative for wound.  Neurological: Negative for dizziness, weakness, light-headedness and headaches.       Objective:   Physical Exam  Constitutional: He is oriented to person, place, and time. He appears well-developed and well-nourished. No distress.  HENT:  Head: Normocephalic and atraumatic.  Eyes: Pupils are equal, round, and reactive to light. EOM are normal.  Neck: Neck supple.  Cardiovascular: Normal rate.  Pulmonary/Chest: Effort normal. No respiratory distress.  Musculoskeletal: Normal range of motion.  Left elbow: full ROM, skin intact, mobile small area distal to the olecranon, some tenderness at the tip of the olecranon, epicondyles non tender as well as radial head non tender, pain free ROM C-spine: minimal extension with discomfort, slight tenderness to the mid neck, slight tenderness and spasm of left paraspinals into left upper trapezius, [place]/AC non tender, mid clavicle slight tenderness without overlying erythema or bruising Left shoulder: full rotator cuff strength, full ROM, no tenderness of upper arm or shoulder, some fullness superior to  left clavicle Left knee: full ROM, skin intact, no effusion, full flexion and extension, no ecchymosis, slight tenderness of patellar tendon, joint lines appear non tender, negative mcmurray  Neurological: He is alert and oriented to person, place, and time.  Skin: Skin is warm and dry.  Psychiatric: He has a normal mood and affect. His behavior is normal.  Nursing note and vitals reviewed.   Vitals:   02/10/18 1356  BP: (!) 119/54  Pulse: (!) 55  Temp: 97.8 F (36.6 C)  TempSrc: Oral  SpO2: 96%  Weight: 197 lb 6.4 oz (89.5 kg)  Height: 5\' 8"  (1.727 m)   Dg Cervical Spine Complete  Result Date: 02/10/2018 CLINICAL DATA:  77 y/o M; left knee pain on and off for 6 months with episodic instability. Fall 6 weeks ago with left collarbone pain, elbow pain, ongoing left neck pain, and upper trapezius pain. EXAM: CERVICAL SPINE - COMPLETE 4+ VIEW COMPARISON:  12/31/2016 chest radiograph. FINDINGS: C1-C7 are visible on the lateral view. Normal cervical lordosis without listhesis. C5-C7 ACDF plate and screws are intact without apparent hardware related complication. Mild loss of the C4-5 intervertebral disc space. Nuchal ligament calcification noted. Odontoid process is intact. Normal anterior C1-2 articulation. No loss of vertebral body height or fracture. No prevertebral soft tissue thickening. IMPRESSION: No acute fracture or dislocation. C5-C7 ACDF without apparent hardware related complication. Electronically Signed   By: Kristine Garbe M.D.   On: 02/10/2018 15:17   Dg Clavicle Left  Result Date: 02/10/2018 CLINICAL DATA:  LEFT clavicle pain following fall 6 weeks ago. Initial encounter. EXAM: LEFT CLAVICLE - 2+ VIEWS COMPARISON:  None. FINDINGS: No acute fracture identified. Mild degenerative changes the [facility] joint noted. Fusion hardware of the LOWER cervical spine identified. IMPRESSION: No acute abnormality. Electronically Signed   By: Margarette Canada M.D.   On: 02/10/2018 15:18   Dg  Elbow Complete Left (3+view)  Result Date: 02/10/2018 CLINICAL DATA:  Fall 6 weeks ago with elbow pain. EXAM: LEFT ELBOW - COMPLETE 3+ VIEW COMPARISON:  12/22/2016 left forearm radiographs FINDINGS: No  fracture, dislocation, or elbow joint effusion is identified. There is at most minimal degenerative spurring at the elbow joint. The soft tissues are unremarkable. IMPRESSION: No acute osseous abnormality identified. Electronically Signed   By: Logan Bores M.D.   On: 02/10/2018 15:15   Dg Knee Complete 4 Views Left  Result Date: 02/10/2018 CLINICAL DATA:  Left knee pain off-and-on for 6 months, episodic instability, fall 6 weeks ago with left collarbone pain, elbow pain, ongoing left neck and upper trapezius pain EXAM: LEFT KNEE - COMPLETE 4+ VIEW COMPARISON:  None. FINDINGS: No evidence of fracture, dislocation, or joint effusion. No evidence of arthropathy or other focal bone abnormality. Popliteal artery calcifications are present. IMPRESSION: No evidence for acute  abnormality. Electronically Signed   By: Nolon Nations M.D.   On: 02/10/2018 15:25       Assessment & Plan:   Bradley Oconnor is a 77 y.o. male Left elbow pain - Plan: DG ELBOW COMPLETE LEFT (3+VIEW), Ambulatory referral to Orthopedic Surgery  - suspected contusion. Good rom and strength. xr reassuring.  Will refer to ortho for other issues below, can eval there as well if still symptomatic.   Need for influenza vaccination - Plan: Flu vaccine HIGH DOSE PF (Fluzone High Dose)  Left knee pain, unspecified chronicity - Plan: DG Knee Complete 4 Views Left, Ambulatory referral to Orthopedic Surgery, Apply knee sleeve  - possible degenerative meniscal dz. Reassuring XR. Knee brace provided, refer to ortho.  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 No  acute findings on XR.   Refer to ortho. Tylenol for now for various arthralgias.   No orders of the defined types were placed in this encounter.  Patient Instructions   No signs of broken bones on your x-rays.  Hardware appears to be intact at the cervical spine, but with the persistent pain and pain into upper shoulder, I suspect that may be coming from your neck.  I will refer you to orthopedics.  Tylenol over-the-counter if needed.  Try the knee brace to see if that helps with discomfort, but I will also refer you to orthopedics to evaluate that area as well.  If any worsening symptoms please return here or emergency room if needed.    Contusion A contusion is a deep bruise. Contusions are the result of a blunt injury to tissues and muscle fibers under the skin. The injury causes bleeding under the skin. The skin overlying the contusion may turn blue, purple, or yellow. Minor injuries will give you a painless contusion, but more severe contusions may stay painful and swollen for a few weeks. What are the causes? This condition is usually caused by a blow, trauma, or direct force to an area of the body. What are the signs or symptoms? Symptoms of this condition include:  Swelling of the injured area.  Pain and tenderness in the injured area.  Discoloration. The area may have redness and then turn blue, purple, or yellow.  How is this diagnosed? This condition is diagnosed based on a physical exam and medical history. An X-Oconnor, CT scan, or MRI may be needed to determine if there are any associated injuries, such as broken bones (fractures). How is this treated? Specific treatment for this condition depends on what area of the body was injured. In general, the best treatment for  a contusion is resting, icing, applying pressure to (compression), and elevating the injured area. This is often called the RICE strategy. Over-the-counter anti-inflammatory medicines may also be recommended for pain  control. Follow these instructions at home:  Rest the injured area.  If directed, apply ice to the injured area: ? Put ice in a plastic bag. ? Place a towel between your skin and the bag. ? Leave the ice on for 20 minutes, 2-3 times per day.  If directed, apply light compression to the injured area using an elastic bandage. Make sure the bandage is not wrapped too tightly. Remove and reapply the bandage as directed by your health care provider.  If possible, raise (elevate) the injured area above the level of your heart while you are sitting or lying down.  Take over-the-counter and prescription medicines only as told by your health care provider. Contact a health care provider if:  Your symptoms do not improve after several days of treatment.  Your symptoms get worse.  You have difficulty moving the injured area. Get help right away if:  You have severe pain.  You have numbness in a hand or foot.  Your hand or foot turns pale or cold. This information is not intended to replace advice given to you by your health care provider. Make sure you discuss any questions you have with your health care provider. Document Released: 12/24/2004 Document Revised: 07/25/2015 Document Reviewed: 08/01/2014 Elsevier Interactive Patient Education  2018 [place].  Knee Pain, Adult Knee pain in adults is common. It can be caused by many things, including:  Arthritis.  A fluid-filled sac (cyst) or growth in your knee.  An infection in your knee.  An injury that will not heal.  Damage, swelling, or irritation of the tissues that support your knee.  Knee pain is usually not a sign of a serious problem. The pain may go away on its own with time and rest. If it does not, a health care provider may order tests to find the cause of the pain. These may include:  Imaging tests, such as an X-Oconnor, MRI, or ultrasound.  Joint aspiration. In this test, fluid is removed from the  knee.  Arthroscopy. In this test, a lighted tube is inserted into knee and an image is projected onto a TV screen.  A biopsy. In this test, a sample of tissue is removed from the body and studied under a microscope.  Follow these instructions at home: Pay attention to any changes in your symptoms. Take these actions to relieve your pain. Activity  Rest your knee.  Do not do things that cause pain or make pain worse.  Avoid high-impact activities or exercises, such as running, jumping rope, or doing jumping jacks. General instructions  Take over-the-counter and prescription medicines only as told by your health care provider.  Raise (elevate) your knee above the level of your heart when you are sitting or lying down.  Sleep with a pillow under your knee.  If directed, apply ice to the knee: ? Put ice in a plastic bag. ? Place a towel between your skin and the bag. ? Leave the ice on for 20 minutes, 2-3 times a day.  Ask your health care provider if you should wear an elastic knee support.  Lose weight if you are overweight. Extra weight can put pressure on your knee.  Do not use any products that contain nicotine or tobacco, such as cigarettes and e-cigarettes. Smoking may slow the healing  of any bone and joint problems that you may have. If you need help quitting, ask your health care provider. Contact a health care provider if:  Your knee pain continues, changes, or gets worse.  You have a fever along with knee pain.  Your knee buckles or locks up.  Your knee swells, and the swelling becomes worse. Get help right away if:  Your knee feels warm to the touch.  You cannot move your knee.  You have severe pain in your knee.  You have chest pain.  You have trouble breathing. Summary  Knee pain in adults is common. It can be caused by many things, including, arthritis, infection, cysts, or injury.  Knee pain is usually not a sign of a serious problem, but if it  does not go away, a health care provider may perform tests to know the cause of the pain.  Pay attention to any changes in your symptoms. Relieve your pain with rest, medicines, light activity, and use of ice.  Get help if your pain continues or becomes very severe, or if your knee buckles or locks up, or if you have chest pain or trouble breathing. This information is not intended to replace advice given to you by your health care provider. Make sure you discuss any questions you have with your health care provider. Document Released: 01/11/2007 Document Revised: 03/06/2016 Document Reviewed: 03/06/2016 Elsevier Interactive Patient Education  [employer].    If you have lab work done today you will be contacted with your lab results within the next 2 weeks.  If you have not heard from [place] then please contact us. The fastest way to get your results is to register for My Chart.   IF you received an x-Oconnor today, you will receive an invoice from [facility]. Please contact [facility] at [phone] with questions or concerns regarding your invoice.   IF you received labwork today, you will receive an invoice from [facility]. Please contact [facility] at [phone] with questions or concerns regarding your invoice.   Our billing staff will not be able to assist you with questions regarding bills from these companies.  You will be contacted with the lab results as soon as they are available. The fastest way to get your results is to activate your My Chart account. Instructions are located on the last page of this paperwork. If you have not heard from [place] regarding the results in 2 weeks, please contact this office.       I personally performed the services described in this documentation, which was scribed in my presence. The recorded information has been reviewed and considered for accuracy and completeness, addended by me as needed, and agree with information  above.  Signed,   Bradley Ray, MD [facility] [place].  02/14/18 10:36 PM

## 2018-02-10 NOTE — Patient Instructions (Addendum)
No signs of broken bones on your x-rays.  Hardware appears to be intact at the cervical spine, but with the persistent pain and pain into upper shoulder, I suspect that may be coming from your neck.  I will refer you to orthopedics.  Tylenol over-the-counter if needed.  Try the knee brace to see if that helps with discomfort, but I will also refer you to orthopedics to evaluate that area as well.  If any worsening symptoms please return here or emergency room if needed.    Contusion A contusion is a deep bruise. Contusions are the result of a blunt injury to tissues and muscle fibers under the skin. The injury causes bleeding under the skin. The skin overlying the contusion may turn blue, purple, or yellow. Minor injuries will give you a painless contusion, but more severe contusions may stay painful and swollen for a few weeks. What are the causes? This condition is usually caused by a blow, trauma, or direct force to an area of the body. What are the signs or symptoms? Symptoms of this condition include:  Swelling of the injured area.  Pain and tenderness in the injured area.  Discoloration. The area may have redness and then turn blue, purple, or yellow.  How is this diagnosed? This condition is diagnosed based on a physical exam and medical history. An X-ray, CT scan, or MRI may be needed to determine if there are any associated injuries, such as broken bones (fractures). How is this treated? Specific treatment for this condition depends on what area of the body was injured. In general, the best treatment for a contusion is resting, icing, applying pressure to (compression), and elevating the injured area. This is often called the RICE strategy. Over-the-counter anti-inflammatory medicines may also be recommended for pain control. Follow these instructions at home:  Rest the injured area.  If directed, apply ice to the injured area: ? Put ice in a plastic bag. ? Place a towel between  your skin and the bag. ? Leave the ice on for 20 minutes, 2-3 times per day.  If directed, apply light compression to the injured area using an elastic bandage. Make sure the bandage is not wrapped too tightly. Remove and reapply the bandage as directed by your health care provider.  If possible, raise (elevate) the injured area above the level of your heart while you are sitting or lying down.  Take over-the-counter and prescription medicines only as told by your health care provider. Contact a health care provider if:  Your symptoms do not improve after several days of treatment.  Your symptoms get worse.  You have difficulty moving the injured area. Get help right away if:  You have severe pain.  You have numbness in a hand or foot.  Your hand or foot turns pale or cold. This information is not intended to replace advice given to you by your health care provider. Make sure you discuss any questions you have with your health care provider. Document Released: 12/24/2004 Document Revised: 07/25/2015 Document Reviewed: 08/01/2014 Elsevier Interactive Patient Education  2018 [place].  Knee Pain, Adult Knee pain in adults is common. It can be caused by many things, including:  Arthritis.  A fluid-filled sac (cyst) or growth in your knee.  An infection in your knee.  An injury that will not heal.  Damage, swelling, or irritation of the tissues that support your knee.  Knee pain is usually not a sign of a serious problem. The pain  may go away on its own with time and rest. If it does not, a health care provider may order tests to find the cause of the pain. These may include:  Imaging tests, such as an X-ray, MRI, or ultrasound.  Joint aspiration. In this test, fluid is removed from the knee.  Arthroscopy. In this test, a lighted tube is inserted into knee and an image is projected onto a TV screen.  A biopsy. In this test, a sample of tissue is removed from the body  and studied under a microscope.  Follow these instructions at home: Pay attention to any changes in your symptoms. Take these actions to relieve your pain. Activity  Rest your knee.  Do not do things that cause pain or make pain worse.  Avoid high-impact activities or exercises, such as running, jumping rope, or doing jumping jacks. General instructions  Take over-the-counter and prescription medicines only as told by your health care provider.  Raise (elevate) your knee above the level of your heart when you are sitting or lying down.  Sleep with a pillow under your knee.  If directed, apply ice to the knee: ? Put ice in a plastic bag. ? Place a towel between your skin and the bag. ? Leave the ice on for 20 minutes, 2-3 times a day.  Ask your health care provider if you should wear an elastic knee support.  Lose weight if you are overweight. Extra weight can put pressure on your knee.  Do not use any products that contain nicotine or tobacco, such as cigarettes and e-cigarettes. Smoking may slow the healing of any bone and joint problems that you may have. If you need help quitting, ask your health care provider. Contact a health care provider if:  Your knee pain continues, changes, or gets worse.  You have a fever along with knee pain.  Your knee buckles or locks up.  Your knee swells, and the swelling becomes worse. Get help right away if:  Your knee feels warm to the touch.  You cannot move your knee.  You have severe pain in your knee.  You have chest pain.  You have trouble breathing. Summary  Knee pain in adults is common. It can be caused by many things, including, arthritis, infection, cysts, or injury.  Knee pain is usually not a sign of a serious problem, but if it does not go away, a health care provider may perform tests to know the cause of the pain.  Pay attention to any changes in your symptoms. Relieve your pain with rest, medicines, light  activity, and use of ice.  Get help if your pain continues or becomes very severe, or if your knee buckles or locks up, or if you have chest pain or trouble breathing. This information is not intended to replace advice given to you by your health care provider. Make sure you discuss any questions you have with your health care provider. Document Released: 01/11/2007 Document Revised: 03/06/2016 Document Reviewed: 03/06/2016 Elsevier Interactive Patient Education  [employer].    If you have lab work done today you will be contacted with your lab results within the next 2 weeks.  If you have not heard from [place] then please contact us. The fastest way to get your results is to register for My Chart.   IF you received an x-ray today, you will receive an invoice from [facility]. Please contact [facility] at [phone] with questions or concerns regarding your  invoice.   IF you received labwork today, you will receive an invoice from [place]. Please contact [facility] at [phone] with questions or concerns regarding your invoice.   Our billing staff will not be able to assist you with questions regarding bills from these companies.  You will be contacted with the lab results as soon as they are available. The fastest way to get your results is to activate your My Chart account. Instructions are located on the last page of this paperwork. If you have not heard from [place] regarding the results in 2 weeks, please contact this office.

## 2018-03-07 ENCOUNTER — Ambulatory Visit (INDEPENDENT_AMBULATORY_CARE_PROVIDER_SITE_OTHER): Payer: PPO | Admitting: Orthopaedic Surgery

## 2018-03-07 ENCOUNTER — Encounter (INDEPENDENT_AMBULATORY_CARE_PROVIDER_SITE_OTHER): Payer: Self-pay | Admitting: Orthopaedic Surgery

## 2018-03-07 VITALS — BP 115/71 | HR 57 | Ht 69.0 in | Wt 197.0 lb

## 2018-03-07 DIAGNOSIS — M25562 Pain in left knee: Secondary | ICD-10-CM

## 2018-03-07 DIAGNOSIS — G8929 Other chronic pain: Secondary | ICD-10-CM

## 2018-03-07 DIAGNOSIS — M25522 Pain in left elbow: Secondary | ICD-10-CM

## 2018-03-07 DIAGNOSIS — M542 Cervicalgia: Secondary | ICD-10-CM | POA: Diagnosis not present

## 2018-03-07 MED ORDER — METHYLPREDNISOLONE ACETATE 40 MG/ML IJ SUSP
80.0000 mg | INTRAMUSCULAR | Status: AC | PRN
  Administered 2018-03-07: 80 mg

## 2018-03-07 MED ORDER — LIDOCAINE HCL 1 % IJ SOLN
2.0000 mL | INTRAMUSCULAR | Status: AC | PRN
  Administered 2018-03-07: 2 mL

## 2018-03-07 MED ORDER — BUPIVACAINE HCL 0.5 % IJ SOLN
2.0000 mL | INTRAMUSCULAR | Status: AC | PRN
  Administered 2018-03-07: 2 mL via INTRA_ARTICULAR

## 2018-03-07 NOTE — [place] Notes (Signed)
Office Visit Note   Patient: Bradley Oconnor           Date of Birth: 07/06/1940           MRN: [id number] Visit Date: 03/07/2018              Requested by: Wendie Agreste, MD [address] [place], [place] [address] PCP: Wendie Agreste, MD   Assessment & Plan: Visit Diagnoses:  1. Neck pain   2. Chronic pain of left knee   3. Pain in left elbow     Plan: Bradley Oconnor is having an issue with the cervical spine, left elbow and left knee.  Approximately 3 months ago he fell down to stairs at a [employer] [facility] [employer] football game and injured his right knee.  Initial films were negative but he is been having trouble along the medial compartment since that time.  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 monitor his response over the next 3 to 4 weeks.    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  Will order an MRI scan with [place] technique.  Left elbow pain appears to be very early olecranon bursitis without any loss of motion or significant pain.  I did review his films on the PACS system and did not see any acute abnormality.  I did see some early arthritis on the lateral film but without evidence of an effusion. hopefully this will just resolve on its own  Follow-Up Instructions: Return if symptoms worsen or fail to improve.   Orders:  Orders Placed This Encounter  Procedures  . MR Cervical Spine w/o contrast   No orders of the defined types were placed in this encounter.     Procedures: Large Joint Inj: L knee on 03/07/2018 3:06 PM Indications: pain and diagnostic evaluation Details: 25 G 1.5 in needle, anteromedial approach  Arthrogram: No  Medications: 2 mL lidocaine 1 %; 2 mL bupivacaine 0.5 %; 80 mg methylPREDNISolone acetate 40 MG/ML Procedure, treatment alternatives, risks and benefits explained, specific risks discussed. Consent was given by the  patient. Patient was prepped and draped in the usual sterile fashion.       Clinical Data: No additional findings.   Subjective: Chief Complaint  Patient presents with  . Neck - Pain  . Left Elbow - Pain  . Left Knee - Pain  . Neck Pain    Fall 1 year ago, previous sx before fall 9 years ago. Constant ache, limited ROM. Difficulty rotating head back and forth. Radiating soreness and pain to bilateral shoulders.   . Elbow Pain    3 months ago, fell getting out of truck bed. Soreness comes and goes. No numbness. Most pain when leaning on elbow on chairarm. Sharp pain at olecranon process.  . Knee Pain    Fall 1 year ago. Most pain inferior patella. Wants to give way. Sore, aches, feels like going in and out of place. Tylenol.  History Bradley Oconnor 77 years old visited the office evaluation of 3 problems as listed above.  He had a cervical spine fusion years ago and notes he is been having trouble with his neck "ever since".  The fusion was performed anteriorly at C5-6 and C6-7.  A lot of his arm pain is resolved but still having trouble with his neck.  He tried to make an appointment with a surgeon  but notes that" it would take too long to see him". In addition it is had a problem with his elbow.  Some months ago he fell landing olecranon and has had some's "stinging pain ever since that point.  No numbness or tingling or loss of motion.  He had films of his elbow that were negative for any acute changes but a early arthritis  He fell down some steps at the [facility] football game and injured his left knee in September 2019.  Since that time he said some pain along the medial compartment with occasional sensation of "something giving way.  Rarely has any swelling.  No numbness or tingling.  Some pain with weightbearing.  Has tried over-the-counter medicine with some relief.  HPI  Review of Systems   Objective: Vital Signs: BP 115/71 (BP Location: Right Arm, Patient Position: Sitting, Cuff  Size: Normal)   Pulse (!) 57   Ht 5\' 9"  (1.753 m)   Wt 197 lb (89.4 kg)   BMI 29.09 kg/m   Physical Exam  Constitutional: He is oriented to person, place, and time. He appears well-developed and well-nourished.  HENT:  Mouth/Throat: Oropharynx is clear and moist.  Eyes: Pupils are equal, round, and reactive to light. EOM are normal.  Pulmonary/Chest: Effort normal.  Neurological: He is alert and oriented to person, place, and time.  Skin: Skin is warm and dry.  Psychiatric: He has a normal mood and affect. His behavior is normal.    Ortho Exam awake alert and oriented x3.  Comfortable sitting.  Walks without a limp.  Left knee with full extension flexion over 105 degrees.  No instability.  Diffuse mild medial joint pain.  No pain laterally.  No patella pain.  No distal edema.  Neurovascular exam intact. Left elbow with very minimal tenderness over the olecranon.  Triceps intact.  No obvious bursa formation.  Full pronation supination flexion extension without crepitation.  No obvious effusion.  Neurologically intact.  Has limited rotation of his cervical spine to the right and to the left and some pain with neck flexion extension which she notes is not significantly different since he is had his neck surgery but it is uncomfortable without motion.  There is no referred pain to either shoulder upper extremity with motion.  I did review the films of his neck and this PACS system and he has had a prior fusion at C5-6 and C6-7 anteriorly.  There is some narrowing of the joint space between C4 and C5 but there is no evidence of a listhesis  Specialty Comments:  No specialty comments available.  Imaging: No results found.   PMFS History: Patient Active Problem List   Diagnosis Date Noted  . GERD (gastroesophageal reflux disease) 04/10/2013  . HTN (hypertension) 04/10/2013  . CAD (coronary artery disease) of artery bypass graft 10/04/2012  . Hypothyroidism 10/04/2012  . Hyperlipidemia  with target LDL less than 70 10/04/2012  . Kidney stones 01/27/2012  . Gallstones-symptomatic 01/27/2012  . Hx of appendectomy-history of ruptured requiring ileocecectomy (~1994) 01/27/2012   Past Medical History:  Diagnosis Date  . Blood transfusion without reported diagnosis   . CAD (coronary artery disease)   . GERD (gastroesophageal reflux disease)   . Heart attack ([place])   . Hyperlipidemia   . Hypertension 03/07/12   ECHO-WNL     08/12/11 Lexiscan MyoviewNo significant ischemia demonstrated Low risk scan There is a moderate sized dense scar in the LCX territoy unchanged from the prior study.. Post-  stress EF is 40%.  . Thyroid disease     Family History  Problem Relation Age of Onset  . Heart disease Mother   . Cancer Mother        breast, stomach  . Heart disease Father   . Hyperlipidemia Father   . Heart disease Brother   . Diabetes Brother   . Hyperlipidemia Brother   . Heart disease Paternal Grandfather   . Healthy Brother     Past Surgical History:  Procedure Laterality Date  . APPENDECTOMY    . CERVICAL SPINE SURGERY     titanium plate in the back of neck  . CORONARY ARTERY BYPASS GRAFT    . HERNIA REPAIR    . SMALL INTESTINE SURGERY    . 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 Occupation: Retired  Tobacco Use  . Smoking status: Never Smoker  . Smokeless tobacco: Never Used  Substance and Sexual Activity  . Alcohol use: No    Alcohol/week: 0.0 standard drinks  . Drug use: No  . Sexual activity: Never     Garald Balding, MD   Note - This record has been created using [employer].  Chart creation errors have been sought, but may not always  have been located. Such creation errors do not reflect on  the standard of medical care.

## 2018-03-19 ENCOUNTER — Ambulatory Visit
Admission: RE | Admit: 2018-03-19 | Discharge: 2018-03-19 | Disposition: A | Discharge status: Home / Self Care | Payer: PPO | Source: Ambulatory Visit | Attending: Orthopaedic Surgery | Admitting: Orthopaedic Surgery

## 2018-03-19 DIAGNOSIS — M542 Cervicalgia: Secondary | ICD-10-CM

## 2018-03-19 DIAGNOSIS — M4802 Spinal stenosis, cervical region: Secondary | ICD-10-CM | POA: Diagnosis not present

## 2018-03-28 ENCOUNTER — Ambulatory Visit (INDEPENDENT_AMBULATORY_CARE_PROVIDER_SITE_OTHER): Payer: PPO | Admitting: Orthopaedic Surgery

## 2018-03-28 ENCOUNTER — Encounter (INDEPENDENT_AMBULATORY_CARE_PROVIDER_SITE_OTHER): Payer: Self-pay | Admitting: Orthopaedic Surgery

## 2018-03-28 VITALS — BP 123/66 | HR 58 | Ht 69.0 in | Wt 194.0 lb

## 2018-03-28 DIAGNOSIS — M542 Cervicalgia: Secondary | ICD-10-CM | POA: Insufficient documentation

## 2018-03-28 NOTE — [place] Notes (Signed)
Office Visit Note   Patient: Bradley Oconnor           Date of Birth: June 07, 1940           MRN: [id number] Visit Date: 03/28/2018              Requested by: Wendie Agreste, MD [address] [place], [place] [address] PCP: Wendie Agreste, MD   Assessment & Plan: Visit Diagnoses:  1. Cervicalgia     Plan: MRI scan demonstrates a solid fusion between C5 and C7.  There are endplate osteophytes and shallow broad-based disc herniation at C3-4 and C4-5.  The disc material is more prominent towards the left at C3-4 and towards the right at C4-5.  There is possible either C4 or C5 encroachment at both levels.  Neurologically appears to be intact.  Will order cervical epidural steroid injection and monitor response.  Follow-Up Instructions: Return in about 3 weeks (around 04/18/2018).   Orders:  No orders of the defined types were placed in this encounter.  No orders of the defined types were placed in this encounter.     Procedures: No procedures performed   Clinical Data: No additional findings.   Subjective: Chief Complaint  Patient presents with  . Results    MRI  No change in symptoms  HPI  Review of Systems   Objective: Vital Signs: BP 123/66   Pulse (!) 58   Ht 5\' 9"  (1.753 m)   Wt 194 lb (88 kg)   BMI 28.65 kg/m   Physical Exam Constitutional:      Appearance: He is well-developed.  Eyes:     Pupils: Pupils are equal, round, and reactive to light.  Pulmonary:     Effort: Pulmonary effort is normal.  Skin:    General: Skin is warm and dry.  Neurological:     Mental Status: He is alert and oriented to person, place, and time.  Psychiatric:        Behavior: Behavior normal.     Ortho Exam awake alert and oriented x3.  Comfortable sitting.  Some loss of cervical spine motion predominantly related to his prior cervical fusion from C5-C7.  Reflexes symmetrical bilaterally in both upper extremities.  Good strength in both upper extremities.  No  loss of feeling. Specialty Comments:  No specialty comments available.  Imaging: No results found.   PMFS History: Patient Active Problem List   Diagnosis Date Noted  . Cervicalgia 03/28/2018  . GERD (gastroesophageal reflux disease) 04/10/2013  . HTN (hypertension) 04/10/2013  . CAD (coronary artery disease) of artery bypass graft 10/04/2012  . Hypothyroidism 10/04/2012  . Hyperlipidemia with target LDL less than 70 10/04/2012  . Kidney stones 01/27/2012  . Gallstones-symptomatic 01/27/2012  . Hx of appendectomy-history of ruptured requiring ileocecectomy (~1994) 01/27/2012   Past Medical History:  Diagnosis Date  . Blood transfusion without reported diagnosis   . CAD (coronary artery disease)   . GERD (gastroesophageal reflux disease)   . Heart attack ([place])   . Hyperlipidemia   . Hypertension 03/07/12   ECHO-WNL     08/12/11 Lexiscan MyoviewNo significant ischemia demonstrated Low risk scan There is a moderate sized dense scar in the LCX territoy unchanged from the prior study.. Post- stress EF is 40%.  . Thyroid disease     Family History  Problem Relation Age of Onset  . Heart disease Mother   . Cancer Mother        breast, stomach  .  Heart disease Father   . Hyperlipidemia Father   . Heart disease Brother   . Diabetes Brother   . Hyperlipidemia Brother   . Heart disease Paternal Grandfather   . Healthy Brother     Past Surgical History:  Procedure Laterality Date  . APPENDECTOMY    . CERVICAL SPINE SURGERY     titanium plate in the back of neck  . CORONARY ARTERY BYPASS GRAFT    . HERNIA REPAIR    . SMALL INTESTINE SURGERY    . 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 Occupation: Retired  Tobacco Use  . Smoking status: Never Smoker  . Smokeless tobacco: Never Used  Substance and Sexual Activity  . Alcohol use: No    Alcohol/week: 0.0 standard drinks  . Drug use: No  . Sexual activity: Never      Garald Balding, MD   Note - This record has been created using [employer].  Chart creation errors have been sought, but may not always  have been located. Such creation errors do not reflect on  the standard of medical care.

## 2018-03-28 NOTE — [place] Notes (Signed)
MRI cervical spine results

## 2018-04-07 DIAGNOSIS — H2511 Age-related nuclear cataract, right eye: Secondary | ICD-10-CM | POA: Diagnosis not present

## 2018-04-07 DIAGNOSIS — H25811 Combined forms of age-related cataract, right eye: Secondary | ICD-10-CM | POA: Diagnosis not present

## 2018-04-13 ENCOUNTER — Ambulatory Visit (INDEPENDENT_AMBULATORY_CARE_PROVIDER_SITE_OTHER): Payer: PPO | Admitting: Physical Medicine and Rehabilitation

## 2018-04-13 ENCOUNTER — Ambulatory Visit (INDEPENDENT_AMBULATORY_CARE_PROVIDER_SITE_OTHER): Payer: Self-pay

## 2018-04-13 ENCOUNTER — Encounter (INDEPENDENT_AMBULATORY_CARE_PROVIDER_SITE_OTHER): Payer: Self-pay | Admitting: Physical Medicine and Rehabilitation

## 2018-04-13 VITALS — BP 122/67 | HR 52 | Temp 97.9°F

## 2018-04-13 DIAGNOSIS — M961 Postlaminectomy syndrome, not elsewhere classified: Secondary | ICD-10-CM

## 2018-04-13 DIAGNOSIS — M4802 Spinal stenosis, cervical region: Secondary | ICD-10-CM

## 2018-04-13 DIAGNOSIS — M5412 Radiculopathy, cervical region: Secondary | ICD-10-CM

## 2018-04-13 DIAGNOSIS — M5010 Cervical disc disorder with radiculopathy, unspecified cervical region: Secondary | ICD-10-CM

## 2018-04-13 MED ORDER — METHYLPREDNISOLONE ACETATE 80 MG/ML IJ SUSP
80.0000 mg | Freq: Once | INTRAMUSCULAR | Status: AC
  Administered 2018-04-13: 80 mg

## 2018-04-13 NOTE — [place] Notes (Signed)
  Numeric Pain Rating Scale and Functional Assessment Average Pain (5)   In the last MONTH (on 0-10 scale) has pain interfered with the following?  1. General activity like being  able to carry out your everyday physical activities such as walking, climbing stairs, carrying groceries, or moving a chair?  Rating(5)   +Driver, -BT, -Dye Allergies.  

## 2018-04-13 NOTE — Patient Instructions (Signed)

## 2018-04-14 NOTE — Progress Notes (Signed)
Bradley Oconnor - 78 y.o. male MRN [id number]  Date of birth: 09/22/1940  Office Visit Note: Visit Date: 04/13/2018 PCP: Wendie Agreste, MD Referred by: Wendie Agreste, MD  Subjective: Chief Complaint  Patient presents with  . Neck - Pain  . Right Shoulder - Pain  . Left Shoulder - Pain   HPI: Bradley Oconnor is a 78 y.o. male who comes in today At the request of Dr. Joni Fears for evaluation management of chronic worsening neck shoulder bilaterally arm pain.  Patient has a prior history of ACDF C5-7 by Dr. Hal Neer.  He reports neck pain that really started probably 20 years ago and felt like the surgery did help at that time.  He has pain in both shoulders and neck pain bilaterally usually more right than left but lately a little bit more left than right.  Does not really go past the upper lateral shoulders.  No numbness or tingling in the hands.  No radicular pain down to the hands or lower than the shoulder.  Does get shoulder blade pain as well.  He reports the last few years it has worsened and he reports severe pain at times and constant pain most of the time but it can flareup.  He rates his average pain is a 5 out of 10.  It has not been relieved with medication including pain medication and Tylenol.  He really uses Tylenol at this point.  Sleeping can make the pain worse.  He has had no fevers chills or night sweats.  No prior history of cancer.  No night pain specifically.  No unexplained weight loss.  He does not note any feeling of weakness in the hands but he does feel like weakness in the shoulders.  Dr. Durward Fortes is followed him from a orthopedic standpoint and he is failed conservative care.  MRI was obtained and this is reviewed below.  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  Patient has not had any prior cervical injections.  He denies any specific headache or specific trauma.  Review of Systems  Constitutional: Negative for chills, fever,  malaise/fatigue and weight loss.  HENT: Negative for hearing loss and sinus pain.   Eyes: Negative for blurred vision, double vision and photophobia.  Respiratory: Negative for cough and shortness of breath.   Cardiovascular: Negative for chest pain, palpitations and leg swelling.  Gastrointestinal: Negative for abdominal pain, nausea and vomiting.  Genitourinary: Negative for flank pain.  Musculoskeletal: Positive for joint pain and neck pain. Negative for myalgias.  Skin: Negative for itching and rash.  Neurological: Negative for tremors, focal weakness, weakness and headaches.  Endo/Heme/Allergies: Negative.   Psychiatric/Behavioral: Negative for depression.  All other systems reviewed and are negative.  Otherwise per HPI.  Assessment & Plan: Visit Diagnoses:  1. Cervical radiculopathy   2. Spinal stenosis of cervical region   3. Cervical disc disorder with radiculopathy   4. Post laminectomy syndrome     Plan: Findings:  Chronic long-term history of neck pain and shoulder pain but now with recent worsening with referral pattern into the shoulder blades and upper lateral deltoid.  This is more likely a C5 distribution given the findings on MRI.  Could also be myofascial pain he does have some focal trigger points.  He has had therapy and has had trigger point injection without much relief.  Medications are not helping.  It does seem to limit some of the things he like to do.  He clearly does not necessarily want any other surgery.  I think the best approach is cervical epidural injection.  Medication should reach the level above the fusion and we reported to him that we always do these at C7-T1 from a safety standpoint.  Might be an indication to try an epidural injection using a catheter to try to get medicine higher up depending on the relief he gets today.  We talked about activity modification and stretching and exercise.    Meds & Orders:  Meds ordered this encounter  Medications    . methylPREDNISolone acetate (DEPO-MEDROL) injection 80 mg    Orders Placed This Encounter  Procedures  . XR C-ARM NO REPORT  . Epidural Steroid injection    Follow-up: Return in about 2 weeks (around 04/27/2018) for Joni Fears, MD.   Procedures: No procedures performed  Cervical Epidural Steroid Injection - Interlaminar Approach with Fluoroscopic Guidance  Patient: Bradley Oconnor      Date of Birth: July 07, 1940 MRN: [id number] PCP: Wendie Agreste, MD      Visit Date: 04/13/2018   Universal Protocol:    Date/Time: 01/16/205:40 AM  Consent Given By: the patient  Position: PRONE  Additional Comments: Vital signs were monitored before and after the procedure. Patient was prepped and draped in the usual sterile fashion. The correct patient, procedure, and site was verified.   Injection Procedure Details:  Procedure Site One Meds Administered:  Meds ordered this encounter  Medications  . methylPREDNISolone acetate (DEPO-MEDROL) injection 80 mg     Laterality: Right  Location/Site: C7-T1  Needle size: 20 G  Needle type: Touhy  Needle Placement: Paramedian epidural space  Findings:  -Comments: Excellent flow of contrast into the epidural space.  Procedure Details: Using a paramedian approach from the side mentioned above, the region overlying the inferior lamina was localized under fluoroscopic visualization and the soft tissues overlying this structure were infiltrated with 4 ml. of 1% Lidocaine without Epinephrine. A # 20 gauge, Tuohy needle was inserted into the epidural space using a paramedian approach.  The epidural space was localized using loss of resistance along with lateral and contralateral oblique bi-planar fluoroscopic views.  After negative aspirate for air, blood, and CSF, a 2 ml. volume of Isovue-250 was injected into the epidural space and the flow of contrast was observed. Radiographs were obtained for documentation purposes.   The  injectate was administered into the level noted above.  Additional Comments:  The patient tolerated the procedure well Dressing: Band-Aid    Post-procedure details: Patient was observed during the procedure. Post-procedure instructions were reviewed.  Patient left the clinic in stable condition.    Clinical History: MRI CERVICAL SPINE WITHOUT CONTRAST  TECHNIQUE: Multiplanar, multisequence MR imaging of the cervical spine was performed. No intravenous contrast was administered.  COMPARISON:  Radiography 02/10/2018.  MRI 07/07/2008.  FINDINGS: Alignment: Normal  Vertebrae: Distant ACDF C5-C7.  Cord: No cord compression or primary cord lesion.  Posterior Fossa, vertebral arteries, paraspinal tissues: Negative  Disc levels:  Foramen magnum, C1-2 and C2-3 are normal.  C3-4: Degenerative spondylosis with endplate osteophytes and broad-based disc herniation more prominent towards the left. Effacement of the ventral subarachnoid space but no compression of the cord. AP diameter of the canal as narrow as 8.4 mm. Foraminal encroachment left worse than right. Either C4 nerve could be compressed, particularly the left.  C4-5: Endplate osteophytes and shallow broad-based disc herniation. Effacement of the ventral subarachnoid space but no cord compression. AP diameter of the  canal as narrow as 8 mm in the midline. There is bilateral foraminal stenosis because of encroachment by osteophyte and disc material, right more than left.  C5 through C7: Previous ACDF has a good appearance with solid union and wide patency of the canal and foramina.  C7-T1: Bulging of the disc.  No canal or foraminal stenosis.  IMPRESSION: Good appearance in the fusion segment from C5-C7.  C3-4: Endplate osteophytes and broad-based disc herniation more prominent towards the left. Canal narrowing but no cord compression. Bilateral foraminal stenosis could compress either or both  C4 nerves.  C4-5: Endplate osteophytes and broad-based disc herniation. Canal narrowing but no cord compression. Bilateral foraminal stenosis could compress either or both C5 nerves.   Electronically Signed   By: Nelson Chimes M.D.   On: 03/19/2018 16:34   He reports that he has never smoked. He has never used smokeless tobacco. No results for input(s): HGBA1C, LABURIC in the last 8760 hours.  Objective:  VS:  HT:    WT:   BMI:     BP:122/67  HR:(!) 52bpm  TEMP:97.9 F (36.6 C)(Oral)  RESP:  Physical Exam Vitals signs and nursing note reviewed.  Constitutional:      General: He is not in acute distress.    Appearance: Normal appearance. He is well-developed.  HENT:     Head: Normocephalic and atraumatic.  Eyes:     Conjunctiva/sclera: Conjunctivae normal.     Pupils: Pupils are equal, round, and reactive to light.  Neck:     Musculoskeletal: Normal range of motion and neck supple. No neck rigidity.  Cardiovascular:     Rate and Rhythm: Normal rate.     Pulses: Normal pulses.     Heart sounds: Normal heart sounds.  Pulmonary:     Effort: Pulmonary effort is normal. No respiratory distress.  Musculoskeletal:     Right lower leg: No edema.     Left lower leg: No edema.     Comments: Patient does have some stiffness of the cervical spine but has good rotation left and right.  He has some pain with extension.  He has a negative Spurling's test.  He does have tightness and paraspinal tenderness and trigger points in the levator scapula and trapezius bilaterally.  Some shoulder impingement but this not concordant with his pain.  He has good strength in the upper extremities bilaterally and has a negative Hoffmann's test.  Skin:    General: Skin is warm and dry.     Findings: No erythema or rash.  Neurological:     General: No focal deficit present.     Mental Status: He is alert and oriented to person, place, and time.     Sensory: No sensory deficit.     Coordination:  Coordination normal.     Gait: Gait normal.  Psychiatric:        Mood and Affect: Mood normal.        Behavior: Behavior normal.     Ortho Exam Imaging: Xr C-arm No Report  Result Date: 04/13/2018 Please see Notes tab for imaging impression.   Past Medical/Family/Surgical/Social History: Medications & Allergies reviewed per EMR, new medications updated. Patient Active Problem List   Diagnosis Date Noted  . Cervicalgia 03/28/2018  . GERD (gastroesophageal reflux disease) 04/10/2013  . HTN (hypertension) 04/10/2013  . CAD (coronary artery disease) of artery bypass graft 10/04/2012  . Hypothyroidism 10/04/2012  . Hyperlipidemia with target LDL less than 70 10/04/2012  . Kidney stones 01/27/2012  .  Gallstones-symptomatic 01/27/2012  . Hx of appendectomy-history of ruptured requiring ileocecectomy (~1994) 01/27/2012   Past Medical History:  Diagnosis Date  . Blood transfusion without reported diagnosis   . CAD (coronary artery disease)   . GERD (gastroesophageal reflux disease)   . Heart attack ([place])   . Hyperlipidemia   . Hypertension 03/07/12   ECHO-WNL     08/12/11 Lexiscan MyoviewNo significant ischemia demonstrated Low risk scan There is a moderate sized dense scar in the LCX territoy unchanged from the prior study.. Post- stress EF is 40%.  . Thyroid disease    Family History  Problem Relation Age of Onset  . Heart disease Mother   . Cancer Mother        breast, stomach  . Heart disease Father   . Hyperlipidemia Father   . Heart disease Brother   . Diabetes Brother   . Hyperlipidemia Brother   . Heart disease Paternal Grandfather   . Healthy Brother    Past Surgical History:  Procedure Laterality Date  . APPENDECTOMY    . CERVICAL SPINE SURGERY     titanium plate in the back of neck  . CORONARY ARTERY BYPASS GRAFT    . HERNIA REPAIR    . SMALL INTESTINE SURGERY    . 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 Occupation: Retired  Tobacco Use  . Smoking status: Never Smoker  . Smokeless tobacco: Never Used  Substance and Sexual Activity  . Alcohol use: No    Alcohol/week: 0.0 standard drinks  . Drug use: No  . Sexual activity: Never

## 2018-04-14 NOTE — Procedures (Signed)
Cervical Epidural Steroid Injection - Interlaminar Approach with Fluoroscopic Guidance  Patient: Bradley Oconnor      Date of Birth: [birth year] MRN: [id number] PCP: Wendie Agreste, MD      Visit Date: 04/13/2018   Universal Protocol:    Date/Time: 01/16/205:40 AM  Consent Given By: the patient  Position: PRONE  Additional Comments: Vital signs were monitored before and after the procedure. Patient was prepped and draped in the usual sterile fashion. The correct patient, procedure, and site was verified.   Injection Procedure Details:  Procedure Site One Meds Administered:  Meds ordered this encounter  Medications  . methylPREDNISolone acetate (DEPO-MEDROL) injection 80 mg     Laterality: Right  Location/Site: C7-T1  Needle size: 20 G  Needle type: Touhy  Needle Placement: Paramedian epidural space  Findings:  -Comments: Excellent flow of contrast into the epidural space.  Procedure Details: Using a paramedian approach from the side mentioned above, the region overlying the inferior lamina was localized under fluoroscopic visualization and the soft tissues overlying this structure were infiltrated with 4 ml. of 1% Lidocaine without Epinephrine. A # 20 gauge, Tuohy needle was inserted into the epidural space using a paramedian approach.  The epidural space was localized using loss of resistance along with lateral and contralateral oblique bi-planar fluoroscopic views.  After negative aspirate for air, blood, and CSF, a 2 ml. volume of Isovue-250 was injected into the epidural space and the flow of contrast was observed. Radiographs were obtained for documentation purposes.   The injectate was administered into the level noted above.  Additional Comments:  The patient tolerated the procedure well Dressing: Band-Aid    Post-procedure details: Patient was observed during the procedure. Post-procedure instructions were reviewed.  Patient left the clinic in stable  condition.

## 2018-04-28 DIAGNOSIS — H25812 Combined forms of age-related cataract, left eye: Secondary | ICD-10-CM | POA: Diagnosis not present

## 2018-04-28 DIAGNOSIS — H2512 Age-related nuclear cataract, left eye: Secondary | ICD-10-CM | POA: Diagnosis not present

## 2018-06-13 ENCOUNTER — Other Ambulatory Visit: Payer: Self-pay | Admitting: Cardiovascular Disease

## 2018-06-20 ENCOUNTER — Other Ambulatory Visit: Payer: Self-pay | Admitting: Cardiovascular Disease

## 2018-06-29 ENCOUNTER — Telehealth: Payer: Self-pay

## 2018-06-29 ENCOUNTER — Telehealth: Payer: Self-pay | Admitting: *Deleted

## 2018-06-29 NOTE — Telephone Encounter (Signed)
TELEPHONE CALL NOTE  Bradley Oconnor has been deemed a candidate for a follow-up tele-health visit to limit community exposure during the Covid-19 pandemic. I spoke with the patient via phone to ensure availability of phone/video source, confirm preferred email & phone number, and discuss instructions and expectations.  I reminded Bradley Oconnor to be prepared with any vital sign and/or heart rhythm information that could potentially be obtained via home monitoring, at the time of his visit. I reminded Bradley Oconnor to expect a phone call at the time of his visit if his visit.  Did the patient verbally acknowledge consent to treatment?Patient gave verba consent.  Bradley Oconnor, [place] 06/29/2018 2:41 PM   DOWNLOADING THE [place], go to [employer] and type in WebEx in the search bar. [place] [employer], the blue/green circle. The app is free but as with any other app downloads, their phone may require them to verify saved payment information or Apple password. The patient does NOT have to create an account.  - If Android, ask patient to go to [employer] and type in WebEx in the search bar. [place] [employer], the blue/green circle. The app is free but as with any other app downloads, their phone may require them to verify saved payment information or Android password. The patient does NOT have to create an account.   CONSENT FOR TELE-HEALTH VISIT - PLEASE REVIEW  I hereby voluntarily request, consent and authorize [facility] and its employed or contracted physicians, physician assistants, nurse practitioners or other licensed health care professionals (the Practitioner), to provide me with telemedicine health care services (the "Services") as deemed necessary by the treating Practitioner. I acknowledge and consent to receive the Services by the Practitioner via telemedicine. I understand that the telemedicine visit  will involve communicating with the Practitioner through live audiovisual communication technology and the disclosure of certain medical information by electronic transmission. I acknowledge that I have been given the opportunity to request an in-person assessment or other available alternative prior to the telemedicine visit and am voluntarily participating in the telemedicine visit.  I understand that I have the right to withhold or withdraw my consent to the use of telemedicine in the course of my care at any time, without affecting my right to future care or treatment, and that the Practitioner or I may terminate the telemedicine visit at any time. I understand that I have the right to inspect all information obtained and/or recorded in the course of the telemedicine visit and may receive copies of available information for a reasonable fee.  I understand that some of the potential risks of receiving the Services via telemedicine include:  Marland Kitchen Delay or interruption in medical evaluation due to technological equipment failure or disruption; . Information transmitted may not be sufficient (e.g. poor resolution of images) to allow for appropriate medical decision making by the Practitioner; and/or  . In rare instances, security protocols could fail, causing a breach of personal health information.  Furthermore, I acknowledge that it is my responsibility to provide information about my medical history, conditions and care that is complete and accurate to the best of my ability. I acknowledge that Practitioner's advice, recommendations, and/or decision may be based on factors not within their control, such as incomplete or inaccurate data provided by me or distortions of diagnostic images or specimens that may result from electronic transmissions. I understand that the practice  of medicine is not an Chief Strategy Officer and that Practitioner makes no warranties or guarantees regarding treatment outcomes. I acknowledge  that I will receive a copy of this consent concurrently upon execution via email to the email address I last provided but may also request a printed copy by calling the office of [place].    I understand that my insurance will be billed for this visit.   I have read or had this consent read to me. . I understand the contents of this consent, which adequately explains the benefits and risks of the Services being provided via telemedicine.  . I have been provided ample opportunity to ask questions regarding this consent and the Services and have had my questions answered to my satisfaction. . I give my informed consent for the services to be provided through the use of telemedicine in my medical care  By participating in this telemedicine visit I agree to the above.

## 2018-06-30 ENCOUNTER — Other Ambulatory Visit: Payer: Self-pay

## 2018-06-30 NOTE — Telephone Encounter (Signed)
error 

## 2018-07-01 ENCOUNTER — Telehealth (INDEPENDENT_AMBULATORY_CARE_PROVIDER_SITE_OTHER): Payer: PPO | Admitting: Cardiovascular Disease

## 2018-07-01 ENCOUNTER — Encounter: Payer: Self-pay | Admitting: Cardiovascular Disease

## 2018-07-01 VITALS — Ht 69.0 in

## 2018-07-01 DIAGNOSIS — I10 Essential (primary) hypertension: Secondary | ICD-10-CM

## 2018-07-01 DIAGNOSIS — K219 Gastro-esophageal reflux disease without esophagitis: Secondary | ICD-10-CM | POA: Diagnosis not present

## 2018-07-01 DIAGNOSIS — M542 Cervicalgia: Secondary | ICD-10-CM

## 2018-07-01 DIAGNOSIS — E782 Mixed hyperlipidemia: Secondary | ICD-10-CM

## 2018-07-01 DIAGNOSIS — I25709 Atherosclerosis of coronary artery bypass graft(s), unspecified, with unspecified angina pectoris: Secondary | ICD-10-CM

## 2018-07-01 DIAGNOSIS — E785 Hyperlipidemia, unspecified: Secondary | ICD-10-CM

## 2018-07-01 MED ORDER — HYDROCHLOROTHIAZIDE 12.5 MG PO CAPS: 12.5000 mg | ORAL_CAPSULE | Freq: Every day | ORAL | 3 refills | Status: DC | PRN

## 2018-07-01 NOTE — Patient Instructions (Signed)
Medication Instructions:  Keep taking HCTZ 12.5 mg as needed.  If you need a refill on your cardiac medications before your next appointment, please call your pharmacy.   Lab work: Fasting lab work (CBC, CMET, TSH, LIPID, A1C) in 1 month  Attached are the lab orders that are needed before your upcoming appointment, please come in anytime to have your labs drawn.   They are fasting labs, so nothing to eat or drink after midnight.  Lab hours: 8:00-4:00 lunch hours 12:45-1:45  If you have labs (blood work) drawn today and your tests are completely normal, you will receive your results only by: Marland Kitchen MyChart Message (if you have MyChart) OR . A paper copy in the mail If you have any lab test that is abnormal or we need to change your treatment, we will call you to review the results.  Follow-Up: At [facility], you and your health needs are our priority.  As part of our continuing mission to provide you with exceptional heart care, we have created designated Provider Care Teams.  These Care Teams include your primary Cardiologist (physician) and Advanced Practice Providers (APPs -  Physician Assistants and Nurse Practitioners) who all work together to provide you with the care you need, when you need it. You will need a follow up appointment in 6 months.  Please call our office 2 months in advance to schedule this appointment.  You may see Dr.Kelly or one of the following Advanced Practice Providers on your designated Care Team: Almyra Deforest, [place] . Fabian Sharp, PA-C

## 2018-07-01 NOTE — Progress Notes (Signed)
Virtual Visit via Telephone Note     Evaluation Performed:  Follow-up visit  This visit type was conducted due to national recommendations for restrictions regarding the COVID-19 Pandemic (e.g. social distancing).  This format is felt to be most appropriate for this patient at this time.  All issues noted in this document were discussed and addressed.  No physical exam was performed (except for noted visual exam findings with Video Visits).  Please refer to the patient's chart (MyChart message for video visits and phone note for telephone visits) for the patient's consent to telehealth for [facility].  Date:  07/01/2018   ID:  Bradley Oconnor, DOB [birth year], MRN [id number]  Patient Location: [place] [address]   Provider location:   [facility]  PCP:  Wendie Agreste, MD  Cardiologist:  No primary care provider on file.  Bradley Sine MD  Electrophysiologist:  None   Chief Complaint: Follow-up of coronary artery disease hypertension, hyperlipidemia  History of Present Illness:    Bradley Oconnor is a 78 y.o. male who presents via audio/video conferencing for a telehealth visit today.    The patient does not have symptoms concerning for COVID-19 infection (fever, chills, cough, or new SHORTNESS OF BREATH).   Bradley Oconnor has established CAD dating back to 1992 when he suffered an inferior wall myocardial infarction and underwent PTCA of a totally occluded RCA. In 1993 due to progressive CAD, he underwent CABG surgery with a LIMA to his LAD, vein graft sequentially to a diagonal and marginal vein graft to his PDA branch of his right carotid artery. In September 2002 a stent was placed the PLA of his RCA.  In June 2011, he suffered a non-ST segment elevation MI which was felt to be due to RCA graft occlusion which supplied the PDA and PLA vessel. The PDA was extensively collateralized now via the left circumflex territory. His native RCA was  totally occluded at the mid level. His LIMA graft is widely patent as was the sequential graft to the diagonal marginal vessel. He has done well particularly with the addition of Ranexa titrated up to 1000 twice a day added to his medical regimen. He believes the Ranexa has made a huge difference in his anginal symptomatology. He is unaware of palpitations he denies presyncope or syncope.  Additional problems include GERD, hyperlipidemia, hypertension.  In 2015, he experienced 3 short-lived episodes of some mild chest pain.  He feels that his CAD is stable.  His wife unfortunately has suffered multiple small strokes and she is now fairly incapacitated and he spends much of his time caring for her.  As result, he has not been as active as he had in the past.  Over the past year, he denies recurrent anginal symptoms. He denies dizziness.  He he has been on ramipril 10 mg, amlodipine 5 mg, HCTZ 12.5 mg as needed for edema, in addition to Toprol-XL 125 mg and isosorbide 120 mg.  He denies bleeding on aspirin.  He has been taking 325 mg aspirin.  He also has been on ranolazine 1000 g twice a day.  He is was recently switched by his insurance company to generic Crestor 20 mg daily, which he has been on for 30 days and also takes over-the-counter fish oil. He has hypothyroidism on Synthroid replacement and Prevacid for GERD.   He underwent an echo Doppler study on 07/07/2016 which showed normal systolic function with an EF of 55-60%.  There was grade 1 diastolic dysfunction.  He had mild aortic sclerosis with trivial AR, mild to moderate mitral regurgitation, and had biatrial enlargement, left greater than right.  Peak PA pressure was 25 mm  When I saw [name] at which time he remained stable.  He denies any recurrent chest pain.  He believes the addition of Ranexa to his medical regimen  made dramatic improvement in his symptomatology.   I last saw him in March 2019.  Over the past year, he has  continued to do remarkably well on an aggressive medical regimen consisting of metoprolol succinate 125 mg daily, amlodipine 5 mg, isosorbide 120 mg, and ranolazine 1000 mg twice a day.  Over the past 8 months he admits to 3 short-lived episodes of chest discomfort which lasted approximately 10 to 15 minutes and ultimately went away on its own.  If he starts out walking more rapidly he may note some mild shortness of breath initially but this improves as he continues to walk.  He has continued to cut his grass at home and do yard work without typical anginal symptoms.  He does have periods of intermittent ankle swelling for which she has been taking HCTZ 12.5 mg as needed.  His he took a dose yesterday since yesterday there was ankle swelling.  He also has issues with GERD and it is becoming more difficult to obtain Prevacid and as result he now is on Nexium and as long as he takes treatment he does not have reflux symptomatology.  He continues to care for his disabled wife who also has significant dementia.  She was hospitalized until early this week with recent pneumonia and urinary infection.  She is now back home.  As result he has not been able to exercise regularly due to caring for her.  He has continued to be on rosuvastatin for hyperlipidemia.  He continues to be on omega-3 fatty acid. Two years ago triglyceride levels were significantly elevated which did improve with omega-3 fatty acid therapy.  He has not had laboratory checked in a year.  He had recently developed significant neck pain and has undergone Cervical epidural cortisone injection which he states helped for approximately 2-1/2 weeks but his symptoms have recurred.  He denies any paresthesias.  However he may ultimately require neck surgery.  This evaluation is being performed by telemedicine.    Prior CV studies:   The following studies were reviewed today:  I reviewed his prior cardiac catheterization data, his echo Doppler studies.  I  have also reviewed recent evaluation by Dr. Joni Fears concerning his cervicalgia.  Past Medical History:  Diagnosis Date   Blood transfusion without reported diagnosis    CAD (coronary artery disease)    GERD (gastroesophageal reflux disease)    Heart attack ([place])    Hyperlipidemia    Hypertension 03/07/12   ECHO-WNL     08/12/11 Lexiscan MyoviewNo significant ischemia demonstrated Low risk scan There is a moderate sized dense scar in the LCX territoy unchanged from the prior study.. Post- stress EF is 40%.   Thyroid disease    Past Surgical History:  Procedure Laterality Date   APPENDECTOMY     CERVICAL SPINE SURGERY     titanium plate in the back of neck   CORONARY ARTERY BYPASS GRAFT     HERNIA REPAIR     SMALL INTESTINE SURGERY     THYROID SURGERY     1/2 thyroid removed on right side  Current Meds  Medication Sig   amLODipine (NORVASC) 5 MG tablet TAKE 1 TABLET BY MOUTH ONCE DAILY   aspirin EC 81 MG tablet Take 1 tablet (81 mg total) by mouth daily.   fish oil-omega-3 fatty acids 1000 MG capsule Take 1 g by mouth daily.    Garlic 2992 MG CAPS Take 1 capsule by mouth daily.    hydrochlorothiazide (MICROZIDE) 12.5 MG capsule Take 1 capsule (12.5 mg total) by mouth daily as needed (fluid and edema).   isosorbide mononitrate (IMDUR) 120 MG 24 hr tablet Take 1 tablet by mouth once daily   lansoprazole (PREVACID) 15 MG capsule Take 15 mg by mouth daily.   levothyroxine (SYNTHROID, LEVOTHROID) 100 MCG tablet TAKE 1 TABLET BY MOUTH IN THE MORNING BEFORE BREAKFAST   levothyroxine (SYNTHROID, LEVOTHROID) 100 MCG tablet TAKE 1 TABLET BY MOUTH IN THE MORNING   metoprolol succinate (TOPROL-XL) 100 MG 24 hr tablet TAKE 1 AND 1/4 TABLETY BY MOUTH ONCE A DAY   nitroGLYCERIN (NITROSTAT) 0.4 MG SL tablet DISSOLVE ONE TABLET UNDER THE TONGUE EVERY 5 MINUTES AS NEEDED FOR CHEST PAIN.  DO NOT EXCEED A TOTAL OF 3 DOSES IN 15 MINUTES   NITROSTAT 0.4 MG SL tablet  DISSOLVE ONE TABLET UNDER THE TONGUE EVERY 5 MINUTES AS NEEDED FOR CHEST PAIN.  DO NOT EXCEED A TOTAL OF 3 DOSES IN 15 MINUTES   ramipril (ALTACE) 10 MG capsule Take 1 capsule by mouth once daily   ranolazine (RANEXA) 1000 MG SR tablet TAKE 1 TABLET BY MOUTH TWICE DAILY   rosuvastatin (CRESTOR) 20 MG tablet TAKE 1 TABLET BY MOUTH ONCE DAILY IN THE EVENING     Allergies:   Procardia [nifedipine] and Phenergan [promethazine hcl]   Social History   Tobacco Use   Smoking status: Never Smoker   Smokeless tobacco: Never Used  Substance Use Topics   Alcohol use: No    Alcohol/week: 0.0 standard drinks   Drug use: No     Family Hx: The patient's family history includes Cancer in his mother; Diabetes in his brother; Healthy in his brother; Heart disease in his brother, father, mother, and paternal grandfather; Hyperlipidemia in his brother and father.  ROS:   Please see the history of present illness.    He denies any unstable chest symptomatology but has noticed rare anginal episodes.  He denies PND orthopnea.  At times there is some very mild shortness of breath with early exercise which improves.  He continues to be active cutting the grass.  He has been bothered by cervical pain and an MRI scan demonstrated solid fusion between C5 and C7 with endplate osteophytes and shallow broad-based disc herniation at C3-4 and C4-5.  No palpitations.  No recent abdominal pain but as long as he takes his PPI his GERD is controlled.  Occasional episodes of ankle swelling for which he takes HCTZ. All other systems reviewed and are negative.   Labs/Other Tests and Data Reviewed:    Recent Labs: No results found for requested labs within last 8760 hours.   Recent Lipid Panel Lab Results  Component Value Date/Time   CHOL 141 06/18/2017 12:33 PM   TRIG 137 06/18/2017 12:33 PM   HDL 44 06/18/2017 12:33 PM   CHOLHDL 3.2 06/18/2017 12:33 PM   CHOLHDL 4.1 04/29/2016 12:13 PM   LDLCALC 70  06/18/2017 12:33 PM    Wt Readings from Last 3 Encounters:  03/28/18 194 lb (88 kg)  03/07/18 197 lb (89.4 kg)  02/10/18  197 lb 6.4 oz (89.5 kg)     Exam:    Vital Signs:  Ht 5\' 9"  (1.753 m)    BMI 28.65 kg/m    He took his blood pressure today and this was 127/73 with a heart rate at 51.  Well nourished, well developed male in no acute distress. His respirations are unlabored. He admits to neck discomfort.  He is unaware of any neck vein distention.  He denies any wheezing or chest wall tenderness to his chest.  His abdomen is nontender to palpation.  He had ankle swelling yesterday but this improved with his HCTZ and denies current swelling at this time.  He denies any paresthesias to his arms bilaterally.  He is aware of any bleeding.  He denies any neurologic symptoms.  ASSESSMENT & PLAN:    1.  CAD/CABG surgery: Initial inferior MI 1992 treated with PTCA of RCA.  CABG surgery 1993.  September 2002 stent to the PLA branch of RCA.  Non-STEMI 2011 due to RCA graft occlusion with extensive fully collateralized native RCA which proximally was occluded.  He is on a fairly maximal medical regimen consisting of ranolazine 1000 mg twice a day, Imdur 120 mg, amlodipine 5 mg as well as metoprolol succinate 125 mg.  He has class I anginal symptoms.  This has not significantly progressed and he has had 3 episodes over the past 6 months.  He has not had recent laboratory on the above medical regimen.  I have recommended a complete set of laboratory.  2. Hyperlipidemia.  Previous mixed hyperlipidemia.  Currently on rosuvastatin 20 mg and omega-3 fatty acids.  Last lipid study March 2019 was markedly improved with triglycerides now 137 from 252.  LDL was 70.  I will recheck laboratory.  If triglycerides have increased further, I will switch him to the  Vascepa 2 capsules twice a day.  3.  Essential hypertension: Controlled on current therapy with blood pressure today 127/73.  He states at times he  does have some blood pressure lability in his blood pressure has increased at times to the 140-150 range.  Grade 1 diastolic dysfunction noted on 2018 echo.  4. GERD, currently controlled with Nexium daily.  5.  Aortic valve sclerosis;   6. Cervical radiculopathy/cervicalgia: Recent cervical epidural steroid injection.  Now with recurrent symptoms.  If he ultimately requires surgery, I have recommended that he undergo a preoperative Lexiscan Myoview study since his last evaluation was in 2013.  It was recommended that the patient have a complete set of fasting laboratory including CBC, CMP, TSH, lipid panel, and hemoglobin A1c.   COVID-19 Education: The signs and symptoms of COVID-19 were discussed with the patient and how to seek care for testing (follow up with PCP or arrange E-visit). The importance of social distancing was discussed today.  Patient Risk:   After full review of this patients clinical status, I feel that they are at least moderate risk at this time.  Time:   Today, I have spent 25 minutes with the patient with telehealth technology discussing the evaluation and treatment as noted above.   Medication Adjustments/Labs and Tests Ordered: Current medicines are reviewed at length with the patient today.  Concerns regarding medicines are outlined above.  Tests Ordered: No orders of the defined types were placed in this encounter.  Medication Changes: No orders of the defined types were placed in this encounter.   Disposition: 6 months, unless he is in need for cervical surgery I will then  need to see him sooner and he will need a preoperative Lexiscan Myoview study.  Signed, Shelva Majestic, MD  07/01/2018 11:08 AM    [place]

## 2018-07-15 IMAGING — DX DG CHEST 2V
2 series · 2 of 2 positions shown · non-contrast
Comparison: Radiographs December 22, 2016.

CLINICAL DATA: Left rib pain.

EXAM:
CHEST  2 VIEW

[chest pa]
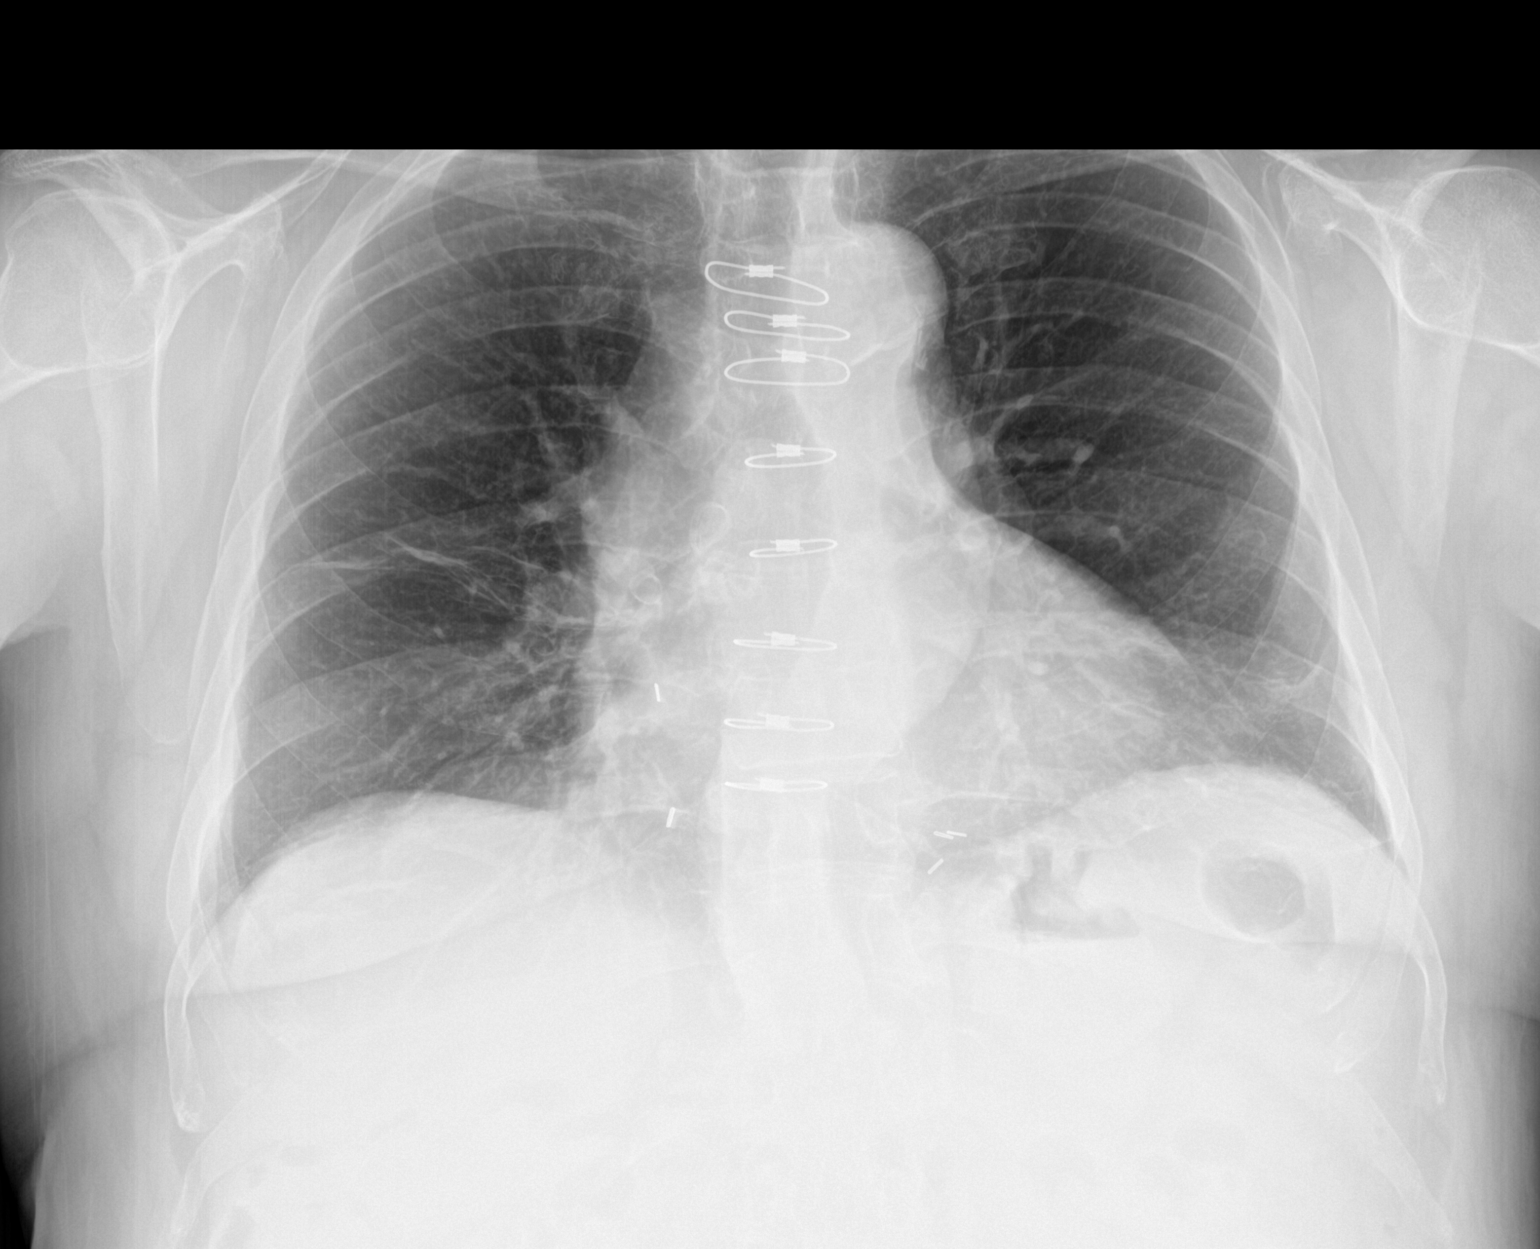

[chest lat]
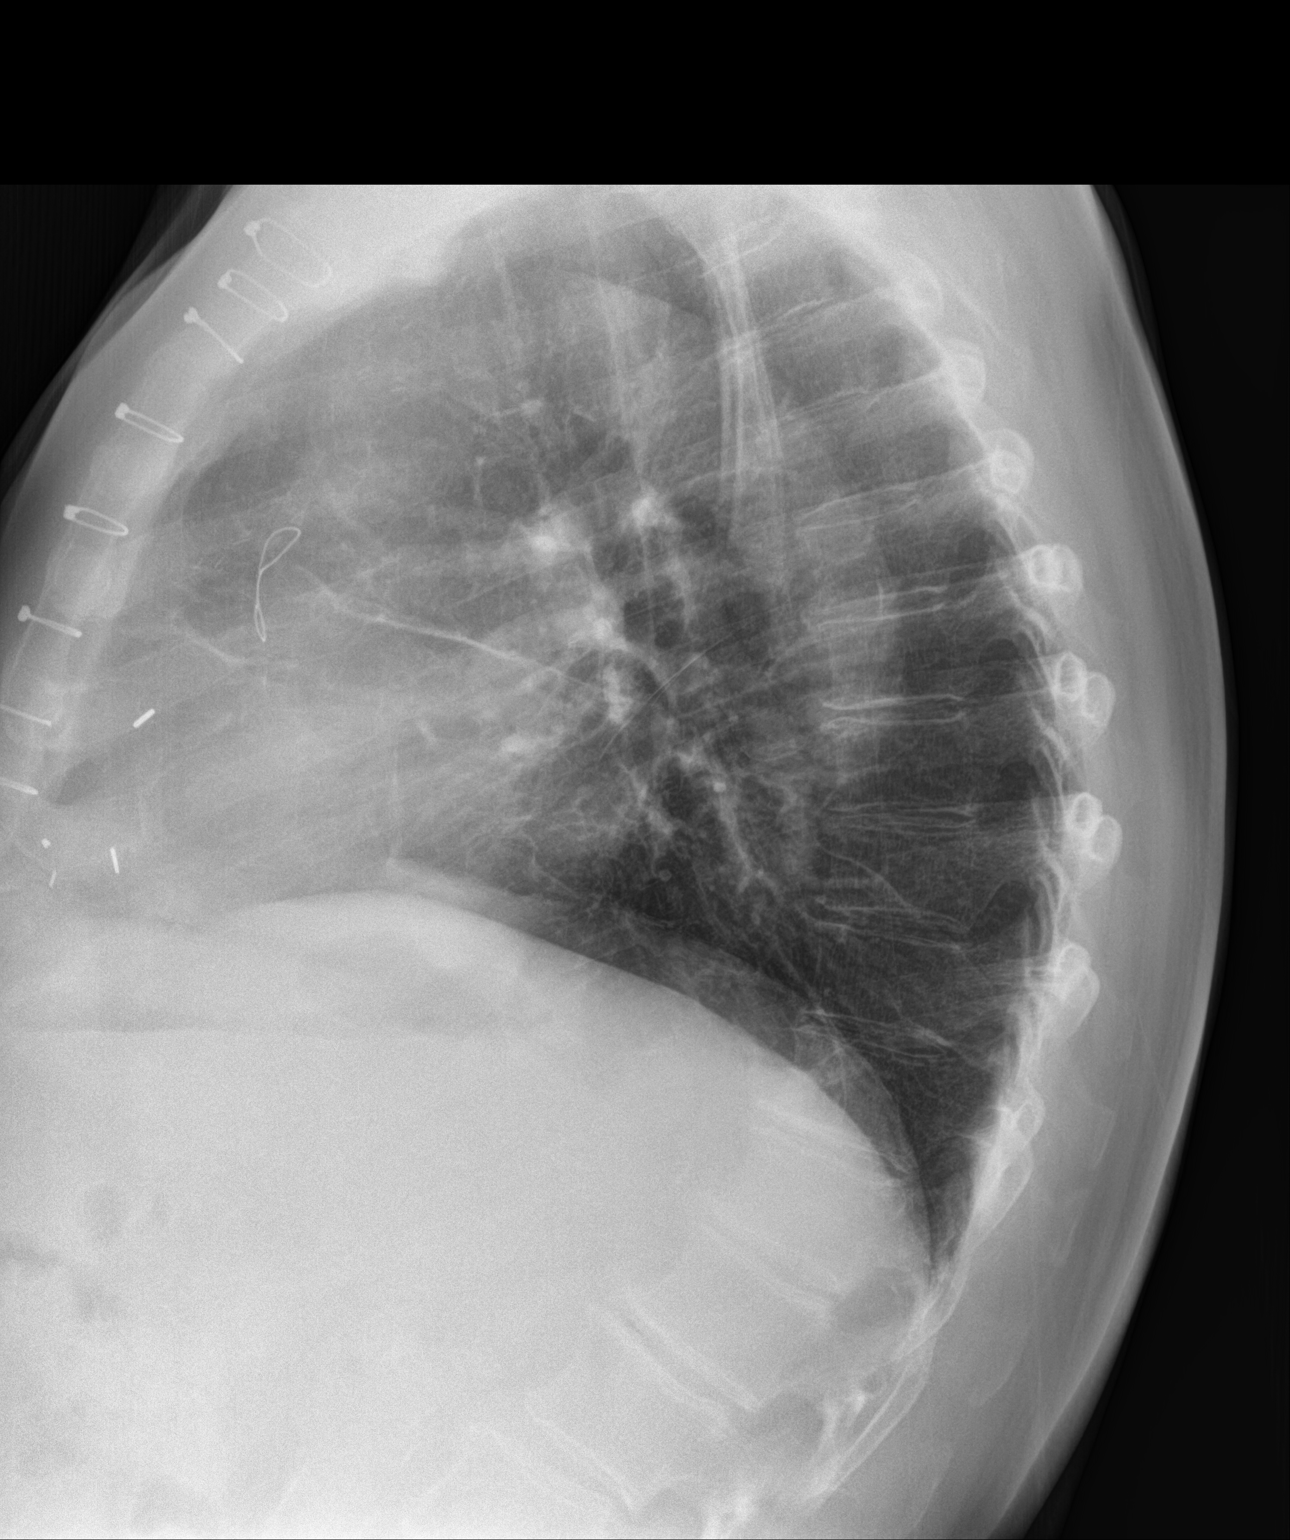

[2 of 2 positions shown; findings below may reference images not displayed]

FINDINGS: Stable cardiomediastinal silhouette. Sternotomy wires are noted.
Atherosclerosis of thoracic aorta is noted. No pneumothorax or
pleural effusion is noted. Stable mild scarring or subsegmental
atelectasis is noted in right perihilar region. Stable left basilar
subsegmental atelectasis or scarring is noted. Bony thorax is
unremarkable.
IMPRESSION: Stable bilateral mild subsegmental atelectasis or scarring. Aortic
atherosclerosis. No significant change compared to prior exam.

## 2018-08-08 ENCOUNTER — Other Ambulatory Visit: Payer: Self-pay | Admitting: Cardiovascular Disease

## 2018-09-06 ENCOUNTER — Other Ambulatory Visit: Payer: Self-pay | Admitting: Cardiovascular Disease

## 2018-10-06 ENCOUNTER — Other Ambulatory Visit: Payer: Self-pay | Admitting: Cardiovascular Disease

## 2018-10-24 ENCOUNTER — Other Ambulatory Visit: Payer: Self-pay | Admitting: Cardiovascular Disease

## 2018-12-07 ENCOUNTER — Telehealth: Payer: Self-pay

## 2018-12-07 MED ORDER — METOPROLOL SUCCINATE ER 100 MG PO TB24: ORAL_TABLET | ORAL | 0 refills | Status: DC

## 2018-12-07 NOTE — Telephone Encounter (Signed)
Requested Prescriptions   Signed Prescriptions Disp Refills  . metoprolol succinate (TOPROL-XL) 100 MG 24 hr tablet 40 tablet 0    Sig: TAKE 1 & 1/4 (ONE & ONE-FOURTH) TABLETS BY MOUTH ONCE DAILY *PLEASE CALL TO SCHEDULE APPOINTMENT FOR FURTHER REFILLS*    Authorizing Provider: Shelva Majestic A    Ordering User: NEWCOMER MCCLAIN, BRANDY L

## 2018-12-20 ENCOUNTER — Other Ambulatory Visit: Payer: Self-pay | Admitting: Cardiovascular Disease

## 2019-01-31 ENCOUNTER — Other Ambulatory Visit: Payer: Self-pay | Admitting: Cardiovascular Disease

## 2019-02-17 ENCOUNTER — Other Ambulatory Visit: Payer: Self-pay | Admitting: Cardiovascular Disease

## 2019-03-17 ENCOUNTER — Ambulatory Visit (INDEPENDENT_AMBULATORY_CARE_PROVIDER_SITE_OTHER): Payer: PPO | Admitting: Family Medicine

## 2019-03-17 ENCOUNTER — Other Ambulatory Visit: Payer: Self-pay

## 2019-03-17 DIAGNOSIS — Z23 Encounter for immunization: Secondary | ICD-10-CM | POA: Diagnosis not present

## 2019-03-17 DIAGNOSIS — E782 Mixed hyperlipidemia: Secondary | ICD-10-CM

## 2019-03-17 DIAGNOSIS — I10 Essential (primary) hypertension: Secondary | ICD-10-CM

## 2019-03-17 DIAGNOSIS — I25709 Atherosclerosis of coronary artery bypass graft(s), unspecified, with unspecified angina pectoris: Secondary | ICD-10-CM

## 2019-04-04 ENCOUNTER — Other Ambulatory Visit: Payer: Self-pay

## 2019-04-04 ENCOUNTER — Ambulatory Visit (INDEPENDENT_AMBULATORY_CARE_PROVIDER_SITE_OTHER): Payer: PPO | Admitting: Cardiovascular Disease

## 2019-04-04 ENCOUNTER — Encounter: Payer: Self-pay | Admitting: Cardiovascular Disease

## 2019-04-04 VITALS — BP 106/66 | HR 51 | Temp 97.2°F | Ht 69.0 in | Wt 187.6 lb

## 2019-04-04 DIAGNOSIS — I10 Essential (primary) hypertension: Secondary | ICD-10-CM

## 2019-04-04 DIAGNOSIS — E039 Hypothyroidism, unspecified: Secondary | ICD-10-CM | POA: Diagnosis not present

## 2019-04-04 DIAGNOSIS — E785 Hyperlipidemia, unspecified: Secondary | ICD-10-CM

## 2019-04-04 DIAGNOSIS — I519 Heart disease, unspecified: Secondary | ICD-10-CM

## 2019-04-04 DIAGNOSIS — I25709 Atherosclerosis of coronary artery bypass graft(s), unspecified, with unspecified angina pectoris: Secondary | ICD-10-CM | POA: Diagnosis not present

## 2019-04-04 DIAGNOSIS — E782 Mixed hyperlipidemia: Secondary | ICD-10-CM

## 2019-04-04 DIAGNOSIS — K219 Gastro-esophageal reflux disease without esophagitis: Secondary | ICD-10-CM | POA: Diagnosis not present

## 2019-04-04 DIAGNOSIS — I5189 Other ill-defined heart diseases: Secondary | ICD-10-CM

## 2019-04-04 NOTE — Progress Notes (Signed)
Patient ID: TONYA CARLILE, male   DOB: [birth year], 79 y.o.   MRN: [id number]     HPI: KASHTEN GOWIN, is a 79 y.o. male who presents to the office for a 7 month follow-up cardiology evaluation.  Mr. Lalla has established CAD dating back to 1992 when he suffered an inferior wall myocardial infarction and underwent PTCA of a totally occluded RCA. In 1993 due to progressive CAD, he underwent CABG surgery with a LIMA to his LAD, vein graft sequentially to a diagonal and marginal vein graft to his PDA branch of his right carotid artery. In September 2002 a stent was placed the PLA of his RCA.  In June 2011, he suffered a non-ST segment elevation MI which was felt to be due to RCA graft occlusion which supplied the PDA and PLA vessel. The PDA was extensively collateralized now via the left circumflex territory. His native RCA was totally occluded at the mid level. His LIMA graft is widely patent as was the sequential graft to the diagonal marginal vessel. He has done well particularly with the addition of Ranexa titrated up to 1000 twice a day added to his medical regimen. He believes the Ranexa has made a huge difference in his anginal symptomatology. He is unaware of palpitations he denies presyncope or syncope.  Additional problems include GERD, hyperlipidemia, hypertension.  In 2015, he experienced 3 short-lived episodes of some mild chest pain.  He feels that his CAD is stable.  His wife unfortunately has suffered multiple small strokes and she is now fairly incapacitated and he spends much of his time caring for her.  As result, he has not been as active as he had in the past.  Over the past year, he denies recurrent anginal symptoms. He denies dizziness.  He he has been on ramipril 10 mg, amlodipine 5 mg, HCTZ 12.5 mg as needed for edema, in addition to Toprol-XL 125 mg and isosorbide 120 mg.  He denies bleeding on aspirin.  He has been taking 325 mg aspirin.  He also has been on ranolazine 1000 g  twice a day.  He is was recently switched by his insurance company to generic Crestor 20 mg daily, which he has been on for 30 days and also takes over-the-counter fish oil. He has hypothyroidism on Synthroid replacement and Prevacid for GERD.   He underwent an echo Doppler study on 07/07/2016.  This showed normal systolic function with an EF of 55-60%.  There was grade 1 diastolic dysfunction.  He had mild aortic sclerosis with trivial AR, mild to moderate mitral regurgitation, and had biatrial enlargement, left greater than right.  Peak PA pressure was 25 mm  I  saw [name] at which time he remained stable.  He denies any recurrent chest pain.  He believes the addition of Ranexa to his medical regimen.  Several years ago was again change her and made dramatic improvement in his symptomatology.  He has not been able to exercise as regularly and often times does not sleep as well in caring for his disabled wife. She had suffered multiple strokes in addition has dementia. He is unaware of PND, orthopnea, palpitations, presyncope or syncope.  I  saw him in March 2019 and last evaluated him in a telemedicine visit on July 01, 2018 due to COVID-19 pandemic.  Over the year prior to that evaluation he had continued to do remarkably well on an aggressive medical regimen consisting of metoprolol succinate 125 mg daily, amlodipine  5 mg, isosorbide 120 mg, and ranolazine 1000 mg twice a day.  Over the  8 months prior to the telemedicine visit he admitted to 3 short-lived episodes of chest discomfort which lasted approximately 10 to 15 minutes and ultimately went away on its own.  If he starts out walking more rapidly he may note some mild shortness of breath initially but this improves as he continues to walk.  He has continued to cut his grass at home and do yard work without typical anginal symptoms.  He does have periods of intermittent ankle swelling for which he has been taking HCTZ 12.5 mg as needed.    He also has issues with GERD and it is becoming more difficult to obtain Prevacid and as result he now is on Nexium and as long as he takes treatment he does not have reflux symptomatology.  He continues to care for his disabled wife who also has significant dementia.  She was hospitalized until early this week with recent pneumonia and urinary infection.  She is now back home.  As result he has not been able to exercise regularly due to caring for her.  He has continued to be on rosuvastatin for hyperlipidemia.  He continues to be on omega-3 fatty acid. Two years ago triglyceride levels were significantly elevated which did improve with omega-3 fatty acid therapy.  He has not had laboratory checked in a year.  He had recently developed significant neck pain and has undergone Cervical epidural cortisone injection which he states helped for approximately 2-1/2 weeks but his symptoms have recurred.   Since his last telemedicine evaluation, he has continued to feel well.  He has only required taking sublingual nitroglycerin on 2 occasions over the past 9 months.  His blood pressure has been stable.  He is unaware of palpitations presyncope or syncope.  He is continuing to care for his disabled wife.  He presents for evaluation.   Past Medical History:  Diagnosis Date  . Blood transfusion without reported diagnosis   . CAD (coronary artery disease)   . GERD (gastroesophageal reflux disease)   . Heart attack ([place])   . Hyperlipidemia   . Hypertension 03/07/12   ECHO-WNL     08/12/11 Lexiscan MyoviewNo significant ischemia demonstrated Low risk scan There is a moderate sized dense scar in the LCX territoy unchanged from the prior study.. Post- stress EF is 40%.  . Thyroid disease     Past Surgical History:  Procedure Laterality Date  . APPENDECTOMY    . CERVICAL SPINE SURGERY     titanium plate in the back of neck  . CORONARY ARTERY BYPASS GRAFT    . HERNIA REPAIR    . SMALL INTESTINE SURGERY    .  THYROID SURGERY     1/2 thyroid removed on right side    Allergies  Allergen Reactions  . Procardia [Nifedipine] Other (See Comments)    Lowers bp   . Phenergan [Promethazine Hcl] Nausea And Vomiting    Current Outpatient Medications  Medication Sig Dispense Refill  . amLODipine (NORVASC) 5 MG tablet Take 1 tablet by mouth once daily 90 tablet 0  . aspirin EC 81 MG tablet Take 1 tablet (81 mg total) by mouth daily. 90 tablet 3  . EUTHYROX 100 MCG tablet TAKE 1 TABLET BY MOUTH IN THE MORNING 90 tablet 0  . fish oil-omega-3 fatty acids 1000 MG capsule Take 1 g by mouth daily.     . Garlic 2952 MG CAPS  Take 1 capsule by mouth daily.     . hydrochlorothiazide (MICROZIDE) 12.5 MG capsule Take 1 capsule (12.5 mg total) by mouth daily as needed (fluid and edema). 90 capsule 3  . isosorbide mononitrate (IMDUR) 120 MG 24 hr tablet Take 1 tablet (120 mg total) by mouth daily. 90 tablet 2  . lansoprazole (PREVACID) 15 MG capsule Take 15 mg by mouth daily.    Marland Kitchen levothyroxine (SYNTHROID, LEVOTHROID) 100 MCG tablet TAKE 1 TABLET BY MOUTH IN THE MORNING BEFORE BREAKFAST 90 tablet 1  . metoprolol succinate (TOPROL-XL) 100 MG 24 hr tablet TAKE 1 & 1/4 (ONE & ONE-FOURTH) TABLETS BY MOUTH ONCE DAILY 180 tablet 0  . nitroGLYCERIN (NITROSTAT) 0.4 MG SL tablet DISSOLVE ONE TABLET UNDER THE TONGUE EVERY 5 MINUTES AS NEEDED FOR CHEST PAIN.  DO NOT EXCEED A TOTAL OF 3 DOSES IN 15 MINUTES 25 tablet 6  . NITROSTAT 0.4 MG SL tablet DISSOLVE ONE TABLET UNDER THE TONGUE EVERY 5 MINUTES AS NEEDED FOR CHEST PAIN.  DO NOT EXCEED A TOTAL OF 3 DOSES IN 15 MINUTES 25 tablet 3  . ramipril (ALTACE) 10 MG capsule Take 1 capsule by mouth once daily 90 capsule 0  . ranolazine (RANEXA) 1000 MG SR tablet TAKE 1  BY MOUTH TWICE DAILY 60 tablet 0  . rosuvastatin (CRESTOR) 20 MG tablet TAKE 1 TABLET BY MOUTH ONCE DAILY IN THE EVENING 90 tablet 0   No current facility-administered medications for this visit.    Socially he is  married. He does try to do some exercise. He remains relatively active with yard work. Is no tobacco or alcohol use.  ROS General: Negative; No fevers, chills, or night sweats;  HEENT: Uses a hearing aid; No changes in vision , sinus congestion, difficulty swallowing Pulmonary: Negative; No cough, wheezing, shortness of breath, hemoptysis Cardiovascular: See HPI GI: Negative; No nausea, vomiting, diarrhea, or abdominal pain GU: Negative; No dysuria, hematuria, or difficulty voiding Musculoskeletal: Negative; no myalgias, joint pain, or weakness Hematologic/Oncology: Negative; no easy bruising, bleeding Endocrine: Negative; no heat/cold intolerance; no diabetes Neuro: Negative; no changes in balance, headaches Skin: Negative; No rashes or skin lesions Psychiatric: Negative; No behavioral problems, depression Sleep: Negative; No snoring, daytime sleepiness, hypersomnolence, bruxism, restless legs, hypnogognic hallucinations, no cataplexy Other comprehensive 14 point system review is negative.   PE BP 106/66   Pulse (!) 51   Temp (!) 97.2 F (36.2 C)   Ht 5' 9"  (1.753 m)   Wt 187 lb 9.6 oz (85.1 kg)   SpO2 97%   BMI 27.70 kg/m    Repeat blood pressure by me was 110/60  Wt Readings from Last 3 Encounters:  04/04/19 187 lb 9.6 oz (85.1 kg)  03/28/18 194 lb (88 kg)  03/07/18 197 lb (89.4 kg)   General: Alert, oriented, no distress.  Skin: normal turgor, no rashes, warm and dry HEENT: Normocephalic, atraumatic. Pupils equal round and reactive to light; sclera anicteric; extraocular muscles intact;  Nose without nasal septal hypertrophy Mouth/Parynx benign; Mallinpatti scale 3 Neck: No JVD, no carotid bruits; normal carotid upstroke Lungs: clear to ausculatation and percussion; no wheezing or rales Chest wall: without tenderness to palpitation Heart: PMI not displaced, RRR, s1 s2 normal, 9-8/9 systolic murmur, no diastolic murmur, no rubs, gallops, thrills, or heaves Abdomen:  soft, nontender; no hepatosplenomehaly, BS+; abdominal aorta nontender and not dilated by palpation. Back: no CVA tenderness Pulses 2+ Musculoskeletal: full range of motion, normal strength, no joint deformities Extremities: no clubbing cyanosis  or edema, Homan's sign negative  Neurologic: grossly nonfocal; Cranial nerves grossly wnl Psychologic: Normal mood and affect  ECG (independently read by me): Sinus bradycardia 51 bpm.  Mild LVH.  Old inferior Q waves.  QTc interval 471 ms  March 2019 ECG (independently read by me): sinus bradycardia 50 bpm.  LVH with mild ST changes.  Old inferior infarct.  Prolonged first-degree AV block with a PR interval 2063 seconds.  QTc interval 477 ms.  August 2018 ECG (independently read by me): Sinus bradycardia 56 bpm.  Borderline LVH.  Q waves in lead 3 and aVF.  QTc interval 467 ms  January 2018 ECG (independently read by me): Sinus bradycardia 55 bpm.  Q wave in lead 3 and aVF.  Nonspecific T changes.  January 2017 ECG (independently read by me): Normal sinus rhythm at 61 bpm.  LVH by voltage.  Old inferior MI with inferior Q waves.    January 2016 ECG (independently read by me): Sinus bradycardia 56 bpm.  Old inferior MI with Q waves and early transition, suggesting posterior wall involvement.  Nonspecific ST changes.  January 2015 ECG: Sinus rhythm at 58 beats per minute. Nonspecific T changes. Evidence for old inferior MI with Q waves in leads 3 and aVF.  LABS: BMP Latest Ref Rng & Units 06/18/2017 04/29/2016 04/17/2015  Glucose 65 - 99 mg/dL 103(H) 114(H) 111(H)  BUN 8 - 27 mg/dL 17 14 16   Creatinine 0.76 - 1.27 mg/dL 1.06 0.83 1.00  BUN/Creat Ratio 10 - 24 16 - -  Sodium 134 - 144 mmol/L 143 143 140  Potassium 3.5 - 5.2 mmol/L 4.0 4.2 4.0  Chloride 96 - 106 mmol/L 102 106 105  CO2 20 - 29 mmol/L 25 29 28   Calcium 8.6 - 10.2 mg/dL 9.2 9.0 8.7   Hepatic Function Latest Ref Rng & Units 06/18/2017 04/29/2016 04/17/2015  Total Protein 6.0 - 8.5 g/dL  7.4 6.8 6.3  Albumin 3.5 - 4.8 g/dL 4.1 3.5(L) 3.7  AST 0 - 40 IU/L 21 24 16   ALT 0 - 44 IU/L 22 34 20  Alk Phosphatase 39 - 117 IU/L 54 53 47  Total Bilirubin 0.0 - 1.2 mg/dL 0.9 0.9 0.8   CBC Latest Ref Rng & Units 06/18/2017 04/29/2016 04/17/2015  WBC 3.4 - 10.8 x10E3/uL 7.3 7.3 6.4  Hemoglobin 13.0 - 17.7 g/dL 13.6 13.4 13.5  Hematocrit 37.5 - 51.0 % 41.7 40.7 40.0  Platelets 150 - 379 x10E3/uL 156 196 147(L)   Lab Results  Component Value Date   MCV 93 06/18/2017   MCV 90.8 04/29/2016   MCV 92.2 04/17/2015   No results found for: HGBA1C  Lab Results  Component Value Date   TSH 0.895 06/18/2017   Lipid Panel     Component Value Date/Time   CHOL 141 06/18/2017 1233   TRIG 137 06/18/2017 1233   HDL 44 06/18/2017 1233   CHOLHDL 3.2 06/18/2017 1233   CHOLHDL 4.1 04/29/2016 1213   VLDL 50 (H) 04/29/2016 1213   LDLCALC 70 06/18/2017 1233    RADIOLOGY: No results found.  IMPRESSION:  1. Coronary artery disease involving coronary bypass graft of native heart with angina pectoris [facility]): Marland Kitchen  Currently class I symptoms on multiple medical therapy   2. Essential hypertension   3. Mixed hyperlipidemia   4. Hyperlipidemia with target LDL less than 70   5. Gastroesophageal reflux disease without esophagitis   6. Grade I diastolic dysfunction   7. Hypothyroidism, unspecified type  ASSESSMENT AND PLAN: Ms. Downie is a 79 year old Caucasian male who suffered an  inferior wall myocardial infarction 27 years ago in 1992 with initial PTCA. Marland Kitchen  He underwent CABG surgery in 1993 due to progressive CAD.Marland Kitchen He has documented RCA graft occlusion with good left-to-right collaterals. Since initiating Ranexa, this has made of remarkable difference in his symptomatology.  Presently he is essentially without anginal symptoms.  Over the past 2 months, he did take 1 sublingual nitroglycerin on 2 occasions but has not taken any in numerous months.  He continues to be on be on an anti-ischemic and  blood pressure control medical regimen consisting of amlodipine 5 mg, isosorbide 120 mg, Toprol-XL 125 mg daily, ramipril 10 mg daily, and ranolazine 1000 mg twice a day.  He has a prescription for HCTZ and is taking 12.5 mg only on one occasion over the past month for mild ankle swelling which essentially has resolved.  He has sinus bradycardia on ECG which is stable.  I have discussed with him that if his resting pulse gets below 50 he may want to try decreasing the Toprol-XL to 100 mg.  In the past the dose was increased to 125 mg due to anginal symptomatology which made a difference.  He has a history of hypothyroidism and continues to be on levothyroxine 100 mcg.  He continues to be on rosuvastatin 20 mg and omega-3 fatty acid for hyperlipidemia with target LDL less than 70.  He often does not sleep well due to caring for his very disabled wife who has had multiple strokes and remote history of significant brain cancer.  I am recommending that fasting laboratory be obtained.  I will contact him regarding results and adjustments will be made if necessary.  As long as he remains stable I will see him in 6 months for reevaluation.  Time spent: 25 minutes Troy Sine, MD, [facility]  04/04/2019 6:17 PM

## 2019-04-04 NOTE — Patient Instructions (Signed)
Medication Instructions:  If you start to feel light headed or dizzy you make decrease the Metoprolol (Toprol) to 100 mg once daily  *If you need a refill on your cardiac medications before your next appointment, please call your pharmacy*  Lab Work: None ordered If you have labs (blood work) drawn today and your tests are completely normal, you will receive your results only by: Marland Kitchen MyChart Message (if you have MyChart) OR . A paper copy in the mail If you have any lab test that is abnormal or we need to change your treatment, we will call you to review the results.  Testing/Procedures: None ordered  Follow-Up: At [facility], you and your health needs are our priority.  As part of our continuing mission to provide you with exceptional heart care, we have created designated Provider Care Teams.  These Care Teams include your primary Cardiologist (physician) and Advanced Practice Providers (APPs -  Physician Assistants and Nurse Practitioners) who all work together to provide you with the care you need, when you need it.  Your next appointment:   6 month(s)  The format for your next appointment:   In Person  Provider:   You may see Dr. Claiborne Billings or one of the following Advanced Practice Providers on your designated Care Team:    Almyra Deforest, PA-C  Fabian Sharp, PA-C or   Roby Lofts, [place]

## 2019-04-06 ENCOUNTER — Other Ambulatory Visit (INDEPENDENT_AMBULATORY_CARE_PROVIDER_SITE_OTHER): Payer: PPO

## 2019-04-06 DIAGNOSIS — I25709 Atherosclerosis of coronary artery bypass graft(s), unspecified, with unspecified angina pectoris: Secondary | ICD-10-CM | POA: Diagnosis not present

## 2019-04-06 DIAGNOSIS — E782 Mixed hyperlipidemia: Secondary | ICD-10-CM

## 2019-04-06 DIAGNOSIS — I25700 Atherosclerosis of coronary artery bypass graft(s), unspecified, with unstable angina pectoris: Secondary | ICD-10-CM

## 2019-04-06 DIAGNOSIS — E785 Hyperlipidemia, unspecified: Secondary | ICD-10-CM

## 2019-04-06 DIAGNOSIS — I10 Essential (primary) hypertension: Secondary | ICD-10-CM | POA: Diagnosis not present

## 2019-04-26 ENCOUNTER — Other Ambulatory Visit: Payer: Self-pay | Admitting: Cardiovascular Disease

## 2019-04-26 NOTE — Telephone Encounter (Signed)
Rx request sent to pharmacy.  

## 2019-05-15 ENCOUNTER — Other Ambulatory Visit: Payer: Self-pay | Admitting: Cardiovascular Disease

## 2019-05-25 ENCOUNTER — Other Ambulatory Visit: Payer: Self-pay | Admitting: Cardiovascular Disease

## 2019-05-30 DIAGNOSIS — Z961 Presence of intraocular lens: Secondary | ICD-10-CM | POA: Diagnosis not present

## 2019-05-30 DIAGNOSIS — H5213 Myopia, bilateral: Secondary | ICD-10-CM | POA: Diagnosis not present

## 2019-06-05 ENCOUNTER — Other Ambulatory Visit: Payer: Self-pay

## 2019-06-05 MED ORDER — LEVOTHYROXINE SODIUM 100 MCG PO TABS: 100.0000 ug | ORAL_TABLET | Freq: Every morning | ORAL | 0 refills | Status: DC

## 2019-06-06 ENCOUNTER — Other Ambulatory Visit: Payer: Self-pay | Admitting: Cardiovascular Disease

## 2019-06-09 ENCOUNTER — Emergency Department (HOSPITAL_COMMUNITY)
Admission: EM | Admit: 2019-06-09 | Discharge: 2019-06-09 | Disposition: A | Discharge status: Home / Self Care | Payer: PPO | Attending: Emergency Medicine | Admitting: Emergency Medicine

## 2019-06-09 ENCOUNTER — Other Ambulatory Visit: Payer: Self-pay

## 2019-06-09 ENCOUNTER — Encounter (HOSPITAL_COMMUNITY): Payer: Self-pay | Admitting: Emergency Medicine

## 2019-06-09 ENCOUNTER — Emergency Department (HOSPITAL_COMMUNITY): Payer: PPO

## 2019-06-09 DIAGNOSIS — I2510 Atherosclerotic heart disease of native coronary artery without angina pectoris: Secondary | ICD-10-CM | POA: Insufficient documentation

## 2019-06-09 DIAGNOSIS — I10 Essential (primary) hypertension: Secondary | ICD-10-CM | POA: Insufficient documentation

## 2019-06-09 DIAGNOSIS — W19XXXA Unspecified fall, initial encounter: Secondary | ICD-10-CM | POA: Insufficient documentation

## 2019-06-09 DIAGNOSIS — Z951 Presence of aortocoronary bypass graft: Secondary | ICD-10-CM | POA: Diagnosis not present

## 2019-06-09 DIAGNOSIS — Y999 Unspecified external cause status: Secondary | ICD-10-CM | POA: Insufficient documentation

## 2019-06-09 DIAGNOSIS — Y939 Activity, unspecified: Secondary | ICD-10-CM | POA: Diagnosis not present

## 2019-06-09 DIAGNOSIS — S0101XA Laceration without foreign body of scalp, initial encounter: Secondary | ICD-10-CM | POA: Diagnosis not present

## 2019-06-09 DIAGNOSIS — Z23 Encounter for immunization: Secondary | ICD-10-CM | POA: Diagnosis not present

## 2019-06-09 DIAGNOSIS — E039 Hypothyroidism, unspecified: Secondary | ICD-10-CM | POA: Diagnosis not present

## 2019-06-09 DIAGNOSIS — M542 Cervicalgia: Secondary | ICD-10-CM

## 2019-06-09 DIAGNOSIS — Y929 Unspecified place or not applicable: Secondary | ICD-10-CM | POA: Insufficient documentation

## 2019-06-09 DIAGNOSIS — Z7982 Long term (current) use of aspirin: Secondary | ICD-10-CM | POA: Diagnosis not present

## 2019-06-09 DIAGNOSIS — S0990XA Unspecified injury of head, initial encounter: Secondary | ICD-10-CM | POA: Diagnosis not present

## 2019-06-09 MED ORDER — TETANUS-DIPHTH-ACELL PERTUSSIS 5-2.5-18.5 LF-MCG/0.5 IM SUSP
0.5000 mL | Freq: Once | INTRAMUSCULAR | Status: AC
  Administered 2019-06-09: 21:00:00 0.5 mL via INTRAMUSCULAR
  Filled 2019-06-09: qty 0.5

## 2019-06-09 NOTE — ED Provider Notes (Signed)
[place] EMERGENCY DEPARTMENT Provider Note   CSN: [id number] Arrival date & time: 06/09/19  1755     History Chief Complaint  Patient presents with  . Fall    Bradley Oconnor is a 79 y.o. male.  The history is provided by the patient. No language interpreter was used.  Fall This is a new problem. The current episode started less than 1 hour ago. The problem occurs constantly. The problem has not changed since onset.Nothing aggravates the symptoms. He has tried nothing for the symptoms. The treatment provided no relief.  Pt reports he fell backwards and hit his head.      Past Medical History:  Diagnosis Date  . Blood transfusion without reported diagnosis   . CAD (coronary artery disease)   . GERD (gastroesophageal reflux disease)   . Heart attack ([place])   . Hyperlipidemia   . Hypertension 03/07/12   ECHO-WNL     08/12/11 Lexiscan MyoviewNo significant ischemia demonstrated Low risk scan There is a moderate sized dense scar in the LCX territoy unchanged from the prior study.. Post- stress EF is 40%.  . Thyroid disease     Patient Active Problem List   Diagnosis Date Noted  . Cervicalgia 03/28/2018  . GERD (gastroesophageal reflux disease) 04/10/2013  . HTN (hypertension) 04/10/2013  . CAD (coronary artery disease) of artery bypass graft 10/04/2012  . Hypothyroidism 10/04/2012  . Hyperlipidemia with target LDL less than 70 10/04/2012  . Kidney stones 01/27/2012  . Gallstones-symptomatic 01/27/2012  . Hx of appendectomy-history of ruptured requiring ileocecectomy (~1994) 01/27/2012    Past Surgical History:  Procedure Laterality Date  . APPENDECTOMY    . CERVICAL SPINE SURGERY     titanium plate in the back of neck  . CORONARY ARTERY BYPASS GRAFT    . HERNIA REPAIR    . SMALL INTESTINE SURGERY    . THYROID SURGERY     1/2 thyroid removed on right side       Family History  Problem Relation Age of Onset  . Heart disease Mother   .  Cancer Mother        breast, stomach  . Heart disease Father   . Hyperlipidemia Father   . Heart disease Brother   . Diabetes Brother   . Hyperlipidemia Brother   . Heart disease Paternal Grandfather   . Healthy Brother     Social History   Tobacco Use  . Smoking status: Never Smoker  . Smokeless tobacco: Never Used  Substance Use Topics  . Alcohol use: No    Alcohol/week: 0.0 standard drinks  . Drug use: No    Home Medications Prior to Admission medications   Medication Sig Start Date End Date Taking? Authorizing Provider  amLODipine (NORVASC) 5 MG tablet Take 1 tablet by mouth once daily 06/09/19   Troy Sine, MD  aspirin EC 81 MG tablet Take 1 tablet (81 mg total) by mouth daily. 04/06/16   Troy Sine, MD  fish oil-omega-3 fatty acids 1000 MG capsule Take 1 g by mouth daily.     [provider]  Garlic 123XX123 MG CAPS Take 1 capsule by mouth daily.     [provider]  hydrochlorothiazide (MICROZIDE) 12.5 MG capsule Take 1 capsule (12.5 mg total) by mouth daily as needed (fluid and edema). 07/01/18   Troy Sine, MD  isosorbide mononitrate (IMDUR) 120 MG 24 hr tablet Take 1 tablet by mouth once daily 06/09/19   Claiborne Billings,  Joyice Faster, MD  lansoprazole (PREVACID) 15 MG capsule Take 15 mg by mouth daily.    [provider]  levothyroxine (EUTHYROX) 100 MCG tablet Take 1 tablet (100 mcg total) by mouth every morning. 06/05/19   Troy Sine, MD  levothyroxine (SYNTHROID, LEVOTHROID) 100 MCG tablet TAKE 1 TABLET BY MOUTH IN THE MORNING BEFORE BREAKFAST 05/26/17   Troy Sine, MD  metoprolol succinate (TOPROL-XL) 100 MG 24 hr tablet TAKE 1 & 1/4 (ONE & ONE-FOURTH) TABLETS BY MOUTH ONCE DAILY 02/17/19   Troy Sine, MD  nitroGLYCERIN (NITROSTAT) 0.4 MG SL tablet DISSOLVE ONE TABLET UNDER THE TONGUE EVERY 5 MINUTES AS NEEDED FOR CHEST PAIN.  DO NOT EXCEED A TOTAL OF 3 DOSES IN 15 MINUTES 06/09/19   Troy Sine, MD  NITROSTAT 0.4 MG SL tablet  DISSOLVE ONE TABLET UNDER THE TONGUE EVERY 5 MINUTES AS NEEDED FOR CHEST PAIN.  DO NOT EXCEED A TOTAL OF 3 DOSES IN 15 MINUTES 11/20/16   Troy Sine, MD  ramipril (ALTACE) 10 MG capsule Take 1 capsule by mouth once daily 04/26/19   Troy Sine, MD  ranolazine (RANEXA) 1000 MG SR tablet Take 1 tablet by mouth twice daily 05/15/19   Troy Sine, MD  rosuvastatin (CRESTOR) 20 MG tablet TAKE 1 TABLET BY MOUTH ONCE DAILY IN THE EVENING 02/01/19   Troy Sine, MD    Allergies    Procardia [nifedipine] and Phenergan [promethazine hcl]  Review of Systems   Review of Systems  All other systems reviewed and are negative.   Physical Exam Updated Vital Signs BP 128/79 (BP Location: Left Arm)   Pulse 70   Temp 98 F (36.7 C) (Oral)   Resp 16   Ht 5\' 7"  (1.702 m)   Wt 84.8 kg   SpO2 98%   BMI 29.29 kg/m   Physical Exam Vitals reviewed.  Constitutional:      Appearance: Normal appearance.  HENT:     Head: Normocephalic.     Nose: Nose normal.     Mouth/Throat:     Mouth: Mucous membranes are moist.  Eyes:     Extraocular Movements: Extraocular movements intact.     Pupils: Pupils are equal, round, and reactive to light.  Cardiovascular:     Rate and Rhythm: Normal rate.  Pulmonary:     Effort: Pulmonary effort is normal.  Abdominal:     General: Abdomen is flat.  Musculoskeletal:        General: Normal range of motion.     Cervical back: Normal range of motion.  Skin:    General: Skin is warm.     Comments: 2.5 cm laceration occipital scalp  Neurological:     General: No focal deficit present.     Mental Status: He is alert.  Psychiatric:        Mood and Affect: Mood normal.     ED Results / Procedures / Treatments   Labs (all labs ordered are listed, but only abnormal results are displayed) Labs Reviewed - No data to display  EKG None  Radiology CT Head Wo Contrast  Result Date: 06/09/2019 CLINICAL DATA:  79 year old male with head trauma. EXAM: CT  HEAD WITHOUT CONTRAST TECHNIQUE: Contiguous axial images were obtained from the base of the skull through the vertex without intravenous contrast. COMPARISON:  None. FINDINGS: Brain: There is mild age-related atrophy and chronic microvascular ischemic changes. Age indeterminate, likely old infarct involving the left basal ganglia and  anterior limb of the left internal capsule. There is a focal area of old infarct and encephalomalacia in the left occipital lobe. There is no acute intracranial hemorrhage. Left hemispheric CSF density subdural fluid likely represents an old hygroma. No midline shift. Vascular: No hyperdense vessel or unexpected calcification. Skull: Normal. Negative for fracture or focal lesion. Sinuses/Orbits: No acute finding. Other: None IMPRESSION: 1. No acute intracranial hemorrhage. 2. Age-related atrophy and chronic microvascular ischemic changes. Old left occipital infarct. 3. 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 Electronically Signed   By: Anner Crete M.D.   On: 06/09/2019 19:40   CT Cervical Spine Wo Contrast  Result Date: 06/09/2019 CLINICAL DATA:  Fall striking posterior head. Neck pain. EXAM: CT CERVICAL SPINE WITHOUT CONTRAST TECHNIQUE: Multidetector CT imaging of the cervical spine was performed without intravenous contrast. Multiplanar CT image reconstructions were also generated. COMPARISON:  Cervical radiograph 11 14 FINDINGS: Alignment: Normal. Skull base and vertebrae: Anterior fusion C5 through C7. Intact hardware. No acute fracture. Vertebral body heights are maintained. The dens and skull base are intact. Soft tissues and spinal canal: No prevertebral fluid or swelling. No visible canal hematoma. Disc levels: Anterior fusion C5-C6 and C6-C7 with interbody spacers. Disc space narrowing and endplate spurring at [id number] and C4-C5. Disc bulge at these levels causes some degree of spinal canal narrowing, particularly at C3-C4. Upper chest: No acute findings. Other: Carotid  calcifications. IMPRESSION: Degenerative and postsurgical change in the cervical spine without acute fracture or subluxation. Electronically Signed   By: Keith Rake M.D.   On: 06/09/2019 19:35    Procedures .Marland KitchenLaceration Repair  Date/Time: 06/09/2019 9:05 PM Performed by: Fransico Meadow, PA-C Authorized by: Fransico Meadow, PA-C   Consent:    Consent obtained:  Verbal   Consent given by:  Patient   Risks discussed:  Pain   Alternatives discussed:  No treatment Anesthesia (see MAR for exact dosages):    Anesthesia method:  None Laceration details:    Location:  Scalp   Scalp location:  Occipital   Length (cm):  2   Depth (mm):  3 Repair type:    Repair type:  Intermediate Pre-procedure details:    Preparation:  Patient was prepped and draped in usual sterile fashion Treatment:    Area cleansed with:  Betadine   Amount of cleaning:  Standard   Irrigation solution:  Sterile saline Skin repair:    Repair method:  Staples   Number of staples:  3 Approximation:    Approximation:  Loose Post-procedure details:    Dressing:  Non-adherent dressing and sterile dressing   Patient tolerance of procedure:  Tolerated well, no immediate complications   (including critical care time)  Medications Ordered in ED Medications  Tdap (BOOSTRIX) injection 0.5 mL (has no administration in time range)    ED Course  I have reviewed the triage vital signs and the nursing notes.  Pertinent labs & imaging results that were available during my care of the patient were reviewed by me and considered in my medical decision making (see chart for details).    MDM Rules/Calculators/A&P                      MDM:  Pt denies loss of consciousness.  Ct head and neck no acute abnormality Final Clinical Impression(s) / ED Diagnoses Final diagnoses:  Laceration of scalp, initial encounter  Neck pain  Laceration of occipital region of scalp, initial encounter    Rx / DC Orders ED  Discharge  Orders    None    Staple removal in 7-10 days   Sidney Ace 06/09/19 2106    Quintella Reichert, MD 06/10/19 Laureen Abrahams

## 2019-06-09 NOTE — ED Triage Notes (Signed)
Pt reports cleaning a patio window and stepped back onto the steps that made him fall. Pt landed on back of head. Laceration to back of head. Not on blood thinners. Pain to left shoulder. Denies LOC.

## 2019-06-16 ENCOUNTER — Inpatient Hospital Stay: Payer: PPO | Admitting: Adult Health Nurse Practitioner

## 2019-06-16 ENCOUNTER — Telehealth: Payer: Self-pay | Admitting: Family Medicine

## 2019-06-16 NOTE — Telephone Encounter (Signed)
Pt called to cancel apt for hospital f/u from a 63ft fall today at 11:10 he said he wasn't felling good he said he was going to call back to resch. Pt then stated he passed out. I told pt he should go to the hospital then. Per pt says no he thinks its from him getting out of a hot from his shower. Please advise [phone]

## 2019-06-16 NOTE — Telephone Encounter (Signed)
I agree with ER evaluation as recommended by Guadelupe Sabin.  I discussed this with her in person.

## 2019-06-16 NOTE — Telephone Encounter (Signed)
I have called pt back and informed him that if he doesn't want to come in for his hospital f/u appt today w. Hazle Nordmann, then he needs to go back to the ED for evaluation since he got dizzy and passed out in the shower. I have informed him not to drive and make sure that someone takes him. He stated understanding.   Also FYI to provider

## 2019-06-16 NOTE — Telephone Encounter (Signed)
Already refilled

## 2019-06-21 ENCOUNTER — Other Ambulatory Visit: Payer: Self-pay

## 2019-06-21 ENCOUNTER — Ambulatory Visit (INDEPENDENT_AMBULATORY_CARE_PROVIDER_SITE_OTHER): Payer: PPO | Admitting: Family Medicine

## 2019-06-21 ENCOUNTER — Encounter: Payer: Self-pay | Admitting: Family Medicine

## 2019-06-21 VITALS — BP 108/61 | HR 56 | Temp 98.0°F | Ht 67.0 in | Wt 191.0 lb

## 2019-06-21 DIAGNOSIS — M791 Myalgia, unspecified site: Secondary | ICD-10-CM | POA: Diagnosis not present

## 2019-06-21 DIAGNOSIS — M25512 Pain in left shoulder: Secondary | ICD-10-CM

## 2019-06-21 DIAGNOSIS — M25511 Pain in right shoulder: Secondary | ICD-10-CM | POA: Diagnosis not present

## 2019-06-21 DIAGNOSIS — S0101XA Laceration without foreign body of scalp, initial encounter: Secondary | ICD-10-CM

## 2019-06-21 NOTE — Progress Notes (Signed)
Subjective:  Patient ID: Bradley Oconnor, male    DOB: 10/25/1940  Age: 79 y.o. MRN: [id number]  CC:  Chief Complaint  Patient presents with  . Hospitalization Follow-up    pt fell from a ladder 2 weeks ago. pt went to the ER and had staples put in his head. pt hasn't had any issues with staples pt states he has done his best to keep the area dry and clean.    HPI Bradley Oconnor presents for    Follow-up from fall off ladder, wound to scalp. [place] backwards off a ladder - missed a step. No near-syncope or palpitations, hit his head.  Date of injury 06/09/2019.no loss of consciousness. 2.5 cm laceration on the occipital scalp that was treated with 3 staples in the ER.  CT head and neck without acute abnormality.  CT cervical spine report reviewed, degenerative and postsurgical changes in cervical spine without acute fracture or subluxation.    Plan for staple removal in 7 to 10 days. Telephone call 5 days ago.  Felt dizzy - noted after hot shower. vomiting next day, no fever/cough, no change in taste/smell. No syncope - felt better after vomiting. Thinks was something he ate that night. Having body aches after the fall. Persistent body aches after fall. No loss of use or weakness. 2nd covid vaccine 3/11 - stiffness after that vaccine, but muscle soreness in shoulders, arms, hips after fall.  No injury to shoulders known, some soreness in shoulders before falling, but more sore afterward (fell 1 day after covid vaccine) Overall shoulder pain has been improving.   Tx; tylenol 1000mg  QD.   Lab Results  Component Value Date   ALT 22 06/18/2017   AST 21 06/18/2017   ALKPHOS 54 06/18/2017   BILITOT 0.9 06/18/2017     Some stress with caring for wife at home.  Depression screen [facility] 2/9 06/21/2019 02/10/2018 03/11/2017 02/16/2017 12/31/2016  Decreased Interest 0 0 0 0 0  Down, Depressed, Hopeless 0 0 0 0 0  PHQ - 2 Score 0 0 0 0 0     History Patient Active Problem List   Diagnosis  Date Noted  . Cervicalgia 03/28/2018  . GERD (gastroesophageal reflux disease) 04/10/2013  . HTN (hypertension) 04/10/2013  . CAD (coronary artery disease) of artery bypass graft 10/04/2012  . Hypothyroidism 10/04/2012  . Hyperlipidemia with target LDL less than 70 10/04/2012  . Kidney stones 01/27/2012  . Gallstones-symptomatic 01/27/2012  . Hx of appendectomy-history of ruptured requiring ileocecectomy (~1994) 01/27/2012   Past Medical History:  Diagnosis Date  . Blood transfusion without reported diagnosis   . CAD (coronary artery disease)   . GERD (gastroesophageal reflux disease)   . Heart attack ([place])   . Hyperlipidemia   . Hypertension 03/07/12   ECHO-WNL     08/12/11 Lexiscan MyoviewNo significant ischemia demonstrated Low risk scan There is a moderate sized dense scar in the LCX territoy unchanged from the prior study.. Post- stress EF is 40%.  . Thyroid disease    Past Surgical History:  Procedure Laterality Date  . APPENDECTOMY    . CERVICAL SPINE SURGERY     titanium plate in the back of neck  . CORONARY ARTERY BYPASS GRAFT    . HERNIA REPAIR    . SMALL INTESTINE SURGERY    . 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 Procardia [Nifedipine] Other (See Comments)  Lowers bp   . Phenergan [Promethazine Hcl] Nausea And Vomiting   Prior to Admission medications   Medication Sig Start Date End Date Taking? Authorizing Provider  amLODipine (NORVASC) 5 MG tablet Take 1 tablet by mouth once daily 06/09/19  Yes Troy Sine, MD  aspirin EC 81 MG tablet Take 1 tablet (81 mg total) by mouth daily. 04/06/16  Yes Troy Sine, MD  fish oil-omega-3 fatty acids 1000 MG capsule Take 1 g by mouth daily.    Yes [provider]  Garlic 123XX123 MG CAPS Take 1 capsule by mouth daily.    Yes [provider]  hydrochlorothiazide (MICROZIDE) 12.5 MG capsule Take 1 capsule (12.5 mg total) by mouth daily as needed (fluid  and edema). 07/01/18  Yes Troy Sine, MD  isosorbide mononitrate (IMDUR) 120 MG 24 hr tablet Take 1 tablet by mouth once daily 06/09/19  Yes Troy Sine, MD  lansoprazole (PREVACID) 15 MG capsule Take 15 mg by mouth daily.   Yes [provider]  levothyroxine (EUTHYROX) 100 MCG tablet Take 1 tablet (100 mcg total) by mouth every morning. 06/05/19  Yes Troy Sine, MD  levothyroxine (SYNTHROID, LEVOTHROID) 100 MCG tablet TAKE 1 TABLET BY MOUTH IN THE MORNING BEFORE BREAKFAST 05/26/17  Yes Troy Sine, MD  metoprolol succinate (TOPROL-XL) 100 MG 24 hr tablet TAKE 1 & 1/4 (ONE & ONE-FOURTH) TABLETS BY MOUTH ONCE DAILY 02/17/19  Yes Troy Sine, MD  nitroGLYCERIN (NITROSTAT) 0.4 MG SL tablet DISSOLVE ONE TABLET UNDER THE TONGUE EVERY 5 MINUTES AS NEEDED FOR CHEST PAIN.  DO NOT EXCEED A TOTAL OF 3 DOSES IN 15 MINUTES 06/09/19  Yes Troy Sine, MD  NITROSTAT 0.4 MG SL tablet DISSOLVE ONE TABLET UNDER THE TONGUE EVERY 5 MINUTES AS NEEDED FOR CHEST PAIN.  DO NOT EXCEED A TOTAL OF 3 DOSES IN 15 MINUTES 11/20/16  Yes Troy Sine, MD  ramipril (ALTACE) 10 MG capsule Take 1 capsule by mouth once daily 04/26/19  Yes Troy Sine, MD  ranolazine (RANEXA) 1000 MG SR tablet Take 1 tablet by mouth twice daily 05/15/19  Yes Troy Sine, MD  rosuvastatin (CRESTOR) 20 MG tablet TAKE 1 TABLET BY MOUTH ONCE DAILY IN THE EVENING 02/01/19  Yes Troy Sine, MD   Social History   Socioeconomic History  . Marital status: Married    Spouse name: Not on file  . Number of children: Not on file  . Years of education: 37  . Highest education level: Some college, no degree  Occupational History  . Occupation: Retired  Tobacco Use  . Smoking status: Never Smoker  . Smokeless tobacco: Never Used  Substance and Sexual Activity  . Alcohol use: No    Alcohol/week: 0.0 standard drinks  . Drug use: No  . Sexual activity: Never  Other Topics Concern  . Not on file  Social History  Narrative   Married.    Social Determinants of Health   Financial Resource Strain:   . Difficulty of Paying Living Expenses:   Food Insecurity:   . Worried About Charity fundraiser in the Last Year:   . Arboriculturist in the Last Year:   Transportation Needs:   . Film/video editor (Medical):   Marland Kitchen Lack of Transportation (Non-Medical):   Physical Activity:   . Days of Exercise per Week:   . Minutes of Exercise per Session:   Stress:   . Feeling of  Stress :   Social Connections:   . Frequency of Communication with Friends and Family:   . Frequency of Social Gatherings with Friends and Family:   . Attends Religious Services:   . Active Member of Clubs or Organizations:   . Attends Archivist Meetings:   Marland Kitchen Marital Status:   Intimate Partner Violence:   . Fear of Current or Ex-Partner:   . Emotionally Abused:   Marland Kitchen Physically Abused:   . Sexually Abused:     Review of Systems   Objective:   Vitals:   06/21/19 1618  BP: 108/61  Pulse: (!) 56  Temp: 98 F (36.7 C)  TempSrc: Temporal  SpO2: 97%  Weight: 191 lb (86.6 kg)  Height: 5\' 7"  (1.702 m)     Physical Exam Vitals reviewed.  Constitutional:      General: He is not in acute distress.    Appearance: He is well-developed.  HENT:     Head: Normocephalic and atraumatic.  Cardiovascular:     Rate and Rhythm: Normal rate.  Pulmonary:     Effort: Pulmonary effort is normal.  Musculoskeletal:     Comments: Shoulders: No bony ttp. Clavicle/ac nontender.  Flex, abd 10 approx 100-110.  Negative hawkins, sore with neer, sore with liftoff.  neg empty can.   Min ttp lateral hip at troch bursa bilat pain free IR/ER of hips bilaterally.   Min ttp paraspinals of neck, min ttp midline. Grip strength intact, NVI distally.   Neurological:     Mental Status: He is alert and oriented to person, place, and time.       3 staples removed without difficulty.   Assessment & Plan:  Bradley Oconnor is a  79 y.o. male . Laceration of scalp without foreign body, initial encounter  -Staples removed, wound appears to be healing well as above.  RTC precautions  Bilateral shoulder pain, unspecified chronicity Myalgia, unspecified site  -Various arthralgias as above including lateral hips, upper arms, shoulders, paraspinals of upper back as well as neck.  Denies acute injury to shoulders with fall, and was having some stiffness day prior with the Covid vaccine.  Myalgias may have initially been due to the Covid vaccine, and then possibly myalgias after fall.  No focal bony tenderness.  Discussed imaging of bilateral shoulders, but does report some improvement.  Pain-free hip range of motion.  Imaging deferred.  -Continue Tylenol, symptomatic care, but recheck in the next 5 days if not continue to improve for possible imaging.  Sooner if worse.  No orders of the defined types were placed in this encounter.  Patient Instructions    Jodell Cipro were removed without difficulty, the wound appears to be healing well.  For the shoulder pain, hip pain, leg pain, arm pain -those areas could be due to the previous Covid vaccine or soreness in the muscles with the fall.  Specifically if your shoulders are not improving into this weekend, please follow-up with one of my colleagues so we can perform x-ray and recheck those areas.  Please return sooner or to the emergency room if worsening.    If you have lab work done today you will be contacted with your lab results within the next 2 weeks.  If you have not heard from [place] then please contact us. The fastest way to get your results is to register for My Chart.   IF you received an x-ray today, you will receive an invoice from [facility]. Please contact [facility]  Radiology at [phone] with questions or concerns regarding your invoice.   IF you received labwork today, you will receive an invoice from [facility]. Please contact [facility] at [phone]  with questions or concerns regarding your invoice.   Our billing staff will not be able to assist you with questions regarding bills from these companies.  You will be contacted with the lab results as soon as they are available. The fastest way to get your results is to activate your My Chart account. Instructions are located on the last page of this paperwork. If you have not heard from [place] regarding the results in 2 weeks, please contact this office.         Signed, Merri Ray, MD [facility]

## 2019-06-21 NOTE — Patient Instructions (Addendum)
  Staples were removed without difficulty, the wound appears to be healing well.  For the shoulder pain, hip pain, leg pain, arm pain -those areas could be due to the previous Covid vaccine or soreness in the muscles with the fall.  Specifically if your shoulders are not improving into this weekend, please follow-up with one of my colleagues so we can perform x-ray and recheck those areas.  Please return sooner or to the emergency room if worsening.    If you have lab work done today you will be contacted with your lab results within the next 2 weeks.  If you have not heard from [place] then please contact us. The fastest way to get your results is to register for My Chart.   IF you received an x-ray today, you will receive an invoice from [facility]. Please contact [facility] at [phone] with questions or concerns regarding your invoice.   IF you received labwork today, you will receive an invoice from [facility]. Please contact [facility] at [phone] with questions or concerns regarding your invoice.   Our billing staff will not be able to assist you with questions regarding bills from these companies.  You will be contacted with the lab results as soon as they are available. The fastest way to get your results is to activate your My Chart account. Instructions are located on the last page of this paperwork. If you have not heard from [place] regarding the results in 2 weeks, please contact this office.

## 2019-07-18 DIAGNOSIS — M4722 Other spondylosis with radiculopathy, cervical region: Secondary | ICD-10-CM | POA: Diagnosis not present

## 2019-07-18 DIAGNOSIS — M4726 Other spondylosis with radiculopathy, lumbar region: Secondary | ICD-10-CM | POA: Diagnosis not present

## 2019-07-18 DIAGNOSIS — M9901 Segmental and somatic dysfunction of cervical region: Secondary | ICD-10-CM | POA: Diagnosis not present

## 2019-07-18 DIAGNOSIS — M9903 Segmental and somatic dysfunction of lumbar region: Secondary | ICD-10-CM | POA: Diagnosis not present

## 2019-07-18 DIAGNOSIS — M9902 Segmental and somatic dysfunction of thoracic region: Secondary | ICD-10-CM | POA: Diagnosis not present

## 2019-07-18 DIAGNOSIS — M62830 Muscle spasm of back: Secondary | ICD-10-CM | POA: Diagnosis not present

## 2019-07-19 DIAGNOSIS — M4726 Other spondylosis with radiculopathy, lumbar region: Secondary | ICD-10-CM | POA: Diagnosis not present

## 2019-07-19 DIAGNOSIS — M62830 Muscle spasm of back: Secondary | ICD-10-CM | POA: Diagnosis not present

## 2019-07-19 DIAGNOSIS — M9902 Segmental and somatic dysfunction of thoracic region: Secondary | ICD-10-CM | POA: Diagnosis not present

## 2019-07-19 DIAGNOSIS — M9903 Segmental and somatic dysfunction of lumbar region: Secondary | ICD-10-CM | POA: Diagnosis not present

## 2019-07-19 DIAGNOSIS — M4722 Other spondylosis with radiculopathy, cervical region: Secondary | ICD-10-CM | POA: Diagnosis not present

## 2019-07-19 DIAGNOSIS — M9901 Segmental and somatic dysfunction of cervical region: Secondary | ICD-10-CM | POA: Diagnosis not present

## 2019-07-22 ENCOUNTER — Other Ambulatory Visit: Payer: Self-pay

## 2019-07-22 ENCOUNTER — Encounter (HOSPITAL_COMMUNITY): Payer: Self-pay | Admitting: *Deleted

## 2019-07-22 ENCOUNTER — Emergency Department (HOSPITAL_COMMUNITY): Payer: PPO

## 2019-07-22 ENCOUNTER — Emergency Department (HOSPITAL_COMMUNITY)
Admission: EM | Admit: 2019-07-22 | Discharge: 2019-07-23 | Disposition: A | Discharge status: Home / Self Care | Payer: PPO | Attending: Emergency Medicine | Admitting: Emergency Medicine

## 2019-07-22 DIAGNOSIS — E039 Hypothyroidism, unspecified: Secondary | ICD-10-CM | POA: Insufficient documentation

## 2019-07-22 DIAGNOSIS — W19XXXA Unspecified fall, initial encounter: Secondary | ICD-10-CM

## 2019-07-22 DIAGNOSIS — I2510 Atherosclerotic heart disease of native coronary artery without angina pectoris: Secondary | ICD-10-CM | POA: Diagnosis not present

## 2019-07-22 DIAGNOSIS — Z951 Presence of aortocoronary bypass graft: Secondary | ICD-10-CM | POA: Insufficient documentation

## 2019-07-22 DIAGNOSIS — Y999 Unspecified external cause status: Secondary | ICD-10-CM | POA: Diagnosis not present

## 2019-07-22 DIAGNOSIS — Y92000 Kitchen of unspecified non-institutional (private) residence as  the place of occurrence of the external cause: Secondary | ICD-10-CM | POA: Insufficient documentation

## 2019-07-22 DIAGNOSIS — I10 Essential (primary) hypertension: Secondary | ICD-10-CM | POA: Diagnosis not present

## 2019-07-22 DIAGNOSIS — S0990XA Unspecified injury of head, initial encounter: Secondary | ICD-10-CM | POA: Insufficient documentation

## 2019-07-22 DIAGNOSIS — S0101XA Laceration without foreign body of scalp, initial encounter: Secondary | ICD-10-CM | POA: Diagnosis not present

## 2019-07-22 DIAGNOSIS — W010XXA Fall on same level from slipping, tripping and stumbling without subsequent striking against object, initial encounter: Secondary | ICD-10-CM | POA: Diagnosis not present

## 2019-07-22 DIAGNOSIS — Y93G3 Activity, cooking and baking: Secondary | ICD-10-CM | POA: Insufficient documentation

## 2019-07-22 DIAGNOSIS — Z7982 Long term (current) use of aspirin: Secondary | ICD-10-CM | POA: Diagnosis not present

## 2019-07-22 DIAGNOSIS — Z79899 Other long term (current) drug therapy: Secondary | ICD-10-CM | POA: Diagnosis not present

## 2019-07-22 DIAGNOSIS — S199XXA Unspecified injury of neck, initial encounter: Secondary | ICD-10-CM | POA: Diagnosis not present

## 2019-07-22 DIAGNOSIS — Y92009 Unspecified place in unspecified non-institutional (private) residence as the place of occurrence of the external cause: Secondary | ICD-10-CM

## 2019-07-22 NOTE — ED Triage Notes (Signed)
Pt was cooking dinner when his shoe got caught on the flooring and pt fell backwards onto the floor. Laceration noted to back of head, bleeding controlled at present. No blood thinners

## 2019-07-23 MED ORDER — LIDOCAINE-EPINEPHRINE (PF) 2 %-1:200000 IJ SOLN
10.0000 mL | Freq: Once | INTRAMUSCULAR | Status: AC
  Administered 2019-07-23: 10 mL via INTRADERMAL

## 2019-07-23 NOTE — ED Notes (Signed)
Pt states that he now has a headache. Triage RN [place] made aware, VS updated.

## 2019-07-23 NOTE — ED Provider Notes (Signed)
[facility] EMERGENCY DEPARTMENT Provider Note  CSN: [id number] Arrival date & time: 07/22/19 1942  Chief Complaint(s) Fall and Laceration  HPI Bradley Oconnor is a 79 y.o. male with a past medical history listed below who presents to the emergency department after mechanical fall resulting in occipital head trauma and laceration that occurred approximately 6 to 7 hours prior to arrival.  Patient is not anticoagulated.  He denies any loss of consciousness.  Denied any initial headache.  Now is endorsing mild occipital headache near the area of the laceration.  No neck pain.  No back pain.  No chest pain or shortness of breath.  No extremity pain.  No other physical complaints.  HPI  Past Medical History Past Medical History:  Diagnosis Date  . Blood transfusion without reported diagnosis   . CAD (coronary artery disease)   . GERD (gastroesophageal reflux disease)   . Heart attack ([place])   . Hyperlipidemia   . Hypertension 03/07/12   ECHO-WNL     08/12/11 Lexiscan MyoviewNo significant ischemia demonstrated Low risk scan There is a moderate sized dense scar in the LCX territoy unchanged from the prior study.. Post- stress EF is 40%.  . Thyroid disease    Patient Active Problem List   Diagnosis Date Noted  . Cervicalgia 03/28/2018  . GERD (gastroesophageal reflux disease) 04/10/2013  . HTN (hypertension) 04/10/2013  . CAD (coronary artery disease) of artery bypass graft 10/04/2012  . Hypothyroidism 10/04/2012  . Hyperlipidemia with target LDL less than 70 10/04/2012  . Kidney stones 01/27/2012  . Gallstones-symptomatic 01/27/2012  . Hx of appendectomy-history of ruptured requiring ileocecectomy (~1994) 01/27/2012   Home Medication(s) Prior to Admission medications   Medication Sig Start Date End Date Taking? Authorizing Provider  amLODipine (NORVASC) 5 MG tablet Take 1 tablet by mouth once daily 06/09/19   Troy Sine, MD  aspirin EC 81 MG tablet Take 1  tablet (81 mg total) by mouth daily. 04/06/16   Troy Sine, MD  fish oil-omega-3 fatty acids 1000 MG capsule Take 1 g by mouth daily.     [provider]  Garlic [id number] MG CAPS Take 1 capsule by mouth daily.     [provider]  hydrochlorothiazide (MICROZIDE) 12.5 MG capsule Take 1 capsule (12.5 mg total) by mouth daily as needed (fluid and edema). 07/01/18   Troy Sine, MD  isosorbide mononitrate (IMDUR) 120 MG 24 hr tablet Take 1 tablet by mouth once daily 06/09/19   Troy Sine, MD  lansoprazole (PREVACID) 15 MG capsule Take 15 mg by mouth daily.    [provider]  levothyroxine (EUTHYROX) 100 MCG tablet Take 1 tablet (100 mcg total) by mouth every morning. 06/05/19   Troy Sine, MD  levothyroxine (SYNTHROID, LEVOTHROID) 100 MCG tablet TAKE 1 TABLET BY MOUTH IN THE MORNING BEFORE BREAKFAST 05/26/17   Troy Sine, MD  metoprolol succinate (TOPROL-XL) 100 MG 24 hr tablet TAKE 1 & 1/4 (ONE & ONE-FOURTH) TABLETS BY MOUTH ONCE DAILY 02/17/19   Troy Sine, MD  nitroGLYCERIN (NITROSTAT) 0.4 MG SL tablet DISSOLVE ONE TABLET UNDER THE TONGUE EVERY 5 MINUTES AS NEEDED FOR CHEST PAIN.  DO NOT EXCEED A TOTAL OF 3 DOSES IN 15 MINUTES 06/09/19   Troy Sine, MD  NITROSTAT 0.4 MG SL tablet DISSOLVE ONE TABLET UNDER THE TONGUE EVERY 5 MINUTES AS NEEDED FOR CHEST PAIN.  DO NOT EXCEED A TOTAL OF 3 DOSES IN 15 MINUTES 11/20/16  Troy Sine, MD  ramipril (ALTACE) 10 MG capsule Take 1 capsule by mouth once daily 04/26/19   Troy Sine, MD  ranolazine (RANEXA) 1000 MG SR tablet Take 1 tablet by mouth twice daily 05/15/19   Troy Sine, MD  rosuvastatin (CRESTOR) 20 MG tablet TAKE 1 TABLET BY MOUTH ONCE DAILY IN THE EVENING 02/01/19   Troy Sine, MD                                                                                                                                    Past Surgical History Past Surgical History:  Procedure Laterality Date  .  APPENDECTOMY    . CERVICAL SPINE SURGERY     titanium plate in the back of neck  . CORONARY ARTERY BYPASS GRAFT    . HERNIA REPAIR    . SMALL INTESTINE SURGERY    . THYROID SURGERY     1/2 thyroid removed on right side   Family History Family History  Problem Relation Age of Onset  . Heart disease Mother   . Cancer Mother        breast, stomach  . Heart disease Father   . Hyperlipidemia Father   . Heart disease Brother   . Diabetes Brother   . Hyperlipidemia Brother   . Heart disease Paternal Grandfather   . Healthy Brother     Social History Social History   Tobacco Use  . Smoking status: Never Smoker  . Smokeless tobacco: Never Used  Substance Use Topics  . Alcohol use: No    Alcohol/week: 0.0 standard drinks  . Drug use: No   Allergies Procardia [nifedipine] and Phenergan [promethazine hcl]  Review of Systems Review of Systems All other systems are reviewed and are negative for acute change except as noted in the HPI  Physical Exam Vital Signs  I have reviewed the triage vital signs BP (!) 143/69 (BP Location: Left Arm)   Pulse (!) 57   Temp 97.8 F (36.6 C) (Oral)   Resp 18   Ht 5\' 8"  (1.727 m)   Wt 89.4 kg   SpO2 98%   BMI 29.95 kg/m   Physical Exam Constitutional:      General: He is not in acute distress.    Appearance: He is well-developed. He is not diaphoretic.  HENT:     Head: Normocephalic. Laceration (stellate approx 5-6cm (see image)) present.     Right Ear: External ear normal.     Left Ear: External ear normal.  Eyes:     General: No scleral icterus.       Right eye: No discharge.        Left eye: No discharge.     Conjunctiva/sclera: Conjunctivae normal.     Pupils: Pupils are equal, round, and reactive to light.  Cardiovascular:     Rate and Rhythm: Regular rhythm.     Pulses:  Radial pulses are 2+ on the right side and 2+ on the left side.       Dorsalis pedis pulses are 2+ on the right side and 2+ on the left side.       Heart sounds: Normal heart sounds. No murmur. No friction rub. No gallop.   Pulmonary:     Effort: Pulmonary effort is normal. No respiratory distress.     Breath sounds: Normal breath sounds. No stridor.  Abdominal:     General: There is no distension.     Palpations: Abdomen is soft.     Tenderness: There is no abdominal tenderness.  Musculoskeletal:     Cervical back: Normal range of motion and neck supple. No bony tenderness.     Thoracic back: No bony tenderness.     Lumbar back: No bony tenderness.     Comments: Clavicle stable. Chest stable to AP/Lat compression. Pelvis stable to Lat compression. No obvious extremity deformity. No chest or abdominal wall contusion.  Skin:    General: Skin is warm.  Neurological:     Mental Status: He is alert and oriented to person, place, and time.     GCS: GCS eye subscore is 4. GCS verbal subscore is 5. GCS motor subscore is 6.     Comments: Moving all extremities        ED Results and Treatments Labs (all labs ordered are listed, but only abnormal results are displayed) Labs Reviewed - No data to display                                                                                                                       EKG  EKG Interpretation  Date/Time:    Ventricular Rate:    PR Interval:    QRS Duration:   QT Interval:    QTC Calculation:   R Axis:     Text Interpretation:        Radiology CT Head Wo Contrast  Result Date: 07/22/2019 CLINICAL DATA:  79 year old male with head injury. EXAM: CT HEAD WITHOUT CONTRAST CT CERVICAL SPINE WITHOUT CONTRAST TECHNIQUE: Multidetector CT imaging of the head and cervical spine was performed following the standard protocol without intravenous contrast. Multiplanar CT image reconstructions of the cervical spine were also generated. COMPARISON:  Head CT dated 06/09/2019. FINDINGS: CT HEAD FINDINGS Brain: Mild age-related atrophy and chronic microvascular ischemic changes. Focal  left occipital old infarct and encephalomalacia in there is no acute intracranial hemorrhage. No mass effect or midline shift. Bilateral hemispheric CSF density, left greater right similar to prior CT, likely old hygroma. Vascular: No hyperdense vessel or unexpected calcification. Skull: Normal. Negative for fracture or focal lesion. Sinuses/Orbits: No acute finding. Other: None CT CERVICAL SPINE FINDINGS Alignment: No acute subluxation. Skull base and vertebrae: No acute fracture. Osteopenia. Soft tissues and spinal canal: No prevertebral fluid or swelling. No visible canal hematoma. Disc levels: No acute findings. C5-C7 disc spacer and anterior fusion. The hardware is intact. Upper chest:  Negative. Other: Right carotid bulb calcified plaques. IMPRESSION: 1. No acute intracranial pathology. 2. Mild age-related atrophy and chronic microvascular ischemic changes. 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 3. No acute/traumatic cervical spine pathology. C5-C7 anterior fusion. Electronically Signed   By: Anner Crete M.D.   On: 07/22/2019 22:05   CT Cervical Spine Wo Contrast  Result Date: 07/22/2019 CLINICAL DATA:  79 year old male with head injury. EXAM: CT HEAD WITHOUT CONTRAST CT CERVICAL SPINE WITHOUT CONTRAST TECHNIQUE: Multidetector CT imaging of the head and cervical spine was performed following the standard protocol without intravenous contrast. Multiplanar CT image reconstructions of the cervical spine were also generated. COMPARISON:  Head CT dated 06/09/2019. FINDINGS: CT HEAD FINDINGS Brain: Mild age-related atrophy and chronic microvascular ischemic changes. Focal left occipital old infarct and encephalomalacia in there is no acute intracranial hemorrhage. No mass effect or midline shift. Bilateral hemispheric CSF density, left greater right similar to prior CT, likely old hygroma. Vascular: No hyperdense vessel or unexpected calcification. Skull: Normal. Negative for fracture or focal  lesion. Sinuses/Orbits: No acute finding. Other: None CT CERVICAL SPINE FINDINGS Alignment: No acute subluxation. Skull base and vertebrae: No acute fracture. Osteopenia. Soft tissues and spinal canal: No prevertebral fluid or swelling. No visible canal hematoma. Disc levels: No acute findings. C5-C7 disc spacer and anterior fusion. The hardware is intact. Upper chest: Negative. Other: Right carotid bulb calcified plaques. IMPRESSION: 1. No acute intracranial pathology. 2. Mild age-related atrophy and chronic microvascular ischemic changes. 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 3. No acute/traumatic cervical spine pathology. C5-C7 anterior fusion. Electronically Signed   By: Anner Crete M.D.   On: 07/22/2019 22:05    Pertinent labs & imaging results that were available during my care of the patient were reviewed by me and considered in my medical decision making (see chart for details).  Medications Ordered in ED Medications  lidocaine-EPINEPHrine (XYLOCAINE W/EPI) 2 %-1:200000 (PF) injection 10 mL (10 mLs Intradermal Given 07/23/19 0126)                                                                                                                                    Procedures .Marland KitchenLaceration Repair  Date/Time: 07/23/2019 1:26 AM Performed by: Fatima Blank, MD Authorized by: Fatima Blank, MD   Consent:    Consent obtained:  Verbal   Consent given by:  Patient   Risks discussed:  Pain, poor cosmetic result, poor wound healing and need for additional repair   Alternatives discussed:  Delayed treatment Anesthesia (see MAR for exact dosages):    Anesthesia method:  Local infiltration   Local anesthetic:  Lidocaine 2% WITH epi Laceration details:    Location:  Scalp   Scalp location:  Occipital   Wound length (cm): approx 5-6 cm.   Depth (mm):  4 Repair type:    Repair type:  Simple Pre-procedure details:    Preparation:  Patient was prepped and  draped in  usual sterile fashion and imaging obtained to evaluate for foreign bodies Treatment:    Area cleansed with:  Betadine   Amount of cleaning:  Standard   Irrigation solution:  Sterile saline   Irrigation volume:  1000cc   Irrigation method:  Pressure wash Skin repair:    Repair method:  Staples   Number of staples:  7 Approximation:    Approximation:  Close Post-procedure details:    Dressing:  Antibiotic ointment   Patient tolerance of procedure:  Tolerated well, no immediate complications    (including critical care time)  Medical Decision Making / ED Course I have reviewed the nursing notes for this encounter and the patient's prior records (if available in EHR or on provided paperwork).   Bradley Oconnor was evaluated in Emergency Department on 07/23/2019 for the symptoms described in the history of present illness. He was evaluated in the context of the global COVID-19 pandemic, which necessitated consideration that the patient might be at risk for infection with the SARS-CoV-2 virus that causes COVID-19. Institutional protocols and algorithms that pertain to the evaluation of patients at risk for COVID-19 are in a state of rapid change based on information released by regulatory bodies including the CDC and federal and state organizations. These policies and algorithms were followed during the patient's care in the ED.  Mechanical fall resulting in head trauma and occipital scalp laceration.  CT head and cervical spine negative.  No other injuries noted on exam requiring imaging.  Laceration was thoroughly irrigated and closed as above.      Final Clinical Impression(s) / ED Diagnoses Final diagnoses:  Fall in home, initial encounter  Minor head injury, initial encounter  Laceration of scalp, initial encounter   The patient appears reasonably screened and/or stabilized for discharge and I doubt any other medical condition or other [facility] requiring further screening,  evaluation, or treatment in the ED at this time prior to discharge. Safe for discharge with strict return precautions.  Disposition: Discharge  Condition: Good  I have discussed the results, Dx and Tx plan with the patient/family who expressed understanding and agree(s) with the plan. Discharge instructions discussed at length. The patient/family was given strict return precautions who verbalized understanding of the instructions. No further questions at time of discharge.    ED Discharge Orders    None       Follow Up: [facility] [address] [id number] [place] [phone]  in 5-7 days for staple removal      This chart was dictated using voice recognition software.  Despite best efforts to proofread,  errors can occur which can change the documentation meaning.   Fatima Blank, MD 07/23/19 [phone]

## 2019-07-24 DIAGNOSIS — M9902 Segmental and somatic dysfunction of thoracic region: Secondary | ICD-10-CM | POA: Diagnosis not present

## 2019-07-24 DIAGNOSIS — M62830 Muscle spasm of back: Secondary | ICD-10-CM | POA: Diagnosis not present

## 2019-07-24 DIAGNOSIS — M9903 Segmental and somatic dysfunction of lumbar region: Secondary | ICD-10-CM | POA: Diagnosis not present

## 2019-07-24 DIAGNOSIS — M4726 Other spondylosis with radiculopathy, lumbar region: Secondary | ICD-10-CM | POA: Diagnosis not present

## 2019-07-24 DIAGNOSIS — M9901 Segmental and somatic dysfunction of cervical region: Secondary | ICD-10-CM | POA: Diagnosis not present

## 2019-07-24 DIAGNOSIS — M4722 Other spondylosis with radiculopathy, cervical region: Secondary | ICD-10-CM | POA: Diagnosis not present

## 2019-07-25 DIAGNOSIS — M62830 Muscle spasm of back: Secondary | ICD-10-CM | POA: Diagnosis not present

## 2019-07-25 DIAGNOSIS — M4722 Other spondylosis with radiculopathy, cervical region: Secondary | ICD-10-CM | POA: Diagnosis not present

## 2019-07-25 DIAGNOSIS — M9903 Segmental and somatic dysfunction of lumbar region: Secondary | ICD-10-CM | POA: Diagnosis not present

## 2019-07-25 DIAGNOSIS — M9901 Segmental and somatic dysfunction of cervical region: Secondary | ICD-10-CM | POA: Diagnosis not present

## 2019-07-25 DIAGNOSIS — M4726 Other spondylosis with radiculopathy, lumbar region: Secondary | ICD-10-CM | POA: Diagnosis not present

## 2019-07-25 DIAGNOSIS — M9902 Segmental and somatic dysfunction of thoracic region: Secondary | ICD-10-CM | POA: Diagnosis not present

## 2019-07-26 DIAGNOSIS — M4722 Other spondylosis with radiculopathy, cervical region: Secondary | ICD-10-CM | POA: Diagnosis not present

## 2019-07-26 DIAGNOSIS — M62830 Muscle spasm of back: Secondary | ICD-10-CM | POA: Diagnosis not present

## 2019-07-26 DIAGNOSIS — M9902 Segmental and somatic dysfunction of thoracic region: Secondary | ICD-10-CM | POA: Diagnosis not present

## 2019-07-26 DIAGNOSIS — M4726 Other spondylosis with radiculopathy, lumbar region: Secondary | ICD-10-CM | POA: Diagnosis not present

## 2019-07-26 DIAGNOSIS — M9901 Segmental and somatic dysfunction of cervical region: Secondary | ICD-10-CM | POA: Diagnosis not present

## 2019-07-26 DIAGNOSIS — M9903 Segmental and somatic dysfunction of lumbar region: Secondary | ICD-10-CM | POA: Diagnosis not present

## 2019-07-31 ENCOUNTER — Ambulatory Visit (INDEPENDENT_AMBULATORY_CARE_PROVIDER_SITE_OTHER): Payer: PPO | Admitting: Family Medicine

## 2019-07-31 ENCOUNTER — Ambulatory Visit (INDEPENDENT_AMBULATORY_CARE_PROVIDER_SITE_OTHER): Payer: PPO

## 2019-07-31 ENCOUNTER — Encounter: Payer: Self-pay | Admitting: Family Medicine

## 2019-07-31 ENCOUNTER — Other Ambulatory Visit: Payer: Self-pay

## 2019-07-31 VITALS — BP 126/68 | HR 61 | Temp 97.8°F | Ht 68.0 in | Wt 188.0 lb

## 2019-07-31 DIAGNOSIS — M62830 Muscle spasm of back: Secondary | ICD-10-CM | POA: Diagnosis not present

## 2019-07-31 DIAGNOSIS — M9901 Segmental and somatic dysfunction of cervical region: Secondary | ICD-10-CM | POA: Diagnosis not present

## 2019-07-31 DIAGNOSIS — M25511 Pain in right shoulder: Secondary | ICD-10-CM

## 2019-07-31 DIAGNOSIS — M79622 Pain in left upper arm: Secondary | ICD-10-CM

## 2019-07-31 DIAGNOSIS — S4992XA Unspecified injury of left shoulder and upper arm, initial encounter: Secondary | ICD-10-CM | POA: Diagnosis not present

## 2019-07-31 DIAGNOSIS — S0101XD Laceration without foreign body of scalp, subsequent encounter: Secondary | ICD-10-CM | POA: Diagnosis not present

## 2019-07-31 DIAGNOSIS — M9903 Segmental and somatic dysfunction of lumbar region: Secondary | ICD-10-CM | POA: Diagnosis not present

## 2019-07-31 DIAGNOSIS — M25512 Pain in left shoulder: Secondary | ICD-10-CM

## 2019-07-31 DIAGNOSIS — M9902 Segmental and somatic dysfunction of thoracic region: Secondary | ICD-10-CM | POA: Diagnosis not present

## 2019-07-31 DIAGNOSIS — M4726 Other spondylosis with radiculopathy, lumbar region: Secondary | ICD-10-CM | POA: Diagnosis not present

## 2019-07-31 DIAGNOSIS — S4991XA Unspecified injury of right shoulder and upper arm, initial encounter: Secondary | ICD-10-CM | POA: Diagnosis not present

## 2019-07-31 DIAGNOSIS — M4722 Other spondylosis with radiculopathy, cervical region: Secondary | ICD-10-CM | POA: Diagnosis not present

## 2019-07-31 MED ORDER — PREDNISONE 20 MG PO TABS: 40.0000 mg | ORAL_TABLET | Freq: Every day | ORAL | 0 refills | Status: DC

## 2019-07-31 NOTE — Progress Notes (Signed)
Subjective:  Patient ID: Bradley Oconnor, male    DOB: 1940/11/29  Age: 79 y.o. MRN: [id number]  CC:  Chief Complaint  Patient presents with  . Hospitalization Follow-up    pt was in the hospital on 07/22/2019. pt fell down a ladder and hit his head 3x. pt had 7 staples put in the back opf pts head. pt is hear to get them removed. pt reports no change in vision, headaches , or memory loss. injured area looks clean staples well intacked no sighn of infection.    HPI TACUMA LAUERMAN presents for   Wound on scalp.  DOI 4/24. In kitchen, tripped over brother's foot - hit head on cabinet. Mechanical fall. No LOC. Went to ER and had CT head - no acute intracranial pathology or acute traumatic cervical spine pathology. 7 staples placed. Here for removal.  Treated with polysporin once, scalp sensitive, no pain, no bleeding/pus since staples.   Neck stiffness.  Noticed day after fall. No arm symptoms or neck pain initial day of injury. CT cervical spine as above. Had been in ER for about 13 hrs. Cspine CT on 4/24:  No acute/traumatic cervical spine pathology. C5-C7 anterior fusion in 2010. Prior surgeon retired - Dr. Hal Neer.  Day after injury - stiffneck, pain and numbness down both arms. Sore into muscle. Progressively more stiff. Arms started to feel weak 2 days after fall - worsening. Some dropped objects - both sides. 3rd-4th fingers intermittent numbness.  Tx: biofreeze - neck and arm  - helped initially, less now.    History Patient Active Problem List   Diagnosis Date Noted  . Cervicalgia 03/28/2018  . GERD (gastroesophageal reflux disease) 04/10/2013  . HTN (hypertension) 04/10/2013  . CAD (coronary artery disease) of artery bypass graft 10/04/2012  . Hypothyroidism 10/04/2012  . Hyperlipidemia with target LDL less than 70 10/04/2012  . Kidney stones 01/27/2012  . Gallstones-symptomatic 01/27/2012  . Hx of appendectomy-history of ruptured requiring ileocecectomy (~1994)  01/27/2012   Past Medical History:  Diagnosis Date  . Blood transfusion without reported diagnosis   . CAD (coronary artery disease)   . GERD (gastroesophageal reflux disease)   . Heart attack ([place])   . Hyperlipidemia   . Hypertension 03/07/12   ECHO-WNL     08/12/11 Lexiscan MyoviewNo significant ischemia demonstrated Low risk scan There is a moderate sized dense scar in the LCX territoy unchanged from the prior study.. Post- stress EF is 40%.  . Thyroid disease    Past Surgical History:  Procedure Laterality Date  . APPENDECTOMY    . CERVICAL SPINE SURGERY     titanium plate in the back of neck  . CORONARY ARTERY BYPASS GRAFT    . HERNIA REPAIR    . SMALL INTESTINE SURGERY    . 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 Procardia [Nifedipine] Other (See Comments)    Lowers bp   . Phenergan [Promethazine Hcl] Nausea And Vomiting   Prior to Admission medications   Medication Sig Start Date End Date Taking? Authorizing Provider  amLODipine (NORVASC) 5 MG tablet Take 1 tablet by mouth once daily 06/09/19  Yes Troy Sine, MD  aspirin EC 81 MG tablet Take 1 tablet (81 mg total) by mouth daily. 04/06/16  Yes Troy Sine, MD  fish oil-omega-3 fatty acids 1000 MG capsule Take 1 g by mouth daily.    Yes [provider]  Garlic 123XX123 MG CAPS Take 1 capsule by mouth daily.    Yes [provider]  hydrochlorothiazide (MICROZIDE) 12.5 MG capsule Take 1 capsule (12.5 mg total) by mouth daily as needed (fluid and edema). 07/01/18  Yes Troy Sine, MD  isosorbide mononitrate (IMDUR) 120 MG 24 hr tablet Take 1 tablet by mouth once daily 06/09/19  Yes Troy Sine, MD  lansoprazole (PREVACID) 15 MG capsule Take 15 mg by mouth daily.   Yes [provider]  levothyroxine (EUTHYROX) 100 MCG tablet Take 1 tablet (100 mcg total) by mouth every morning. 06/05/19  Yes Troy Sine, MD  levothyroxine (SYNTHROID,  LEVOTHROID) 100 MCG tablet TAKE 1 TABLET BY MOUTH IN THE MORNING BEFORE BREAKFAST 05/26/17  Yes Troy Sine, MD  metoprolol succinate (TOPROL-XL) 100 MG 24 hr tablet TAKE 1 & 1/4 (ONE & ONE-FOURTH) TABLETS BY MOUTH ONCE DAILY 02/17/19  Yes Troy Sine, MD  nitroGLYCERIN (NITROSTAT) 0.4 MG SL tablet DISSOLVE ONE TABLET UNDER THE TONGUE EVERY 5 MINUTES AS NEEDED FOR CHEST PAIN.  DO NOT EXCEED A TOTAL OF 3 DOSES IN 15 MINUTES 06/09/19  Yes Troy Sine, MD  NITROSTAT 0.4 MG SL tablet DISSOLVE ONE TABLET UNDER THE TONGUE EVERY 5 MINUTES AS NEEDED FOR CHEST PAIN.  DO NOT EXCEED A TOTAL OF 3 DOSES IN 15 MINUTES 11/20/16  Yes Troy Sine, MD  ramipril (ALTACE) 10 MG capsule Take 1 capsule by mouth once daily 04/26/19  Yes Troy Sine, MD  ranolazine (RANEXA) 1000 MG SR tablet Take 1 tablet by mouth twice daily 05/15/19  Yes Troy Sine, MD  rosuvastatin (CRESTOR) 20 MG tablet TAKE 1 TABLET BY MOUTH ONCE DAILY IN THE EVENING 02/01/19  Yes Troy Sine, MD   Social History   Socioeconomic History  . Marital status: Married    Spouse name: Not on file  . Number of children: Not on file  . Years of education: 69  . Highest education level: Some college, no degree  Occupational History  . Occupation: Retired  Tobacco Use  . Smoking status: Never Smoker  . Smokeless tobacco: Never Used  Substance and Sexual Activity  . Alcohol use: No    Alcohol/week: 0.0 standard drinks  . Drug use: No  . Sexual activity: Never  Other Topics Concern  . Not on file  Social History Narrative   Married.    Social Determinants of Health   Financial Resource Strain:   . Difficulty of Paying Living Expenses:   Food Insecurity:   . Worried About Charity fundraiser in the Last Year:   . Arboriculturist in the Last Year:   Transportation Needs:   . Film/video editor (Medical):   Marland Kitchen Lack of Transportation (Non-Medical):   Physical Activity:   . Days of Exercise per Week:   . Minutes of  Exercise per Session:   Stress:   . Feeling of Stress :   Social Connections:   . Frequency of Communication with Friends and Family:   . Frequency of Social Gatherings with Friends and Family:   . Attends Religious Services:   . Active Member of Clubs or Organizations:   . Attends Archivist Meetings:   Marland Kitchen Marital Status:   Intimate Partner Violence:   . Fear of Current or Ex-Partner:   . Emotionally Abused:   Marland Kitchen Physically Abused:   . Sexually Abused:     Review of Systems  Objective:   Vitals:   07/31/19 1626  BP: 126/68  Pulse: 61  Temp: 97.8 F (36.6 C)  TempSrc: Temporal  SpO2: 95%  Weight: 188 lb (85.3 kg)  Height: 5\' 8"  (1.727 m)     Physical Exam Vitals reviewed.  Constitutional:      General: He is not in acute distress.    Appearance: He is well-developed.  HENT:     Head: Normocephalic and atraumatic.  Cardiovascular:     Rate and Rhythm: Normal rate.  Pulmonary:     Effort: Pulmonary effort is normal.  Musculoskeletal:     Comments: C-spine, lower tenderness including the paraspinals with some spasm. Right shoulder  guarded with flexion at 70 degrees, abduction 70 degrees.  Some decreased slight tenderness anterior shoulder without focal bony tenderness  Left shoulder Guarded range of motion with 70 degrees abduction, 70 degrees flexion, slight tenderness anteriorly.  Some soft tissue, diffuse tenderness of the left upper humerus without focal bony tenderness.  Skin:    Comments: Posterior scalp with #7 intact staples, wound healing without widening with tension, no complications with staple removal.  Aftercare discussed.  Neurological:     Mental Status: He is alert and oriented to person, place, and time.  Psychiatric:        Mood and Affect: Mood normal.     DG Shoulder Right  Result Date: 07/31/2019 CLINICAL DATA:  Shoulder pain.  Fall. EXAM: RIGHT SHOULDER - 2+ VIEW COMPARISON:  None. FINDINGS: There is no evidence of fracture  or dislocation. There is no evidence of arthropathy or other focal bone abnormality. Soft tissues are unremarkable. IMPRESSION: Negative. Electronically Signed   By: Misty Stanley M.D.   On: 07/31/2019 17:46   CT Head Wo Contrast  Result Date: 07/22/2019 CLINICAL DATA:  79 year old male with head injury. EXAM: CT HEAD WITHOUT CONTRAST CT CERVICAL SPINE WITHOUT CONTRAST TECHNIQUE: Multidetector CT imaging of the head and cervical spine was performed following the standard protocol without intravenous contrast. Multiplanar CT image reconstructions of the cervical spine were also generated. COMPARISON:  Head CT dated 06/09/2019. FINDINGS: CT HEAD FINDINGS Brain: Mild age-related atrophy and chronic microvascular ischemic changes. Focal left occipital old infarct and encephalomalacia in there is no acute intracranial hemorrhage. No mass effect or midline shift. Bilateral hemispheric CSF density, left greater right similar to prior CT, likely old hygroma. Vascular: No hyperdense vessel or unexpected calcification. Skull: Normal. Negative for fracture or focal lesion. Sinuses/Orbits: No acute finding. Other: None CT CERVICAL SPINE FINDINGS Alignment: No acute subluxation. Skull base and vertebrae: No acute fracture. Osteopenia. Soft tissues and spinal canal: No prevertebral fluid or swelling. No visible canal hematoma. Disc levels: No acute findings. C5-C7 disc spacer and anterior fusion. The hardware is intact. Upper chest: Negative. Other: Right carotid bulb calcified plaques. IMPRESSION: 1. No acute intracranial pathology. 2. Mild age-related atrophy and chronic microvascular ischemic changes. 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 3. No acute/traumatic cervical spine pathology. C5-C7 anterior fusion. Electronically Signed   By: Anner Crete M.D.   On: 07/22/2019 22:05   CT Cervical Spine Wo Contrast  Result Date: 07/22/2019 CLINICAL DATA:  79 year old male with head injury. EXAM: CT HEAD WITHOUT  CONTRAST CT CERVICAL SPINE WITHOUT CONTRAST TECHNIQUE: Multidetector CT imaging of the head and cervical spine was performed following the standard protocol without intravenous contrast. Multiplanar CT image reconstructions of the cervical spine were also generated. COMPARISON:  Head CT dated 06/09/2019. FINDINGS: CT HEAD FINDINGS Brain: Mild age-related atrophy  and chronic microvascular ischemic changes. Focal left occipital old infarct and encephalomalacia in there is no acute intracranial hemorrhage. No mass effect or midline shift. Bilateral hemispheric CSF density, left greater right similar to prior CT, likely old hygroma. Vascular: No hyperdense vessel or unexpected calcification. Skull: Normal. Negative for fracture or focal lesion. Sinuses/Orbits: No acute finding. Other: None CT CERVICAL SPINE FINDINGS Alignment: No acute subluxation. Skull base and vertebrae: No acute fracture. Osteopenia. Soft tissues and spinal canal: No prevertebral fluid or swelling. No visible canal hematoma. Disc levels: No acute findings. C5-C7 disc spacer and anterior fusion. The hardware is intact. Upper chest: Negative. Other: Right carotid bulb calcified plaques. IMPRESSION: 1. No acute intracranial pathology. 2. Mild age-related atrophy and chronic microvascular ischemic changes. 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 3. No acute/traumatic cervical spine pathology. C5-C7 anterior fusion. Electronically Signed   By: Anner Crete M.D.   On: 07/22/2019 22:05   DG Shoulder Left  Result Date: 07/31/2019 CLINICAL DATA:  Fall.  Pain. EXAM: LEFT SHOULDER - 2+ VIEW COMPARISON:  12/22/2016 FINDINGS: There is no evidence of fracture or dislocation. There is no evidence of arthropathy or other focal bone abnormality. Soft tissues are unremarkable. IMPRESSION: Negative. Electronically Signed   By: Misty Stanley M.D.   On: 07/31/2019 17:46   DG Humerus Left  Result Date: 07/31/2019 CLINICAL DATA:  Fall.  Pain. EXAM: LEFT  HUMERUS - 2+ VIEW COMPARISON:  None. FINDINGS: There is no evidence of fracture or other focal bone lesions. Soft tissues are unremarkable. IMPRESSION: Negative. Electronically Signed   By: Misty Stanley M.D.   On: 07/31/2019 17:45   Assessment & Plan:  ABDUL SHIVER is a 79 y.o. male . Acute pain of both shoulders - Plan: DG Shoulder Left, DG Shoulder Right, predniSONE (DELTASONE) 20 MG tablet Left upper arm pain - Plan: DG Humerus Left, predniSONE (DELTASONE) 20 MG tablet  -Noted day after fall.  Possible underlying cervical spine disease with secondary spasm, cervical radicular symptoms without true weakness noted on exam.  Guarded exam of shoulder range of motion, potential rotator cuff tendinosis but bilateral symptoms noted after fall.  -We will try initial prednisone 40 mg daily x5 days, potential side effects and risks were discussed.  -Refer to orthopedic surgery.  ER/RTC precautions  Laceration of scalp without foreign body, subsequent encounter  -#7 Staples intact, removed without difficulty,    No orders of the defined types were placed in this encounter.  Patient Instructions       If you have lab work done today you will be contacted with your lab results within the next 2 weeks.  If you have not heard from [place] then please contact us. The fastest way to get your results is to register for My Chart.   IF you received an x-ray today, you will receive an invoice from [facility]. Please contact [facility] at [phone] with questions or concerns regarding your invoice.   IF you received labwork today, you will receive an invoice from [facility]. Please contact [facility] at [phone] with questions or concerns regarding your invoice.   Our billing staff will not be able to assist you with questions regarding bills from these companies.  You will be contacted with the lab results as soon as they are available. The fastest way to get your results is to  activate your My Chart account. Instructions are located on the last page of this paperwork. If you have not heard from [place] regarding the  results in 2 weeks, please contact this office.         Signed, Merri Ray, MD [facility]

## 2019-07-31 NOTE — Patient Instructions (Addendum)
  I will refer you to orthopaedics for your shoulder, but prednisone for now may help. Heat or ice to spasm, tylenol if needed.  Return to the clinic or go to the nearest emergency room if any of your symptoms worsen or new symptoms occur.  Gently clean wound, but no concerns on exam today after staples removed.    If you have lab work done today you will be contacted with your lab results within the next 2 weeks.  If you have not heard from [place] then please contact us. The fastest way to get your results is to register for My Chart.   IF you received an x-ray today, you will receive an invoice from [facility]. Please contact [facility] at [phone] with questions or concerns regarding your invoice.   IF you received labwork today, you will receive an invoice from [place]. Please contact [facility] at [phone] with questions or concerns regarding your invoice.   Our billing staff will not be able to assist you with questions regarding bills from these companies.  You will be contacted with the lab results as soon as they are available. The fastest way to get your results is to activate your My Chart account. Instructions are located on the last page of this paperwork. If you have not heard from [place] regarding the results in 2 weeks, please contact this office.

## 2019-08-01 ENCOUNTER — Encounter: Payer: Self-pay | Admitting: Family Medicine

## 2019-08-01 ENCOUNTER — Other Ambulatory Visit: Payer: Self-pay | Admitting: Cardiovascular Disease

## 2019-08-03 ENCOUNTER — Encounter: Payer: Self-pay | Admitting: Orthopaedic Surgery

## 2019-08-03 ENCOUNTER — Ambulatory Visit: Payer: PPO | Admitting: Orthopaedic Surgery

## 2019-08-03 ENCOUNTER — Other Ambulatory Visit: Payer: Self-pay

## 2019-08-03 VITALS — Ht 68.0 in | Wt 188.0 lb

## 2019-08-03 DIAGNOSIS — G8929 Other chronic pain: Secondary | ICD-10-CM | POA: Diagnosis not present

## 2019-08-03 DIAGNOSIS — M25512 Pain in left shoulder: Secondary | ICD-10-CM | POA: Diagnosis not present

## 2019-08-03 DIAGNOSIS — M25511 Pain in right shoulder: Secondary | ICD-10-CM | POA: Insufficient documentation

## 2019-08-03 DIAGNOSIS — M542 Cervicalgia: Secondary | ICD-10-CM

## 2019-08-03 NOTE — Progress Notes (Signed)
Office Visit Note   Patient: Bradley Oconnor           Date of Birth: [birth year]           MRN: [id number] Visit Date: 08/03/2019              Requested by: Wendie Agreste, MD [address] [place],  [place] [address] PCP: Wendie Agreste, MD   Assessment & Plan: Visit Diagnoses:  1. Chronic right shoulder pain   2. Chronic left shoulder pain   3. Chronic pain of both shoulders   4. Cervicalgia     Plan: Mr. 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  He has had several falls over the last several months that contribute to his present symptoms.  He is having more trouble with his right than his left shoulder with positive impingement.  Going to order an MRI scan given the chronicity of his problem and lack of function.  He also has been experiencing recurrent episodes of numbness and tingling in the both upper extremities and I am not sure if it is related to his neck or possibly carpal tunnel syndrome.  Will order EMGs and nerve conduction studies to both upper extremities.  I am not sure if the numbness and tingling is referred from the cervical spine or some peripheral neuropathy i.e. carpal tunnel.  Follow-Up Instructions: Return After MRI scan right shoulder and bilateral EMGs and nerve conduction studies of upper extremities.   Orders:  Orders Placed This Encounter  Procedures  . MR SHOULDER RIGHT WO CONTRAST  . Ambulatory referral to Physical Medicine Rehab   No orders of the defined types were placed in this encounter.     Procedures: No procedures performed   Clinical Data: No additional findings.   Subjective: Chief Complaint  Patient presents with  . Right Shoulder - Pain  . Left Shoulder - Pain  Patient presents today for bilateral shoulder pain. He fell from a ladder on 06/09/2019 and landed on his back. He fell again on 07/22/2019. His pain is located anteriorly in both shoulders. He has  limited range of motion bilaterally. He has bilateral arm weakness. He has numbness in his right hand that keeps him from being able to grip anything. He is taking tylenol for pain. His PCP gave him prednisone Monday and states that it has helped a lot. Prior to today he has had a CT scan of his C-Spine and x-rays of both shoulders. He relates feeling about 40% better when he started the prednisone within the last several days.  He still having episodes of numbness and tingling in both of his arms even at rest.  CT scan of the cervical spine and head were performed on April 24.  No acute intracranial pathology.  Mild age-related atrophy and chronic microvascular ischemic changes.  Old left occipital infarct.  No traumatic cervical spine pathology.  Had prior C5 C7 anterior fusion.  There is disc space narrowing and endplate spurring at 075-GRM and C4-5 with a prior fusion at C5-6 and C6-7.  Disc bulge cause some level of spinal canal narrowing particularly at C3-4            HPI  Review of Systems  Constitutional: Negative for fatigue.  HENT: Negative for ear pain.   Eyes: Negative for pain.  Respiratory: Negative for shortness of breath.   Cardiovascular: Negative for leg swelling.  Gastrointestinal: Negative for constipation and  diarrhea.  Endocrine: Negative for cold intolerance and heat intolerance.  Genitourinary: Negative for difficulty urinating.  Musculoskeletal: Negative for joint swelling.  Skin: Negative for rash.  Allergic/Immunologic: Negative for food allergies.  Neurological: Positive for weakness.  Hematological: Does not bruise/bleed easily.  Psychiatric/Behavioral: Negative for sleep disturbance.     Objective: Vital Signs: Ht 5\' 8"  (1.727 m)   Wt 188 lb (85.3 kg)   BMI 28.59 kg/m   Physical Exam Constitutional:      Appearance: He is well-developed.  Eyes:     Pupils: Pupils are equal, round, and reactive to light.  Pulmonary:     Effort: Pulmonary effort  is normal.  Skin:    General: Skin is warm and dry.  Neurological:     Mental Status: He is alert and oriented to person, place, and time.  Psychiatric:        Behavior: Behavior normal.     Ortho Exam awake alert and oriented x3 comfortable sitting in no acute distress.  Able to touch his chin to his chest and had about 70 to 80 degrees of normal neck rotation.  Only able to extend his neck about 30 degrees but no referred pain to either upper extremity.  He had good grip and good release of both hands with good capillary refill.  Negative Tinel's over the median nerve.  Examination of both shoulder he had more impingement on the right than he did on the left with a positive Speed sign on the right compared to the left.  He can abduct about 80 degrees actively and about 90 degrees passively.  He lacked about 30 degrees of full overhead motion bilaterally of the shoulders.  Skin intact.  Specialty Comments:  No specialty comments available.  Imaging: No results found.   PMFS History: Patient Active Problem List   Diagnosis Date Noted  . Shoulder pain, bilateral 08/03/2019  . Cervicalgia 03/28/2018  . GERD (gastroesophageal reflux disease) 04/10/2013  . HTN (hypertension) 04/10/2013  . CAD (coronary artery disease) of artery bypass graft 10/04/2012  . Hypothyroidism 10/04/2012  . Hyperlipidemia with target LDL less than 70 10/04/2012  . Kidney stones 01/27/2012  . Gallstones-symptomatic 01/27/2012  . Hx of appendectomy-history of ruptured requiring ileocecectomy (~1994) 01/27/2012   Past Medical History:  Diagnosis Date  . Blood transfusion without reported diagnosis   . CAD (coronary artery disease)   . GERD (gastroesophageal reflux disease)   . Heart attack ([place])   . Hyperlipidemia   . Hypertension 03/07/12   ECHO-WNL     08/12/11 Lexiscan MyoviewNo significant ischemia demonstrated Low risk scan There is a moderate sized dense scar in the LCX territoy unchanged from the  prior study.. Post- stress EF is 40%.  . Thyroid disease     Family History  Problem Relation Age of Onset  . Heart disease Mother   . Cancer Mother        breast, stomach  . Heart disease Father   . Hyperlipidemia Father   . Heart disease Brother   . Diabetes Brother   . Hyperlipidemia Brother   . Heart disease Paternal Grandfather   . Healthy Brother     Past Surgical History:  Procedure Laterality Date  . APPENDECTOMY    . CERVICAL SPINE SURGERY     titanium plate in the back of neck  . CORONARY ARTERY BYPASS GRAFT    . HERNIA REPAIR    . SMALL INTESTINE SURGERY    . THYROID SURGERY  1/2 thyroid removed on right side   Social History   Occupational History  . Occupation: Retired  Tobacco Use  . Smoking status: Never Smoker  . Smokeless tobacco: Never Used  Substance and Sexual Activity  . Alcohol use: No    Alcohol/week: 0.0 standard drinks  . Drug use: No  . Sexual activity: Never

## 2019-08-14 ENCOUNTER — Encounter: Payer: Self-pay | Admitting: Family Medicine

## 2019-08-14 ENCOUNTER — Telehealth: Payer: Self-pay | Admitting: Family Medicine

## 2019-08-14 ENCOUNTER — Ambulatory Visit (INDEPENDENT_AMBULATORY_CARE_PROVIDER_SITE_OTHER): Payer: PPO | Admitting: Family Medicine

## 2019-08-14 ENCOUNTER — Other Ambulatory Visit: Payer: Self-pay

## 2019-08-14 VITALS — BP 119/65 | HR 61 | Temp 98.1°F | Resp 14 | Ht 68.0 in | Wt 185.0 lb

## 2019-08-14 DIAGNOSIS — M25511 Pain in right shoulder: Secondary | ICD-10-CM

## 2019-08-14 DIAGNOSIS — R200 Anesthesia of skin: Secondary | ICD-10-CM

## 2019-08-14 DIAGNOSIS — R739 Hyperglycemia, unspecified: Secondary | ICD-10-CM

## 2019-08-14 DIAGNOSIS — M25512 Pain in left shoulder: Secondary | ICD-10-CM | POA: Diagnosis not present

## 2019-08-14 LAB — POCT GLYCOSYLATED HEMOGLOBIN (HGB A1C): Hemoglobin A1C: 6.1 % — AB (ref 4.0–5.6)

## 2019-08-14 LAB — GLUCOSE, POCT (MANUAL RESULT ENTRY): POC Glucose: 115 mg/dl — AB (ref 70–99)

## 2019-08-14 MED ORDER — PREDNISONE 20 MG PO TABS: ORAL_TABLET | ORAL | 0 refills | Status: DC

## 2019-08-14 MED ORDER — TRAMADOL HCL 50 MG PO TABS: 50.0000 mg | ORAL_TABLET | Freq: Three times a day (TID) | ORAL | 0 refills | Status: AC | PRN

## 2019-08-14 NOTE — Telephone Encounter (Signed)
Please advise patient that his blood sugar was a few points elevated in the office, 44-month average was in the prediabetes range.  Watch diet, activity as tolerated and recheck levels in the next 3 months.  If any increased thirst, frequent urination, blurry vision, nausea or vomiting with use of prednisone, be seen right away in the emergency room but I do not expect those symptoms to occur.

## 2019-08-14 NOTE — Patient Instructions (Addendum)
We can try one more taper of prednisone, but if pain returns to same level or return of hand weakness, I recommend following up with ortho sooner. Continue tylenol - up to 4 times per day, OR tramadol if needed for more severe pain. Be careful with possible sedation/unsteadiness if you take tramadol.   Return to the clinic or go to the nearest emergency room if any of your symptoms worsen or new symptoms occur.   If you have lab work done today you will be contacted with your lab results within the next 2 weeks.  If you have not heard from [place] then please contact us. The fastest way to get your results is to register for My Chart.   IF you received an x-ray today, you will receive an invoice from [facility]. Please contact [facility] at [phone] with questions or concerns regarding your invoice.   IF you received labwork today, you will receive an invoice from [place]. Please contact [facility] at [phone] with questions or concerns regarding your invoice.   Our billing staff will not be able to assist you with questions regarding bills from these companies.  You will be contacted with the lab results as soon as they are available. The fastest way to get your results is to activate your My Chart account. Instructions are located on the last page of this paperwork. If you have not heard from [place] regarding the results in 2 weeks, please contact this office.

## 2019-08-14 NOTE — Progress Notes (Signed)
Subjective:  Patient ID: Bradley Oconnor, male    DOB: 07/28/1940  Age: 79 y.o. MRN: [id number]  CC:  Chief Complaint  Patient presents with  . Shoulder Pain    pt had a fall in April and is still having pain in his hands and his Rt shoulder, hands are numb and tinggling, pt describes pain as aching for the most part.     HPI [name] presents for  R shoulder pain: Last discussed with me May 3.  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  Also concern for possible rotator cuff tendinosis with bilateral shoulder symptoms.  Treated with prednisone 40 mg daily x5 days, referred to orthopedics. Note reviewed from orthopedics, Dr. Durward Fortes on May 6.  Decreased motion consistent with adhesive capsulitis, MRI obtained of the right shoulder as it has been more of an issue than left.  EMGs and nerve conduction studies were also ordered for the numbness and tingling in his upper extremities to evaluate for carpal tunnel versus cervical cause.  MRI and physical medicine and rehab appointments in June.  Prednisone did help temporarily. Pain returned 2 days later. Tingling in hand and numbness in hands improved until a day or two later.  Having a hard time getting shirt on. Difficulty with activities at home - wife with significant home care needs. Tx: tylenol 1000mg  BID. Helps some. Trouble sleeping due to pain.   Same pain in R>L shoulders. Same arm symptoms.  No history of seizures, tolerated ultram in 2018.  Glucose 103 in 2019 - pended labs from cardiology last year.   History Patient Active Problem List   Diagnosis Date Noted  . Shoulder pain, bilateral 08/03/2019  . Cervicalgia 03/28/2018  . GERD (gastroesophageal reflux disease) 04/10/2013  . HTN (hypertension) 04/10/2013  . CAD (coronary artery disease) of artery bypass graft 10/04/2012  . Hypothyroidism 10/04/2012  . Hyperlipidemia with target LDL less  than 70 10/04/2012  . Kidney stones 01/27/2012  . Gallstones-symptomatic 01/27/2012  . Hx of appendectomy-history of ruptured requiring ileocecectomy (~1994) 01/27/2012   Past Medical History:  Diagnosis Date  . Blood transfusion without reported diagnosis   . CAD (coronary artery disease)   . GERD (gastroesophageal reflux disease)   . Heart attack ([place])   . Hyperlipidemia   . Hypertension 03/07/12   ECHO-WNL     08/12/11 Lexiscan MyoviewNo significant ischemia demonstrated Low risk scan There is a moderate sized dense scar in the LCX territoy unchanged from the prior study.. Post- stress EF is 40%.  . Thyroid disease    Past Surgical History:  Procedure Laterality Date  . APPENDECTOMY    . CERVICAL SPINE SURGERY     titanium plate in the back of neck  . CORONARY ARTERY BYPASS GRAFT    . HERNIA REPAIR    . SMALL INTESTINE SURGERY    . 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 Procardia [Nifedipine] Other (See Comments)    Lowers bp   . Phenergan [Promethazine Hcl] Nausea And Vomiting   Prior to Admission medications   Medication Sig Start Date End Date Taking? Authorizing Provider  amLODipine (NORVASC) 5 MG tablet Take 1 tablet by mouth once daily 06/09/19  Yes Troy Sine, MD  aspirin EC 81 MG tablet Take 1 tablet (81 mg total) by mouth daily. 04/06/16  Yes Troy Sine, MD  fish oil-omega-3 fatty acids 1000 MG capsule Take 1 g by mouth daily.    Yes [provider]  Garlic [id number] MG CAPS Take 1 capsule by mouth daily.    Yes [provider]  hydrochlorothiazide (MICROZIDE) 12.5 MG capsule Take 1 capsule (12.5 mg total) by mouth daily as needed (fluid and edema). 07/01/18  Yes Troy Sine, MD  isosorbide mononitrate (IMDUR) 120 MG 24 hr tablet Take 1 tablet by mouth once daily 06/09/19  Yes Troy Sine, MD  lansoprazole (PREVACID) 15 MG capsule Take 15 mg by mouth daily.   Yes [provider]    levothyroxine (EUTHYROX) 100 MCG tablet Take 1 tablet (100 mcg total) by mouth every morning. 06/05/19  Yes Troy Sine, MD  levothyroxine (SYNTHROID, LEVOTHROID) 100 MCG tablet TAKE 1 TABLET BY MOUTH IN THE MORNING BEFORE BREAKFAST 05/26/17  Yes Troy Sine, MD  metoprolol succinate (TOPROL-XL) 100 MG 24 hr tablet Take 1 tablet (100 mg total) by mouth daily. TAKE 1 & 1/4 (ONE & ONE-FOURTH) TABLETS BY MOUTH ONCE DAILY 08/01/19  Yes Troy Sine, MD  nitroGLYCERIN (NITROSTAT) 0.4 MG SL tablet DISSOLVE ONE TABLET UNDER THE TONGUE EVERY 5 MINUTES AS NEEDED FOR CHEST PAIN.  DO NOT EXCEED A TOTAL OF 3 DOSES IN 15 MINUTES 06/09/19  Yes Troy Sine, MD  NITROSTAT 0.4 MG SL tablet DISSOLVE ONE TABLET UNDER THE TONGUE EVERY 5 MINUTES AS NEEDED FOR CHEST PAIN.  DO NOT EXCEED A TOTAL OF 3 DOSES IN 15 MINUTES 11/20/16  Yes Troy Sine, MD  predniSONE (DELTASONE) 20 MG tablet Take 2 tablets (40 mg total) by mouth daily with breakfast. 07/31/19  Yes Wendie Agreste, MD  ramipril (ALTACE) 10 MG capsule Take 1 capsule by mouth once daily 08/01/19  Yes Troy Sine, MD  ranolazine (RANEXA) 1000 MG SR tablet Take 1 tablet by mouth twice daily 08/01/19  Yes Troy Sine, MD  rosuvastatin (CRESTOR) 20 MG tablet TAKE 1 TABLET BY MOUTH ONCE DAILY IN THE EVENING 08/01/19  Yes Troy Sine, MD   Social History   Socioeconomic History  . Marital status: Married    Spouse name: Not on file  . Number of children: Not on file  . Years of education: 17  . Highest education level: Some college, no degree  Occupational History  . Occupation: Retired  Tobacco Use  . Smoking status: Never Smoker  . Smokeless tobacco: Never Used  Substance and Sexual Activity  . Alcohol use: No    Alcohol/week: 0.0 standard drinks  . Drug use: No  . Sexual activity: Never  Other Topics Concern  . Not on file  Social History Narrative   Married.    Social Determinants of Health   Financial Resource Strain:   .  Difficulty of Paying Living Expenses:   Food Insecurity:   . Worried About Charity fundraiser in the Last Year:   . Arboriculturist in the Last Year:   Transportation Needs:   . Film/video editor (Medical):   Marland Kitchen Lack of Transportation (Non-Medical):   Physical Activity:   . Days of Exercise per Week:   . Minutes of Exercise per Session:   Stress:   . Feeling of Stress :   Social Connections:   . Frequency of Communication with Friends and Family:   . Frequency of Social Gatherings with Friends and Family:   . Attends Religious Services:   . Active  Member of Clubs or Organizations:   . Attends Archivist Meetings:   Marland Kitchen Marital Status:   Intimate Partner Violence:   . Fear of Current or Ex-Partner:   . Emotionally Abused:   Marland Kitchen Physically Abused:   . Sexually Abused:     Review of Systems Per HPI   Objective:   Vitals:   08/14/19 1057  BP: 119/65  Pulse: 61  Resp: 14  Temp: 98.1 F (36.7 C)  TempSrc: Temporal  SpO2: 97%  Weight: 185 lb (83.9 kg)  Height: 5\' 8"  (1.727 m)     Physical Exam Vitals reviewed.  Constitutional:      General: He is not in acute distress.    Appearance: He is well-developed.  HENT:     Head: Normocephalic and atraumatic.  Cardiovascular:     Rate and Rhythm: Normal rate.  Pulmonary:     Effort: Pulmonary effort is normal.  Musculoskeletal:     Comments: Guarded range of motion right greater than left shoulder, slight decreased right grip strength versus left, other strength testing in arms appeared equal grossly.  Neurological:     Mental Status: He is alert and oriented to person, place, and time.     Results for orders placed or performed in visit on 08/14/19  POCT glucose (manual entry)  Result Value Ref Range   POC Glucose 115 (A) 70 - 99 mg/dl  POCT glycosylated hemoglobin (Hb A1C)  Result Value Ref Range   Hemoglobin A1C 6.1 (A) 4.0 - 5.6 %   HbA1c POC (<> result, manual entry)     HbA1c, POC (prediabetic  range)     HbA1c, POC (controlled diabetic range)       Assessment & Plan:  TAMARIO BAGWELL is a 79 y.o. male . 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  Planned nerve conduction studies to evaluate for carpal tunnel versus cervical spine.  Previously improved and weakness improved with prednisone.    -Additional prednisone taper, potential side effects discussed slight hyperglycemia with prediabetes as below.    -Tramadol if needed for more severe pain fall risks/side effects discussed, precautions given., Tylenol throughout the day for now.  Follow-up with orthopedics discussed, including sooner if worsening symptoms.   Hyperglycemia - Plan: POCT glucose (manual entry), POCT glycosylated hemoglobin (Hb A1C)  -Slight hyperglycemia with A1c in prediabetes range.  Will advise patient, monitor diet, recheck levels in the next 3 to 6 months.   Meds ordered this encounter  Medications  . predniSONE (DELTASONE) 20 MG tablet    Sig: 2 by mouth for 4 days, then 1 by mouth for 3 days, then 1/2 by mouth for 2 days.    Dispense:  12 tablet    Refill:  0  . traMADol (ULTRAM) 50 MG tablet    Sig: Take 1 tablet (50 mg total) by mouth every 8 (eight) hours as needed for up to 5 days.    Dispense:  15 tablet    Refill:  0   Patient Instructions   We can try one more taper of prednisone, but if pain returns to same level or return of hand weakness, I recommend following up with ortho sooner. Continue tylenol - up to 4  times per day, OR tramadol if needed for more severe pain. Be careful with possible sedation/unsteadiness if you take tramadol.   Return to the clinic or go to the nearest  emergency room if any of your symptoms worsen or new symptoms occur.   If you have lab work done today you will be contacted with your lab results within the next 2 weeks.  If you have not heard from [place] then please contact us. The fastest way to get your results is to register for My Chart.   IF you received an x-ray today, you will receive an invoice from [facility]. Please contact [facility] at [phone] with questions or concerns regarding your invoice.   IF you received labwork today, you will receive an invoice from [facility]. Please contact [facility] at [phone] with questions or concerns regarding your invoice.   Our billing staff will not be able to assist you with questions regarding bills from these companies.  You will be contacted with the lab results as soon as they are available. The fastest way to get your results is to activate your My Chart account. Instructions are located on the last page of this paperwork. If you have not heard from [place] regarding the results in 2 weeks, please contact this office.         Signed, Merri Ray, MD [facility]

## 2019-08-15 NOTE — Telephone Encounter (Signed)
Spoke with pt and gave him his lab results per Dr. Carlota Raspberry.

## 2019-09-01 ENCOUNTER — Ambulatory Visit
Admission: RE | Admit: 2019-09-01 | Discharge: 2019-09-01 | Disposition: A | Discharge status: Home / Self Care | Payer: PPO | Source: Ambulatory Visit | Attending: Orthopaedic Surgery | Admitting: Orthopaedic Surgery

## 2019-09-01 ENCOUNTER — Other Ambulatory Visit: Payer: Self-pay

## 2019-09-01 DIAGNOSIS — M25511 Pain in right shoulder: Secondary | ICD-10-CM

## 2019-09-22 ENCOUNTER — Ambulatory Visit (INDEPENDENT_AMBULATORY_CARE_PROVIDER_SITE_OTHER): Payer: PPO | Admitting: Physical Medicine and Rehabilitation

## 2019-09-22 ENCOUNTER — Encounter: Payer: Self-pay | Admitting: Physical Medicine and Rehabilitation

## 2019-09-22 ENCOUNTER — Other Ambulatory Visit: Payer: Self-pay | Admitting: Cardiovascular Disease

## 2019-09-22 ENCOUNTER — Other Ambulatory Visit: Payer: Self-pay

## 2019-09-22 DIAGNOSIS — R202 Paresthesia of skin: Secondary | ICD-10-CM | POA: Diagnosis not present

## 2019-09-22 NOTE — Progress Notes (Signed)
Numbness in both hands. Worse in third and fourth fingers. Symptoms decrease with more activity. Worse in evening and morning. Right hand dominant. Numeric Pain Rating Scale and Functional Assessment Average Pain 6   In the last MONTH (on 0-10 scale) has pain interfered with the following?  1. General activity like being  able to carry out your everyday physical activities such as walking, climbing stairs, carrying groceries, or moving a chair?  Rating(6)   +Driver, -BT, -Dye Allergies.

## 2019-09-25 ENCOUNTER — Encounter: Payer: Self-pay | Admitting: Physical Medicine and Rehabilitation

## 2019-09-25 NOTE — Progress Notes (Signed)
Bradley Oconnor - 79 y.o. male MRN [id number]  Date of birth: [birth year]  Office Visit Note: Visit Date: 09/22/2019 PCP: Wendie Agreste, MD Referred by: Wendie Agreste, MD  Subjective: Chief Complaint  Patient presents with  . Right Hand - Numbness  . Left Hand - Numbness   HPI:  Bradley Oconnor is a 79 y.o. male who comes in today At the request of Dr. Joni Fears for electrodiagnostic study of both upper limbs. Patient is right-hand dominant with history of remote ACDF at C5-C7. I have actually seen him in the past for epidural injection of the cervical spine. Last MRI of the cervical spine was in 2019 this is reviewed today. He reports numbness in both hands worse in the third and fourth digits bilaterally. Does not really endorse symptoms from the neck down although he does have bilateral shoulder pain. He gets worsening symptoms at rest and worse in the evening in the morning. He says using his hands he actually gets decreasing symptoms. He has not noted any focal weakness. He has not had prior electrodiagnostic study or carpal tunnel release. No history of diabetes.  ROS Otherwise per HPI.  Assessment & Plan: Visit Diagnoses:  1. Paresthesia of skin     Plan: Impression: The above electrodiagnostic study is ABNORMAL and reveals evidence of:  1. 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   2. 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  3. 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  There is no significant electrodiagnostic evidence of any other focal nerve entrapment, brachial plexopathy or cervical radiculopathy.   Recommendations: 1.  Follow-up with referring physician. 2.  Continue current management of symptoms. 3.   Continue use of resting splint at night-time and as needed during the day. 4.  Suggest surgical evaluation.   Meds & Orders: No orders of the defined types were placed in this encounter.   Orders Placed This Encounter  Procedures  . NCV with EMG (electromyography)    Follow-up: Return for Joni Fears, MD.   Procedures: No procedures performed  Impression: The above electrodiagnostic study is ABNORMAL and reveals evidence of:  1. 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   2. 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  3. 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  There is no significant electrodiagnostic evidence of any other focal nerve entrapment, brachial plexopathy or cervical radiculopathy.   Recommendations: 1.  Follow-up with referring physician. 2.  Continue current management of symptoms. 3.  Continue use of resting splint at night-time and as needed during the day. 4.  Suggest surgical evaluation.  ___________________________ Laurence Spates FAAPMR Board Certified, American Board of Physical Medicine and Rehabilitation    Nerve Conduction Studies Anti Sensory Summary Table   Stim Site NR Peak (ms) Norm Peak (ms) P-T Amp (V) Norm P-T Amp Site1 Site2 Delta-P (ms) Dist (cm) Vel (m/s) Norm Vel (m/s)  Left Median Acr Palm Anti Sensory (2nd Digit)  30C  Wrist    *4.5 <3.6 20.3 >10 Wrist Palm 2.0 0.0    Palm    *2.5 <2.0  24.1         Right Median Acr Palm Anti Sensory (2nd Digit)  30.1C  Wrist    *6.1 <3.6 15.5 >10 Wrist Palm 3.8 0.0    Palm    *2.3 <2.0 22.4         Right Radial Anti Sensory (Base 1st Digit)  30.1C  Wrist    2.5 <3.1 20.7  Wrist Base 1st Digit 2.5 0.0    Right Ulnar Anti Sensory (5th Digit)  30.3C    Wrist    *4.2 <3.7 *14.5 >15.0 Wrist 5th Digit 4.2 14.0 *33 >38   Motor Summary Table   Stim Site NR Onset (ms) Norm Onset (ms) O-P Amp (mV) Norm O-P Amp Site1 Site2 Delta-0 (ms) Dist (cm) Vel (m/s) Norm Vel (m/s)  Left Median Motor (Abd Poll Brev)  29.9C  Wrist    3.8 <4.2 8.6 >5 Elbow Wrist 4.2 21.0 50 >50  Elbow    8.0  8.3         Right Median Motor (Abd Poll Brev)  30.2C  Wrist    *4.8 <4.2 6.7 >5 Elbow Wrist 4.7 21.0 *45 >50  Elbow    9.5  2.7         Right Ulnar Motor (Abd Dig Min)  30.3C  Wrist    3.7 <4.2 7.5 >3 B Elbow Wrist 3.5 20.5 59 >53  B Elbow    7.2  8.5  A Elbow B Elbow 2.6 10.0 *38 >53  A Elbow    9.8  2.2          EMG   Side Muscle Nerve Root Ins Act Fibs Psw Amp Dur Poly Recrt Int Fraser Din Comment  Right Abd Poll Brev Median C8-T1 Nml Nml Nml Nml Nml 0 Nml Nml   Right 1stDorInt Ulnar C8-T1 Nml Nml Nml Nml Nml 0 Nml Nml   Right PronatorTeres Median C6-7 Nml Nml Nml Nml Nml 0 Nml Nml   Right Biceps Musculocut C5-6 Nml Nml Nml Nml Nml 0 Nml Nml   Right Deltoid Axillary C5-6 Nml Nml Nml Nml Nml 0 Nml Nml     Nerve Conduction Studies Anti Sensory Left/Right Comparison   Stim Site L Lat (ms) R Lat (ms) L-R Lat (ms) L Amp (V) R Amp (V) L-R Amp (%) Site1 Site2 L Vel (m/s) R Vel (m/s) L-R Vel (m/s)  Median Acr Palm Anti Sensory (2nd Digit)  30C  Wrist *4.5 *6.1 1.6 20.3 15.5 23.6 Wrist Palm     Palm *2.5 *2.3 0.2 24.1 22.4 7.1       Radial Anti Sensory (Base 1st Digit)  30.1C  Wrist  2.5   20.7  Wrist Base 1st Digit     Ulnar Anti Sensory (5th Digit)  30.3C  Wrist  *4.2   *14.5  Wrist 5th Digit  *33    Motor Left/Right Comparison   Stim Site L Lat (ms) R Lat (ms) L-R Lat (ms) L Amp (mV) R Amp (mV) L-R Amp (%) Site1 Site2 L Vel (m/s) R Vel (m/s) L-R Vel (m/s)  Median Motor (Abd Poll Brev)  29.9C  Wrist 3.8 *4.8 *1.0 8.6 6.7 22.1 Elbow Wrist 50 *45 5  Elbow 8.0 9.5 1.5 8.3 2.7 67.5       Ulnar Motor (Abd Dig Min)  30.3C  Wrist  3.7   7.5  B Elbow Wrist   59   B Elbow  7.2   8.5  A Elbow B Elbow  *38  A Elbow  9.8   2.2           Waveforms:                 Clinical History: MRI CERVICAL SPINE WITHOUT CONTRAST  TECHNIQUE: Multiplanar, multisequence MR imaging of the cervical spine was performed. No intravenous contrast was administered.  COMPARISON:  Radiography 02/10/2018.  MRI 07/07/2008.  FINDINGS: Alignment: Normal  Vertebrae: Distant ACDF C5-C7.  Cord: No cord compression or primary cord lesion.  Posterior Fossa, vertebral arteries, paraspinal tissues: Negative  Disc levels:  Foramen magnum, C1-2 and C2-3 are normal.  C3-4: Degenerative spondylosis with endplate osteophytes and broad-based disc herniation more prominent towards the left. Effacement of the ventral subarachnoid space but no compression of the cord. AP diameter of the canal as narrow as 8.4 mm. Foraminal encroachment left worse than right. Either C4 nerve could be compressed, particularly the left.  C4-5: Endplate osteophytes and shallow broad-based disc herniation. Effacement of the ventral subarachnoid space but no cord compression. AP diameter of the canal as narrow as 8 mm in the midline. There is bilateral foraminal stenosis because of encroachment by osteophyte and disc material, right more than left.  C5 through C7: Previous ACDF has a good appearance with solid union and wide patency of the canal and foramina.  C7-T1: Bulging of the disc.  No canal or foraminal stenosis.  IMPRESSION: Good appearance in the fusion segment from C5-C7.  C3-4: Endplate osteophytes and broad-based disc herniation more prominent towards the left. Canal narrowing but no cord compression. Bilateral foraminal stenosis could compress either or both C4 nerves.  C4-5: Endplate osteophytes and broad-based disc herniation. Canal narrowing but no cord compression. Bilateral foraminal stenosis could compress either or both C5  nerves.   Electronically Signed   By: Nelson Chimes M.D.   On: 03/19/2018 16:34     Objective:  VS:  HT:    WT:   BMI:     BP:   HR: bpm  TEMP: ( )  RESP:  Physical Exam Musculoskeletal:        General: No tenderness.     Comments: Inspection reveals no atrophy of the bilateral APB or FDI or hand intrinsics. There is no swelling, color changes, allodynia or dystrophic changes. There is 5 out of 5 strength in the bilateral wrist extension, finger abduction and long finger flexion. There is intact sensation to light touch in all dermatomal and peripheral nerve distributions.  There is a negative Hoffmann's test bilaterally.  Skin:    General: Skin is warm and dry.     Findings: No erythema or rash.  Neurological:     General: No focal deficit present.     Mental Status: He is alert and oriented to person, place, and time.     Sensory: No sensory deficit.     Motor: No weakness or abnormal muscle tone.     Coordination: Coordination normal.     Gait: Gait normal.  Psychiatric:        Mood and Affect: Mood normal.        Behavior: Behavior normal.        Thought Content: Thought content normal.      Imaging: No results found.

## 2019-09-25 NOTE — Procedures (Signed)
Impression: The above electrodiagnostic study is ABNORMAL and reveals evidence of:  1. 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   2. 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  3. 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  There is no significant electrodiagnostic evidence of any other focal nerve entrapment, brachial plexopathy or cervical radiculopathy.   Recommendations: 1.  Follow-up with referring physician. 2.  Continue current management of symptoms. 3.  Continue use of resting splint at night-time and as needed during the day. 4.  Suggest surgical evaluation.  ___________________________ Laurence Spates FAAPMR Board Certified, American Board of Physical Medicine and Rehabilitation    Nerve Conduction Studies Anti Sensory Summary Table   Stim Site NR Peak (ms) Norm Peak (ms) P-T Amp (V) Norm P-T Amp Site1 Site2 Delta-P (ms) Dist (cm) Vel (m/s) Norm Vel (m/s)  Left Median Acr Palm Anti Sensory (2nd Digit)  30C  Wrist    *4.5 <3.6 20.3 >10 Wrist Palm 2.0 0.0    Palm    *2.5 <2.0 24.1         Right Median Acr Palm Anti Sensory (2nd Digit)  30.1C  Wrist    *6.1 <3.6 15.5 >10 Wrist Palm 3.8 0.0    Palm    *2.3 <2.0 22.4         Right Radial Anti Sensory (Base 1st Digit)  30.1C  Wrist    2.5 <3.1 20.7  Wrist Base 1st Digit 2.5 0.0    Right Ulnar Anti Sensory (5th Digit)  30.3C  Wrist    *4.2 <3.7 *14.5 >15.0 Wrist 5th Digit 4.2 14.0 *33 >38   Motor Summary Table   Stim Site NR Onset (ms) Norm Onset (ms) O-P Amp (mV) Norm O-P Amp Site1 Site2 Delta-0 (ms) Dist (cm) Vel (m/s) Norm Vel (m/s)  Left Median Motor (Abd Poll Brev)  29.9C  Wrist    3.8 <4.2 8.6 >5 Elbow Wrist 4.2 21.0 50 >50  Elbow    8.0  8.3         Right Median  Motor (Abd Poll Brev)  30.2C  Wrist    *4.8 <4.2 6.7 >5 Elbow Wrist 4.7 21.0 *45 >50  Elbow    9.5  2.7         Right Ulnar Motor (Abd Dig Min)  30.3C  Wrist    3.7 <4.2 7.5 >3 B Elbow Wrist 3.5 20.5 59 >53  B Elbow    7.2  8.5  A Elbow B Elbow 2.6 10.0 *38 >53  A Elbow    9.8  2.2          EMG   Side Muscle Nerve Root Ins Act Fibs Psw Amp Dur Poly Recrt Int Fraser Din Comment  Right Abd Poll Brev Median C8-T1 Nml Nml Nml Nml Nml 0 Nml Nml   Right 1stDorInt Ulnar C8-T1 Nml Nml Nml Nml Nml 0 Nml Nml   Right PronatorTeres Median C6-7 Nml Nml Nml Nml Nml 0 Nml Nml   Right Biceps Musculocut C5-6 Nml Nml Nml Nml Nml 0 Nml Nml   Right Deltoid Axillary C5-6 Nml Nml Nml Nml Nml 0 Nml Nml     Nerve Conduction Studies Anti Sensory Left/Right Comparison   Stim Site L Lat (ms) R Lat (ms) L-R Lat (ms) L Amp (V) R Amp (  V) L-R Amp (%) Site1 Site2 L Vel (m/s) R Vel (m/s) L-R Vel (m/s)  Median Acr Palm Anti Sensory (2nd Digit)  30C  Wrist *4.5 *6.1 1.6 20.3 15.5 23.6 Wrist Palm     Palm *2.5 *2.3 0.2 24.1 22.4 7.1       Radial Anti Sensory (Base 1st Digit)  30.1C  Wrist  2.5   20.7  Wrist Base 1st Digit     Ulnar Anti Sensory (5th Digit)  30.3C  Wrist  *4.2   *14.5  Wrist 5th Digit  *33    Motor Left/Right Comparison   Stim Site L Lat (ms) R Lat (ms) L-R Lat (ms) L Amp (mV) R Amp (mV) L-R Amp (%) Site1 Site2 L Vel (m/s) R Vel (m/s) L-R Vel (m/s)  Median Motor (Abd Poll Brev)  29.9C  Wrist 3.8 *4.8 *1.0 8.6 6.7 22.1 Elbow Wrist 50 *45 5  Elbow 8.0 9.5 1.5 8.3 2.7 67.5       Ulnar Motor (Abd Dig Min)  30.3C  Wrist  3.7   7.5  B Elbow Wrist  59   B Elbow  7.2   8.5  A Elbow B Elbow  *38   A Elbow  9.8   2.2           Waveforms:

## 2019-09-26 ENCOUNTER — Ambulatory Visit: Payer: PPO | Admitting: Orthopaedic Surgery

## 2019-09-26 ENCOUNTER — Other Ambulatory Visit: Payer: Self-pay

## 2019-09-26 DIAGNOSIS — G5603 Carpal tunnel syndrome, bilateral upper limbs: Secondary | ICD-10-CM | POA: Diagnosis not present

## 2019-09-26 DIAGNOSIS — G8929 Other chronic pain: Secondary | ICD-10-CM | POA: Diagnosis not present

## 2019-09-26 DIAGNOSIS — M25512 Pain in left shoulder: Secondary | ICD-10-CM | POA: Diagnosis not present

## 2019-09-26 DIAGNOSIS — M542 Cervicalgia: Secondary | ICD-10-CM

## 2019-09-26 DIAGNOSIS — M25511 Pain in right shoulder: Secondary | ICD-10-CM

## 2019-09-26 MED ORDER — METHYLPREDNISOLONE ACETATE 40 MG/ML IJ SUSP
80.0000 mg | INTRAMUSCULAR | Status: AC | PRN
  Administered 2019-09-26: 80 mg via INTRA_ARTICULAR

## 2019-09-26 MED ORDER — LIDOCAINE HCL 2 % IJ SOLN
2.0000 mL | INTRAMUSCULAR | Status: AC | PRN
  Administered 2019-09-26: 2 mL

## 2019-09-26 MED ORDER — BUPIVACAINE HCL 0.5 % IJ SOLN
2.0000 mL | INTRAMUSCULAR | Status: AC | PRN
  Administered 2019-09-26: 2 mL via INTRA_ARTICULAR

## 2019-09-26 NOTE — Progress Notes (Signed)
Office Visit Note   Patient: Bradley Oconnor           Date of Birth: May 26, 1940           MRN: [id number] Visit Date: 09/26/2019              Requested by: Wendie Agreste, MD [address] [place],  [place] [address] PCP: Wendie Agreste, MD   Assessment & Plan: Visit Diagnoses:  1. Chronic pain of both shoulders   2. Cervicalgia   3. Carpal tunnel syndrome, bilateral     Plan: Bradley Oconnor had EMGs nerve conduction studies to both upper extremities.  He has a moderate carpal tunnel on the right and mild on the left.  He appears to have good asymptomatic ulnar nerve compression at the right elbow.  After much discussion he like to proceed with right carpal tunnel release.  I discussed the surgery with him, the incision, outpatient nature, nerve block and what he can expect postoperatively.  He also is having trouble with both of his shoulders more on the right than the left.  He has had a number of recent falls and had an MRI scan of his right shoulder performed in the last month or so demonstrating rotator cuff tendinitis and very mild glenohumeral joint arthritis.  He does have positive impingement.  I am going to inject the shoulder with cortisone and started course of physical therapy  Follow-Up Instructions: Return We will schedule right carpal tunnel release.   Orders:  No orders of the defined types were placed in this encounter.  No orders of the defined types were placed in this encounter.     Procedures: Large Joint Inj: R subacromial bursa on 09/26/2019 1:35 PM Indications: pain and diagnostic evaluation Details: 25 G 1.5 in needle, anterolateral approach  Arthrogram: No  Medications: 2 mL lidocaine 2 %; 2 mL bupivacaine 0.5 %; 80 mg methylPREDNISolone acetate 40 MG/ML Consent was given by the patient. Immediately prior to procedure a time out was called to verify the correct patient, procedure, equipment, support staff and site/side marked as required. Patient  was prepped and draped in the usual sterile fashion.       Clinical Data: No additional findings.   Subjective: Chief Complaint  Patient presents with  . Right Shoulder - Follow-up    MRI Review, EMG Review  Bradley Oconnor had an MRI scan of his right shoulder approximately 3 weeks ago demonstrating rotator cuff tendinitis, AC joint arthritis very mild glenohumeral joint arthritis.  He has had a number of falls over the last several months contributing to his present pain.  He is having difficulty raising his arm over his head and sleeping at night.  He is also had some issues with his cervical spine.  He had a prior fusion at C5-6 with an recent CT scan after one of his falls was negative for any acute trauma.  There are some degenerative changes.  He had EMGs and nerve conduction studies to both upper extremities by Dr. Ernestina Patches.  He has moderate right and mild left carpal tunnel syndrome.  He is more symptomatic on the right and having trouble with numbness and tingling particularly in the index long and ring fingers  HPI  Review of Systems   Objective: Vital Signs: There were no vitals taken for this visit.  Physical Exam Constitutional:      Appearance: He is well-developed.  Eyes:     Pupils: Pupils are equal, round,  and reactive to light.  Pulmonary:     Effort: Pulmonary effort is normal.  Skin:    General: Skin is warm and dry.  Neurological:     Mental Status: He is alert and oriented to person, place, and time.  Psychiatric:        Behavior: Behavior normal.     Ortho Exam awake alert and oriented x3.  Comfortable sitting.  Has positive Phalen's about the right wrist and negative Tinel's.  He has good opposition of thumb to little finger without any obvious intrinsic atrophy.  Has some decreased sensibility on the radial side of the ring finger on the index and long as well.  Normal opposition of thumb to little finger.  Has some mild pain with range of motion of  the cervical spine and positive impingement testing and mildly positive empty can testing right shoulder.  Speeds sign is negative.  Good strength.  Good grip and good release.  Specialty Comments:  No specialty comments available.  Imaging: No results found.   PMFS History: Patient Active Problem List   Diagnosis Date Noted  . Carpal tunnel syndrome, bilateral 09/26/2019  . Shoulder pain, bilateral 08/03/2019  . Cervicalgia 03/28/2018  . GERD (gastroesophageal reflux disease) 04/10/2013  . HTN (hypertension) 04/10/2013  . CAD (coronary artery disease) of artery bypass graft 10/04/2012  . Hypothyroidism 10/04/2012  . Hyperlipidemia with target LDL less than 70 10/04/2012  . Kidney stones 01/27/2012  . Gallstones-symptomatic 01/27/2012  . Hx of appendectomy-history of ruptured requiring ileocecectomy (~1994) 01/27/2012   Past Medical History:  Diagnosis Date  . Blood transfusion without reported diagnosis   . CAD (coronary artery disease)   . GERD (gastroesophageal reflux disease)   . Heart attack ([place])   . Hyperlipidemia   . Hypertension 03/07/12   ECHO-WNL     08/12/11 Lexiscan MyoviewNo significant ischemia demonstrated Low risk scan There is a moderate sized dense scar in the LCX territoy unchanged from the prior study.. Post- stress EF is 40%.  . Thyroid disease     Family History  Problem Relation Age of Onset  . Heart disease Mother   . Cancer Mother        breast, stomach  . Heart disease Father   . Hyperlipidemia Father   . Heart disease Brother   . Diabetes Brother   . Hyperlipidemia Brother   . Heart disease Paternal Grandfather   . Healthy Brother     Past Surgical History:  Procedure Laterality Date  . APPENDECTOMY    . CERVICAL SPINE SURGERY     titanium plate in the back of neck  . CORONARY ARTERY BYPASS GRAFT    . HERNIA REPAIR    . SMALL INTESTINE SURGERY    . 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 Occupation: Retired  Tobacco Use  . Smoking status: Never Smoker  . Smokeless tobacco: Never Used  Vaping Use  . Vaping Use: Never used  Substance and Sexual Activity  . Alcohol use: No    Alcohol/week: 0.0 standard drinks  . Drug use: No  . Sexual activity: Never     Garald Balding, MD   Note - This record has been created using [employer].  Chart creation errors have been sought, but may not always  have been located. Such creation errors do not reflect on  the standard of medical care.

## 2019-10-10 ENCOUNTER — Encounter: Payer: Self-pay | Admitting: Physical Therapy

## 2019-10-10 ENCOUNTER — Other Ambulatory Visit: Payer: Self-pay

## 2019-10-10 ENCOUNTER — Ambulatory Visit: Payer: PPO | Admitting: Physical Therapy

## 2019-10-10 DIAGNOSIS — M25512 Pain in left shoulder: Secondary | ICD-10-CM | POA: Diagnosis not present

## 2019-10-10 DIAGNOSIS — M542 Cervicalgia: Secondary | ICD-10-CM | POA: Diagnosis not present

## 2019-10-10 DIAGNOSIS — M6281 Muscle weakness (generalized): Secondary | ICD-10-CM | POA: Diagnosis not present

## 2019-10-10 DIAGNOSIS — M25511 Pain in right shoulder: Secondary | ICD-10-CM | POA: Diagnosis not present

## 2019-10-10 DIAGNOSIS — G8929 Other chronic pain: Secondary | ICD-10-CM

## 2019-10-10 NOTE — Therapy (Signed)
[facility] [address] [place], [place], [address] Phone: [phone]   Fax:  [phone]  Physical Therapy Evaluation  Patient Details  Name: Bradley Oconnor MRN: [id number] Date of Birth: 06-20-1940 Referring Provider (PT): Durward Fortes, MD   Encounter Date: 10/10/2019   PT End of Session - 10/10/19 1356    Visit Number 1    Number of Visits 12    Date for PT Re-Evaluation 11/21/19    Authorization Type [employer] adv    Progress Note Due on Visit 10    PT Start Time 1100    PT Stop Time 1145    PT Time Calculation (min) 45 min    Activity Tolerance Patient tolerated treatment well    Behavior During Therapy [facility] for tasks assessed/performed           Past Medical History:  Diagnosis Date  . Blood transfusion without reported diagnosis   . CAD (coronary artery disease)   . GERD (gastroesophageal reflux disease)   . Heart attack ([place])   . Hyperlipidemia   . Hypertension 03/07/12   ECHO-WNL     08/12/11 Lexiscan MyoviewNo significant ischemia demonstrated Low risk scan There is a moderate sized dense scar in the LCX territoy unchanged from the prior study.. Post- stress EF is 40%.  . Thyroid disease     Past Surgical History:  Procedure Laterality Date  . APPENDECTOMY    . CERVICAL SPINE SURGERY     titanium plate in the back of neck  . CORONARY ARTERY BYPASS GRAFT    . HERNIA REPAIR    . SMALL INTESTINE SURGERY    . 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    Subjective Assessment - 10/10/19 1107    Subjective Fell off ladder in April 2020 and having neck and bilat shoulder pain Rt is worse than left. He relays he will also have carpal tunnel release to Rt hand on 10/12/19. He has previous neck fusion in 2009. He has had injeciton to Rt shoulder recently which he says has overall helped.    Pertinent History PMH: neck fusion, CAD, MI    Limitations Lifting;House  hold activities    Diagnostic tests He has a moderate carpal tunnel on the right and mild on the left based on EMG studies. He is scheduling Carpal tunnel release surgery.MRI scan of his right shoulder performed in the last month or so demonstrating rotator cuff tendinitis and very mild glenohumeral joint arthritis    Currently in Pain? Yes    Pain Score 5     Pain Location Shoulder   neck and arms   Pain Orientation Right;Left    Pain Descriptors / Indicators Aching    Pain Type Acute pain    Pain Radiating Towards down both arms    Pain Onset More than a month ago    Pain Frequency Constant    Aggravating Factors  reaching or any activity using his arms    Pain Relieving Factors tyleonol    Multiple Pain Sites No              [facility] PT Assessment - 10/10/19 0001      Assessment   Medical Diagnosis bilat shoulder pain, neck pain    Referring Provider (PT) Durward Fortes, MD    Onset Date/Surgical Date --   worse after fell off ladder April 2021   Next MD Visit  10/19/19    Prior Therapy nothing recent      Precautions   Precautions None      Restrictions   Weight Bearing Restrictions No      Balance Screen   Has the patient fallen in the past 6 months Yes    How many times? 3   all due to being on ladders   Has the patient had a decrease in activity level because of a fear of falling?  No    Is the patient reluctant to leave their home because of a fear of falling?  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      Cognition   Overall Cognitive Status Within Functional Limits for tasks assessed      Sensation   Light Touch Appears Intact      Coordination   Gross Motor Movements are Fluid and Coordinated Yes      ROM / Strength   AROM / PROM / Strength AROM;Strength;PROM      AROM   Overall AROM Comments shoulder  IR behind back to L4 bilat, shoulder ER behind head to C4 bilat    AROM Assessment Site Shoulder;Cervical    Right/Left Shoulder Right;Left    Right Shoulder Flexion 140 Degrees    Right Shoulder ABduction 130 Degrees    Left Shoulder Flexion 140 Degrees    Left Shoulder ABduction 130 Degrees    Cervical Flexion 50    Cervical Extension 30    Cervical - Right Side Bend 20    Cervical - Left Side Bend 20    Cervical - Right Rotation 50    Cervical - Left Rotation 40      Strength   Strength Assessment Site Shoulder    Right/Left Shoulder Right;Left    Right Shoulder Flexion 5/5    Right Shoulder ABduction 4+/5    Right Shoulder Internal Rotation 5/5    Right Shoulder External Rotation 4/5    Left Shoulder Flexion 5/5    Left Shoulder ABduction 4+/5    Left Shoulder Internal Rotation 5/5    Left Shoulder External Rotation 4/5      Palpation   Spinal mobility hypomobility in C-T spine and GH joints    Palpation comment TTP lateral shoulders and traps, denies any pain with spinal palpation      Special Tests   Other special tests mildy + impingment tests on Rt shoulder that were negative on Lt. Negative spulings test for neural compression      Transfers   Transfers Independent with all Transfers                      Objective measurements completed on examination: See above findings.       [facility] Adult PT Treatment/Exercise - 10/10/19 0001      Modalities   Modalities Cryotherapy;Electrical Stimulation      Cryotherapy   Number Minutes Cryotherapy 15 Minutes    Cryotherapy Location Cervical    Type of Cryotherapy Ice pack      Electrical Stimulation   Electrical Stimulation Location neck and Rt shoulder with ice for 15 min    Electrical Stimulation Action IFC    Electrical Stimulation Parameters tolerance     Electrical Stimulation Goals Pain  PT Education - 10/10/19 1355    Education Details HEP, TENS, POC    Person(s)  Educated Patient    Methods Explanation;Demonstration;Verbal cues;Handout    Comprehension Verbalized understanding;Need further instruction               PT Long Term Goals - 10/10/19 1402      PT LONG TERM GOAL #1   Title Pt will be I and compliant with HEP.    Time 6    Period Weeks    Status New    Target Date 11/21/19      PT LONG TERM GOAL #2   Title Pt will improve shoulder ROM to [facility]    Time 6    Period Weeks    Status New      PT LONG TERM GOAL #3   Title Pt will improve shoulder strength to overall 4+/5 for abduction and ER bilat to improve function.    Time 6    Period Weeks    Status New      PT LONG TERM GOAL #4   Title pt will reduce overall pain to less than 3/10 with usual activities.    Status New                  Plan - 10/10/19 1357    Clinical Impression Statement Pt presents with Chronic pain of both shoulders, Rt shoulder RTC tendonitis and impingment. He has a moderate carpal tunnel on the right and mild on the left based on EMG studies. He is scheduling Carpal tunnel release surgery so unsure if MD will want him to have PT for this post op, his current PT prescription just lists bilat shoulders and neck to be addressed.  He is also had some issues with his cervical spine.  He had a prior fusion at C5-6 with an recent CT scan after one of his falls was negative for any acute trauma.  He also is having trouble with both of his shoulders more on the right than the left.  He has had a number of recent falls and had an MRI scan of his right shoulder performed in the last month or so demonstrating rotator cuff tendinitis and very mild glenohumeral joint arthritis. Overall no cervical radiculopathy provoked today with special testing but did have some sings of Right shoulder impingment. He will benefit from skilled PT to address his functional deficits in RTC strength, neck/shoulder ROM and mobility, and to decreased overall pain.    Personal Factors  and Comorbidities Comorbidity 3+    Comorbidities PMH: neck fusion, CAD, MI,exposure to agent orange    Examination-Activity Limitations Lift;Carry;Reach Overhead    Examination-Participation Restrictions Cleaning;Driving;Laundry;Yard Work    Merchant [employer] officer Evolving/Moderate complexity    Clinical Decision Making Moderate    Rehab Potential Good    PT Frequency 2x / week   1-2   PT Duration 6 weeks    PT Treatment/Interventions ADLs/Self Care Home Management;Cryotherapy;Electrical Stimulation;Iontophoresis 4mg /ml Dexamethasone;Traction;Ultrasound;Moist Heat;Therapeutic activities;Therapeutic exercise;Neuromuscular re-education;Manual techniques;Passive range of motion;Dry needling;Joint Manipulations;Taping    PT Next Visit Plan no traction or cerviacal manipulations due to cervical fusion. Review and update HEP PRN, needs RTC strength and neck/GH mobility, does MD want him to have PT post op CT release?    PT Home Exercise Plan Access Code: [email or web address]    Consulted and Agree with Plan of Care Patient           Patient will benefit from skilled therapeutic intervention  in order to improve the following deficits and impairments:  Decreased activity tolerance, Decreased endurance, Decreased mobility, Decreased range of motion, Decreased strength, Hypomobility, Postural dysfunction, Impaired flexibility, Increased muscle spasms, Increased fascial restricitons, Pain  Visit Diagnosis: Chronic right shoulder pain  Chronic left shoulder pain  Cervicalgia  Muscle weakness (generalized)     Problem List Patient Active Problem List   Diagnosis Date Noted  . Carpal tunnel syndrome, bilateral 09/26/2019  . Shoulder pain, bilateral 08/03/2019  . Cervicalgia 03/28/2018  . GERD (gastroesophageal reflux disease) 04/10/2013  . HTN (hypertension) 04/10/2013  . CAD (coronary artery disease) of artery bypass graft 10/04/2012  . Hypothyroidism 10/04/2012  . Hyperlipidemia with  target LDL less than 70 10/04/2012  . Kidney stones 01/27/2012  . Gallstones-symptomatic 01/27/2012  . Hx of appendectomy-history of ruptured requiring ileocecectomy (~1994) 01/27/2012    Silvestre Mesi 10/10/2019, 2:07 PM  [facility] Physical Therapy [address] [place], [place], [address] Phone: [phone]   Fax:  [phone]  Name: Bradley Oconnor MRN: [id number] Date of Birth: 27-Apr-1940

## 2019-10-10 NOTE — Patient Instructions (Signed)
Access Code: [email or web address] URL: [email or web address] Date: 10/10/2019 Prepared by: Elsie Ra  Exercises Seated Cervical Retraction - 2 x daily - 6 x weekly - 10 reps - 2-3 sets Seated Cervical Sidebending Stretch - 2 x daily - 6 x weekly - 3 sets - 30 hold Standing Cervical Rotation AROM with Overpressure - 2 x daily - 6 x weekly - 10 reps - 1-2 sets Standing Row with Anchored Resistance - 2 x daily - 6 x weekly - 10-20 reps - 2-3 sets Standing Shoulder External Rotation with Resistance - 2 x daily - 6 x weekly - 10 reps - 1-3 sets Standing Shoulder Extension with Resistance - 2 x daily - 6 x weekly - 3 sets - 10 reps

## 2019-10-12 ENCOUNTER — Other Ambulatory Visit: Payer: Self-pay | Admitting: Orthopaedic Surgery

## 2019-10-12 DIAGNOSIS — G5601 Carpal tunnel syndrome, right upper limb: Secondary | ICD-10-CM | POA: Diagnosis not present

## 2019-10-12 MED ORDER — HYDROCODONE-ACETAMINOPHEN 5-325 MG PO TABS: 1.0000 | ORAL_TABLET | ORAL | 0 refills | Status: DC | PRN

## 2019-10-13 ENCOUNTER — Inpatient Hospital Stay: Payer: PPO | Admitting: Orthopaedic Surgery

## 2019-10-17 ENCOUNTER — Encounter: Payer: Self-pay | Admitting: Orthopaedic Surgery

## 2019-10-17 ENCOUNTER — Other Ambulatory Visit: Payer: Self-pay

## 2019-10-17 ENCOUNTER — Ambulatory Visit (INDEPENDENT_AMBULATORY_CARE_PROVIDER_SITE_OTHER): Payer: PPO | Admitting: Orthopaedic Surgery

## 2019-10-17 VITALS — Ht 68.0 in | Wt 185.0 lb

## 2019-10-17 DIAGNOSIS — G5603 Carpal tunnel syndrome, bilateral upper limbs: Secondary | ICD-10-CM

## 2019-10-17 NOTE — Progress Notes (Signed)
Office Visit Note   Patient: Bradley Oconnor           Date of Birth: 29-Oct-1940           MRN: [id number] Visit Date: 10/17/2019              Requested by: Wendie Agreste, MD [address] [place],  [place] [address] PCP: Wendie Agreste, MD   Assessment & Plan: Visit Diagnoses:  1. Carpal tunnel syndrome, bilateral     Plan: 5 days status post right carpal tunnel release and doing quite well.  No longer has the numbness or tingling in his fingers.  Not having to wake up at night and shake his hand.  Dressing was removed wound is clean and dry.  Cleaned with hydrogen peroxide and waterproof Band-Aid applied and splint.  Will check again in a week.  Discussed activity modification until he is reevaluated  Follow-Up Instructions: Return in about 1 week (around 10/24/2019).   Orders:  No orders of the defined types were placed in this encounter.  No orders of the defined types were placed in this encounter.     Procedures: No procedures performed   Clinical Data: No additional findings.   Subjective: Chief Complaint  Patient presents with  . Right Hand - Pain, Follow-up    Right carpal tunnel release 10/12/2019  Patient presents today for his right hand and wrist. He had right carpal tunnel release on 10/12/2019. He is now 5 days out from surgery. He fell 4 days ago when his brother's dog wrapped his leash around his leg, causing him to fall. He had the brace on when he fell. He states that it feels like the incision opens and closes, but is still bandaged up. He is taking Tylenol for pain. He has no pain currently.   HPI  Review of Systems   Objective: Vital Signs: Ht 5\' 8"  (1.727 m)   Wt 185 lb (83.9 kg)   BMI 28.13 kg/m   Physical Exam  Ortho Exam resting removed from right hand.  Carpal tunnel incision healing without problem.  Cleaned with hydrogen peroxide.  Good capillary refill to fingers.  No numbness or tingling.  Good opposition of thumb to little  finger  Specialty Comments:  No specialty comments available.  Imaging: No results found.   PMFS History: Patient Active Problem List   Diagnosis Date Noted  . Carpal tunnel syndrome, bilateral 09/26/2019  . Shoulder pain, bilateral 08/03/2019  . Cervicalgia 03/28/2018  . GERD (gastroesophageal reflux disease) 04/10/2013  . HTN (hypertension) 04/10/2013  . CAD (coronary artery disease) of artery bypass graft 10/04/2012  . Hypothyroidism 10/04/2012  . Hyperlipidemia with target LDL less than 70 10/04/2012  . Kidney stones 01/27/2012  . Gallstones-symptomatic 01/27/2012  . Hx of appendectomy-history of ruptured requiring ileocecectomy (~1994) 01/27/2012   Past Medical History:  Diagnosis Date  . Blood transfusion without reported diagnosis   . CAD (coronary artery disease)   . GERD (gastroesophageal reflux disease)   . Heart attack ([place])   . Hyperlipidemia   . Hypertension 03/07/12   ECHO-WNL     08/12/11 Lexiscan MyoviewNo significant ischemia demonstrated Low risk scan There is a moderate sized dense scar in the LCX territoy unchanged from the prior study.. Post- stress EF is 40%.  . Thyroid disease     Family History  Problem Relation Age of Onset  . Heart disease Mother   . Cancer Mother  breast, stomach  . Heart disease Father   . Hyperlipidemia Father   . Heart disease Brother   . Diabetes Brother   . Hyperlipidemia Brother   . Heart disease Paternal Grandfather   . Healthy Brother     Past Surgical History:  Procedure Laterality Date  . APPENDECTOMY    . CERVICAL SPINE SURGERY     titanium plate in the back of neck  . CORONARY ARTERY BYPASS GRAFT    . HERNIA REPAIR    . SMALL INTESTINE SURGERY    . 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 Occupation: Retired  Tobacco Use  . Smoking status: Never Smoker  . Smokeless tobacco: Never Used  Vaping Use  . Vaping Use: Never used  Substance  and Sexual Activity  . Alcohol use: No    Alcohol/week: 0.0 standard drinks  . Drug use: No  . Sexual activity: Never

## 2019-10-18 ENCOUNTER — Encounter: Payer: PPO | Admitting: Physical Therapy

## 2019-10-19 ENCOUNTER — Inpatient Hospital Stay: Payer: PPO | Admitting: Orthopaedic Surgery

## 2019-10-24 ENCOUNTER — Ambulatory Visit: Payer: PPO | Admitting: Orthopaedic Surgery

## 2019-10-24 ENCOUNTER — Encounter: Payer: Self-pay | Admitting: Orthopaedic Surgery

## 2019-10-24 ENCOUNTER — Other Ambulatory Visit: Payer: Self-pay

## 2019-10-24 DIAGNOSIS — G5603 Carpal tunnel syndrome, bilateral upper limbs: Secondary | ICD-10-CM

## 2019-10-24 NOTE — Progress Notes (Signed)
Office Visit Note   Patient: Bradley Oconnor           Date of Birth: November 08, 1940           MRN: [id number] Visit Date: 10/24/2019              Requested by: Wendie Agreste, MD [address] [place],  [place] [address] PCP: Wendie Agreste, MD   Assessment & Plan: Visit Diagnoses:  1. Carpal tunnel syndrome, bilateral     Plan: 12 days status post right carpal tunnel release and doing very well.  No longer has any the preoperative symptoms.  We will plan on removing the stitches today and applied Steri-Strips over benzoin as there is a little bit of overlapping of the incision distally.  Having carpal tunnel symptoms in the left hand.  He will call when he wants to proceed with surgical release  Follow-Up Instructions: Return if symptoms worsen or fail to improve.   Orders:  No orders of the defined types were placed in this encounter.  No orders of the defined types were placed in this encounter.     Procedures: No procedures performed   Clinical Data: No additional findings.   Subjective: Chief Complaint  Patient presents with  . Right Wrist - Pain  12 days status post right carpal tunnel release and doing very well without any of his preoperative symptoms  HPI  Review of Systems   Objective: Vital Signs: There were no vitals taken for this visit.  Physical Exam  Ortho Exam right carpal tunnel incision healing without problem.  Stitches removed and several small Steri-Strips were applied distally where there was some overlap of skin.  No evidence of infection.  Neurologically intact  Specialty Comments:  No specialty comments available.  Imaging: No results found.   PMFS History: Patient Active Problem List   Diagnosis Date Noted  . Carpal tunnel syndrome, bilateral 09/26/2019  . Shoulder pain, bilateral 08/03/2019  . Cervicalgia 03/28/2018  . GERD (gastroesophageal reflux disease) 04/10/2013  . HTN (hypertension) 04/10/2013  . CAD (coronary  artery disease) of artery bypass graft 10/04/2012  . Hypothyroidism 10/04/2012  . Hyperlipidemia with target LDL less than 70 10/04/2012  . Kidney stones 01/27/2012  . Gallstones-symptomatic 01/27/2012  . Hx of appendectomy-history of ruptured requiring ileocecectomy (~1994) 01/27/2012   Past Medical History:  Diagnosis Date  . Blood transfusion without reported diagnosis   . CAD (coronary artery disease)   . GERD (gastroesophageal reflux disease)   . Heart attack ([place])   . Hyperlipidemia   . Hypertension 03/07/12   ECHO-WNL     08/12/11 Lexiscan MyoviewNo significant ischemia demonstrated Low risk scan There is a moderate sized dense scar in the LCX territoy unchanged from the prior study.. Post- stress EF is 40%.  . Thyroid disease     Family History  Problem Relation Age of Onset  . Heart disease Mother   . Cancer Mother        breast, stomach  . Heart disease Father   . Hyperlipidemia Father   . Heart disease Brother   . Diabetes Brother   . Hyperlipidemia Brother   . Heart disease Paternal Grandfather   . Healthy Brother     Past Surgical History:  Procedure Laterality Date  . APPENDECTOMY    . CERVICAL SPINE SURGERY     titanium plate in the back of neck  . CORONARY ARTERY BYPASS GRAFT    . HERNIA REPAIR    .  SMALL INTESTINE SURGERY    . 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 Occupation: Retired  Tobacco Use  . Smoking status: Never Smoker  . Smokeless tobacco: Never Used  Vaping Use  . Vaping Use: Never used  Substance and Sexual Activity  . Alcohol use: No    Alcohol/week: 0.0 standard drinks  . Drug use: No  . Sexual activity: Never     Garald Balding, MD   Note - This record has been created using [employer].  Chart creation errors have been sought, but may not always  have been located. Such creation errors do not reflect on  the standard of medical care.

## 2019-10-26 ENCOUNTER — Other Ambulatory Visit: Payer: Self-pay | Admitting: Cardiovascular Disease

## 2019-10-27 ENCOUNTER — Encounter: Payer: PPO | Admitting: Physical Therapy

## 2019-11-01 ENCOUNTER — Encounter: Payer: PPO | Admitting: Physical Therapy

## 2019-11-08 ENCOUNTER — Encounter: Payer: PPO | Admitting: Physical Therapy

## 2019-11-13 ENCOUNTER — Ambulatory Visit: Payer: Self-pay | Admitting: Family Medicine

## 2019-11-15 ENCOUNTER — Encounter: Payer: PPO | Admitting: Physical Therapy

## 2019-11-22 ENCOUNTER — Encounter: Payer: PPO | Admitting: Physical Therapy

## 2019-11-23 ENCOUNTER — Other Ambulatory Visit: Payer: Self-pay

## 2019-11-23 ENCOUNTER — Encounter: Payer: Self-pay | Admitting: Cardiovascular Disease

## 2019-11-23 ENCOUNTER — Ambulatory Visit: Payer: PPO | Admitting: Cardiovascular Disease

## 2019-11-23 DIAGNOSIS — E039 Hypothyroidism, unspecified: Secondary | ICD-10-CM

## 2019-11-23 DIAGNOSIS — I10 Essential (primary) hypertension: Secondary | ICD-10-CM

## 2019-11-23 DIAGNOSIS — E785 Hyperlipidemia, unspecified: Secondary | ICD-10-CM | POA: Diagnosis not present

## 2019-11-23 DIAGNOSIS — Z951 Presence of aortocoronary bypass graft: Secondary | ICD-10-CM

## 2019-11-23 DIAGNOSIS — E782 Mixed hyperlipidemia: Secondary | ICD-10-CM | POA: Diagnosis not present

## 2019-11-23 DIAGNOSIS — I25709 Atherosclerosis of coronary artery bypass graft(s), unspecified, with unspecified angina pectoris: Secondary | ICD-10-CM | POA: Diagnosis not present

## 2019-11-23 NOTE — Patient Instructions (Signed)
Medication Instructions:  CONTINUE WITH CURRENT MEDICATIONS. NO CHANGES.  *If you need a refill on your cardiac medications before your next appointment, please call your pharmacy*   Lab Work: FASTING LABS: CMET CBC TSH LIPID  If you have labs (blood work) drawn today and your tests are completely normal, you will receive your results only by: Marland Kitchen MyChart Message (if you have MyChart) OR . A paper copy in the mail If you have any lab test that is abnormal or we need to change your treatment, we will call you to review the results.     Follow-Up: At [facility], you and your health needs are our priority.  As part of our continuing mission to provide you with exceptional heart care, we have created designated Provider Care Teams.  These Care Teams include your primary Cardiologist (physician) and Advanced Practice Providers (APPs -  Physician Assistants and Nurse Practitioners) who all work together to provide you with the care you need, when you need it.  We recommend signing up for the patient portal called "MyChart".  Sign up information is provided on this After Visit Summary.  MyChart is used to connect with patients for Virtual Visits (Telemedicine).  Patients are able to view lab/test results, encounter notes, upcoming appointments, etc.  Non-urgent messages can be sent to your provider as well.   To learn more about what you can do with MyChart, go to [email or web address].    Your next appointment:   12 month(s)  The format for your next appointment:   In Person  Provider:   Shelva Majestic, MD

## 2019-11-23 NOTE — Progress Notes (Signed)
Patient ID: Bradley Oconnor, male   DOB: 09-08-1940, 79 y.o.   MRN: [id number]     HPI: Bradley Oconnor, is a 79 y.o. male who presents to the office for a 7 month follow-up cardiology evaluation.  Bradley Oconnor has established CAD dating back to 1992 when he suffered an inferior wall myocardial infarction and underwent PTCA of a totally occluded RCA. In 1993 due to progressive CAD, he underwent CABG surgery with a LIMA to his LAD, vein graft sequentially to a diagonal and marginal vein graft to his PDA branch of his right carotid artery. In September 2002 a stent was placed the PLA of his RCA.  In June 2011, he suffered a non-ST segment elevation MI which was felt to be due to RCA graft occlusion which supplied the PDA and PLA vessel. The PDA was extensively collateralized now via the left circumflex territory. His native RCA was totally occluded at the mid level. His LIMA graft is widely patent as was the sequential graft to the diagonal marginal vessel. He has done well particularly with the addition of Ranexa titrated up to 1000 twice a day added to his medical regimen. He believes the Ranexa has made a huge difference in his anginal symptomatology. He is unaware of palpitations he denies presyncope or syncope.  Additional problems include GERD, hyperlipidemia, hypertension.  In 2015, he experienced 3 short-lived episodes of some mild chest pain.  He feels that his CAD is stable.  His wife unfortunately has suffered multiple small strokes and she is now fairly incapacitated and he spends much of his time caring for her.  As result, he has not been as active as he had in the past.  Over the past year, he denies recurrent anginal symptoms. He denies dizziness.  He he has been on ramipril 10 mg, amlodipine 5 mg, HCTZ 12.5 mg as needed for edema, in addition to Toprol-XL 125 mg and isosorbide 120 mg.  He denies bleeding on aspirin.  He has been taking 325 mg aspirin.  He also has been on ranolazine 1000 g  twice a day.  He is was recently switched by his insurance company to generic Crestor 20 mg daily, which he has been on for 30 days and also takes over-the-counter fish oil. He has hypothyroidism on Synthroid replacement and Prevacid for GERD.   He underwent an echo Doppler study on 07/07/2016.  This showed normal systolic function with an EF of 55-60%.  There was grade 1 diastolic dysfunction.  He had mild aortic sclerosis with trivial AR, mild to moderate mitral regurgitation, and had biatrial enlargement, left greater than right.  Peak PA pressure was 25 mm  I  saw [name] at which time he remained stable.  He denies any recurrent chest pain.  He believes the addition of Ranexa to his medical regimen.  Several years ago was again change her and made dramatic improvement in his symptomatology.  He has not been able to exercise as regularly and often times does not sleep as well in caring for his disabled wife. She had suffered multiple strokes in addition has dementia. He is unaware of PND, orthopnea, palpitations, presyncope or syncope.  I  saw him in March 2019 and last evaluated him in a telemedicine visit on July 01, 2018 due to COVID-19 pandemic.  Over the year prior to that evaluation he had continued to do remarkably well on an aggressive medical regimen consisting of metoprolol succinate 125 mg daily, amlodipine  5 mg, isosorbide 120 mg, and ranolazine 1000 mg twice a day.  Over the  8 months prior to the telemedicine visit he admitted to 3 short-lived episodes of chest discomfort which lasted approximately 10 to 15 minutes and ultimately went away on its own.  If he starts out walking more rapidly he may note some mild shortness of breath initially but this improves as he continues to walk.  He has continued to cut his grass at home and do yard work without typical anginal symptoms.  He does have periods of intermittent ankle swelling for which he has been taking HCTZ 12.5 mg as needed.    He also has issues with GERD and it is becoming more difficult to obtain Prevacid and as result he now is on Nexium and as long as he takes treatment he does not have reflux symptomatology.  He continues to care for his disabled wife who also has significant dementia.  She was hospitalized until early this week with recent pneumonia and urinary infection.  She is now back home.  As result he has not been able to exercise regularly due to caring for her.  He has continued to be on rosuvastatin for hyperlipidemia.  He continues to be on omega-3 fatty acid. Two years ago triglyceride levels were significantly elevated which did improve with omega-3 fatty acid therapy.  He has not had laboratory checked in a year.  He had recently developed significant neck pain and has undergone Cervical epidural cortisone injection which he states helped for approximately 2-1/2 weeks but his symptoms have recurred.   I last saw him in the office in January 2021.  Since his last telemedicine evaluation, he has continued to feel well.  He has only required taking sublingual nitroglycerin on 2 occasions over the past 9 months.  His blood pressure has been stable.  He is unaware of palpitations presyncope or syncope.  He is continuing to care for his disabled wife.    Since I last saw him he admits to being somewhat more fatigued.  He has had purposeful weight loss of approximately 8 pounds.  He continues to care for his disabled wife.  He recently underwent bilateral Dupuytren contracture surgery by Dr. Durward Fortes as result of bilateral carpal tunnel syndrome.  He tolerated this well.  He continues to be on amlodipine 5 mg daily, HCTZ 12.5 mg on a as needed basis, metoprolol succinate now at the reduced dose of 100 mg in addition to ramipril 10 mg and ranolazine 1000 mg twice a day.  He is not having any anginal symptoms.  He is on rosuvastatin 20 mg for hyperlipidemia.  He presents for evaluation.  Past Medical History:  Diagnosis  Date  . Blood transfusion without reported diagnosis   . CAD (coronary artery disease)   . GERD (gastroesophageal reflux disease)   . Heart attack ([place])   . Hyperlipidemia   . Hypertension 03/07/12   ECHO-WNL     08/12/11 Lexiscan MyoviewNo significant ischemia demonstrated Low risk scan There is a moderate sized dense scar in the LCX territoy unchanged from the prior study.. Post- stress EF is 40%.  . Thyroid disease     Past Surgical History:  Procedure Laterality Date  . APPENDECTOMY    . CERVICAL SPINE SURGERY     titanium plate in the back of neck  . CORONARY ARTERY BYPASS GRAFT    . HERNIA REPAIR    . SMALL INTESTINE SURGERY    . THYROID SURGERY  1/2 thyroid removed on right side    Allergies  Allergen Reactions  . Procardia [Nifedipine] Other (See Comments)    Lowers bp   . Phenergan [Promethazine Hcl] Nausea And Vomiting    Current Outpatient Medications  Medication Sig Dispense Refill  . amLODipine (NORVASC) 5 MG tablet Take 1 tablet by mouth once daily 90 tablet 0  . aspirin EC 81 MG tablet Take 1 tablet (81 mg total) by mouth daily. 90 tablet 3  . fish oil-omega-3 fatty acids 1000 MG capsule Take 1 g by mouth daily.     . Garlic 9417 MG CAPS Take 1 capsule by mouth daily.     . hydrochlorothiazide (MICROZIDE) 12.5 MG capsule Take 1 capsule (12.5 mg total) by mouth daily as needed (fluid and edema). 90 capsule 3  . isosorbide mononitrate (IMDUR) 120 MG 24 hr tablet Take 1 tablet by mouth once daily 90 tablet 3  . lansoprazole (PREVACID) 15 MG capsule Take 15 mg by mouth daily.    Marland Kitchen levothyroxine (SYNTHROID) 100 MCG tablet TAKE 1 TABLET BY MOUTH ONCE DAILY IN THE MORNING 90 tablet 3  . levothyroxine (SYNTHROID, LEVOTHROID) 100 MCG tablet TAKE 1 TABLET BY MOUTH IN THE MORNING BEFORE BREAKFAST 90 tablet 1  . metoprolol succinate (TOPROL-XL) 100 MG 24 hr tablet Take 1 tablet (100 mg total) by mouth daily. TAKE 1 & 1/4 (ONE & ONE-FOURTH) TABLETS BY MOUTH ONCE DAILY  180 tablet 1  . nitroGLYCERIN (NITROSTAT) 0.4 MG SL tablet DISSOLVE ONE TABLET UNDER THE TONGUE EVERY 5 MINUTES AS NEEDED FOR CHEST PAIN.  DO NOT EXCEED A TOTAL OF 3 DOSES IN 15 MINUTES 25 tablet 0  . NITROSTAT 0.4 MG SL tablet DISSOLVE ONE TABLET UNDER THE TONGUE EVERY 5 MINUTES AS NEEDED FOR CHEST PAIN.  DO NOT EXCEED A TOTAL OF 3 DOSES IN 15 MINUTES 25 tablet 3  . ramipril (ALTACE) 10 MG capsule Take 1 capsule by mouth once daily 90 capsule 1  . ranolazine (RANEXA) 1000 MG SR tablet Take 1 tablet by mouth twice daily 180 tablet 1  . rosuvastatin (CRESTOR) 20 MG tablet TAKE 1 TABLET BY MOUTH ONCE DAILY IN THE EVENING 90 tablet 1   No current facility-administered medications for this visit.    Socially he is married. He does try to do some exercise. He remains relatively active with yard work. Is no tobacco or alcohol use.  ROS General: Negative; No fevers, chills, or night sweats;  HEENT: Uses a hearing aid; No changes in vision , sinus congestion, difficulty swallowing Pulmonary: Negative; No cough, wheezing, shortness of breath, hemoptysis Cardiovascular: See HPI GI: Negative; No nausea, vomiting, diarrhea, or abdominal pain GU: Negative; No dysuria, hematuria, or difficulty voiding Musculoskeletal: Negative; no myalgias, joint pain, or weakness Hematologic/Oncology: Negative; no easy bruising, bleeding Endocrine: Negative; no heat/cold intolerance; no diabetes Neuro: Negative; no changes in balance, headaches Skin: Negative; No rashes or skin lesions Psychiatric: Negative; No behavioral problems, depression Sleep: Negative; No snoring, daytime sleepiness, hypersomnolence, bruxism, restless legs, hypnogognic hallucinations, no cataplexy Other comprehensive 14 point system review is negative.   PE BP 132/70   Pulse (!) 55   Temp (!) 97 F (36.1 C)   Ht 5' 8"  (1.727 m)   Wt 179 lb (81.2 kg)   SpO2 95%   BMI 27.22 kg/m    Repeat blood pressure by me was 122/70  Wt Readings  from Last 3 Encounters:  11/23/19 179 lb (81.2 kg)  10/17/19 185 lb (83.9  kg)  08/14/19 185 lb (83.9 kg)   General: Alert, oriented, no distress.  Skin: normal turgor, no rashes, warm and dry HEENT: Normocephalic, atraumatic. Pupils equal round and reactive to light; sclera anicteric; extraocular muscles intact;  Nose without nasal septal hypertrophy Mouth/Parynx benign; Mallinpatti scale 3 Neck: No JVD, no carotid bruits; normal carotid upstroke Lungs: clear to ausculatation and percussion; no wheezing or rales Chest wall: without tenderness to palpitation Heart: PMI not displaced, RRR, s1 s2 normal, 3-1/5 systolic murmur, no diastolic murmur, no rubs, gallops, thrills, or heaves Abdomen: soft, nontender; no hepatosplenomehaly, BS+; abdominal aorta nontender and not dilated by palpation. Back: no CVA tenderness Pulses 2+ Musculoskeletal: full range of motion, normal strength, no joint deformities Extremities: no clubbing cyanosis or edema, Homan's sign negative  Neurologic: grossly nonfocal; Cranial nerves grossly wnl Psychologic: Normal mood and affect   ECG (independently read by me): Sinus bradycardia 55 bpm, LVH by voltage criteria.  Inferior Q waves consistent with old IMI.  QTc interval 461 ms.  January 2021 ECG (independently read by me): Sinus bradycardia 51 bpm.  Mild LVH.  Old inferior Q waves.  QTc interval 471 ms  March 2019 ECG (independently read by me): sinus bradycardia 50 bpm.  LVH with mild ST changes.  Old inferior infarct.  Prolonged first-degree AV block with a PR interval 2063 seconds.  QTc interval 477 ms.  August 2018 ECG (independently read by me): Sinus bradycardia 56 bpm.  Borderline LVH.  Q waves in lead 3 and aVF.  QTc interval 467 ms  January 2018 ECG (independently read by me): Sinus bradycardia 55 bpm.  Q wave in lead 3 and aVF.  Nonspecific T changes.  January 2017 ECG (independently read by me): Normal sinus rhythm at 61 bpm.  LVH by voltage.  Old  inferior MI with inferior Q waves.    January 2016 ECG (independently read by me): Sinus bradycardia 56 bpm.  Old inferior MI with Q waves and early transition, suggesting posterior wall involvement.  Nonspecific ST changes.  January 2015 ECG: Sinus rhythm at 58 beats per minute. Nonspecific T changes. Evidence for old inferior MI with Q waves in leads 3 and aVF.  LABS: BMP Latest Ref Rng & Units 06/18/2017 04/29/2016 04/17/2015  Glucose 65 - 99 mg/dL 103(H) 114(H) 111(H)  BUN 8 - 27 mg/dL 17 14 16   Creatinine 0.76 - 1.27 mg/dL 1.06 0.83 1.00  BUN/Creat Ratio 10 - 24 16 - -  Sodium 134 - 144 mmol/L 143 143 140  Potassium 3.5 - 5.2 mmol/L 4.0 4.2 4.0  Chloride 96 - 106 mmol/L 102 106 105  CO2 20 - 29 mmol/L 25 29 28   Calcium 8.6 - 10.2 mg/dL 9.2 9.0 8.7   Hepatic Function Latest Ref Rng & Units 06/18/2017 04/29/2016 04/17/2015  Total Protein 6.0 - 8.5 g/dL 7.4 6.8 6.3  Albumin 3.5 - 4.8 g/dL 4.1 3.5(L) 3.7  AST 0 - 40 IU/L 21 24 16   ALT 0 - 44 IU/L 22 34 20  Alk Phosphatase 39 - 117 IU/L 54 53 47  Total Bilirubin 0.0 - 1.2 mg/dL 0.9 0.9 0.8   CBC Latest Ref Rng & Units 06/18/2017 04/29/2016 04/17/2015  WBC 3.4 - 10.8 x10E3/uL 7.3 7.3 6.4  Hemoglobin 13.0 - 17.7 g/dL 13.6 13.4 13.5  Hematocrit 37.5 - 51.0 % 41.7 40.7 40.0  Platelets 150 - 379 x10E3/uL 156 196 147(L)   Lab Results  Component Value Date   MCV 93 06/18/2017   MCV 90.8  04/29/2016   MCV 92.2 04/17/2015   Lab Results  Component Value Date   HGBA1C 6.1 (A) 08/14/2019    Lab Results  Component Value Date   TSH 0.895 06/18/2017   Lipid Panel     Component Value Date/Time   CHOL 141 06/18/2017 1233   TRIG 137 06/18/2017 1233   HDL 44 06/18/2017 1233   CHOLHDL 3.2 06/18/2017 1233   CHOLHDL 4.1 04/29/2016 1213   VLDL 50 (H) 04/29/2016 1213   LDLCALC 70 06/18/2017 1233    RADIOLOGY: No results found.  IMPRESSION:  1. Coronary artery disease involving coronary bypass graft of native heart with angina pectoris  ([place])   2. Hx of CABG   3. Essential hypertension   4. Mixed hyperlipidemia   5. Hyperlipidemia with target LDL less than 70   6. Hypothyroidism, unspecified type     ASSESSMENT AND PLAN: Ms. Bury is a 79 year-old Caucasian male who suffered an  inferior wall myocardial infarction 29 years ago in 1992 and underwent initial PTCA.  Due to progressive CAD he underwent CABG revascularization surgery 1993. He has documented RCA graft occlusion with good left-to-right collaterals. Since initiating Ranexa, this has made of remarkable difference in his symptomatology.  Presently he is essentially without anginal symptoms.  Since his last evaluation, he has not had any anginal symptoms.  He continues to be on ranolazine 1000 mg twice a day, metoprolol succinate 100 mg daily, isosorbide 120 mg as well as amlodipine 5 mg for his CAD and also has a prescription for HCTZ 12.5 mg which he takes as needed for leg edema and he also takes ramipril 10 mg for additional blood pressure control.  He is on levothyroxine 100 mcg for hypothyroidism.  He is tolerating rosuvastatin 20 mg for hyperlipidemia with target LDL less than 70.  He has not had recent lipid studies.  I am recommending fasting chemistry profile CBC lipid studies and TSH level.  He recently underwent Dupuytren contracture surgery by Dr. Durward Fortes.  I will contact him regarding his laboratory.  I will see him in 1 year for reevaluation or sooner as needed.  Troy Sine, MD, [facility]  11/25/2019 4:49 PM

## 2019-11-25 ENCOUNTER — Encounter: Payer: Self-pay | Admitting: Cardiovascular Disease

## 2019-11-28 NOTE — Addendum Note (Signed)
Addended by: Zebedee Iba on: 11/28/2019 10:36 AM   Modules accepted: Orders

## 2020-01-17 ENCOUNTER — Ambulatory Visit (INDEPENDENT_AMBULATORY_CARE_PROVIDER_SITE_OTHER): Payer: PPO | Admitting: Family Medicine

## 2020-01-17 ENCOUNTER — Other Ambulatory Visit: Payer: Self-pay

## 2020-01-17 DIAGNOSIS — Z23 Encounter for immunization: Secondary | ICD-10-CM | POA: Diagnosis not present

## 2020-01-17 NOTE — Progress Notes (Signed)
Patient received high dose flu shot, FLUAD , in R deltoid , pt tolerated it well.

## 2020-02-02 ENCOUNTER — Other Ambulatory Visit: Payer: Self-pay | Admitting: Cardiovascular Disease

## 2020-02-09 ENCOUNTER — Other Ambulatory Visit: Payer: Self-pay | Admitting: Cardiovascular Disease

## 2020-03-06 ENCOUNTER — Other Ambulatory Visit: Payer: Self-pay | Admitting: Cardiovascular Disease

## 2020-04-17 ENCOUNTER — Ambulatory Visit: Payer: PPO | Admitting: Orthopaedic Surgery

## 2020-04-17 ENCOUNTER — Other Ambulatory Visit: Payer: Self-pay

## 2020-04-17 ENCOUNTER — Encounter: Payer: Self-pay | Admitting: Orthopaedic Surgery

## 2020-04-17 DIAGNOSIS — M65311 Trigger thumb, right thumb: Secondary | ICD-10-CM

## 2020-04-17 MED ORDER — BUPIVACAINE HCL 0.5 % IJ SOLN
0.5000 mL | INTRAMUSCULAR | Status: AC | PRN
  Administered 2020-04-17: .5 mL

## 2020-04-17 NOTE — Progress Notes (Signed)
Office Visit Note   Patient: Bradley Oconnor           Date of Birth: 1940-08-31           MRN: [id number] Visit Date: 04/17/2020              Requested by: Wendie Agreste, MD [address] [place],  [place] [address] PCP: Wendie Agreste, MD   Assessment & Plan: Visit Diagnoses:  1. Trigger finger of right thumb     Plan: Right trigger thumb.  Long discussion regarding treatment options including surgery.  He like to try cortisone injection and splint.  We will plan to see him back as needed. 46-month status post right carpal tunnel release and doing very well without a problem.  Having minimal symptoms from his left carpal tunnel.  No treatment necessary  Follow-Up Instructions: Return if symptoms worsen or fail to improve.   Orders:  Orders Placed This Encounter  Procedures  . Hand/UE Inj: R thumb A1   No orders of the defined types were placed in this encounter.     Procedures: Hand/UE Inj: R thumb A1 for trigger finger on 04/17/2020 10:38 AM Medications: 0.5 mL bupivacaine 0.5 %  1/2 mL betamethasone injected with Marcaine into the A1 pulley right thumb      Clinical Data: No additional findings.   Subjective: Chief Complaint  Patient presents with  . Right Hand - Pain  Patient presents today for his right thumb. He states that the over the last two months his right thumb sticks and is painful. It is getting worse. He has a history of right carpal tunnel release in July of 2021. His left hand is doing well. He is right hand dominant.  HPI  Review of Systems   Objective: Vital Signs: Ht 5\' 8"  (1.727 m)   Wt 179 lb (81.2 kg)   BMI 27.22 kg/m   Physical Exam Constitutional:      Appearance: He is well-developed and well-nourished.  HENT:     Mouth/Throat:     Mouth: Oropharynx is clear and moist.  Eyes:     Extraocular Movements: EOM normal.     Pupils: Pupils are equal, round, and reactive to light.  Pulmonary:     Effort: Pulmonary  effort is normal.  Skin:    General: Skin is warm and dry.  Neurological:     Mental Status: He is alert and oriented to person, place, and time.  Psychiatric:        Mood and Affect: Mood and affect normal.        Behavior: Behavior normal.     Ortho Exam right carpal tunnel incision is healed without any issues.  Negative Tinel's.  Normal sensation to all digits.  Has active triggering of his right thumb with painful nodule over the A1 pulley the palmar aspect of the thumb.  Specialty Comments:  No specialty comments available.  Imaging: No results found.   PMFS History: Patient Active Problem List   Diagnosis Date Noted  . Trigger finger of right thumb 04/17/2020  . Carpal tunnel syndrome, bilateral 09/26/2019  . Shoulder pain, bilateral 08/03/2019  . Cervicalgia 03/28/2018  . GERD (gastroesophageal reflux disease) 04/10/2013  . HTN (hypertension) 04/10/2013  . CAD (coronary artery disease) of artery bypass graft 10/04/2012  . Hypothyroidism 10/04/2012  . Hyperlipidemia with target LDL less than 70 10/04/2012  . Kidney stones 01/27/2012  . Gallstones-symptomatic 01/27/2012  . Hx of appendectomy-history of ruptured  requiring ileocecectomy [phone]) 01/27/2012   Past Medical History:  Diagnosis Date  . Blood transfusion without reported diagnosis   . CAD (coronary artery disease)   . GERD (gastroesophageal reflux disease)   . Heart attack ([place])   . Hyperlipidemia   . Hypertension 03/07/12   ECHO-WNL     08/12/11 Lexiscan MyoviewNo significant ischemia demonstrated Low risk scan There is a moderate sized dense scar in the LCX territoy unchanged from the prior study.. Post- stress EF is 40%.  . Thyroid disease     Family History  Problem Relation Age of Onset  . Heart disease Mother   . Cancer Mother        breast, stomach  . Heart disease Father   . Hyperlipidemia Father   . Heart disease Brother   . Diabetes Brother   . Hyperlipidemia Brother   . Heart disease  Paternal Grandfather   . Healthy Brother     Past Surgical History:  Procedure Laterality Date  . APPENDECTOMY    . CERVICAL SPINE SURGERY     titanium plate in the back of neck  . CORONARY ARTERY BYPASS GRAFT    . HERNIA REPAIR    . SMALL INTESTINE SURGERY    . 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 Occupation: Retired  Tobacco Use  . Smoking status: Never Smoker  . Smokeless tobacco: Never Used  Vaping Use  . Vaping Use: Never used  Substance and Sexual Activity  . Alcohol use: No    Alcohol/week: 0.0 standard drinks  . Drug use: No  . Sexual activity: Never

## 2020-06-04 DIAGNOSIS — H5213 Myopia, bilateral: Secondary | ICD-10-CM | POA: Diagnosis not present

## 2020-06-04 DIAGNOSIS — Z961 Presence of intraocular lens: Secondary | ICD-10-CM | POA: Diagnosis not present

## 2020-08-07 ENCOUNTER — Ambulatory Visit (INDEPENDENT_AMBULATORY_CARE_PROVIDER_SITE_OTHER): Payer: PPO

## 2020-08-07 ENCOUNTER — Encounter: Payer: Self-pay | Admitting: Orthopaedic Surgery

## 2020-08-07 ENCOUNTER — Other Ambulatory Visit: Payer: Self-pay

## 2020-08-07 ENCOUNTER — Ambulatory Visit: Payer: PPO | Admitting: Orthopaedic Surgery

## 2020-08-07 VITALS — Ht 68.0 in | Wt 179.0 lb

## 2020-08-07 DIAGNOSIS — M5442 Lumbago with sciatica, left side: Secondary | ICD-10-CM

## 2020-08-07 DIAGNOSIS — M65311 Trigger thumb, right thumb: Secondary | ICD-10-CM

## 2020-08-07 DIAGNOSIS — M5441 Lumbago with sciatica, right side: Secondary | ICD-10-CM

## 2020-08-07 DIAGNOSIS — G8929 Other chronic pain: Secondary | ICD-10-CM

## 2020-08-07 DIAGNOSIS — M545 Low back pain, unspecified: Secondary | ICD-10-CM | POA: Insufficient documentation

## 2020-08-07 NOTE — Progress Notes (Signed)
Office Visit Note   Patient: Bradley Oconnor           Date of Birth: [birth year]           MRN: [id number] Visit Date: 08/07/2020              Requested by: Wendie Agreste, MD [address] [place],  [place] [address] PCP: Wendie Agreste, MD   Assessment & Plan: Visit Diagnoses:  1. Chronic bilateral low back pain with bilateral sciatica   2. Trigger finger of right thumb     Plan: Mr. Harshberger is still having an issue with his right thumb with active triggering.  He is had a cortisone injection which "helped a little bit".  After much discussion he like to proceed with trigger finger release.  He has had some issues with that thumb in the past from an old fracture and is developed some arthritic changes at the thumb metacarpal phalangeal joint that may impact his overall results.  But I did discuss the outpatient nature of the incision what he can expect postoperatively.  Hopefully this will make a big difference in terms of his pain and triggering but he still may have some residual problems from the prior injury. Has significant degenerative changes in the lumbar spine that I suspect are responsible for the bilateral hip pain when he sits or stands for a length of time.  He certainly could have spinal stenosis.  We will order an MRI scan of his lumbar spine and an ultrasound of his aorta  Follow-Up Instructions: Return We will schedule right trigger thumb release.  MRI of lumbar spine and ultrasound of aorta.   Orders:  Orders Placed This Encounter  Procedures  . XR Lumbar Spine 2-3 Views  . MR Lumbar Spine w/o contrast  . US AORTA   No orders of the defined types were placed in this encounter.     Procedures: No procedures performed   Clinical Data: No additional findings.   Subjective: Chief Complaint  Patient presents with  . Right Hand - Follow-up    Trigger thumb  Patient presents today for follow up on his right trigger thumb. He had it injected with  cortisone in January. He said that it helped, and is still doing better than before the injection. He does have a lot of soreness and difficulty using that hand. He is right hand dominant.  Also relates that he has a problem with his back and bilateral hip pain the longer he stands of the further he walks.  Sometimes he has a problem when he sits for any length of time.  He is never had his back evaluated.  HPI  Review of Systems   Objective: Vital Signs: Ht 5\' 8"  (1.727 m)   Wt 179 lb (81.2 kg)   BMI 27.22 kg/m   Physical Exam Constitutional:      Appearance: He is well-developed.  Eyes:     Pupils: Pupils are equal, round, and reactive to light.  Pulmonary:     Effort: Pulmonary effort is normal.  Skin:    General: Skin is warm and dry.  Neurological:     Mental Status: He is alert and oriented to person, place, and time.  Psychiatric:        Behavior: Behavior normal.     Ortho Exam right thumb with active triggering and a painful nodule on the palmar aspect of the thumb at the level of the  metacarpal phalangeal joint.  Also has some hypertrophic changes about the metacarpal phalangeal joint from an old injury.  Has good sensation and good capillary refill. Straight leg raise negative.  No percussible tenderness of the lumbar spine.  No pain over either greater trochanter or loss of motion or pain with range of motion of his hips  Specialty Comments:  No specialty comments available.  Imaging: XR Lumbar Spine 2-3 Views  Result Date: 08/07/2020 Films of the lumbar spine were obtained in several projections.  There is mild left lumbar scoliosis of less than 10 degrees.  There are significant degenerative changes in the facet joints.  No obvious compression fractures but there is some endplate narrowing in the upper lumbar spine and T12.  Diffuse calcification of the abdominal aorta.  I measured the widest diameter at about 30 mm.    PMFS History: Patient Active Problem  List   Diagnosis Date Noted  . Low back pain 08/07/2020  . Trigger finger of right thumb 04/17/2020  . Carpal tunnel syndrome, bilateral 09/26/2019  . Shoulder pain, bilateral 08/03/2019  . Cervicalgia 03/28/2018  . GERD (gastroesophageal reflux disease) 04/10/2013  . HTN (hypertension) 04/10/2013  . CAD (coronary artery disease) of artery bypass graft 10/04/2012  . Hypothyroidism 10/04/2012  . Hyperlipidemia with target LDL less than 70 10/04/2012  . Kidney stones 01/27/2012  . Gallstones-symptomatic 01/27/2012  . Hx of appendectomy-history of ruptured requiring ileocecectomy (~1994) 01/27/2012   Past Medical History:  Diagnosis Date  . Blood transfusion without reported diagnosis   . CAD (coronary artery disease)   . GERD (gastroesophageal reflux disease)   . Heart attack ([place])   . Hyperlipidemia   . Hypertension 03/07/12   ECHO-WNL     08/12/11 Lexiscan MyoviewNo significant ischemia demonstrated Low risk scan There is a moderate sized dense scar in the LCX territoy unchanged from the prior study.. Post- stress EF is 40%.  . Thyroid disease     Family History  Problem Relation Age of Onset  . Heart disease Mother   . Cancer Mother        breast, stomach  . Heart disease Father   . Hyperlipidemia Father   . Heart disease Brother   . Diabetes Brother   . Hyperlipidemia Brother   . Heart disease Paternal Grandfather   . Healthy Brother     Past Surgical History:  Procedure Laterality Date  . APPENDECTOMY    . CERVICAL SPINE SURGERY     titanium plate in the back of neck  . CORONARY ARTERY BYPASS GRAFT    . HERNIA REPAIR    . SMALL INTESTINE SURGERY    . 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 Occupation: Retired  Tobacco Use  . Smoking status: Never Smoker  . Smokeless tobacco: Never Used  Vaping Use  . Vaping Use: Never used  Substance and Sexual Activity  . Alcohol use: No    Alcohol/week: 0.0  standard drinks  . Drug use: No  . Sexual activity: Never

## 2020-08-23 ENCOUNTER — Other Ambulatory Visit: Payer: Self-pay

## 2020-08-23 ENCOUNTER — Ambulatory Visit
Admission: RE | Admit: 2020-08-23 | Discharge: 2020-08-23 | Disposition: A | Discharge status: Home / Self Care | Payer: PPO | Source: Ambulatory Visit | Attending: Orthopaedic Surgery | Admitting: Orthopaedic Surgery

## 2020-08-23 DIAGNOSIS — M545 Low back pain, unspecified: Secondary | ICD-10-CM | POA: Diagnosis not present

## 2020-08-23 DIAGNOSIS — M48061 Spinal stenosis, lumbar region without neurogenic claudication: Secondary | ICD-10-CM | POA: Diagnosis not present

## 2020-08-23 DIAGNOSIS — G8929 Other chronic pain: Secondary | ICD-10-CM

## 2020-09-08 ENCOUNTER — Other Ambulatory Visit: Payer: Self-pay | Admitting: Cardiovascular Disease

## 2020-09-12 ENCOUNTER — Encounter: Payer: PPO | Admitting: Orthopaedic Surgery

## 2020-09-19 DIAGNOSIS — M65311 Trigger thumb, right thumb: Secondary | ICD-10-CM | POA: Diagnosis not present

## 2020-09-23 DIAGNOSIS — L570 Actinic keratosis: Secondary | ICD-10-CM | POA: Diagnosis not present

## 2020-09-23 DIAGNOSIS — C44319 Basal cell carcinoma of skin of other parts of face: Secondary | ICD-10-CM | POA: Diagnosis not present

## 2020-09-24 ENCOUNTER — Encounter: Payer: PPO | Admitting: Orthopaedic Surgery

## 2020-09-26 ENCOUNTER — Encounter: Payer: PPO | Admitting: Orthopaedic Surgery

## 2020-10-05 ENCOUNTER — Other Ambulatory Visit: Payer: Self-pay | Admitting: Cardiovascular Disease

## 2020-10-10 ENCOUNTER — Other Ambulatory Visit: Payer: Self-pay

## 2020-10-10 ENCOUNTER — Ambulatory Visit (INDEPENDENT_AMBULATORY_CARE_PROVIDER_SITE_OTHER): Payer: PPO | Admitting: Orthopaedic Surgery

## 2020-10-10 ENCOUNTER — Encounter: Payer: Self-pay | Admitting: Orthopaedic Surgery

## 2020-10-10 VITALS — Ht 68.0 in | Wt 179.0 lb

## 2020-10-10 DIAGNOSIS — M65311 Trigger thumb, right thumb: Secondary | ICD-10-CM

## 2020-10-10 NOTE — Progress Notes (Signed)
Office Visit Note   Patient: Bradley Oconnor           Date of Birth: [birth year]           MRN: [id number] Visit Date: 10/10/2020              Requested by: Wendie Agreste, MD [address] [place],  [place] [address] PCP: Wendie Agreste, MD   Assessment & Plan: Visit Diagnoses:  1. Trigger finger of right thumb     Plan: 3 weeks status post right trigger finger release and just returning for follow-up evaluation.  He cares for his wife and has had difficulty getting to the office.  He notes he is doing well he has had 1 episode of very transient triggering of the thumb but otherwise doing very well.  We will plan to see him back as needed  Follow-Up Instructions: Return if symptoms worsen or fail to improve.   Orders:  No orders of the defined types were placed in this encounter.  No orders of the defined types were placed in this encounter.     Procedures: No procedures performed   Clinical Data: No additional findings.   Subjective: Chief Complaint  Patient presents with   Right Hand - Follow-up    Right thumb trigger finger release   Patient presents today for follow up on his right hand. He had right trigger thumb release three weeks ago. He is doing well.   HPI  Review of Systems   Objective: Vital Signs: Ht 5\' 8"  (1.727 m)   Wt 179 lb (81.2 kg)   BMI 27.22 kg/m   Physical Exam  Ortho Exam awake alert and oriented x3.  Comfortable sitting.  Remaining stitches from the right trigger thumb release removed.  Neurologically intact.  No swelling of the finger.  Little bit of induration about the incisional area but no particular pain.  He could not actively trigger the finger.  Specialty Comments:  No specialty comments available.  Imaging: No results found.   PMFS History: Patient Active Problem List   Diagnosis Date Noted   Low back pain 08/07/2020   Trigger finger of right thumb 04/17/2020   Carpal tunnel syndrome, bilateral  09/26/2019   Shoulder pain, bilateral 08/03/2019   Cervicalgia 03/28/2018   GERD (gastroesophageal reflux disease) 04/10/2013   HTN (hypertension) 04/10/2013   CAD (coronary artery disease) of artery bypass graft 10/04/2012   Hypothyroidism 10/04/2012   Hyperlipidemia with target LDL less than 70 10/04/2012   Kidney stones 01/27/2012   Gallstones-symptomatic 01/27/2012   Hx of appendectomy-history of ruptured requiring ileocecectomy (~1994) 01/27/2012   Past Medical History:  Diagnosis Date   Blood transfusion without reported diagnosis    CAD (coronary artery disease)    GERD (gastroesophageal reflux disease)    Heart attack ([place])    Hyperlipidemia    Hypertension 03/07/12   ECHO-WNL     08/12/11 Lexiscan MyoviewNo significant ischemia demonstrated Low risk scan There is a moderate sized dense scar in the LCX territoy unchanged from the prior study.. Post- stress EF is 40%.   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

## 2020-10-21 ENCOUNTER — Other Ambulatory Visit: Payer: Self-pay | Admitting: Cardiovascular Disease

## 2020-10-28 DIAGNOSIS — Z85828 Personal history of other malignant neoplasm of skin: Secondary | ICD-10-CM | POA: Diagnosis not present

## 2020-10-28 DIAGNOSIS — C44319 Basal cell carcinoma of skin of other parts of face: Secondary | ICD-10-CM | POA: Diagnosis not present

## 2020-11-13 ENCOUNTER — Other Ambulatory Visit: Payer: Self-pay | Admitting: Cardiovascular Disease

## 2020-11-29 ENCOUNTER — Telehealth: Payer: Self-pay | Admitting: Cardiovascular Disease

## 2020-11-29 MED ORDER — LEVOTHYROXINE SODIUM 100 MCG PO TABS: 100.0000 ug | ORAL_TABLET | Freq: Every morning | ORAL | 0 refills | Status: DC

## 2020-11-29 NOTE — Telephone Encounter (Signed)
*  STAT* If patient is at the pharmacy, call can be transferred to refill team.   1. Which medications need to be refilled? (please list name of each medication and dose if known)  levothyroxine (SYNTHROID) 100 MCG tablet  2. Which pharmacy/location (including street and city if local pharmacy) is medication to be sent to? [facility], [place]  3. Do they need a 30 day or 90 day supply? 90 day supply

## 2020-11-29 NOTE — Telephone Encounter (Signed)
Refills has been sent to the pharmacy. 

## 2021-01-16 ENCOUNTER — Other Ambulatory Visit: Payer: Self-pay | Admitting: Cardiovascular Disease

## 2021-02-11 IMAGING — DX DG SHOULDER 2+V*R*
2 series · 2 of 2 positions shown · non-contrast
Comparison: None.

CLINICAL DATA: Shoulder pain.  Fall.

EXAM:
RIGHT SHOULDER - 2+ VIEW

[shoulder ap]
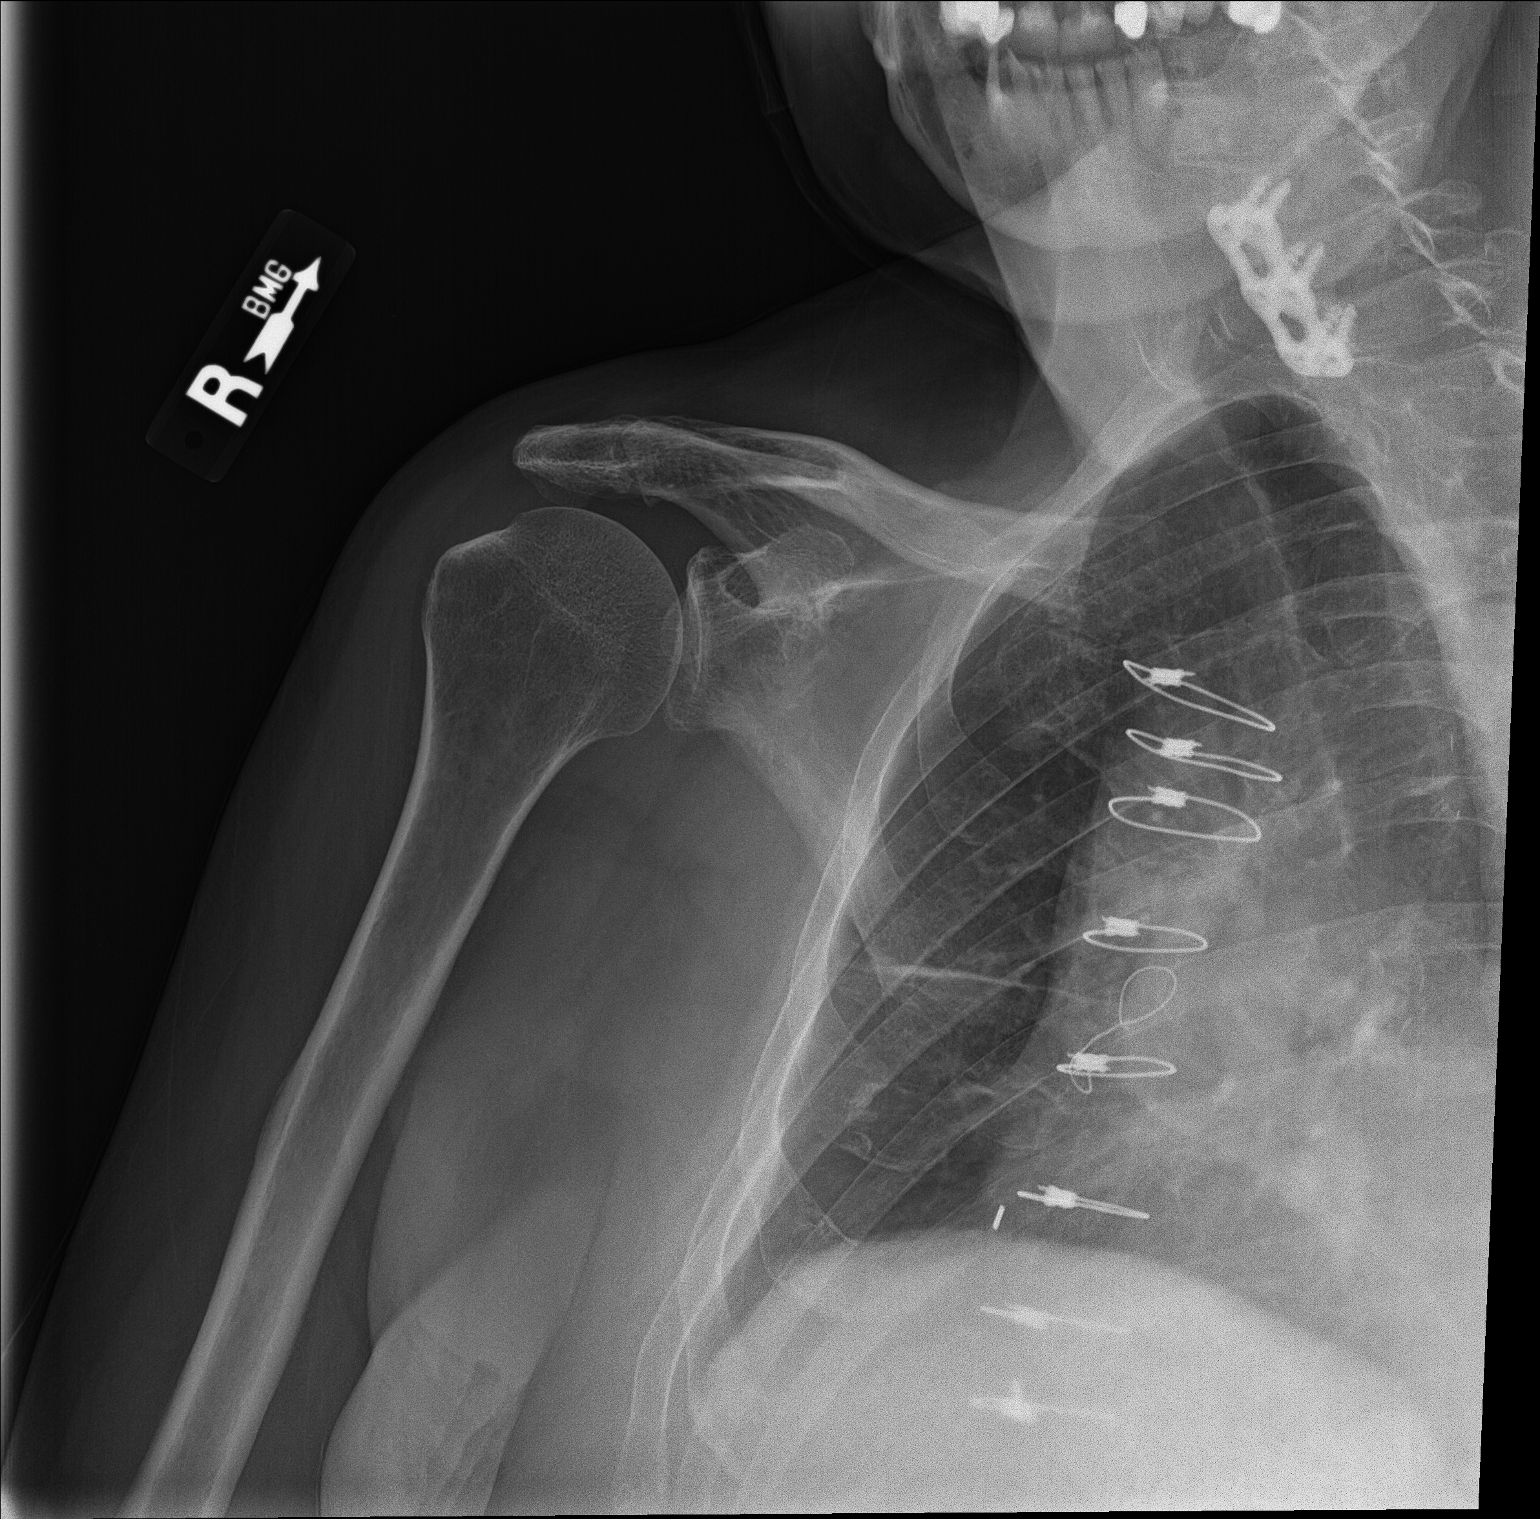

[shoulder y-view]
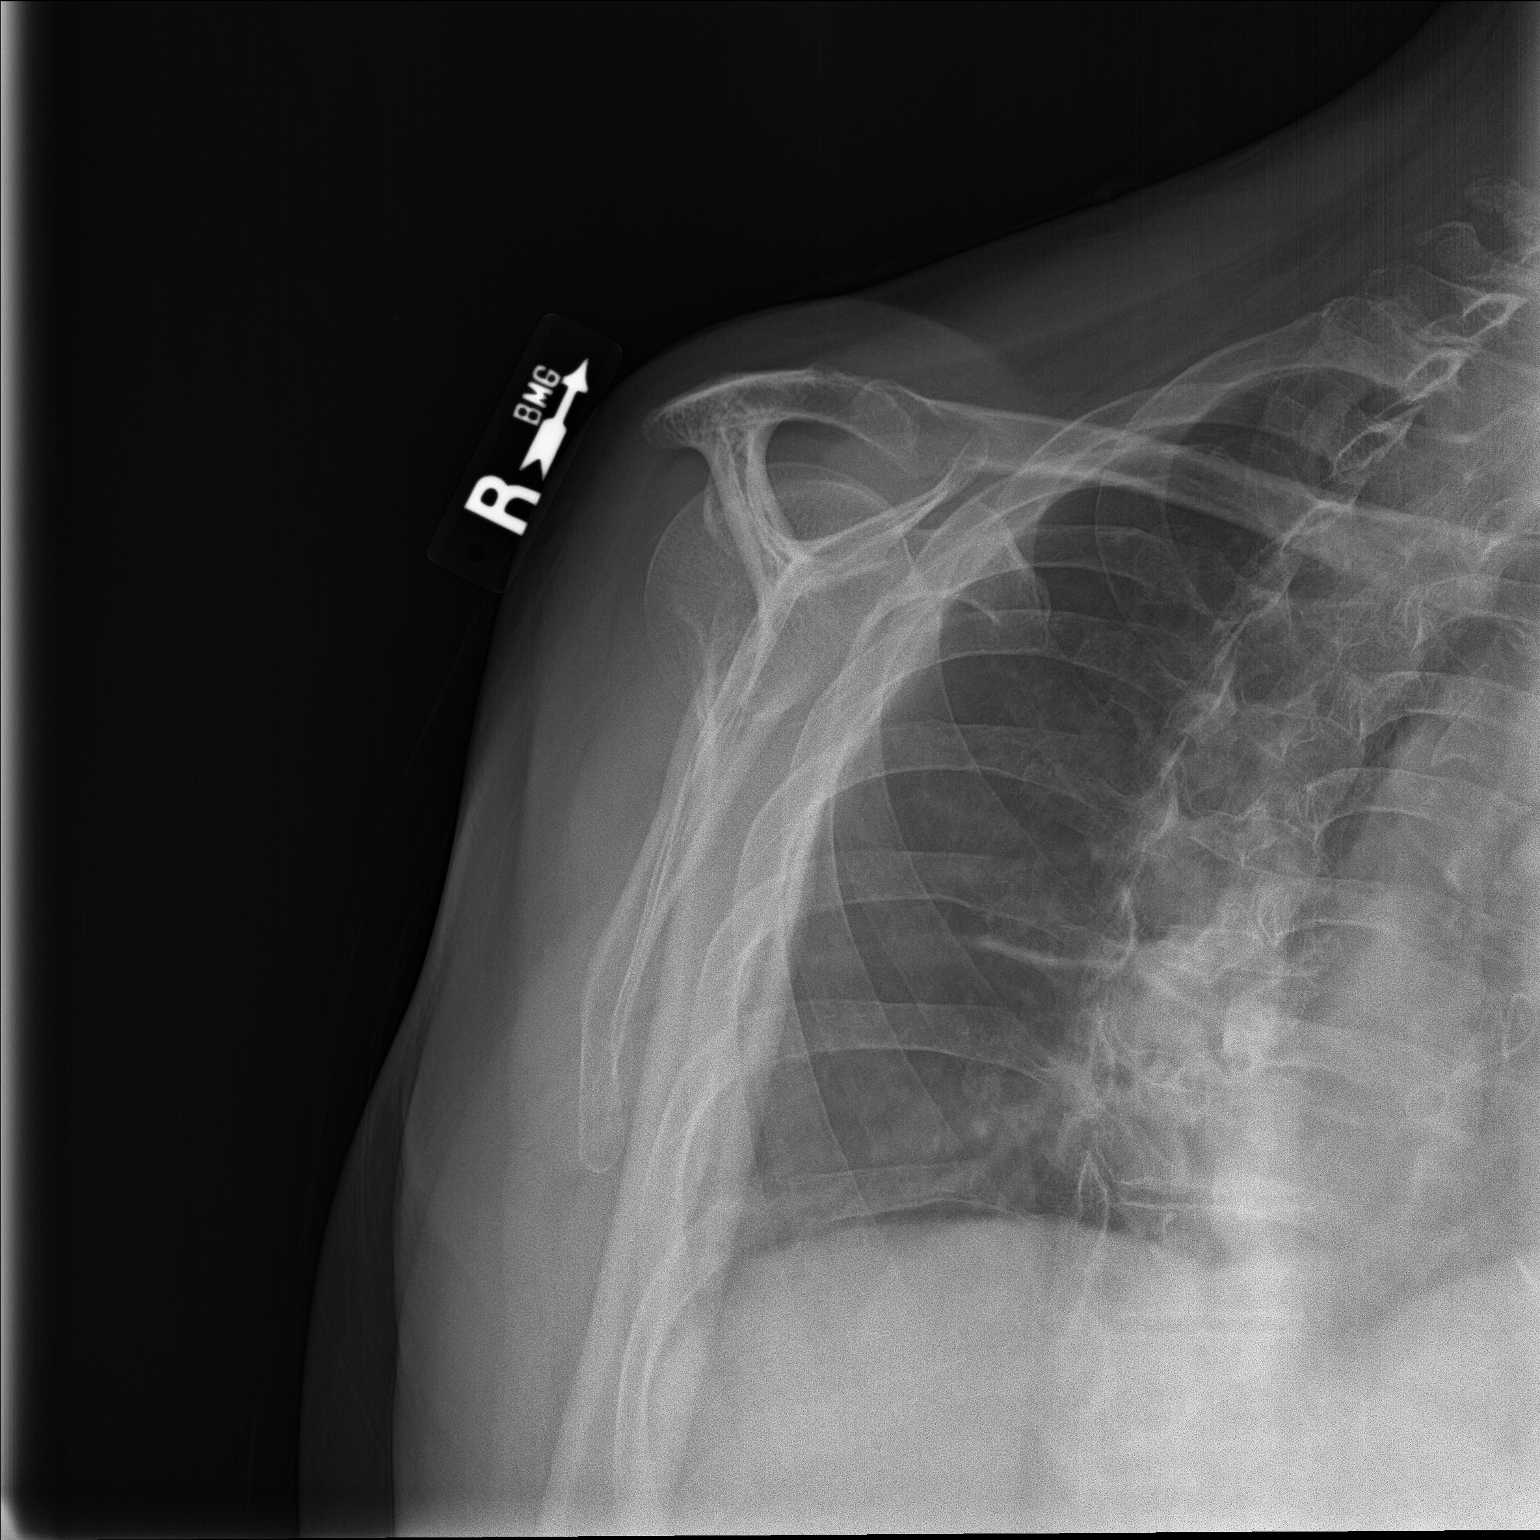

[2 of 2 positions shown; findings below may reference images not displayed]

FINDINGS: There is no evidence of fracture or dislocation. There is no
evidence of arthropathy or other focal bone abnormality. Soft
tissues are unremarkable.
IMPRESSION: Negative.

## 2021-02-11 IMAGING — DX DG SHOULDER 2+V*L*
2 series · 2 of 2 positions shown · non-contrast
Comparison: 12/22/2016

CLINICAL DATA: Fall.  Pain.

EXAM:
LEFT SHOULDER - 2+ VIEW

[shoulder ap]
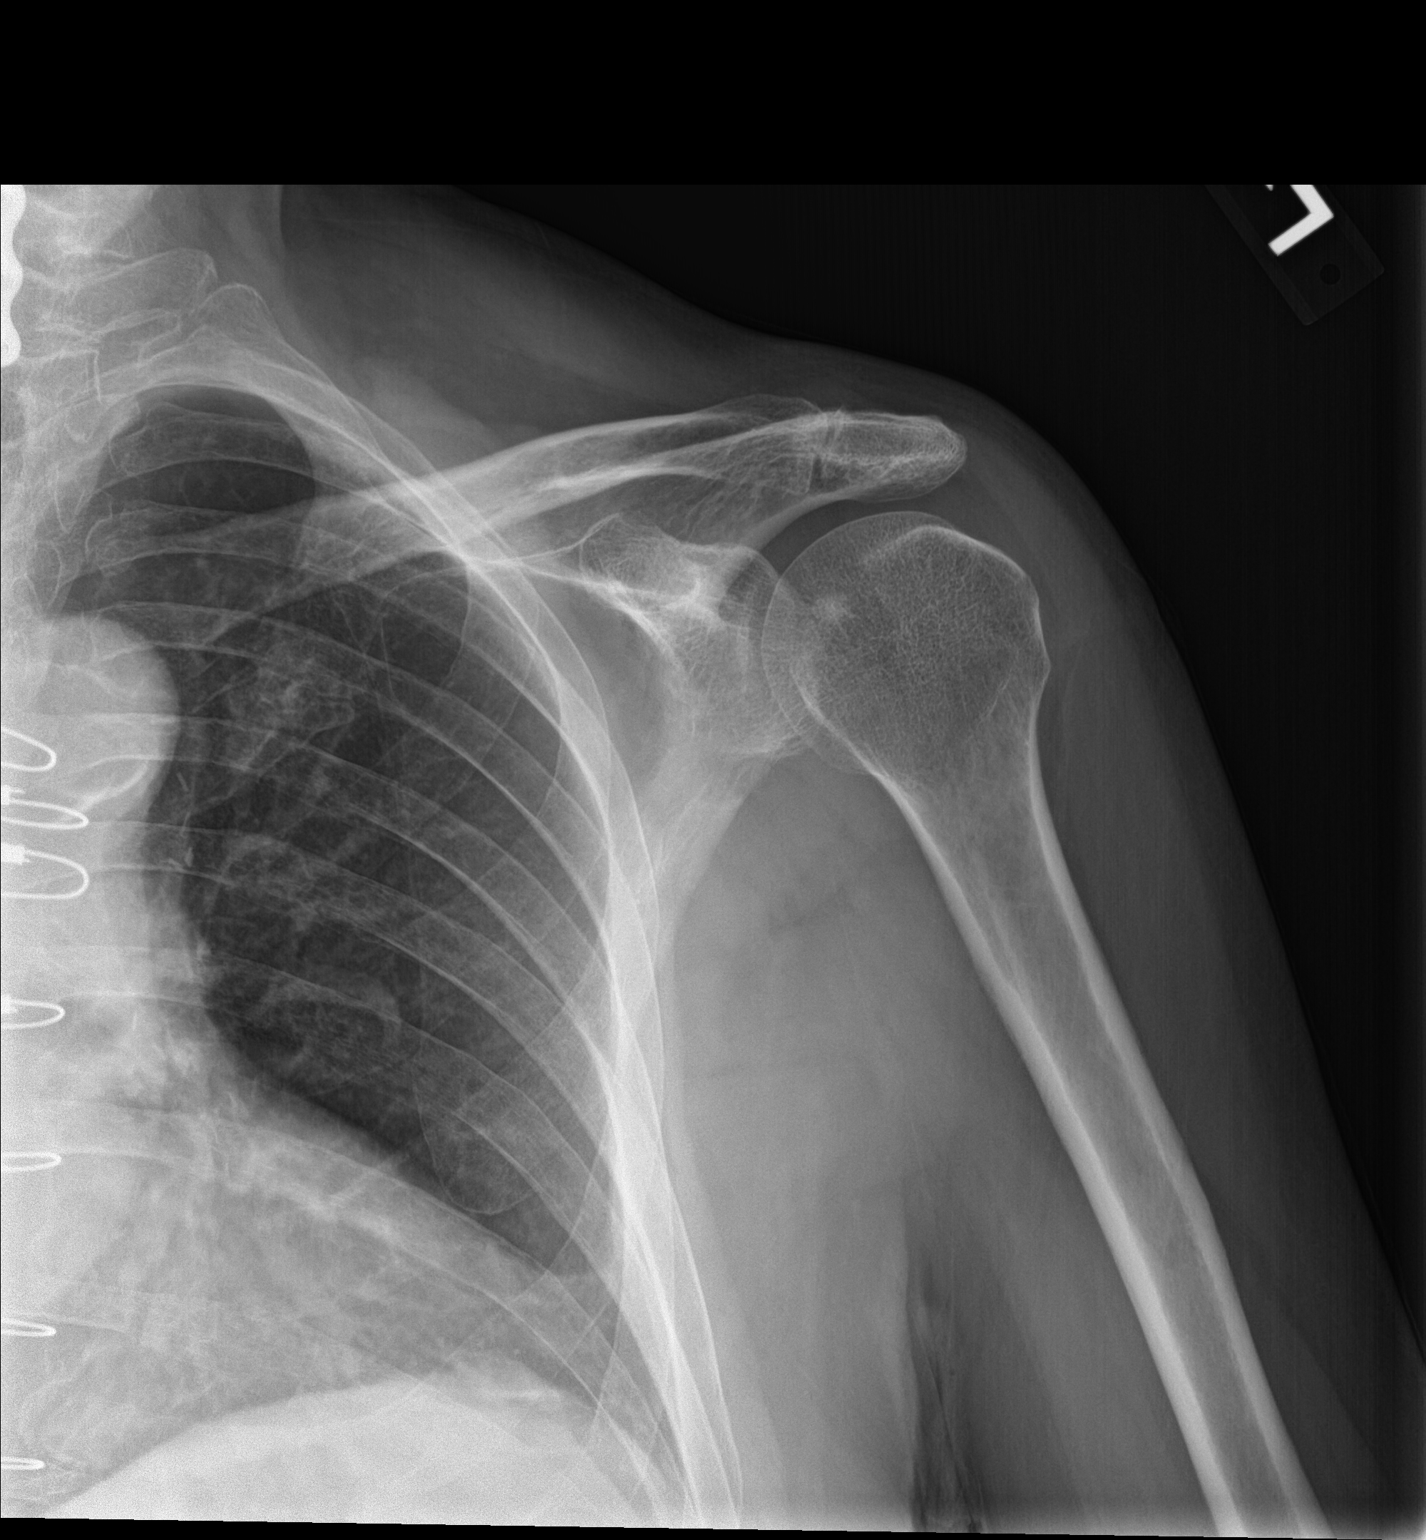

[shoulder y-view]
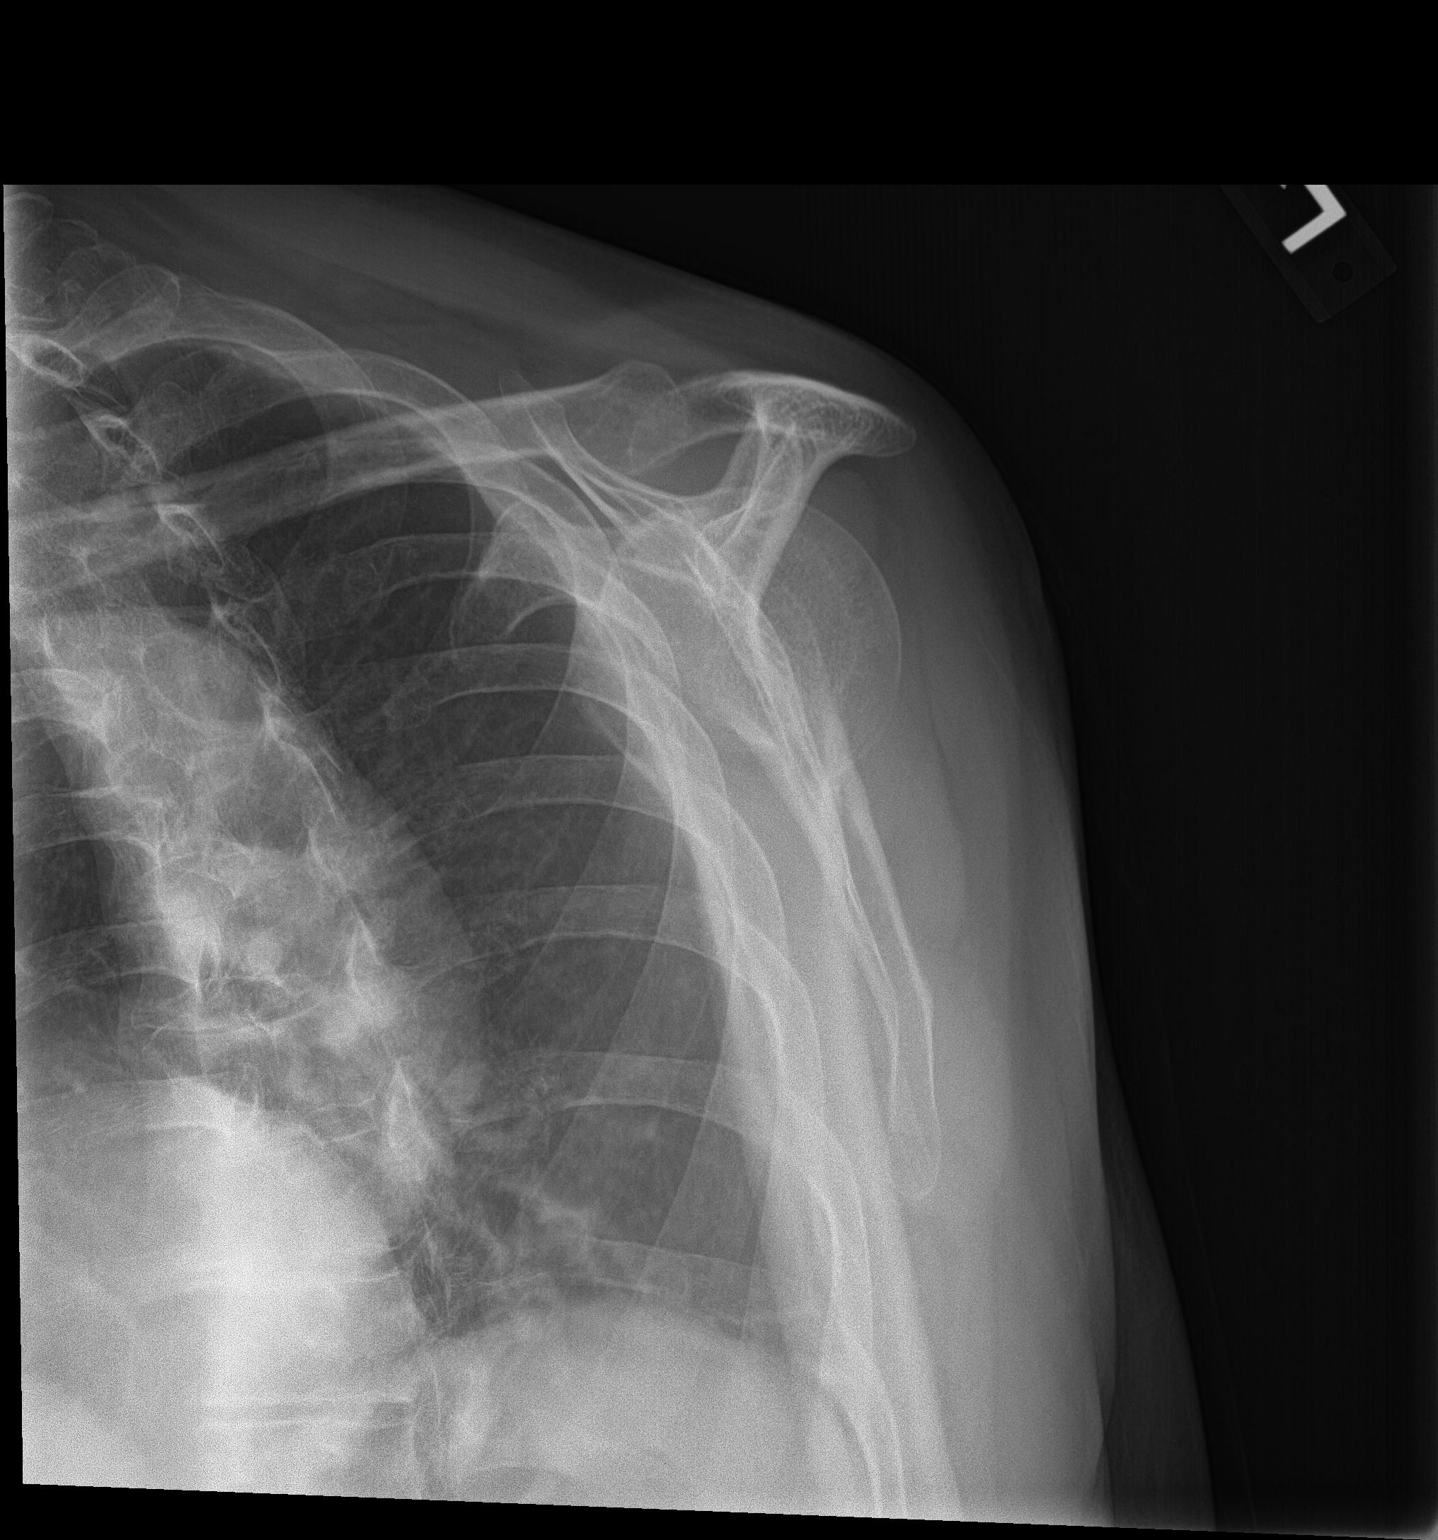

[2 of 2 positions shown; findings below may reference images not displayed]

FINDINGS: There is no evidence of fracture or dislocation. There is no
evidence of arthropathy or other focal bone abnormality. Soft
tissues are unremarkable.
IMPRESSION: Negative.

## 2021-03-03 ENCOUNTER — Ambulatory Visit (INDEPENDENT_AMBULATORY_CARE_PROVIDER_SITE_OTHER): Payer: PPO | Admitting: Family Medicine

## 2021-03-03 VITALS — BP 130/76 | HR 63 | Temp 98.1°F | Resp 16 | Ht 68.0 in | Wt 185.4 lb

## 2021-03-03 DIAGNOSIS — E785 Hyperlipidemia, unspecified: Secondary | ICD-10-CM

## 2021-03-03 DIAGNOSIS — I25709 Atherosclerosis of coronary artery bypass graft(s), unspecified, with unspecified angina pectoris: Secondary | ICD-10-CM

## 2021-03-03 DIAGNOSIS — E039 Hypothyroidism, unspecified: Secondary | ICD-10-CM

## 2021-03-03 DIAGNOSIS — R739 Hyperglycemia, unspecified: Secondary | ICD-10-CM | POA: Diagnosis not present

## 2021-03-03 DIAGNOSIS — I10 Essential (primary) hypertension: Secondary | ICD-10-CM

## 2021-03-03 DIAGNOSIS — R2689 Other abnormalities of gait and mobility: Secondary | ICD-10-CM

## 2021-03-03 LAB — COMPREHENSIVE METABOLIC PANEL
ALT: 12 U/L (ref 0–53)
AST: 14 U/L (ref 0–37)
Albumin: 4 g/dL (ref 3.5–5.2)
Alkaline Phosphatase: 46 U/L (ref 39–117)
BUN: 16 mg/dL (ref 6–23)
CO2: 32 mEq/L (ref 19–32)
Calcium: 8.7 mg/dL (ref 8.4–10.5)
Chloride: 102 mEq/L (ref 96–112)
Creatinine, Ser: 1.1 mg/dL (ref 0.40–1.50)
GFR: 62.93 mL/min (ref >60.00)
Glucose, Bld: 104 mg/dL — ABNORMAL HIGH (ref 70–99)
Potassium: 3.1 mEq/L — ABNORMAL LOW (ref 3.5–5.1)
Sodium: 144 mEq/L (ref 135–145)
Total Bilirubin: 1.2 mg/dL (ref 0.2–1.2)
Total Protein: 6.9 g/dL (ref 6.0–8.3)

## 2021-03-03 LAB — CBC
HCT: 38.9 % — ABNORMAL LOW (ref 39.0–52.0)
Hemoglobin: 13 g/dL (ref 13.0–17.0)
MCHC: 33.5 g/dL (ref 30.0–36.0)
MCV: 92.5 fl (ref 78.0–100.0)
Platelets: 159 10*3/uL (ref 150.0–400.0)
RBC: 4.21 Mil/uL — ABNORMAL LOW (ref 4.22–5.81)
RDW: 14 % (ref 11.5–15.5)
WBC: 6.5 10*3/uL (ref 4.0–10.5)

## 2021-03-03 LAB — LIPID PANEL
Cholesterol: 132 mg/dL (ref 0–200)
HDL: 42.5 mg/dL (ref >39.00)
LDL Cholesterol: 65 mg/dL (ref 0–99)
NonHDL: 89.3
Total CHOL/HDL Ratio: 3
Triglycerides: 124 mg/dL (ref 0.0–149.0)
VLDL: 24.8 mg/dL (ref 0.0–40.0)

## 2021-03-03 LAB — HEMOGLOBIN A1C: Hgb A1c MFr Bld: 6.2 % (ref 4.6–6.5)

## 2021-03-03 LAB — TSH: TSH: 0.65 u[IU]/mL (ref 0.35–5.50)

## 2021-03-03 NOTE — Patient Instructions (Addendum)
Call Dr. Durward Fortes for follow up of shoulders and discuss knee symptoms as those may be affecting balance.  Make sure to drink plenty of fluids, get up slowly and use cane or walker at home if feeling unsteady to lessen risk if fall.  I will check updated labs.  Follow up in next month to discuss unsteadiness further.  Return to the clinic or go to the nearest emergency room if any of your symptoms worsen or new symptoms occur.   Fall Prevention in the Home, Adult Falls can cause injuries and affect people of all ages. There are many simple things that you can do to make your home safe and to help prevent falls. Ask for help when making these changes, if needed. What actions can I take to prevent falls? General instructions Use good lighting in all rooms. Replace any light bulbs that burn out, turn on lights if it is dark, and use night-lights. Place frequently used items in easy-to-reach places. Lower the shelves around your home if necessary. Set up furniture so that there are clear paths around it. Avoid moving your furniture around. Remove throw rugs and other tripping hazards from the floor. Avoid walking on wet floors. Fix any uneven floor surfaces. Add color or contrast paint or tape to grab bars and handrails in your home. Place contrasting color strips on the first and last steps of staircases. When you use a stepladder, make sure that it is completely opened and that the sides and supports are firmly locked. Have someone hold the ladder while you are using it. Do not climb a closed stepladder. Know where your pets are when moving through your home. What can I do in the bathroom?   Keep the floor dry. Immediately clean up any water that is on the floor. Remove soap buildup in the tub or shower regularly. Use nonskid mats or decals on the floor of the tub or shower. Attach bath mats securely with double-sided, nonslip rug tape. If you need to sit down while you are in the shower,  use a plastic, nonslip stool. Install grab bars by the toilet and in the tub and shower. Do not use towel bars as grab bars. What can I do in the bedroom? Make sure that a bedside light is easy to reach. Do not use oversized bedding that reaches the floor. Have a firm chair that has side arms to use for getting dressed. What can I do in the kitchen? Clean up any spills right away. If you need to reach for something above you, use a sturdy step stool that has a grab bar. Keep electrical cables out of the way. Do not use floor polish or wax that makes floors slippery. If you must use wax, make sure that it is non-skid floor wax. What can I do with my stairs? Do not leave any items on the stairs. Make sure that you have a light switch at the top and the bottom of the stairs. Have them installed if you do not have them. Make sure that there are handrails on both sides of the stairs. Fix handrails that are broken or loose. Make sure that handrails are as long as the staircases. Install non-slip stair treads on all stairs in your home. Avoid having throw rugs at the top or bottom of stairs, or secure the rugs with carpet tape to prevent them from moving. Choose a carpet design that does not hide the edge of steps on the stairs. Check any  carpeting to make sure that it is firmly attached to the stairs. Fix any carpet that is loose or worn. What can I do on the outside of my home? Use bright outdoor lighting. Regularly repair the edges of walkways and driveways and fix any cracks. Remove high doorway thresholds. Trim any shrubbery on the main path into your home. Regularly check that handrails are securely fastened and in good repair. Both sides of all steps should have handrails. Install guardrails along the edges of any raised decks or porches. Clear walkways of debris and clutter, including tools and rocks. Have leaves, snow, and ice cleared regularly. Use sand or salt on walkways during  winter months. In the garage, clean up any spills right away, including grease or oil spills. What other actions can I take? Wear closed-toe shoes that fit well and support your feet. Wear shoes that have rubber soles or low heels. Use mobility aids as needed, such as canes, walkers, scooters, and crutches. Review your medicines with your health care provider. Some medicines can cause dizziness or changes in blood pressure, which increase your risk of falling. Talk with your health care provider about other ways that you can decrease your risk of falls. This may include working with a physical therapist or trainer to improve your strength, balance, and endurance. Where to find more information Centers for Disease Control and Prevention, [employer]: http://www.wolf.info/ National Institute on Aging: http://kim-miller.com/ Contact a health care provider if: You are afraid of falling at home. You feel weak, drowsy, or dizzy at home. You fall at home. Summary There are many simple things that you can do to make your home safe and to help prevent falls. Ways to make your home safe include removing tripping hazards and installing grab bars in the bathroom. Ask for help when making these changes in your home. This information is not intended to replace advice given to you by your health care provider. Make sure you discuss any questions you have with your health care provider. Document Revised: 10/18/2019 Document Reviewed: 10/18/2019 Elsevier Patient Education  [employer].

## 2021-03-03 NOTE — Progress Notes (Signed)
Subjective:  Patient ID: Bradley Oconnor, male    DOB: 06/18/1940  Age: 80 y.o. MRN: [id number]  CC:  Chief Complaint  Patient presents with   Follow-up    Pt does not receive medication from provider, no concern with previous shoulder pains as pt sees orthopedic, pt last OV 08/14/19    HPI Bradley Oconnor presents for   Follow-up.  Prior shoulder pain discussed last visit has improved and under the care of orthopedics. Dr. Durward Fortes.  Also had thumb surgery. Shoulders still sore at times. No recent ortho visit.  Spouse's health has gradually worsened. Bradley Oconnor has been helping still.   Basal cell carcinoma of face Note d earlier this year, had Mohs procedure.  Followed by dermatology, Dr. Sarajane Jews, appointment in August.  Hypothyroidism: Lab Results  Component Value Date   TSH 0.895 06/18/2017  Synthroid 100 mcg daily.  Prescribed by cardiologist, Dr. Claiborne Billings Taking medication daily.  No new hot or cold intolerance. No new hair or skin changes, heart palpitations or new fatigue. No new unintentional weight changes.    Coronary artery disease, hypertension, hyperlipidemia As above, cardiologist Dr. Claiborne Billings.  Last office visit August 2021.  1 year follow-up planned.  History of MRI in 92, PTCA.  CABG revascularization surgery 1993.  Documented RCA graft occlusion with good left-to-right collaterals.  Treated with Ranexa, resolved anginal symptoms.  Additionally takes metoprolol, isosorbide, amlodipine, ramipril for hypertension with HCTZ as needed for leg edema.  Due for updated labs. Meds working well - no new side effects. No CP/dyspnea.   Hyperlipidemia: Goal LDL less than 70.  On Crestor 20 mg daily. No new myalgias.  Lab Results  Component Value Date   CHOL 141 06/18/2017   HDL 44 06/18/2017   LDLCALC 70 06/18/2017   TRIG 137 06/18/2017   CHOLHDL 3.2 06/18/2017   Lab Results  Component Value Date   ALT 22 06/18/2017   AST 21 06/18/2017   ALKPHOS 54 06/18/2017   BILITOT  0.9 06/18/2017   Balance issues: Feels like equilibrium off since fall last February.  Not getting on ladders.  Tripped on cord with fall last week, no injury. No sensation of room spinning.  Notes balance issue with standing at times - equilibrium, better in few seconds.  May be dragging feet at times. No foot weakness. Knees feel unsteady at times, no giving way, but less stable. Has not discussed with Dr. Durward Fortes. Concerned that may have diabetes - brother had diabetes. No daily headache, no focal weakness.  Hyperglycemia with glucose 103, 114 in 2018-2019.   History Patient Active Problem List   Diagnosis Date Noted   Low back pain 08/07/2020   Trigger finger of right thumb 04/17/2020   Carpal tunnel syndrome, bilateral 09/26/2019   Shoulder pain, bilateral 08/03/2019   Cervicalgia 03/28/2018   GERD (gastroesophageal reflux disease) 04/10/2013   HTN (hypertension) 04/10/2013   CAD (coronary artery disease) of artery bypass graft 10/04/2012   Hypothyroidism 10/04/2012   Hyperlipidemia with target LDL less than 70 10/04/2012   Kidney stones 01/27/2012   Gallstones-symptomatic 01/27/2012   Hx of appendectomy-history of ruptured requiring ileocecectomy (~1994) 01/27/2012   Past Medical History:  Diagnosis Date   Blood transfusion without reported diagnosis    CAD (coronary artery disease)    GERD (gastroesophageal reflux disease)    Heart attack ([place])    Hyperlipidemia    Hypertension 03/07/12   ECHO-WNL     08/12/11 Lexiscan MyoviewNo significant ischemia demonstrated Low risk  scan There is a moderate sized dense scar in the LCX territoy unchanged from the prior study.. Post- stress EF is 40%.   Thyroid disease    Past Surgical History:  Procedure Laterality Date   APPENDECTOMY     CERVICAL SPINE SURGERY     titanium plate in the back of neck   CORONARY ARTERY BYPASS GRAFT     HERNIA REPAIR     SMALL INTESTINE SURGERY     THYROID SURGERY     1/2 thyroid removed on  right side   Allergies  Allergen Reactions   Procardia [Nifedipine] Other (See Comments)    Lowers bp    Phenergan [Promethazine Hcl] Nausea And Vomiting   Prior to Admission medications   Medication Sig Start Date End Date Taking? Authorizing Provider  amLODipine (NORVASC) 5 MG tablet Take 1 tablet (5 mg total) by mouth daily. 02/09/20  Yes Troy Sine, MD  aspirin EC 81 MG tablet Take 1 tablet (81 mg total) by mouth daily. 04/06/16  Yes Troy Sine, MD  fish oil-omega-3 fatty acids 1000 MG capsule Take 1 g by mouth daily.    Yes [provider]  Garlic 3151 MG CAPS Take 1 capsule by mouth daily.    Yes [provider]  hydrochlorothiazide (MICROZIDE) 12.5 MG capsule TAKE 1 CAPSULE BY MOUTH ONCE DAILY AS NEEDED FOR  FLUID  AND  EDEMA 03/07/20  Yes Troy Sine, MD  isosorbide mononitrate (IMDUR) 120 MG 24 hr tablet Take 1 tablet by mouth once daily 01/16/21  Yes Troy Sine, MD  lansoprazole (PREVACID) 15 MG capsule Take 15 mg by mouth daily.   Yes [provider]  levothyroxine (SYNTHROID) 100 MCG tablet Take 1 tablet (100 mcg total) by mouth every morning. NEED OV. 11/29/20  Yes Troy Sine, MD  levothyroxine (SYNTHROID, LEVOTHROID) 100 MCG tablet TAKE 1 TABLET BY MOUTH IN THE MORNING BEFORE BREAKFAST 05/26/17  Yes Troy Sine, MD  metoprolol succinate (TOPROL-XL) 100 MG 24 hr tablet Take 1 tablet (100 mg total) by mouth daily. PATIENT NEEDS TO SCHEDULE AN CARDIOLOGY APPOINTMENT IN ORDER TO RECEIVE FUTURE REFILLS ON MEDICATION. 10/21/20  Yes Troy Sine, MD  nitroGLYCERIN (NITROSTAT) 0.4 MG SL tablet DISSOLVE ONE TABLET UNDER THE TONGUE EVERY 5 MINUTES AS NEEDED FOR CHEST PAIN.  DO NOT EXCEED A TOTAL OF 3 DOSES IN 15 MINUTES 10/07/20  Yes Troy Sine, MD  NITROSTAT 0.4 MG SL tablet DISSOLVE ONE TABLET UNDER THE TONGUE EVERY 5 MINUTES AS NEEDED FOR CHEST PAIN.  DO NOT EXCEED A TOTAL OF 3 DOSES IN 15 MINUTES 11/20/16  Yes Troy Sine, MD   ramipril (ALTACE) 10 MG capsule Take 1 capsule by mouth once daily 02/02/20  Yes Troy Sine, MD  ranolazine (RANEXA) 1000 MG SR tablet TAKE 1  BY MOUTH TWICE DAILY 09/09/20  Yes Troy Sine, MD  rosuvastatin (CRESTOR) 20 MG tablet TAKE 1 TABLET BY MOUTH ONCE DAILY IN THE EVENING 03/07/20  Yes Troy Sine, MD   Social History   Socioeconomic History   Marital status: Married    Spouse name: Not on file   Number of children: Not on file   Years of education: 16   Highest education level: Some college, no degree  Occupational History   Occupation: Retired  Tobacco Use   Smoking status: Never   Smokeless tobacco: Never  Vaping Use   Vaping Use: Never used  Substance and Sexual  Activity   Alcohol use: No    Alcohol/week: 0.0 standard drinks   Drug use: No   Sexual activity: Never  Other Topics Concern   Not on file  Social History Narrative   Married.    Social Determinants of Health   Financial Resource Strain: Not on file  Food Insecurity: Not on file  Transportation Needs: Not on file  Physical Activity: Not on file  Stress: Not on file  Social Connections: Not on file  Intimate Partner Violence: Not on file    Review of Systems  Constitutional:  Negative for fatigue and unexpected weight change.  Eyes:  Negative for visual disturbance.  Respiratory:  Negative for cough, chest tightness and shortness of breath.   Cardiovascular:  Negative for chest pain, palpitations and leg swelling.  Gastrointestinal:  Negative for abdominal pain and blood in stool.  Neurological:  Negative for dizziness, tremors, syncope, speech difficulty, weakness and headaches.    Objective:   Vitals:   03/03/21 1104  BP: 130/76  Pulse: 63  Resp: 16  Temp: 98.1 F (36.7 C)  TempSrc: Temporal  SpO2: 95%  Weight: 185 lb 6.4 oz (84.1 kg)  Height: 5\' 8"  (1.727 m)     Physical Exam Vitals reviewed.  Constitutional:      Appearance: He is well-developed.  HENT:     Head:  Normocephalic and atraumatic.  Eyes:     Extraocular Movements:     Right eye: Normal extraocular motion and no nystagmus.     Left eye: Normal extraocular motion and no nystagmus.  Neck:     Vascular: No carotid bruit or JVD.  Cardiovascular:     Rate and Rhythm: Normal rate and regular rhythm.     Heart sounds: Normal heart sounds. No murmur heard. Pulmonary:     Effort: Pulmonary effort is normal.     Breath sounds: Normal breath sounds. No rales.  Musculoskeletal:     Right lower leg: No edema.     Left lower leg: No edema.  Skin:    General: Skin is warm and dry.  Neurological:     General: No focal deficit present.     Mental Status: He is alert and oriented to person, place, and time.     GCS: GCS eye subscore is 4. GCS verbal subscore is 5. GCS motor subscore is 6.     Cranial Nerves: No cranial nerve deficit, dysarthria or facial asymmetry.     Motor: Motor function is intact. No weakness, tremor or pronator drift.     Coordination: Coordination is intact. Finger-Nose-Finger Test normal.     Comments: Slow to rise but gait intact without use of assistive device  Psychiatric:        Mood and Affect: Mood normal.        Behavior: Behavior normal.     Assessment & Plan:  Bradley Oconnor is a 80 y.o. male . Balance problem - Plan: CBC  -Check CBC, labs, TSH but less likely cause.  Suspect component of relative instability of knees that may be contributing.  Reassuring, nonfocal neurologic exam.  Recommended follow-up with his orthopedist.  RTC precautions, consider neuro eval if persistent.  Primary hypertension - Plan: Comprehensive metabolic panel  -Stable, check labs.  No med changes, follow-up with cardiology as planned.  Coronary artery disease involving coronary bypass graft of native heart with angina pectoris ([place]) - Plan: Lipid panel  -Asymptomatic, continue follow-up with cardiology, check updated lipid panel  Hypothyroidism,  unspecified type - Plan:  TSH  -Denies new symptoms, check TSH is overdue for monitoring labs.  No med changes for now.  Hyperlipidemia with target LDL less than 70 - Plan: Lipid panel Labs as above  Hyperglycemia - Plan: Hemoglobin A1c Screen for diabetes/prediabetes.  No orders of the defined types were placed in this encounter.  Patient Instructions  Call Dr. Durward Fortes for follow up of shoulders and discuss knee symptoms as those may be affecting balance.  Make sure to drink plenty of fluids, get up slowly and use cane or walker at home if feeling unsteady to lessen risk if fall.  I will check updated labs.  Follow up in next month to discuss unsteadiness further.  Return to the clinic or go to the nearest emergency room if any of your symptoms worsen or new symptoms occur.   Fall Prevention in the Home, Adult Falls can cause injuries and affect people of all ages. There are many simple things that you can do to make your home safe and to help prevent falls. Ask for help when making these changes, if needed. What actions can I take to prevent falls? General instructions Use good lighting in all rooms. Replace any light bulbs that burn out, turn on lights if it is dark, and use night-lights. Place frequently used items in easy-to-reach places. Lower the shelves around your home if necessary. Set up furniture so that there are clear paths around it. Avoid moving your furniture around. Remove throw rugs and other tripping hazards from the floor. Avoid walking on wet floors. Fix any uneven floor surfaces. Add color or contrast paint or tape to grab bars and handrails in your home. Place contrasting color strips on the first and last steps of staircases. When you use a stepladder, make sure that it is completely opened and that the sides and supports are firmly locked. Have someone hold the ladder while you are using it. Do not climb a closed stepladder. Know where your pets are when moving through your  home. What can I do in the bathroom?   Keep the floor dry. Immediately clean up any water that is on the floor. Remove soap buildup in the tub or shower regularly. Use nonskid mats or decals on the floor of the tub or shower. Attach bath mats securely with double-sided, nonslip rug tape. If you need to sit down while you are in the shower, use a plastic, nonslip stool. Install grab bars by the toilet and in the tub and shower. Do not use towel bars as grab bars. What can I do in the bedroom? Make sure that a bedside light is easy to reach. Do not use oversized bedding that reaches the floor. Have a firm chair that has side arms to use for getting dressed. What can I do in the kitchen? Clean up any spills right away. If you need to reach for something above you, use a sturdy step stool that has a grab bar. Keep electrical cables out of the way. Do not use floor polish or wax that makes floors slippery. If you must use wax, make sure that it is non-skid floor wax. What can I do with my stairs? Do not leave any items on the stairs. Make sure that you have a light switch at the top and the bottom of the stairs. Have them installed if you do not have them. Make sure that there are handrails on both sides of the stairs. Fix handrails that are  broken or loose. Make sure that handrails are as long as the staircases. Install non-slip stair treads on all stairs in your home. Avoid having throw rugs at the top or bottom of stairs, or secure the rugs with carpet tape to prevent them from moving. Choose a carpet design that does not hide the edge of steps on the stairs. Check any carpeting to make sure that it is firmly attached to the stairs. Fix any carpet that is loose or worn. What can I do on the outside of my home? Use bright outdoor lighting. Regularly repair the edges of walkways and driveways and fix any cracks. Remove high doorway thresholds. Trim any shrubbery on the main path into your  home. Regularly check that handrails are securely fastened and in good repair. Both sides of all steps should have handrails. Install guardrails along the edges of any raised decks or porches. Clear walkways of debris and clutter, including tools and rocks. Have leaves, Oconnor, and ice cleared regularly. Use sand or salt on walkways during winter months. In the garage, clean up any spills right away, including grease or oil spills. What other actions can I take? Wear closed-toe shoes that fit well and support your feet. Wear shoes that have rubber soles or low heels. Use mobility aids as needed, such as canes, walkers, scooters, and crutches. Review your medicines with your health care provider. Some medicines can cause dizziness or changes in blood pressure, which increase your risk of falling. Talk with your health care provider about other ways that you can decrease your risk of falls. This may include working with a physical therapist or trainer to improve your strength, balance, and endurance. Where to find more information Centers for Disease Control and Prevention, [employer]: http://www.wolf.info/ National Institute on Aging: http://kim-miller.com/ Contact a health care provider if: You are afraid of falling at home. You feel weak, drowsy, or dizzy at home. You fall at home. Summary There are many simple things that you can do to make your home safe and to help prevent falls. Ways to make your home safe include removing tripping hazards and installing grab bars in the bathroom. Ask for help when making these changes in your home. This information is not intended to replace advice given to you by your health care provider. Make sure you discuss any questions you have with your health care provider. Document Revised: 10/18/2019 Document Reviewed: 10/18/2019 Elsevier Patient Education  2022 [place],   Merri Ray, MD [place] 03/03/21 12:11 PM

## 2021-03-04 ENCOUNTER — Encounter: Payer: Self-pay | Admitting: Family Medicine

## 2021-03-05 ENCOUNTER — Other Ambulatory Visit: Payer: Self-pay | Admitting: Cardiovascular Disease

## 2021-03-10 ENCOUNTER — Telehealth: Payer: Self-pay | Admitting: Family Medicine

## 2021-03-10 NOTE — Chronic Care Management (AMB) (Signed)
  Chronic Care Management   Outreach Note  03/10/2021 Name: Bradley Oconnor MRN: [id number] DOB: 05-Mar-1941  Referred by: Wendie Agreste, MD Reason for referral : No chief complaint on file.   An unsuccessful telephone outreach was attempted today. The patient was referred to the pharmacist for assistance with care management and care coordination.   Follow Up Plan:   Tatjana Dellinger Upstream Scheduler

## 2021-03-16 ENCOUNTER — Other Ambulatory Visit: Payer: Self-pay | Admitting: Cardiovascular Disease

## 2021-03-17 ENCOUNTER — Other Ambulatory Visit: Payer: Self-pay

## 2021-03-17 MED ORDER — HYDROCHLOROTHIAZIDE 12.5 MG PO CAPS: 12.5000 mg | ORAL_CAPSULE | Freq: Every day | ORAL | 1 refills | Status: DC

## 2021-03-18 ENCOUNTER — Other Ambulatory Visit: Payer: Self-pay | Admitting: Family Medicine

## 2021-03-18 ENCOUNTER — Telehealth: Payer: Self-pay | Admitting: Family Medicine

## 2021-03-18 DIAGNOSIS — E876 Hypokalemia: Secondary | ICD-10-CM

## 2021-03-18 NOTE — Chronic Care Management (AMB) (Signed)
°  Chronic Care Management   Note  03/18/2021 Name: Bradley Oconnor MRN: [id number] DOB: 09/15/1940  Bradley Oconnor is a 80 y.o. year old male who is a primary care patient of Carlota Raspberry Ranell Patrick, MD. I reached out to Dala Dock by phone today in response to a referral sent by Bradley Oconnor's PCP, Wendie Agreste, MD.   Bradley Oconnor was given information about Chronic Care Management services today including:  CCM service includes personalized support from designated clinical staff supervised by his physician, including individualized plan of care and coordination with other care providers 24/7 contact phone numbers for assistance for urgent and routine care needs. Service will only be billed when office clinical staff spend 20 minutes or more in a month to coordinate care. Only one practitioner may furnish and bill the service in a calendar month. The patient may stop CCM services at any time (effective at the end of the month) by phone call to the office staff.   Patient agreed to services and verbal consent obtained.   Follow up plan:   Tatjana Secretary/administrator

## 2021-03-22 ENCOUNTER — Other Ambulatory Visit: Payer: Self-pay | Admitting: Cardiovascular Disease

## 2021-03-26 ENCOUNTER — Telehealth: Payer: Self-pay

## 2021-03-26 NOTE — Telephone Encounter (Signed)
Patient aware of labs, he stated he would call to set up lab visit when he returned from out of town

## 2021-03-26 NOTE — Telephone Encounter (Signed)
-----   Message from Wendie Agreste, MD sent at 03/18/2021 10:23 AM EST ----- Call patient  Potassium was low at last visit.  4-month blood sugar was at prediabetes range but stable.  Thyroid test, other labs overall looked okay including cholesterol levels.  Please schedule lab visit in the next few days if possible for repeat potassium level and if still low will need supplementation.  If any new symptoms be seen right away.

## 2021-04-01 ENCOUNTER — Other Ambulatory Visit (INDEPENDENT_AMBULATORY_CARE_PROVIDER_SITE_OTHER): Payer: PPO

## 2021-04-01 DIAGNOSIS — E876 Hypokalemia: Secondary | ICD-10-CM | POA: Diagnosis not present

## 2021-04-01 LAB — BASIC METABOLIC PANEL
BUN: 18 mg/dL (ref 6–23)
CO2: 32 mEq/L (ref 19–32)
Calcium: 8.6 mg/dL (ref 8.4–10.5)
Chloride: 100 mEq/L (ref 96–112)
Creatinine, Ser: 1.31 mg/dL (ref 0.40–1.50)
GFR: 51 mL/min — ABNORMAL LOW (ref >60.00)
Glucose, Bld: 101 mg/dL — ABNORMAL HIGH (ref 70–99)
Potassium: 3.4 mEq/L — ABNORMAL LOW (ref 3.5–5.1)
Sodium: 141 mEq/L (ref 135–145)

## 2021-04-02 ENCOUNTER — Other Ambulatory Visit: Payer: Self-pay | Admitting: Cardiovascular Disease

## 2021-04-03 ENCOUNTER — Other Ambulatory Visit: Payer: Self-pay

## 2021-04-03 ENCOUNTER — Telehealth: Payer: Self-pay | Admitting: Cardiovascular Disease

## 2021-04-03 NOTE — Telephone Encounter (Signed)
*  STAT* If patient is at the pharmacy, call can be transferred to refill team.   1. Which medications need to be refilled? (please list name of each medication and dose if known)  rosuvastatin (CRESTOR) 20 MG tablet amLODipine (NORVASC) 5 MG tablet isosorbide mononitrate (IMDUR) 120 MG 24 hr tablet   2. Which pharmacy/location (including street and city if local pharmacy) is medication to be sent to? [place], [place]  3. Do they need a 30 day or 90 day supply?   90 day supply  Patient states he will be completely out of medication in 1 day.

## 2021-04-05 ENCOUNTER — Other Ambulatory Visit: Payer: Self-pay | Admitting: Cardiovascular Disease

## 2021-04-11 ENCOUNTER — Ambulatory Visit (INDEPENDENT_AMBULATORY_CARE_PROVIDER_SITE_OTHER): Payer: PPO | Admitting: Registered Nurse

## 2021-04-11 ENCOUNTER — Encounter: Payer: Self-pay | Admitting: Registered Nurse

## 2021-04-11 VITALS — BP 128/76 | HR 75 | Temp 99.8°F | Resp 15 | Ht 68.0 in

## 2021-04-11 DIAGNOSIS — R051 Acute cough: Secondary | ICD-10-CM

## 2021-04-11 DIAGNOSIS — U071 COVID-19: Secondary | ICD-10-CM | POA: Diagnosis not present

## 2021-04-11 LAB — POCT INFLUENZA A/B
Influenza A, POC: NEGATIVE
Influenza B, POC: NEGATIVE

## 2021-04-11 LAB — POC COVID19 BINAXNOW: SARS Coronavirus 2 Ag: POSITIVE — AB

## 2021-04-11 MED ORDER — NIRMATRELVIR/RITONAVIR (PAXLOVID) TABLET (RENAL DOSING): 2.0000 | ORAL_TABLET | Freq: Two times a day (BID) | ORAL | 0 refills | Status: AC

## 2021-04-11 MED ORDER — FLUTICASONE PROPIONATE 50 MCG/ACT NA SUSP: 2.0000 | Freq: Every day | NASAL | 6 refills | Status: DC

## 2021-04-11 MED ORDER — DM-GUAIFENESIN ER 30-600 MG PO TB12: 1.0000 | ORAL_TABLET | Freq: Two times a day (BID) | ORAL | 0 refills | Status: DC

## 2021-04-11 NOTE — Patient Instructions (Signed)
Mr. Intriago -   Doristine Devoid to meet you  Take paxlovid as instructed. Do not take Rosuvastatin when you are taking this.  Ok to continue imodium  I have sent mucinex and flonase to use as instructed as needed for symptom relief.  Get plenty of rest and hydrate aggressively.   Call if worsening or failing to improve.  Thank you,  Rich

## 2021-04-11 NOTE — Progress Notes (Signed)
Established Patient Office Visit  Subjective:  Patient ID: Bradley Oconnor, male    DOB: June 07, 1940  Age: 81 y.o. MRN: [id number]  CC:  Chief Complaint  Patient presents with   Diarrhea    Pt reports weakness, diarrhea, denies SOB and chest pain, fever 101.8 2 hours ago currently 99.8,  cough congestion, started 4 days ago    HPI Bradley Oconnor presents for flu like symptoms  Weakness, diarrhea, fever, aches Onset 4 days ago.  Tmax 101.8, taking tylenol to keep it down.  Diarrhea a few times a day but not unmanageable. Continues to hydrate to replace fluids.   No shob, doe, chest pain, palpitations, LOC  Past Medical History:  Diagnosis Date   Blood transfusion without reported diagnosis    CAD (coronary artery disease)    GERD (gastroesophageal reflux disease)    Heart attack ([facility])    Hyperlipidemia    Hypertension 03/07/12   ECHO-WNL     08/12/11 Lexiscan MyoviewNo significant ischemia demonstrated Low risk scan There is a moderate sized dense scar in the LCX territoy unchanged from the prior study.. Post- stress EF is 40%.   Thyroid disease     Past Surgical History:  Procedure Laterality Date   APPENDECTOMY     CERVICAL SPINE SURGERY     titanium plate in the back of neck   CORONARY ARTERY BYPASS GRAFT     HERNIA REPAIR     SMALL INTESTINE SURGERY     THYROID SURGERY     1/2 thyroid removed on right side    Family History  Problem Relation Age of Onset   Heart disease Mother    Cancer Mother        breast, stomach   Heart disease Father    Hyperlipidemia Father    Heart disease Brother    Diabetes Brother    Hyperlipidemia Brother    Heart disease Paternal Grandfather    Healthy Brother     Social History   Socioeconomic History   Marital status: Married    Spouse name: Not on file   Number of children: Not on file   Years of education: 16   Highest education level: Some college, no degree  Occupational History   Occupation: Retired   Tobacco Use   Smoking status: Never   Smokeless tobacco: Never  Scientific laboratory technician Use: Never used  Substance and Sexual Activity   Alcohol use: No    Alcohol/week: 0.0 standard drinks   Drug use: No   Sexual activity: Never  Other Topics Concern   Not on file  Social History Narrative   Married.    Social Determinants of Health   Financial Resource Strain: Not on file  Food Insecurity: Not on file  Transportation Needs: Not on file  Physical Activity: Not on file  Stress: Not on file  Social Connections: Not on file  Intimate Partner Violence: Not on file    Outpatient Medications Prior to Visit  Medication Sig Dispense Refill   amLODipine (NORVASC) 5 MG tablet Take 1 tablet by mouth once daily 30 tablet 0   aspirin EC 81 MG tablet Take 1 tablet (81 mg total) by mouth daily. 90 tablet 3   fish oil-omega-3 fatty acids 1000 MG capsule Take 1 g by mouth daily.      Garlic 2703 MG CAPS Take 1 capsule by mouth daily.      hydrochlorothiazide (MICROZIDE) 12.5 MG capsule Take 1 capsule (12.5  mg total) by mouth daily. 90 capsule 1   isosorbide mononitrate (IMDUR) 120 MG 24 hr tablet Take 1 tablet by mouth once daily 30 tablet 0   lansoprazole (PREVACID) 15 MG capsule Take 15 mg by mouth daily.     levothyroxine (SYNTHROID) 100 MCG tablet TAKE 1 TABLET BY MOUTH IN THE MORNING **NEED  OFFICE  VISIT** 90 tablet 0   levothyroxine (SYNTHROID, LEVOTHROID) 100 MCG tablet TAKE 1 TABLET BY MOUTH IN THE MORNING BEFORE BREAKFAST 90 tablet 1   metoprolol succinate (TOPROL-XL) 100 MG 24 hr tablet Take 1 tablet (100 mg total) by mouth daily. PATIENT NEEDS TO SCHEDULE AN CARDIOLOGY APPOINTMENT IN ORDER TO RECEIVE FUTURE REFILLS ON MEDICATION. 180 tablet 0   nitroGLYCERIN (NITROSTAT) 0.4 MG SL tablet DISSOLVE ONE TABLET UNDER THE TONGUE EVERY 5 MINUTES AS NEEDED FOR CHEST PAIN.  DO NOT EXCEED A TOTAL OF 3 DOSES IN 15 MINUTES 25 tablet 0   NITROSTAT 0.4 MG SL tablet DISSOLVE ONE TABLET UNDER THE  TONGUE EVERY 5 MINUTES AS NEEDED FOR CHEST PAIN.  DO NOT EXCEED A TOTAL OF 3 DOSES IN 15 MINUTES 25 tablet 3   ramipril (ALTACE) 10 MG capsule Take 1 capsule by mouth once daily 90 capsule 1   ranolazine (RANEXA) 1000 MG SR tablet TAKE 1  BY MOUTH TWICE DAILY 180 tablet 1   rosuvastatin (CRESTOR) 20 MG tablet TAKE 1 TABLET BY MOUTH ONCE DAILY IN THE EVENING 90 tablet 1   No facility-administered medications prior to visit.    Allergies  Allergen Reactions   Procardia [Nifedipine] Other (See Comments)    Lowers bp    Phenergan [Promethazine Hcl] Nausea And Vomiting    ROS Review of Systems Per hpi   Objective:    Physical Exam Constitutional:      General: He is not in acute distress.    Appearance: Normal appearance. He is normal weight. He is not ill-appearing, toxic-appearing or diaphoretic.  Cardiovascular:     Rate and Rhythm: Normal rate and regular rhythm.     Heart sounds: Normal heart sounds. No murmur heard.   No friction rub. No gallop.  Pulmonary:     Effort: Pulmonary effort is normal. No respiratory distress.     Breath sounds: Normal breath sounds. No stridor. No wheezing, rhonchi or rales.  Chest:     Chest wall: No tenderness.  Neurological:     General: No focal deficit present.     Mental Status: He is alert and oriented to person, place, and time. Mental status is at baseline.  Psychiatric:        Mood and Affect: Mood normal.        Behavior: Behavior normal.        Thought Content: Thought content normal.        Judgment: Judgment normal.    BP 128/76    Pulse 75    Temp 99.8 F (37.7 C) (Oral)    Resp 15    Ht 5\' 8"  (1.727 m)    SpO2 93%    BMI 28.19 kg/m  Wt Readings from Last 3 Encounters:  03/03/21 185 lb 6.4 oz (84.1 kg)  10/10/20 179 lb (81.2 kg)  08/07/20 179 lb (81.2 kg)     Health Maintenance Due  Topic Date Due   Zoster Vaccines- Shingrix (1 of 2) Never done   Pneumonia Vaccine 14+ Years old (2 - PCV) 04/18/2015   COVID-19  Vaccine (3 - Booster for [employer] series)  08/29/2019    There are no preventive care reminders to display for this patient.  Lab Results  Component Value Date   TSH 0.65 03/03/2021   Lab Results  Component Value Date   WBC 6.5 03/03/2021   HGB 13.0 03/03/2021   HCT 38.9 (L) 03/03/2021   MCV 92.5 03/03/2021   PLT 159.0 03/03/2021   Lab Results  Component Value Date   NA 141 04/01/2021   K 3.4 (L) 04/01/2021   CO2 32 04/01/2021   GLUCOSE 101 (H) 04/01/2021   BUN 18 04/01/2021   CREATININE 1.31 04/01/2021   BILITOT 1.2 03/03/2021   ALKPHOS 46 03/03/2021   AST 14 03/03/2021   ALT 12 03/03/2021   PROT 6.9 03/03/2021   ALBUMIN 4.0 03/03/2021   CALCIUM 8.6 04/01/2021   GFR 51.00 (L) 04/01/2021   Lab Results  Component Value Date   CHOL 132 03/03/2021   Lab Results  Component Value Date   HDL 42.50 03/03/2021   Lab Results  Component Value Date   LDLCALC 65 03/03/2021   Lab Results  Component Value Date   TRIG 124.0 03/03/2021   Lab Results  Component Value Date   CHOLHDL 3 03/03/2021   Lab Results  Component Value Date   HGBA1C 6.2 03/03/2021      Assessment & Plan:   Problem List Items Addressed This Visit   None Visit Diagnoses     Acute cough    -  Primary   Relevant Orders   POCT Influenza A/B (Completed)   POC COVID-19 (Completed)   COVID-19       Relevant Medications   nirmatrelvir/ritonavir EUA, renal dosing, (PAXLOVID) 10 x 150 MG & 10 x 100MG  TABS   dextromethorphan-guaiFENesin (MUCINEX DM) 30-600 MG 12hr tablet   fluticasone (FLONASE) 50 MCG/ACT nasal spray       Meds ordered this encounter  Medications   nirmatrelvir/ritonavir EUA, renal dosing, (PAXLOVID) 10 x 150 MG & 10 x 100MG  TABS    Sig: Take 2 tablets by mouth 2 (two) times daily for 5 days. (Take nirmatrelvir 150 mg one tablet twice daily for 5 days and ritonavir 100 mg one tablet twice daily for 5 days) Patient GFR is 51    Dispense:  20 tablet    Refill:  0    Order  Specific Question:   Supervising Provider    Answer:   Carlota Raspberry, JEFFREY R [[id number]]   dextromethorphan-guaiFENesin (MUCINEX DM) 30-600 MG 12hr tablet    Sig: Take 1 tablet by mouth 2 (two) times daily.    Dispense:  20 tablet    Refill:  0    Order Specific Question:   Supervising Provider    Answer:   Carlota Raspberry, JEFFREY R [[id number]]   fluticasone (FLONASE) 50 MCG/ACT nasal spray    Sig: Place 2 sprays into both nostrils daily.    Dispense:  16 g    Refill:  6    Order Specific Question:   Supervising Provider    Answer:   Carlota Raspberry, JEFFREY R [[id number]]    Follow-up: Return if symptoms worsen or fail to improve.   PLAN Poct covid test +. Discussed risks, benefits, aE, and alternatives to antivirals. Will proceed as above with renal dosing paxlovid.  Hold statin while taking paxlovid. Supportive care with mucinex and flonase. Encourage hydration and rest. Reviewed ER precautions and reasons to return Patient encouraged to call clinic with any questions, comments, or concerns.  Maximiano Coss, NP

## 2021-04-16 ENCOUNTER — Ambulatory Visit: Payer: PPO | Admitting: Family Medicine

## 2021-04-21 ENCOUNTER — Ambulatory Visit (INDEPENDENT_AMBULATORY_CARE_PROVIDER_SITE_OTHER): Payer: PPO | Admitting: Family Medicine

## 2021-04-21 ENCOUNTER — Encounter: Payer: Self-pay | Admitting: Family Medicine

## 2021-04-21 VITALS — BP 110/62 | HR 64 | Temp 98.0°F | Ht 68.0 in | Wt 174.6 lb

## 2021-04-21 DIAGNOSIS — Z9181 History of falling: Secondary | ICD-10-CM

## 2021-04-21 DIAGNOSIS — U071 COVID-19: Secondary | ICD-10-CM

## 2021-04-21 DIAGNOSIS — R2689 Other abnormalities of gait and mobility: Secondary | ICD-10-CM | POA: Diagnosis not present

## 2021-04-21 MED ORDER — BENZONATATE 100 MG PO CAPS
100.0000 mg | ORAL_CAPSULE | Freq: Three times a day (TID) | ORAL | 0 refills | Status: DC | PRN
Start: 2021-04-21 — End: 2021-05-14

## 2021-04-21 NOTE — Patient Instructions (Addendum)
I do recommend evaluation with neurology - let me know when we can place that referral. Return to the clinic or go to the nearest emergency room if any of your symptoms worsen or new symptoms occur.  Based on recent symptoms you may have the condition rebound COVID.  This should be milder than the first episode.  No new medication/antivirals at this time but if any worsening of symptoms including shortness of breath, chest pain, or continued worsening symptoms be seen through urgent care or emergency room as we discussed.  Continue Tylenol as needed for body aches, make sure to drink plenty of fluids.  Mucinex if needed for cough or Tessalon Perles if needed at bedtime to help suppress cough.   Follow-up for video visit in 2 days and we can check and see how you are doing at that time.  Depending on your visit in a couple of days we can discuss follow-up for the unsteadiness/balance issues as well as repeat blood work if needed.    Fall Prevention in the Home, Adult Falls can cause injuries and affect people of all ages. There are many simple things that you can do to make your home safe and to help prevent falls. Ask for help when making these changes, if needed. What actions can I take to prevent falls? General instructions Use good lighting in all rooms. Replace any light bulbs that burn out, turn on lights if it is dark, and use night-lights. Place frequently used items in easy-to-reach places. Lower the shelves around your home if necessary. Set up furniture so that there are clear paths around it. Avoid moving your furniture around. Remove throw rugs and other tripping hazards from the floor. Avoid walking on wet floors. Fix any uneven floor surfaces. Add color or contrast paint or tape to grab bars and handrails in your home. Place contrasting color strips on the first and last steps of staircases. When you use a stepladder, make sure that it is completely opened and that the sides and  supports are firmly locked. Have someone hold the ladder while you are using it. Do not climb a closed stepladder. Know where your pets are when moving through your home. What can I do in the bathroom?   Keep the floor dry. Immediately clean up any water that is on the floor. Remove soap buildup in the tub or shower regularly. Use nonskid mats or decals on the floor of the tub or shower. Attach bath mats securely with double-sided, nonslip rug tape. If you need to sit down while you are in the shower, use a plastic, nonslip stool. Install grab bars by the toilet and in the tub and shower. Do not use towel bars as grab bars. What can I do in the bedroom? Make sure that a bedside light is easy to reach. Do not use oversized bedding that reaches the floor. Have a firm chair that has side arms to use for getting dressed. What can I do in the kitchen? Clean up any spills right away. If you need to reach for something above you, use a sturdy step stool that has a grab bar. Keep electrical cables out of the way. Do not use floor polish or wax that makes floors slippery. If you must use wax, make sure that it is non-skid floor wax. What can I do with my stairs? Do not leave any items on the stairs. Make sure that you have a light switch at the top and the bottom  of the stairs. Have them installed if you do not have them. Make sure that there are handrails on both sides of the stairs. Fix handrails that are broken or loose. Make sure that handrails are as long as the staircases. Install non-slip stair treads on all stairs in your home. Avoid having throw rugs at the top or bottom of stairs, or secure the rugs with carpet tape to prevent them from moving. Choose a carpet design that does not hide the edge of steps on the stairs. Check any carpeting to make sure that it is firmly attached to the stairs. Fix any carpet that is loose or worn. What can I do on the outside of my home? Use bright outdoor  lighting. Regularly repair the edges of walkways and driveways and fix any cracks. Remove high doorway thresholds. Trim any shrubbery on the main path into your home. Regularly check that handrails are securely fastened and in good repair. Both sides of all steps should have handrails. Install guardrails along the edges of any raised decks or porches. Clear walkways of debris and clutter, including tools and rocks. Have leaves, snow, and ice cleared regularly. Use sand or salt on walkways during winter months. In the garage, clean up any spills right away, including grease or oil spills. What other actions can I take? Wear closed-toe shoes that fit well and support your feet. Wear shoes that have rubber soles or low heels. Use mobility aids as needed, such as canes, walkers, scooters, and crutches. Review your medicines with your health care provider. Some medicines can cause dizziness or changes in blood pressure, which increase your risk of falling. Talk with your health care provider about other ways that you can decrease your risk of falls. This may include working with a physical therapist or trainer to improve your strength, balance, and endurance. Where to find more information Centers for Disease Control and Prevention, [employer]: http://www.wolf.info/ National Institute on Aging: http://kim-miller.com/ Contact a health care provider if: You are afraid of falling at home. You feel weak, drowsy, or dizzy at home. You fall at home. Summary There are many simple things that you can do to make your home safe and to help prevent falls. Ways to make your home safe include removing tripping hazards and installing grab bars in the bathroom. Ask for help when making these changes in your home. This information is not intended to replace advice given to you by your health care provider. Make sure you discuss any questions you have with your health care provider. Document Revised: 10/18/2019 Document Reviewed:  10/18/2019 Elsevier Patient Education  [employer].  Dizziness Dizziness is a common problem. It is a feeling of unsteadiness or light-headedness. You may feel like you are about to faint. Dizziness can lead to injury if you stumble or fall. Anyone can become dizzy, but dizziness is more common in older adults. This condition can be caused by a number of things, including medicines, dehydration, or illness. Follow these instructions at home: Eating and drinking  Drink enough fluid to keep your urine pale yellow. This helps to keep you from becoming dehydrated. Try to drink more clear fluids, such as water. Do not drink alcohol. Limit your caffeine intake if told to do so by your health care provider. Check ingredients and nutrition facts to see if a food or beverage contains caffeine. Limit your salt (sodium) intake if told to do so by your health care provider. Check ingredients and nutrition facts to see  if a food or beverage contains sodium. Activity  Avoid making quick movements. Rise slowly from chairs and steady yourself until you feel okay. In the morning, first sit up on the side of the bed. When you feel okay, stand slowly while you hold onto something until you know that your balance is good. If you need to stand in one place for a long time, move your legs often. Tighten and relax the muscles in your legs while you are standing. Do not drive or use machinery if you feel dizzy. Avoid bending down if you feel dizzy. Place items in your home so that they are easy for you to reach without leaning over. Lifestyle Do not use any products that contain nicotine or tobacco. These products include cigarettes, chewing tobacco, and vaping devices, such as e-cigarettes. If you need help quitting, ask your health care provider. Try to reduce your stress level by using methods such as yoga or meditation. Talk with your health care provider if you need help to manage your stress. General  instructions Watch your dizziness for any changes. Take over-the-counter and prescription medicines only as told by your health care provider. Talk with your health care provider if you think that your dizziness is caused by a medicine that you are taking. Tell a friend or a family member that you are feeling dizzy. If he or she notices any changes in your behavior, have this person call your health care provider. Keep all follow-up visits. This is important. Contact a health care provider if: Your dizziness does not go away or you have new symptoms. Your dizziness or light-headedness gets worse. You feel nauseous. You have reduced hearing. You have a fever. You have neck pain or a stiff neck. Your dizziness leads to an injury or a fall. Get help right away if: You vomit or have diarrhea and are unable to eat or drink anything. You have problems talking, walking, swallowing, or using your arms, hands, or legs. You feel generally weak. You have any bleeding. You are not thinking clearly or you have trouble forming sentences. It may take a friend or family member to notice this. You have chest pain, abdominal pain, shortness of breath, or sweating. Your vision changes or you develop a severe headache. These symptoms may represent a serious problem that is an emergency. Do not wait to see if the symptoms will go away. Get medical help right away. Call your local emergency services (911 in the [place] Do not drive yourself to the hospital. Summary Dizziness is a feeling of unsteadiness or light-headedness. This condition can be caused by a number of things, including medicines, dehydration, or illness. Anyone can become dizzy, but dizziness is more common in older adults. Drink enough fluid to keep your urine pale yellow. Do not drink alcohol. Avoid making quick movements if you feel dizzy. Monitor your dizziness for any changes. This information is not intended to replace advice given to you  by your health care provider. Make sure you discuss any questions you have with your health care provider. Document Revised: 02/19/2020 Document Reviewed: 02/19/2020 Elsevier Patient Education  2022 [employer].

## 2021-04-21 NOTE — Progress Notes (Signed)
Subjective:  Patient ID: Bradley Oconnor, male    DOB: Jan 21, 1941  Age: 81 y.o. MRN: [id number]  CC:  Chief Complaint  Patient presents with   Follow-up    Unsteadiness & recovering      HPI Bradley Oconnor presents for   Balance difficulty/unsteadiness Discussed last visit, CBC, TSH reassuring.  Suspected a component of relative instability of knees that may have been contributing.  Recommended follow-up with Ortho.  Since last visit symptoms have improved.   Still some difficulty, off balance feeling - feels like equilibrium. No new HA, or focal weakness. Not lightheaded/syncope/near-syncope/CP/palpitations. Minimal improvement.  Fell once 1 week ago - trying to sit on commode - felt off balance - during covid infection. Diarrhea at that time. No falls past few days.  Does not want to see neurology at this time due to his wife's issues.  Hypokalemia - 3.1 on 12/5, 3.4 on 1/3. No current supplements eating sweet potato, banana daily - about daily.  Drinking fluids.   Lab Results  Component Value Date   WBC 6.5 03/03/2021   HGB 13.0 03/03/2021   HCT 38.9 (L) 03/03/2021   MCV 92.5 03/03/2021   PLT 159.0 03/03/2021   Covid 19 infection: Unfortunately did have a COVID-19 infection diagnosed 10 days ago, but symptoms starting 14 days ago.  Had upset stomach, diarrhea after last visit, no vomiting.  Took all 5 days of paxlovid. Minimal change.  Felt better until 2days ago - felt worse- chills, temp 102 day or two ago. No chest pain, dyspnea., or confusion.  Cough has improved.  Eating and drinking ok. Min sleep interruption with cough, mucinex and tylenol used.  Diarrhea improved with immodium.    History Patient Active Problem List   Diagnosis Date Noted   Low back pain 08/07/2020   Trigger finger of right thumb 04/17/2020   Carpal tunnel syndrome, bilateral 09/26/2019   Shoulder pain, bilateral 08/03/2019   Cervicalgia 03/28/2018   GERD (gastroesophageal reflux  disease) 04/10/2013   HTN (hypertension) 04/10/2013   CAD (coronary artery disease) of artery bypass graft 10/04/2012   Hypothyroidism 10/04/2012   Hyperlipidemia with target LDL less than 70 10/04/2012   Kidney stones 01/27/2012   Gallstones-symptomatic 01/27/2012   Hx of appendectomy-history of ruptured requiring ileocecectomy (~1994) 01/27/2012   Past Medical History:  Diagnosis Date   Blood transfusion without reported diagnosis    CAD (coronary artery disease)    GERD (gastroesophageal reflux disease)    Heart attack ([place])    Hyperlipidemia    Hypertension 03/07/12   ECHO-WNL     08/12/11 Lexiscan MyoviewNo significant ischemia demonstrated Low risk scan There is a moderate sized dense scar in the LCX territoy unchanged from the prior study.. Post- stress EF is 40%.   Thyroid disease    Past Surgical History:  Procedure Laterality Date   APPENDECTOMY     CERVICAL SPINE SURGERY     titanium plate in the back of neck   CORONARY ARTERY BYPASS GRAFT     HERNIA REPAIR     SMALL INTESTINE SURGERY     THYROID SURGERY     1/2 thyroid removed on right side   Allergies  Allergen Reactions   Procardia [Nifedipine] Other (See Comments)    Lowers bp    Phenergan [Promethazine Hcl] Nausea And Vomiting   Prior to Admission medications   Medication Sig Start Date End Date Taking? Authorizing Provider  amLODipine (NORVASC) 5 MG tablet Take 1 tablet by  mouth once daily 04/02/21  Yes Troy Sine, MD  aspirin EC 81 MG tablet Take 1 tablet (81 mg total) by mouth daily. 04/06/16  Yes Troy Sine, MD  dextromethorphan-guaiFENesin [facility] DM) 30-600 MG 12hr tablet Take 1 tablet by mouth 2 (two) times daily. 04/11/21  Yes Maximiano Coss, NP  fish oil-omega-3 fatty acids 1000 MG capsule Take 1 g by mouth daily.    Yes [provider]  fluticasone (FLONASE) 50 MCG/ACT nasal spray Place 2 sprays into both nostrils daily. 04/11/21  Yes Maximiano Coss, NP  Garlic 0086 MG CAPS Take 1  capsule by mouth daily.    Yes [provider]  hydrochlorothiazide (MICROZIDE) 12.5 MG capsule Take 1 capsule (12.5 mg total) by mouth daily. 03/17/21  Yes Troy Sine, MD  isosorbide mononitrate (IMDUR) 120 MG 24 hr tablet Take 1 tablet by mouth once daily 04/02/21  Yes Troy Sine, MD  lansoprazole (PREVACID) 15 MG capsule Take 15 mg by mouth daily.   Yes [provider]  levothyroxine (SYNTHROID) 100 MCG tablet TAKE 1 TABLET BY MOUTH IN THE MORNING **NEED  OFFICE  VISIT** 03/05/21  Yes Troy Sine, MD  levothyroxine (SYNTHROID, LEVOTHROID) 100 MCG tablet TAKE 1 TABLET BY MOUTH IN THE MORNING BEFORE BREAKFAST 05/26/17  Yes Troy Sine, MD  metoprolol succinate (TOPROL-XL) 100 MG 24 hr tablet Take 1 tablet (100 mg total) by mouth daily. PATIENT NEEDS TO SCHEDULE AN CARDIOLOGY APPOINTMENT IN ORDER TO RECEIVE FUTURE REFILLS ON MEDICATION. 10/21/20  Yes Troy Sine, MD  nitroGLYCERIN (NITROSTAT) 0.4 MG SL tablet DISSOLVE ONE TABLET UNDER THE TONGUE EVERY 5 MINUTES AS NEEDED FOR CHEST PAIN.  DO NOT EXCEED A TOTAL OF 3 DOSES IN 15 MINUTES 10/07/20  Yes Troy Sine, MD  NITROSTAT 0.4 MG SL tablet DISSOLVE ONE TABLET UNDER THE TONGUE EVERY 5 MINUTES AS NEEDED FOR CHEST PAIN.  DO NOT EXCEED A TOTAL OF 3 DOSES IN 15 MINUTES 11/20/16  Yes Troy Sine, MD  ramipril (ALTACE) 10 MG capsule Take 1 capsule by mouth once daily 03/25/21  Yes Troy Sine, MD  ranolazine (RANEXA) 1000 MG SR tablet TAKE 1  BY MOUTH TWICE DAILY 03/17/21  Yes Troy Sine, MD  rosuvastatin (CRESTOR) 20 MG tablet TAKE 1 TABLET BY MOUTH ONCE DAILY IN THE EVENING 04/07/21  Yes Troy Sine, MD   Social History   Socioeconomic History   Marital status: Married    Spouse name: Not on file   Number of children: Not on file   Years of education: 16   Highest education level: Some college, no degree  Occupational History   Occupation: Retired  Tobacco Use   Smoking status: Never    Smokeless tobacco: Never  Vaping Use   Vaping Use: Never used  Substance and Sexual Activity   Alcohol use: No    Alcohol/week: 0.0 standard drinks   Drug use: No   Sexual activity: Never  Other Topics Concern   Not on file  Social History Narrative   Married.    Social Determinants of Health   Financial Resource Strain: Not on file  Food Insecurity: Not on file  Transportation Needs: Not on file  Physical Activity: Not on file  Stress: Not on file  Social Connections: Not on file  Intimate Partner Violence: Not on file    Review of Systems Per HPI.   Objective:   Vitals:   04/21/21 1148  BP: 110/62  Pulse: 64  Temp: 98 F (36.7 C)  TempSrc: Oral  SpO2: 98%  Weight: 174 lb 9.6 oz (79.2 kg)  Height: 5\' 8"  (1.727 m)     Physical Exam Vitals reviewed.  Constitutional:      General: He is not in acute distress.    Appearance: Normal appearance. He is well-developed. He is not toxic-appearing or diaphoretic.  HENT:     Head: Normocephalic and atraumatic.  Neck:     Vascular: No carotid bruit or JVD.  Cardiovascular:     Rate and Rhythm: Normal rate and regular rhythm.     Heart sounds: Normal heart sounds. No murmur heard. Pulmonary:     Effort: Pulmonary effort is normal. No respiratory distress.     Breath sounds: Normal breath sounds. No wheezing, rhonchi or rales.     Comments: Speaking in full sentences, no respiratory distress, possible faint coarse breath sounds at the base only but overall clear. Musculoskeletal:     Right lower leg: No edema.     Left lower leg: No edema.  Skin:    General: Skin is warm and dry.  Neurological:     General: No focal deficit present.     Mental Status: He is alert and oriented to person, place, and time.  Psychiatric:        Mood and Affect: Mood normal.        Behavior: Behavior normal.       Assessment & Plan:  Bradley Oconnor is a 81 y.o. male . Balance problem History of fall  -Borderline  hypokalemia improved on repeat testing.  No focal weakness, no new headache other than COVID symptoms.  Most recent fall likely due to relative repletion during COVID with diarrhea.  Recommended neurology eval, declined at this time.  Advised to let me know when he is ready for that referral and I will place one.  Fall precautions discussed at home with RTC/ER precautions.  COVID-19 - Plan: benzonatate (TESSALON) 100 MG capsule  -Status post antiviral as above with some worsening symptoms approximately 2 days later.  Appears to be rebound COVID.  O2 sat reassuring, no dyspnea or chest pains.  Drinking, eating, no confusion.  Cough overall has improved.  Approximately day 2 of rebound COVID symptoms.   -Symptomatic care discussed with fluids, rest, Tylenol, Mucinex, Tessalon if needed for cough.  Restart of isolation/masking precautions.  Video follow-up visit in 48 hours with ER/urgent care precautions if any worsening during that time.  Understanding expressed.  Meds ordered this encounter  Medications   benzonatate (TESSALON) 100 MG capsule    Sig: Take 1 capsule (100 mg total) by mouth 3 (three) times daily as needed for cough.    Dispense:  20 capsule    Refill:  0   Patient Instructions  I do recommend evaluation with neurology - let me know when we can place that referral. Return to the clinic or go to the nearest emergency room if any of your symptoms worsen or new symptoms occur.  Based on recent symptoms you may have the condition rebound COVID.  This should be milder than the first episode.  No new medication/antivirals at this time but if any worsening of symptoms including shortness of breath, chest pain, or continued worsening symptoms be seen through urgent care or emergency room as we discussed.  Continue Tylenol as needed for body aches, make sure to drink plenty of fluids.  Mucinex if needed for cough or Tessalon [place]  if needed at bedtime to help suppress cough.   Follow-up for  video visit in 2 days and we can check and see how you are doing at that time.  Depending on her visit in a couple of days we can discuss follow-up for the unsteadiness/balance issues as well as repeat blood work if needed.    Fall Prevention in the Home, Adult Falls can cause injuries and affect people of all ages. There are many simple things that you can do to make your home safe and to help prevent falls. Ask for help when making these changes, if needed. What actions can I take to prevent falls? General instructions Use good lighting in all rooms. Replace any light bulbs that burn out, turn on lights if it is dark, and use night-lights. Place frequently used items in easy-to-reach places. Lower the shelves around your home if necessary. Set up furniture so that there are clear paths around it. Avoid moving your furniture around. Remove throw rugs and other tripping hazards from the floor. Avoid walking on wet floors. Fix any uneven floor surfaces. Add color or contrast paint or tape to grab bars and handrails in your home. Place contrasting color strips on the first and last steps of staircases. When you use a stepladder, make sure that it is completely opened and that the sides and supports are firmly locked. Have someone hold the ladder while you are using it. Do not climb a closed stepladder. Know where your pets are when moving through your home. What can I do in the bathroom?   Keep the floor dry. Immediately clean up any water that is on the floor. Remove soap buildup in the tub or shower regularly. Use nonskid mats or decals on the floor of the tub or shower. Attach bath mats securely with double-sided, nonslip rug tape. If you need to sit down while you are in the shower, use a plastic, nonslip stool. Install grab bars by the toilet and in the tub and shower. Do not use towel bars as grab bars. What can I do in the bedroom? Make sure that a bedside light is easy to reach. Do  not use oversized bedding that reaches the floor. Have a firm chair that has side arms to use for getting dressed. What can I do in the kitchen? Clean up any spills right away. If you need to reach for something above you, use a sturdy step stool that has a grab bar. Keep electrical cables out of the way. Do not use floor polish or wax that makes floors slippery. If you must use wax, make sure that it is non-skid floor wax. What can I do with my stairs? Do not leave any items on the stairs. Make sure that you have a light switch at the top and the bottom of the stairs. Have them installed if you do not have them. Make sure that there are handrails on both sides of the stairs. Fix handrails that are broken or loose. Make sure that handrails are as long as the staircases. Install non-slip stair treads on all stairs in your home. Avoid having throw rugs at the top or bottom of stairs, or secure the rugs with carpet tape to prevent them from moving. Choose a carpet design that does not hide the edge of steps on the stairs. Check any carpeting to make sure that it is firmly attached to the stairs. Fix any carpet that is loose or worn. What can I do  on the outside of my home? Use bright outdoor lighting. Regularly repair the edges of walkways and driveways and fix any cracks. Remove high doorway thresholds. Trim any shrubbery on the main path into your home. Regularly check that handrails are securely fastened and in good repair. Both sides of all steps should have handrails. Install guardrails along the edges of any raised decks or porches. Clear walkways of debris and clutter, including tools and rocks. Have leaves, snow, and ice cleared regularly. Use sand or salt on walkways during winter months. In the garage, clean up any spills right away, including grease or oil spills. What other actions can I take? Wear closed-toe shoes that fit well and support your feet. Wear shoes that have rubber  soles or low heels. Use mobility aids as needed, such as canes, walkers, scooters, and crutches. Review your medicines with your health care provider. Some medicines can cause dizziness or changes in blood pressure, which increase your risk of falling. Talk with your health care provider about other ways that you can decrease your risk of falls. This may include working with a physical therapist or trainer to improve your strength, balance, and endurance. Where to find more information Centers for Disease Control and Prevention, [employer]: http://www.wolf.info/ National Institute on Aging: http://kim-miller.com/ Contact a health care provider if: You are afraid of falling at home. You feel weak, drowsy, or dizzy at home. You fall at home. Summary There are many simple things that you can do to make your home safe and to help prevent falls. Ways to make your home safe include removing tripping hazards and installing grab bars in the bathroom. Ask for help when making these changes in your home. This information is not intended to replace advice given to you by your health care provider. Make sure you discuss any questions you have with your health care provider. Document Revised: 10/18/2019 Document Reviewed: 10/18/2019 Elsevier Patient Education  [place].  Dizziness Dizziness is a common problem. It is a feeling of unsteadiness or light-headedness. You may feel like you are about to faint. Dizziness can lead to injury if you stumble or fall. Anyone can become dizzy, but dizziness is more common in older adults. This condition can be caused by a number of things, including medicines, dehydration, or illness. Follow these instructions at home: Eating and drinking  Drink enough fluid to keep your urine pale yellow. This helps to keep you from becoming dehydrated. Try to drink more clear fluids, such as water. Do not drink alcohol. Limit your caffeine intake if told to do so by your health care  provider. Check ingredients and nutrition facts to see if a food or beverage contains caffeine. Limit your salt (sodium) intake if told to do so by your health care provider. Check ingredients and nutrition facts to see if a food or beverage contains sodium. Activity  Avoid making quick movements. Rise slowly from chairs and steady yourself until you feel okay. In the morning, first sit up on the side of the bed. When you feel okay, stand slowly while you hold onto something until you know that your balance is good. If you need to stand in one place for a long time, move your legs often. Tighten and relax the muscles in your legs while you are standing. Do not drive or use machinery if you feel dizzy. Avoid bending down if you feel dizzy. Place items in your home so that they are easy for you to reach without  leaning over. Lifestyle Do not use any products that contain nicotine or tobacco. These products include cigarettes, chewing tobacco, and vaping devices, such as e-cigarettes. If you need help quitting, ask your health care provider. Try to reduce your stress level by using methods such as yoga or meditation. Talk with your health care provider if you need help to manage your stress. General instructions Watch your dizziness for any changes. Take over-the-counter and prescription medicines only as told by your health care provider. Talk with your health care provider if you think that your dizziness is caused by a medicine that you are taking. Tell a friend or a family member that you are feeling dizzy. If he or she notices any changes in your behavior, have this person call your health care provider. Keep all follow-up visits. This is important. Contact a health care provider if: Your dizziness does not go away or you have new symptoms. Your dizziness or light-headedness gets worse. You feel nauseous. You have reduced hearing. You have a fever. You have neck pain or a stiff neck. Your  dizziness leads to an injury or a fall. Get help right away if: You vomit or have diarrhea and are unable to eat or drink anything. You have problems talking, walking, swallowing, or using your arms, hands, or legs. You feel generally weak. You have any bleeding. You are not thinking clearly or you have trouble forming sentences. It may take a friend or family member to notice this. You have chest pain, abdominal pain, shortness of breath, or sweating. Your vision changes or you develop a severe headache. These symptoms may represent a serious problem that is an emergency. Do not wait to see if the symptoms will go away. Get medical help right away. Call your local emergency services (911 in the [place] Do not drive yourself to the hospital. Summary Dizziness is a feeling of unsteadiness or light-headedness. This condition can be caused by a number of things, including medicines, dehydration, or illness. Anyone can become dizzy, but dizziness is more common in older adults. Drink enough fluid to keep your urine pale yellow. Do not drink alcohol. Avoid making quick movements if you feel dizzy. Monitor your dizziness for any changes. This information is not intended to replace advice given to you by your health care provider. Make sure you discuss any questions you have with your health care provider. Document Revised: 02/19/2020 Document Reviewed: 02/19/2020 Elsevier Patient Education  2022 [place],   Merri Ray, MD [place] 04/21/21 12:41 PM

## 2021-05-06 NOTE — Progress Notes (Signed)
Chronic Care Management Pharmacy Note  05/14/2021 Name:  Bradley Oconnor MRN:  [id number] DOB:  Jul 06, 1940  Summary: Initial visit with PharmD.  No current issues with medications.  Recovered from [facility].  Recommendations/Changes made from today's visit: None at this time  Plan: FU 1 year   Subjective: Bradley Oconnor is an 81 y.o. year old male who is a primary patient of Carlota Raspberry, Ranell Patrick, MD.  The CCM team was consulted for assistance with disease management and care coordination needs.    Engaged with patient by telephone for initial visit in response to provider referral for pharmacy case management and/or care coordination services.   Consent to Services:  The patient was given the following information about Chronic Care Management services today, agreed to services, and gave verbal consent: 1. CCM service includes personalized support from designated clinical staff supervised by the primary care provider, including individualized plan of care and coordination with other care providers 2. 24/7 contact phone numbers for assistance for urgent and routine care needs. 3. Service will only be billed when office clinical staff spend 20 minutes or more in a month to coordinate care. 4. Only one practitioner may furnish and bill the service in a calendar month. 5.The patient may stop CCM services at any time (effective at the end of the month) by phone call to the office staff. 6. The patient will be responsible for cost sharing (co-pay) of up to 20% of the service fee (after annual deductible is met). Patient agreed to services and consent obtained.  Patient Care Team: Wendie Agreste, MD as PCP - General (Family Medicine) Troy Sine, MD as Consulting Physician (Cardiology) Edythe Clarity, [facility] as Pharmacist (Pharmacist)  Recent office visits:  04/21/21 Merri Ray, MD - Family Medicine - Balance problems - benzonatate (TESSALON) 100 MG capsule prescribed. Referral to  Neurology placed. Follow up in 2 days.    04/11/21 Maximiano Coss, NP - Acute cough - COVID and Flu test performed. COVID test was positive. nirmatrelvir/ritonavir EUA, renal dosing, (PAXLOVID) 10 x 150 MG & 10 x 100MG TABS, fluticasone (FLONASE) 50 MCG/ACT nasal spray, and dextromethorphan-guaiFENesin (MUCINEX DM) 30-600 MG 12hr tablet prescribed.    03/03/21 Merri Ray, MD - Family Medicine - Balance problem - Labs were ordered. Follow up in 1 month.    Recent consult visits:  None noted.    Hospital visits:  None in previous 6 months     Objective:  Lab Results  Component Value Date   CREATININE 1.31 04/01/2021   BUN 18 04/01/2021   GFR 51.00 (L) 04/01/2021   GFRNONAA 67 06/18/2017   GFRAA 78 06/18/2017   NA 141 04/01/2021   K 3.4 (L) 04/01/2021   CALCIUM 8.6 04/01/2021   CO2 32 04/01/2021   GLUCOSE 101 (H) 04/01/2021    Lab Results  Component Value Date/Time   HGBA1C 6.2 03/03/2021 12:14 PM   HGBA1C 6.1 (A) 08/14/2019 12:00 PM   GFR 51.00 (L) 04/01/2021 01:10 PM   GFR 62.93 03/03/2021 12:14 PM    Last diabetic Eye exam: No results found for: HMDIABEYEEXA  Last diabetic Foot exam: No results found for: HMDIABFOOTEX   Lab Results  Component Value Date   CHOL 132 03/03/2021   HDL 42.50 03/03/2021   LDLCALC 65 03/03/2021   TRIG 124.0 03/03/2021   CHOLHDL 3 03/03/2021    Hepatic Function Latest Ref Rng & Units 03/03/2021 06/18/2017 04/29/2016  Total Protein 6.0 - 8.3 g/dL 6.9 7.4 6.8  Albumin 3.5 - 5.2 g/dL 4.0 4.1 3.5(L)  AST 0 - 37 U/L 14 21 24   ALT 0 - 53 U/L 12 22 34  Alk Phosphatase 39 - 117 U/L 46 54 53  Total Bilirubin 0.2 - 1.2 mg/dL 1.2 0.9 0.9    Lab Results  Component Value Date/Time   TSH 0.65 03/03/2021 12:14 PM   TSH 0.895 06/18/2017 12:33 PM    CBC Latest Ref Rng & Units 03/03/2021 06/18/2017 04/29/2016  WBC 4.0 - 10.5 K/uL 6.5 7.3 7.3  Hemoglobin 13.0 - 17.0 g/dL 13.0 13.6 13.4  Hematocrit 39.0 - 52.0 % 38.9(L) 41.7 40.7  Platelets 150.0  - 400.0 K/uL 159.0 156 196    No results found for: VD25OH  Clinical ASCVD: Yes  The ASCVD Risk score (Arnett DK, et al., 2019) failed to calculate for the following reasons:   The 2019 ASCVD risk score is only valid for ages 68 to 58    Depression screen PHQ 2/9 03/03/2021 08/14/2019 06/21/2019  Decreased Interest 0 0 0  Down, Depressed, Hopeless 0 0 0  PHQ - 2 Score 0 0 0     Social History   Tobacco Use  Smoking Status Never  Smokeless Tobacco Never   BP Readings from Last 3 Encounters:  04/21/21 110/62  04/11/21 128/76  03/03/21 130/76   Pulse Readings from Last 3 Encounters:  04/21/21 64  04/11/21 75  03/03/21 63   Wt Readings from Last 3 Encounters:  04/21/21 174 lb 9.6 oz (79.2 kg)  03/03/21 185 lb 6.4 oz (84.1 kg)  10/10/20 179 lb (81.2 kg)   BMI Readings from Last 3 Encounters:  04/21/21 26.55 kg/m  04/11/21 28.19 kg/m  03/03/21 28.19 kg/m    Assessment/Interventions: Review of patient past medical history, allergies, medications, health status, including review of consultants reports, laboratory and other test data, was performed as part of comprehensive evaluation and provision of chronic care management services.   SDOH:  (Social Determinants of Health) assessments and interventions performed: Yes  Financial Resource Strain: Low Risk    Difficulty of Paying Living Expenses: Not hard at all   Food Insecurity: No Food Insecurity   Worried About Charity fundraiser in the Last Year: Never true   Arboriculturist in the Last Year: Never true    SDOH Screenings   Alcohol Screen: Not on file  Depression (PHQ2-9): Low Risk    PHQ-2 Score: 0  Financial Resource Strain: Low Risk    Difficulty of Paying Living Expenses: Not hard at all  Food Insecurity: No Food Insecurity   Worried About Charity fundraiser in the Last Year: Never true   Ran Out of Food in the Last Year: Never true  Housing: Not on file  Physical Activity: Not on file  Social  Connections: Not on file  Stress: Not on file  Tobacco Use: Low Risk    Smoking Tobacco Use: Never   Smokeless Tobacco Use: Never   Passive Exposure: Not on file  Transportation Needs: Not on file    [place]  Allergies  Allergen Reactions   Procardia [Nifedipine] Other (See Comments)    Lowers bp    Phenergan [Promethazine Hcl] Nausea And Vomiting    Medications Reviewed Today     Reviewed by Edythe Clarity, [facility] (Pharmacist) on 05/14/21 at [place] List Status: <None>   Medication Order Taking? Sig Documenting Provider Last Dose Status Informant  amLODipine (NORVASC) 5 MG tablet  [id number] Yes Take 1 tablet by mouth once daily Troy Sine, MD Taking Active   aspirin EC 81 MG tablet [id number] Yes Take 1 tablet (81 mg total) by mouth daily. Troy Sine, MD Taking Active   dextromethorphan-guaiFENesin [facility] DM) 30-600 MG 12hr tablet [id number] Yes Take 1 tablet by mouth 2 (two) times daily. Maximiano Coss, NP Taking Active   esomeprazole (NEXIUM) 20 MG capsule [id number] Yes Take 20 mg by mouth daily at 12 noon. [provider] Taking Active   fish oil-omega-3 fatty acids 1000 MG capsule [id number] Yes Take 1 g by mouth daily.  [provider] Taking Active Self  fluticasone (FLONASE) 50 MCG/ACT nasal spray [id number] Yes Place 2 sprays into both nostrils daily. Maximiano Coss, NP Taking Active   Garlic 2947 MG CAPS [id number] Yes Take 1 capsule by mouth daily.  [provider] Taking Active Self  hydrochlorothiazide (MICROZIDE) 12.5 MG capsule [id number] Yes Take 1 capsule (12.5 mg total) by mouth daily. Troy Sine, MD Taking Active   isosorbide mononitrate (IMDUR) 120 MG 24 hr tablet [id number] Yes Take 1 tablet (120 mg total) by mouth daily. KEEP OV. Troy Sine, MD Taking Active   levothyroxine (SYNTHROID) 100 MCG tablet [id number] Yes TAKE 1 TABLET BY MOUTH IN THE MORNING **NEED  OFFICE  VISITTroy Sine, MD Taking Active    levothyroxine (SYNTHROID, LEVOTHROID) 100 MCG tablet [id number] Yes TAKE 1 TABLET BY MOUTH IN THE MORNING BEFORE BREAKFAST Troy Sine, MD Taking Active   metoprolol succinate (TOPROL-XL) 100 MG 24 hr tablet [id number] Yes Take 1 tablet (100 mg total) by mouth daily. PATIENT NEEDS TO SCHEDULE AN CARDIOLOGY APPOINTMENT IN ORDER TO RECEIVE FUTURE REFILLS ON MEDICATION. Troy Sine, MD Taking Active   nitroGLYCERIN (NITROSTAT) 0.4 MG SL tablet [id number] Yes DISSOLVE ONE TABLET UNDER THE TONGUE EVERY 5 MINUTES AS NEEDED FOR CHEST PAIN.  DO NOT EXCEED A TOTAL OF 3 DOSES IN 15 MINUTES Troy Sine, MD Taking Active   NITROSTAT 0.4 MG SL tablet [id number] Yes DISSOLVE ONE TABLET UNDER THE TONGUE EVERY 5 MINUTES AS NEEDED FOR CHEST PAIN.  DO NOT EXCEED A TOTAL OF 3 DOSES IN 15 MINUTES Troy Sine, MD Taking Active   ramipril (ALTACE) 10 MG capsule [id number] Yes Take 1 capsule by mouth once daily Troy Sine, MD Taking Active   ranolazine (RANEXA) 1000 MG SR tablet [id number] Yes TAKE 1  BY MOUTH TWICE DAILY Troy Sine, MD Taking Active   rosuvastatin (CRESTOR) 20 MG tablet [id number] Yes TAKE 1 TABLET BY MOUTH ONCE DAILY IN THE Marcello Fennel, MD Taking Active             Patient Active Problem List   Diagnosis Date Noted   Low back pain 08/07/2020   Trigger finger of right thumb 04/17/2020   Carpal tunnel syndrome, bilateral 09/26/2019   Shoulder pain, bilateral 08/03/2019   Cervicalgia 03/28/2018   GERD (gastroesophageal reflux disease) 04/10/2013   HTN (hypertension) 04/10/2013   CAD (coronary artery disease) of artery bypass graft 10/04/2012   Hypothyroidism 10/04/2012   Hyperlipidemia with target LDL less than 70 10/04/2012   Kidney stones 01/27/2012   Gallstones-symptomatic 01/27/2012   Hx of appendectomy-history of ruptured requiring ileocecectomy (~1994) 01/27/2012    Immunization History  Administered Date(s) Administered   Fluad Quad(high Dose 65+)  03/17/2019, 01/17/2020, 02/27/2021   Influenza Split 12/09/2011   Influenza, High Dose Seasonal PF 02/10/2018   Influenza,inj,Quad PF,6+  Mos 02/01/2013, 02/01/2014, 01/17/2015, 12/17/2015, 12/31/2016   PFIZER(Purple Top)SARS-COV-2 Vaccination 06/13/2019, 07/04/2019   PNEUMOCOCCAL CONJUGATE-20 02/27/2021   Pneumococcal Polysaccharide-23 04/17/2014   Tdap 06/09/2019    Conditions to be addressed/monitored:  HTN, CAD, GERD, Hypothyroidism, HLD  Care Plan : General Pharmacy (Adult)  Updates made by Edythe Clarity, RPH since 05/14/2021 12:00 AM     Problem: HTN, CAD, GERD, Hypothyroidism, HLD   Priority: High  Onset Date: 05/14/2021     Long-Range Goal: Patient-Specific Goal   Start Date: 05/14/2021  Expected End Date: 05/14/2022  This Visit's Progress: On track  Priority: High  Note:   Current Barriers:  None identified  Pharmacist Clinical Goal(s):  Patient will maintain control of BP/LDL as evidenced by labs/monitoring  through collaboration with PharmD and provider.   Interventions: 1:1 collaboration with Wendie Agreste, MD regarding development and update of comprehensive plan of care as evidenced by provider attestation and co-signature Inter-disciplinary care team collaboration (see longitudinal plan of care) Comprehensive medication review performed; medication list updated in electronic medical record  Hypertension (BP goal <130/80) -Controlled -Current treatment: Ramipril 60m daily Appropriate, Effective, Safe, Accessible Amlodipine 544mdaily Appropriate, Effective, Safe, Accessible HCTZ 12.57m61maily Appropriate, Effective, Safe, Accessible Isosorbide 120m66mhr daily Appropriate, Effective, Safe, Accessible Metoprolol XL 100mg93mpropriate, Effective, Safe, Accessible -Medications previously tried: Procardia (N/V),  -Current home readings: not checking much at home since COVID -Current dietary habits: occasional walking -Current exercise habits: not as much  since COVID -Denies hypotensive/hypertensive symptoms -Educated on BP goals and benefits of medications for prevention of heart attack, stroke and kidney damage; Exercise goal of 150 minutes per week; Proper BP monitoring technique; Symptoms of hypotension and importance of maintaining adequate hydration; -Counseled to monitor BP at home once weekly, document, and provide log at future appointments -Counseled on diet and exercise extensively Recommended to continue current medication Patient followed by cardiology says his BP meds are balanced perfect right now. No changes at this time, continue current meds.  Hyperlipidemia: (LDL goal < 70) -Controlled -Current treatment: Rosuvastatin 20mg 39my Appropriate, Effective, Safe, Accessible -Medications previously tried: none noted  -Current dietary patterns: eats well, in home nurse cooks some.  Working on limiting his carbs/sugars -Current exercise habits: has decreased some since COVID diagnosis -Educated on Cholesterol goals;  Benefits of statin for ASCVD risk reduction; Importance of limiting foods high in cholesterol; -Recommended to continue current medication Continue routine lipid screen - no changes at this time.  Hypothyroidism (Goal: Maintain TSH) -Controlled -Current treatment  Levothyroxine 100mcg 47my Appropriate, Effective, Safe, Accessible -Medications previously tried: none noted -Takes correctly first thing in AM on empty stomach, most recent TSH is WNL  -Recommended to continue current medication No changes at this time  GERD (Goal: Prevent acid reflux) -Controlled -Current treatment  Nexium 20mg da31mQuery Appropriate, -Medications previously tried: Prevacid  -Continues to take daily, has taken long term.  He does have symptoms when he tries to stop medication. -Recommended to continue current medication Will continue to work on triggers - would love to try to taper off in the future.  Patient  Goals/Self-Care Activities Patient will:  - take medications as prescribed as evidenced by patient report and record review engage in dietary modifications by limiting carbohydrates and sugars  Follow Up Plan: The care management team will reach out to the patient again over the next 365 days.       Medication Assistance: None required.  Patient affirms current coverage meets needs.  Compliance/Adherence/Medication  fill history: Care Gaps: None identified  Star-Rating Drugs: Rosuvastatin 64m 04/07/21  Patient[place] [facility]  [id number]: [phone]: [phone] Uses pill box? No - organizes vials Pt endorses 100% compliance  We discussed: Benefits of medication synchronization, packaging and delivery as well as enhanced pharmacist oversight with [employer]. Patient decided to: Continue current medication management strategy  Care Plan and Follow Up Patient Decision:  Patient agrees to Care Plan and Follow-up.  Plan: The care management team will reach out to the patient again over the next 365 days.  CBeverly Milch PharmD Clinical Pharmacist  [facility]([phone]

## 2021-05-12 ENCOUNTER — Telehealth: Payer: Self-pay | Admitting: Pharmacist

## 2021-05-12 NOTE — Progress Notes (Signed)
Chronic Care Management Pharmacy Assistant   Name: Bradley Oconnor  MRN: [id number] DOB: [birth year]  Bradley Oconnor is an 81 y.o. year old male who presents for his initial CCM visit with the clinical pharmacist.  Reason for Encounter: Chart Prep for Initial visit on 05/14/21 @ 9:30am    Recent office visits:  04/21/21 Merri Ray, MD - Family Medicine - Balance problems - benzonatate (TESSALON) 100 MG capsule prescribed. Referral to Neurology placed. Follow up in 2 days.   04/11/21 Maximiano Coss, NP - Acute cough - COVID and Flu test performed. COVID test was positive. nirmatrelvir/ritonavir EUA, renal dosing, (PAXLOVID) 10 x 150 MG & 10 x 100MG  TABS, fluticasone (FLONASE) 50 MCG/ACT nasal spray, and dextromethorphan-guaiFENesin (MUCINEX DM) 30-600 MG 12hr tablet prescribed.   03/03/21 Merri Ray, MD - Family Medicine - Balance problem - Labs were ordered. Follow up in 1 month.   Recent consult visits:  None noted.   Hospital visits:  None in previous 6 months  Medications: Outpatient Encounter Medications as of 05/12/2021  Medication Sig   amLODipine (NORVASC) 5 MG tablet Take 1 tablet by mouth once daily   aspirin EC 81 MG tablet Take 1 tablet (81 mg total) by mouth daily.   benzonatate (TESSALON) 100 MG capsule Take 1 capsule (100 mg total) by mouth 3 (three) times daily as needed for cough.   dextromethorphan-guaiFENesin (MUCINEX DM) 30-600 MG 12hr tablet Take 1 tablet by mouth 2 (two) times daily.   fish oil-omega-3 fatty acids 1000 MG capsule Take 1 g by mouth daily.    fluticasone (FLONASE) 50 MCG/ACT nasal spray Place 2 sprays into both nostrils daily.   Garlic 5638 MG CAPS Take 1 capsule by mouth daily.    hydrochlorothiazide (MICROZIDE) 12.5 MG capsule Take 1 capsule (12.5 mg total) by mouth daily.   isosorbide mononitrate (IMDUR) 120 MG 24 hr tablet Take 1 tablet by mouth once daily   lansoprazole (PREVACID) 15 MG capsule Take 15 mg by mouth daily.    levothyroxine (SYNTHROID) 100 MCG tablet TAKE 1 TABLET BY MOUTH IN THE MORNING **NEED  OFFICE  VISIT**   levothyroxine (SYNTHROID, LEVOTHROID) 100 MCG tablet TAKE 1 TABLET BY MOUTH IN THE MORNING BEFORE BREAKFAST   metoprolol succinate (TOPROL-XL) 100 MG 24 hr tablet Take 1 tablet (100 mg total) by mouth daily. PATIENT NEEDS TO SCHEDULE AN CARDIOLOGY APPOINTMENT IN ORDER TO RECEIVE FUTURE REFILLS ON MEDICATION.   nitroGLYCERIN (NITROSTAT) 0.4 MG SL tablet DISSOLVE ONE TABLET UNDER THE TONGUE EVERY 5 MINUTES AS NEEDED FOR CHEST PAIN.  DO NOT EXCEED A TOTAL OF 3 DOSES IN 15 MINUTES   NITROSTAT 0.4 MG SL tablet DISSOLVE ONE TABLET UNDER THE TONGUE EVERY 5 MINUTES AS NEEDED FOR CHEST PAIN.  DO NOT EXCEED A TOTAL OF 3 DOSES IN 15 MINUTES   ramipril (ALTACE) 10 MG capsule Take 1 capsule by mouth once daily   ranolazine (RANEXA) 1000 MG SR tablet TAKE 1  BY MOUTH TWICE DAILY   rosuvastatin (CRESTOR) 20 MG tablet TAKE 1 TABLET BY MOUTH ONCE DAILY IN THE EVENING   No facility-administered encounter medications on file as of 05/12/2021.    Have you seen any other providers since your last visit? no  Any changes in your medications or health? no  Any side effects from any medications? no  Do you have an symptoms or problems not managed by your medications? no  Any concerns about your health right now? no  Has your provider  asked that you check blood pressure, blood sugar, or follow special diet at home? no  Do you get any type of exercise on a regular basis? yes  Can you think of a goal you would like to reach for your health? Patient could not think of anything currently.   Do you have any problems getting your medications? no  Is there anything that you would like to discuss during the appointment? Patient did not have   Please bring medications and supplements to appointment  Patient confirmed appointment date and time.   Care Gaps  AWV: overdue Colonoscopy: unknown (? Aged out) DM Eye  Exam: N/A DM Foot Exam:  N/A Microalbumin: N/A HbgAIC: done 03/03/21 (6.2) DEXA: N/A Mammogram: N/A   Star Rating Drugs: rosuvastatin (CRESTOR) 20 MG tablet - last filled 04/07/21 90 days  ramipril (ALTACE) 10 MG capsule - last filled 03/25/21 90 days   Future Appointments  Date Time Provider Dawn  05/14/2021  9:30 AM [facility] CCM PHARMACIST [facility]  07/16/2021  2:20 PM Troy Sine, MD [facility] [place] [place] Pharmacist Assistant  [phone]

## 2021-05-13 ENCOUNTER — Other Ambulatory Visit: Payer: Self-pay | Admitting: Cardiovascular Disease

## 2021-05-14 ENCOUNTER — Ambulatory Visit (INDEPENDENT_AMBULATORY_CARE_PROVIDER_SITE_OTHER): Payer: PPO | Admitting: Pharmacist

## 2021-05-14 DIAGNOSIS — E785 Hyperlipidemia, unspecified: Secondary | ICD-10-CM

## 2021-05-14 DIAGNOSIS — I25709 Atherosclerosis of coronary artery bypass graft(s), unspecified, with unspecified angina pectoris: Secondary | ICD-10-CM

## 2021-05-14 DIAGNOSIS — I10 Essential (primary) hypertension: Secondary | ICD-10-CM

## 2021-05-14 DIAGNOSIS — E039 Hypothyroidism, unspecified: Secondary | ICD-10-CM

## 2021-05-14 NOTE — Patient Instructions (Addendum)
Visit Information   Goals Addressed             This Visit's Progress    Track and Manage My Blood Pressure-Hypertension       Timeframe:  Long-Range Goal Priority:  High Start Date: 05/14/21                            Expected End Date: 11/11/21                      Follow Up Date 08/11/21    - check blood pressure weekly - choose a place to take my blood pressure (home, clinic or office, retail store) - write blood pressure results in a log or diary    Why is this important?   You won't feel high blood pressure, but it can still hurt your blood vessels.  High blood pressure can cause heart or kidney problems. It can also cause a stroke.  Making lifestyle changes like losing a little weight or eating less salt will help.  Checking your blood pressure at home and at different times of the day can help to control blood pressure.  If the doctor prescribes medicine remember to take it the way the doctor ordered.  Call the office if you cannot afford the medicine or if there are questions about it.     Notes:        Patient Care Plan: General Pharmacy (Adult)     Problem Identified: HTN, CAD, GERD, Hypothyroidism, HLD   Priority: High  Onset Date: 05/14/2021     Long-Range Goal: Patient-Specific Goal   Start Date: 05/14/2021  Expected End Date: 05/14/2022  This Visit's Progress: On track  Priority: High  Note:   Current Barriers:  None identified  Pharmacist Clinical Goal(s):  Patient will maintain control of BP/LDL as evidenced by labs/monitoring  through collaboration with PharmD and provider.   Interventions: 1:1 collaboration with Wendie Agreste, MD regarding development and update of comprehensive plan of care as evidenced by provider attestation and co-signature Inter-disciplinary care team collaboration (see longitudinal plan of care) Comprehensive medication review performed; medication list updated in electronic medical record  Hypertension (BP goal  <130/80) -Controlled -Current treatment: Ramipril 10mg  daily Appropriate, Effective, Safe, Accessible Amlodipine 5mg  daily Appropriate, Effective, Safe, Accessible HCTZ 12.5mg  daily Appropriate, Effective, Safe, Accessible Isosorbide 120mg  24hr daily Appropriate, Effective, Safe, Accessible Metoprolol XL 100mg   Appropriate, Effective, Safe, Accessible -Medications previously tried: Procardia (N/V),  -Current home readings: not checking much at home since COVID -Current dietary habits: occasional walking -Current exercise habits: not as much since COVID -Denies hypotensive/hypertensive symptoms -Educated on BP goals and benefits of medications for prevention of heart attack, stroke and kidney damage; Exercise goal of 150 minutes per week; Proper BP monitoring technique; Symptoms of hypotension and importance of maintaining adequate hydration; -Counseled to monitor BP at home once weekly, document, and provide log at future appointments -Counseled on diet and exercise extensively Recommended to continue current medication Patient followed by cardiology says his BP meds are balanced perfect right now. No changes at this time, continue current meds.  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  Working on limiting his carbs/sugars -Current exercise habits: has decreased some since COVID diagnosis -Educated on Cholesterol goals;  Benefits of statin for  ASCVD risk reduction; Importance of limiting foods high in cholesterol; -Recommended to continue current medication Continue routine lipid screen - no changes at this time.  Hypothyroidism (Goal: Maintain TSH) -Controlled -Current treatment  Levothyroxine 175mcg daily Appropriate, Effective, Safe, Accessible -Medications previously tried: none  noted -Takes correctly first thing in AM on empty stomach, most recent TSH is WNL  -Recommended to continue current medication No changes at this time  GERD (Goal: Prevent acid reflux) -Controlled -Current treatment  Nexium 20mg  daily Query Appropriate, -Medications previously tried: Prevacid  -Continues to take daily, has taken long term.  He does have symptoms when he tries to stop medication. -Recommended to continue current medication Will continue to work on triggers - would love to try to taper off in the future.  Patient Goals/Self-Care Activities Patient will:  - take medications as prescribed as evidenced by patient report and record review engage in dietary modifications by limiting carbohydrates and sugars  Follow Up Plan: The care management team will reach out to the patient again over the next 365 days.      Bradley Oconnor was given information about Chronic Care Management services today including:  CCM service includes personalized support from designated clinical staff supervised by his physician, including individualized plan of care and coordination with other care providers 24/7 contact phone numbers for assistance for urgent and routine care needs. Standard insurance, coinsurance, copays and deductibles apply for chronic care management only during months in which we provide at least 20 minutes of these services. Most insurances cover these services at 100%, however patients may be responsible for any copay, coinsurance and/or deductible if applicable. This service may help you avoid the need for more expensive face-to-face services. Only one practitioner may furnish and bill the service in a calendar month. The patient may stop CCM services at any time (effective at the end of the month) by phone call to the office staff.  Patient agreed to services and verbal consent obtained.   The patient verbalized understanding of instructions, educational materials, and care  plan provided today and agreed to receive a mailed copy of patient instructions, educational materials, and care plan.  Telephone follow up appointment with pharmacy team member scheduled for:  Edythe Clarity, [place], PharmD Clinical Pharmacist  [facility] [phone]

## 2021-05-16 ENCOUNTER — Other Ambulatory Visit: Payer: Self-pay | Admitting: Cardiovascular Disease

## 2021-05-22 ENCOUNTER — Other Ambulatory Visit: Payer: Self-pay | Admitting: Cardiovascular Disease

## 2021-05-27 DIAGNOSIS — I10 Essential (primary) hypertension: Secondary | ICD-10-CM

## 2021-05-27 DIAGNOSIS — E785 Hyperlipidemia, unspecified: Secondary | ICD-10-CM | POA: Diagnosis not present

## 2021-05-27 DIAGNOSIS — I25709 Atherosclerosis of coronary artery bypass graft(s), unspecified, with unspecified angina pectoris: Secondary | ICD-10-CM | POA: Diagnosis not present

## 2021-05-27 DIAGNOSIS — E039 Hypothyroidism, unspecified: Secondary | ICD-10-CM | POA: Diagnosis not present

## 2021-06-09 DIAGNOSIS — H52203 Unspecified astigmatism, bilateral: Secondary | ICD-10-CM | POA: Diagnosis not present

## 2021-06-09 DIAGNOSIS — Z961 Presence of intraocular lens: Secondary | ICD-10-CM | POA: Diagnosis not present

## 2021-06-09 DIAGNOSIS — H26493 Other secondary cataract, bilateral: Secondary | ICD-10-CM | POA: Diagnosis not present

## 2021-06-16 ENCOUNTER — Other Ambulatory Visit: Payer: Self-pay | Admitting: Cardiovascular Disease

## 2021-06-23 DIAGNOSIS — M1990 Unspecified osteoarthritis, unspecified site: Secondary | ICD-10-CM | POA: Diagnosis not present

## 2021-06-23 DIAGNOSIS — I739 Peripheral vascular disease, unspecified: Secondary | ICD-10-CM | POA: Diagnosis not present

## 2021-06-23 DIAGNOSIS — K219 Gastro-esophageal reflux disease without esophagitis: Secondary | ICD-10-CM | POA: Diagnosis not present

## 2021-06-23 DIAGNOSIS — E785 Hyperlipidemia, unspecified: Secondary | ICD-10-CM | POA: Diagnosis not present

## 2021-06-23 DIAGNOSIS — G8929 Other chronic pain: Secondary | ICD-10-CM | POA: Diagnosis not present

## 2021-06-23 DIAGNOSIS — I25708 Atherosclerosis of coronary artery bypass graft(s), unspecified, with other forms of angina pectoris: Secondary | ICD-10-CM | POA: Diagnosis not present

## 2021-06-23 DIAGNOSIS — I10 Essential (primary) hypertension: Secondary | ICD-10-CM | POA: Diagnosis not present

## 2021-06-23 DIAGNOSIS — H547 Unspecified visual loss: Secondary | ICD-10-CM | POA: Diagnosis not present

## 2021-06-23 DIAGNOSIS — E663 Overweight: Secondary | ICD-10-CM | POA: Diagnosis not present

## 2021-06-23 DIAGNOSIS — E039 Hypothyroidism, unspecified: Secondary | ICD-10-CM | POA: Diagnosis not present

## 2021-06-23 DIAGNOSIS — I252 Old myocardial infarction: Secondary | ICD-10-CM | POA: Diagnosis not present

## 2021-06-23 DIAGNOSIS — G629 Polyneuropathy, unspecified: Secondary | ICD-10-CM | POA: Diagnosis not present

## 2021-06-25 DIAGNOSIS — H26491 Other secondary cataract, right eye: Secondary | ICD-10-CM | POA: Diagnosis not present

## 2021-06-26 ENCOUNTER — Other Ambulatory Visit: Payer: Self-pay | Admitting: Cardiovascular Disease

## 2021-07-16 ENCOUNTER — Encounter: Payer: Self-pay | Admitting: Cardiovascular Disease

## 2021-07-16 ENCOUNTER — Ambulatory Visit: Payer: PPO | Admitting: Cardiovascular Disease

## 2021-07-16 DIAGNOSIS — M25471 Effusion, right ankle: Secondary | ICD-10-CM | POA: Diagnosis not present

## 2021-07-16 DIAGNOSIS — I25709 Atherosclerosis of coronary artery bypass graft(s), unspecified, with unspecified angina pectoris: Secondary | ICD-10-CM

## 2021-07-16 DIAGNOSIS — M25472 Effusion, left ankle: Secondary | ICD-10-CM | POA: Diagnosis not present

## 2021-07-16 DIAGNOSIS — Z951 Presence of aortocoronary bypass graft: Secondary | ICD-10-CM | POA: Diagnosis not present

## 2021-07-16 DIAGNOSIS — E785 Hyperlipidemia, unspecified: Secondary | ICD-10-CM | POA: Diagnosis not present

## 2021-07-16 DIAGNOSIS — E039 Hypothyroidism, unspecified: Secondary | ICD-10-CM

## 2021-07-16 DIAGNOSIS — K219 Gastro-esophageal reflux disease without esophagitis: Secondary | ICD-10-CM

## 2021-07-16 DIAGNOSIS — I10 Essential (primary) hypertension: Secondary | ICD-10-CM

## 2021-07-16 NOTE — Progress Notes (Signed)
Patient ID: WETZEL Oconnor, male   DOB: Nov 25, 1940, 81 y.o.   MRN: [id number] ? ? ? ? ?HPI: Bradley Oconnor, is a 82 y.o. male who presents to the office for a 20 month follow-up cardiology evaluation. ? ?Bradley Oconnor has established CAD dating back to 1992 when he suffered an inferior wall myocardial infarction and underwent PTCA of a totally occluded RCA. In 1993 due to progressive CAD, he underwent CABG surgery with a LIMA to his LAD, vein graft sequentially to a diagonal and marginal vein graft to his PDA branch of his right carotid artery. In September 2002 a stent was placed the PLA of his RCA.  In June 2011, he suffered a non-ST segment elevation MI which was felt to be due to RCA graft occlusion which supplied the PDA and PLA vessel. The PDA was extensively collateralized now via the left circumflex territory. His native RCA was totally occluded at the mid level. His LIMA graft is widely patent as was the sequential graft to the diagonal marginal vessel. He has done well particularly with the addition of Ranexa titrated up to 1000 twice a day added to his medical regimen. He believes the Ranexa has made a huge difference in his anginal symptomatology. He is unaware of palpitations he denies presyncope or syncope. ? ?Additional problems include GERD, hyperlipidemia, hypertension. ? ?In 2015, he experienced 3 short-lived episodes of some mild chest pain.  He feels that his CAD is stable.  His wife unfortunately has suffered multiple small strokes and she is now fairly incapacitated and he spends much of his time caring for her.  As result, he has not been as active as he had in the past.  Over the past year, he denies recurrent anginal symptoms. He denies dizziness.  He he has been on ramipril 10 mg, amlodipine 5 mg, HCTZ 12.5 mg as needed for edema, in addition to Toprol-XL 125 mg and isosorbide 120 mg.  He denies bleeding on aspirin.  He has been taking 325 mg aspirin.  He also has been on ranolazine 1000 g  twice a day.  He is was recently switched by his insurance company to generic Crestor 20 mg daily, which he has been on for 30 days and also takes over-the-counter fish oil. He has hypothyroidism on Synthroid replacement and Prevacid for GERD.  ? ?He underwent an echo Doppler study on 07/07/2016.  This showed normal systolic function with an EF of 55-60%.  There was grade 1 diastolic dysfunction.  He had mild aortic sclerosis with trivial AR, mild to moderate mitral regurgitation, and had biatrial enlargement, left greater than right.  Peak PA pressure was 25 mm ? ?I  saw [name] at which time he remained stable.  He denies any recurrent chest pain.  He believes the addition of Ranexa to his medical regimen.  Several years ago was again change her and made dramatic improvement in his symptomatology.  He has not been able to exercise as regularly and often times does not sleep as well in caring for his disabled wife. She had suffered multiple strokes in addition has dementia. He is unaware of PND, orthopnea, palpitations, presyncope or syncope. ? ?I  saw him in March 2019 and last evaluated him in a telemedicine visit on July 01, 2018 due to COVID-19 pandemic.  Over the year prior to that evaluation he had continued to do remarkably well on an aggressive medical regimen consisting of metoprolol succinate 125 mg daily, amlodipine  5 mg, isosorbide 120 mg, and ranolazine 1000 mg twice a day.  Over the  8 months prior to the telemedicine visit he admitted to 3 short-lived episodes of chest discomfort which lasted approximately 10 to 15 minutes and ultimately went away on its own.  If he starts out walking more rapidly he may note some mild shortness of breath initially but this improves as he continues to walk.  He has continued to cut his grass at home and do yard work without typical anginal symptoms.  He does have periods of intermittent ankle swelling for which he has been taking HCTZ 12.5 mg as needed.    He also has issues with GERD and it is becoming more difficult to obtain Prevacid and as result he now is on Nexium and as long as he takes treatment he does not have reflux symptomatology.  He continues to care for his disabled wife who also has significant dementia.  She was hospitalized until early this week with recent pneumonia and urinary infection.  She is now back home.  As result he has not been able to exercise regularly due to caring for her.  He has continued to be on rosuvastatin for hyperlipidemia.  He continues to be on omega-3 fatty acid. Two years ago triglyceride levels were significantly elevated which did improve with omega-3 fatty acid therapy.  He has not had laboratory checked in a year.  He had recently developed significant neck pain and has undergone Cervical epidural cortisone injection which he states helped for approximately 2-1/2 weeks but his symptoms have recurred.  ? ?I saw him in the office in January 2021.  Since his last telemedicine evaluation, he has continued to feel well.  He has only required taking sublingual nitroglycerin on 2 occasions over the past 9 months.  His blood pressure has been stable.  He is unaware of palpitations presyncope or syncope.  He is continuing to care for his disabled wife.   ? ?I last saw him on November 23, 2019.  At that time he admitted to being somewhat more fatigued.  He has had purposeful weight loss of approximately 8 pounds.  He continues to care for his disabled wife.  He recently underwent bilateral Dupuytren contracture surgery by Dr. Durward Fortes as result of bilateral carpal tunnel syndrome.  He tolerated this well.  He continues to be on amlodipine 5 mg daily, HCTZ 12.5 mg on a as needed basis, metoprolol succinate now at the reduced dose of 100 mg in addition to ramipril 10 mg and ranolazine 1000 mg twice a day.  He is not having any anginal symptoms.  He is on rosuvastatin 20 mg for hyperlipidemia.  He had not had recent laboratory and  follow-up labs were recommended. ? ?Since his prior evaluation, he states that in January 2022 he had COVID on 2 separate occasions.  He has had some issues with balance.  He denies any recurrent chest pain he has noted some very mild swelling right ankle greater than left.  He continues to be on amlodipine 5 mg, HCTZ 12.5 mg, isosorbide 120 mg daily in addition to ramipril 10 mg and ranolazine 1000 mg twice a day.  He has been without anginal symptomatology.  He continues to be on rosuvastatin for hyperlipidemia.  Laboratory in December 2022 showed LDL cholesterol at 65.  Hemoglobin A1c was 6.2 consistent with prediabetes.  He is on levothyroxine 100 mcg for hypothyroidism.  He sees Dr. Cindee Lame for primary care.  He  presents for evaluation. ? ?Past Medical History:  ?Diagnosis Date  ? Blood transfusion without reported diagnosis   ? CAD (coronary artery disease)   ? GERD (gastroesophageal reflux disease)   ? Heart attack ([place])   ? Hyperlipidemia   ? Hypertension 03/07/12  ? ECHO-WNL     08/12/11 Lexiscan MyoviewNo significant ischemia demonstrated Low risk scan There is a moderate sized dense scar in the LCX territoy unchanged from the prior study.. Post- stress EF is 40%.  ? Thyroid disease   ? ? ?Past Surgical History:  ?Procedure Laterality Date  ? APPENDECTOMY    ? CERVICAL SPINE SURGERY    ? titanium plate in the back of neck  ? CORONARY ARTERY BYPASS GRAFT    ? HERNIA REPAIR    ? SMALL INTESTINE SURGERY    ? THYROID SURGERY    ? 1/2 thyroid removed on right side  ? ? ?Allergies  ?Allergen Reactions  ? Procardia [Nifedipine] Other (See Comments)  ?  Lowers bp ?  ? Phenergan [Promethazine Hcl] Nausea And Vomiting  ? ? ?Current Outpatient Medications  ?Medication Sig Dispense Refill  ? amLODipine (NORVASC) 5 MG tablet Take 1 tablet (5 mg total) by mouth daily. Please keep scheduled appointment for further refills 60 tablet 0  ? aspirin EC 81 MG tablet Take 1 tablet (81 mg total) by mouth daily. 90 tablet 3   ? esomeprazole (NEXIUM) 20 MG capsule Take 20 mg by mouth daily at 12 noon.    ? fish oil-omega-3 fatty acids 1000 MG capsule Take 1 g by mouth daily.     ? Garlic 5188 MG CAPS Take 1 capsule by mouth

## 2021-07-16 NOTE — Patient Instructions (Addendum)
Medication Instructions:  ?TAKE HYDROCHLOROTHIAZIDE IN THE AM NIT THE PM TO HELP WITH NIGHT-TIME URINATION. MAY INCREASE TO '25MG'$  AS NEEDED FOR SWELLING. ? ?*If you need a refill on your cardiac medications before your next appointment, please call your pharmacy* ? ?Lab Work:    Testing/Procedures:  ?CHECK WITH DR GREEN  NONE ? ?Follow-Up: ?Your next appointment:  12 month(s) In Person with Bradley Majestic, MD   I ? ?Please call our office 2 months in advance to schedule this appointment  ? ?At [facility], you and your health needs are our priority.  As part of our continuing mission to provide you with exceptional heart care, we have created designated Provider Care Teams.  These Care Teams include your primary Cardiologist (physician) and Advanced Practice Providers (APPs -  Physician Assistants and Nurse Practitioners) who all work together to provide you with the care you need, when you need it. ? ?We recommend signing up for the patient portal called "MyChart".  Sign up information is provided on this After Visit Summary.  MyChart is used to connect with patients for Virtual Visits (Telemedicine).  Patients are able to view lab/test results, encounter notes, upcoming appointments, etc.  Non-urgent messages can be sent to your provider as well.   ?To learn more about what you can do with MyChart, go to [email or web address].   ? ? ? ?Important Information About Sugar ? ? ? ? ? ? ? ?  ? ?

## 2021-07-17 ENCOUNTER — Encounter: Payer: Self-pay | Admitting: Cardiovascular Disease

## 2021-07-22 ENCOUNTER — Other Ambulatory Visit: Payer: Self-pay | Admitting: Cardiovascular Disease

## 2021-08-06 ENCOUNTER — Other Ambulatory Visit: Payer: Self-pay | Admitting: Orthopaedic Surgery

## 2021-08-06 DIAGNOSIS — G8929 Other chronic pain: Secondary | ICD-10-CM

## 2021-08-07 ENCOUNTER — Telehealth: Payer: Self-pay | Admitting: Pharmacist

## 2021-08-07 ENCOUNTER — Ambulatory Visit (INDEPENDENT_AMBULATORY_CARE_PROVIDER_SITE_OTHER): Payer: PPO

## 2021-08-07 VITALS — Ht 68.0 in | Wt 183.0 lb

## 2021-08-07 DIAGNOSIS — Z0000 Encounter for general adult medical examination without abnormal findings: Secondary | ICD-10-CM | POA: Diagnosis not present

## 2021-08-07 NOTE — Patient Instructions (Addendum)
?  Bradley Oconnor , ?Thank you for taking time to come for your Medicare Wellness Visit. I appreciate your ongoing commitment to your health goals. Please review the following plan we discussed and let me know if I can assist you in the future.  ? ?These are the goals we discussed: ? Goals   ? ?  Exercise 3x per week (30 min per time)   ?  Patient states that he wants to try to start back exercising.  ? ?  ?  Follow up with Primary Care Provider   ?  As needed.  ?  ?  Track and Manage My Blood Pressure-Hypertension   ?  Timeframe:  Long-Range Goal ?Priority:  High ?Start Date: 05/14/21                            ?Expected End Date: 11/11/21                     ? ?Follow Up Date 08/11/21  ?  ?- check blood pressure weekly ?- choose a place to take my blood pressure (home, clinic or office, retail store) ?- write blood pressure results in a log or diary  ?  ?Why is this important?   ?You won't feel high blood pressure, but it can still hurt your blood vessels.  ?High blood pressure can cause heart or kidney problems. It can also cause a stroke.  ?Making lifestyle changes like losing a little weight or eating less salt will help.  ?Checking your blood pressure at home and at different times of the day can help to control blood pressure.  ?If the doctor prescribes medicine remember to take it the way the doctor ordered.  ?Call the office if you cannot afford the medicine or if there are questions about it.   ?  ?Notes:  ?  ? ?  ?  ?This is a list of the screening recommended for you and due dates:  ?Health Maintenance  ?Topic Date Due  ? COVID-19 Vaccine (4 - Booster for Pfizer series) 08/23/2021*  ? Zoster (Shingles) Vaccine (1 of 2) 11/07/2021*  ? Flu Shot  10/28/2021  ? Tetanus Vaccine  06/08/2029  ? Pneumonia Vaccine  Completed  ? HPV Vaccine  Aged Out  ?*Topic was postponed. The date shown is not the original due date.  ?  ?

## 2021-08-07 NOTE — Progress Notes (Deleted)
? ? ?Chronic Care Management ?Pharmacy Assistant  ? ?Name: Bradley Oconnor  MRN: [id number] DOB: 1940/11/07 ? ? ?Reason for Encounter: Disease State - General Adherence Call  ?  ? ?Recent office visits:  ?None noted ? ?Recent consult visits:  ?None noted ? ?Hospital visits:  ?None in previous 6 months ? ? ?Medications: ?Outpatient Encounter Medications as of 08/07/2021  ?Medication Sig  ? amLODipine (NORVASC) 5 MG tablet TAKE 1 TABLET BY MOUTH ONCE DAILY . KEEPING UPCOMING APPOINTMENT REQUIRED FOR FUTURE REFILLS  ? aspirin EC 81 MG tablet Take 1 tablet (81 mg total) by mouth daily.  ? dextromethorphan-guaiFENesin (MUCINEX DM) 30-600 MG 12hr tablet Take 1 tablet by mouth 2 (two) times daily. (Patient not taking: Reported on 07/16/2021)  ? esomeprazole (NEXIUM) 20 MG capsule Take 20 mg by mouth daily at 12 noon.  ? fish oil-omega-3 fatty acids 1000 MG capsule Take 1 g by mouth daily.   ? fluticasone (FLONASE) 50 MCG/ACT nasal spray Place 2 sprays into both nostrils daily. (Patient not taking: Reported on 07/16/2021)  ? Garlic 0347 MG CAPS Take 1 capsule by mouth daily.   ? hydrochlorothiazide (MICROZIDE) 12.5 MG capsule Take 1 capsule (12.5 mg total) by mouth daily.  ? isosorbide mononitrate (IMDUR) 120 MG 24 hr tablet Take 1 tablet (120 mg total) by mouth daily. KEEP OV.  ? lansoprazole (PREVACID) 15 MG capsule Take 15 mg by mouth daily at 12 noon.  ? levothyroxine (SYNTHROID) 100 MCG tablet TAKE 1 TABLET BY MOUTH IN THE MORNING ***NEED OFFICE VISIT WITH DR. Claiborne Billings FOR FUTURE REFILLS***  ? metoprolol succinate (TOPROL-XL) 100 MG 24 hr tablet TAKE 1 TABLET BY MOUTH ONCE DAILY. PATIENT NEEDS TO SCHEDULE AN CARDIOLOGY APPOINTMENT IN ORDER TO RECEIVE FUTURE REFILLS ON MEDICATION  ? nitroGLYCERIN (NITROSTAT) 0.4 MG SL tablet DISSOLVE ONE TABLET UNDER THE TONGUE EVERY 5 MINUTES AS NEEDED FOR CHEST PAIN.  DO NOT EXCEED A TOTAL OF 3 DOSES IN 15 MINUTES  ? ramipril (ALTACE) 10 MG capsule Take 1 capsule by mouth once daily  ?  ranolazine (RANEXA) 1000 MG SR tablet TAKE 1  BY MOUTH TWICE DAILY  ? rosuvastatin (CRESTOR) 20 MG tablet TAKE 1 TABLET BY MOUTH ONCE DAILY IN THE EVENING  ? ?No facility-administered encounter medications on file as of 08/07/2021.  ? ? ?[place] for General Review Call ? ? ?Chart Review: ? ?Have there been any documented new, changed, or discontinued medications since last visit? No (If yes, include name, dose, frequency, date) ?Has there been any documented recent hospitalizations or ED visits since last visit with Clinical Pharmacist? No ?Brief Summary (including medication and/or Diagnosis changes): ? ? ?Adherence Review: ? ?Does the Clinical Pharmacist Assistant have access to adherence rates? Yes ?Adherence rates for STAR metric medications (List medication(s)/day supply/ last 2 fill dates). ?Adherence rates for medications indicated for disease state being reviewed (List medication(s)/day supply/ last 2 fill dates). ?Does the patient have >5 day gap between last estimated fill dates for any of the above medications or other medication gaps? No ?Reason for medication gaps. ? ? ?Disease State Questions: ? ?Able to connect with Patient? Yes ?Did patient have any problems with their health recently? No ?Note problems and Concerns: ?Have you had any admissions or emergency room visits or worsening of your condition(s) since last visit? No ?Details of ED visit, hospital visit and/or worsening condition(s): ?Have you had any visits with new specialists or providers since your last visit? No ?Explain: ?Have  you had any new health care problem(s) since your last visit? No ?New problem(s) reported: ?Have you run out of any of your medications since you last spoke with clinical pharmacist? No ?What caused you to run out of your medications? ?Are there any medications you are not taking as prescribed? Np ?What kept you from taking your medications as prescribed? ?Are you having any issues or side effects  with your medications? No ?Note of issues or side effects: ?Do you have any other health concerns or questions you want to discuss with your Clinical Pharmacist before your next visit? No ?Note additional concerns and questions from Patient. ?Are there any health concerns that you feel we can do a better job addressing? No ?Note Patient's response. ?Are you having any problems with any of the following since the last visit: (select all that apply) ? None ? Details: ?12. Any falls since last visit? No ? Details: ?13. Any increased or uncontrolled pain since last visit? No ? Details: ?14. Next visit Type: Telephone ?      Visit with: Health coach ?       Date: 08/07/21 ?       Time: 12:45pm ? ?15. Additional Details?  No  ? ? ?Care Gaps ?  ?AWV: overdue ?Colonoscopy: unknown (? Aged out) ?DM Eye Exam: N/A ?DM Foot Exam:  N/A ?Microalbumin: N/A ?HbgAIC: done 03/03/21 (6.2) ?DEXA: N/A ?Mammogram: N/A ?  ?  ? ?Star Rating Drugs: ?Rosuvastatin (CRESTOR) 20 MG tablet - last filled 06/30/21 90 days  ?Ramipril (ALTACE) 10 MG capsule - last filled 06/30/21 90 days  ? ? ? ?Future Appointments  ?Date Time Provider [place]  ?08/07/2021 12:45 PM [facility] HEALTH COACH [facility]  ?08/11/2021 11:40 AM Wendie Agreste, MD [facility]  ?05/20/2022  3:30 PM [facility] CCM PHARMACIST [facility]  ? ? ? ?Liza Showfety, CCMA ?Clinical Pharmacist Assistant  ?([phone] ? ?

## 2021-08-07 NOTE — Progress Notes (Signed)
Subjective:   Bradley Oconnor is a 81 y.o. male who presents for Medicare Annual/Subsequent preventive examination.  Review of Systems    No ROS.  Medicare Wellness Virtual Visit.  Visual/audio telehealth visit, UTA vital signs.   See social history for additional risk factors.   Cardiac Risk Factors include: advanced age (>22men, >54 women);male gender;hypertension     Objective:    Today's Vitals   08/07/21 1242  Weight: 183 lb (83 kg)  Height: 5\' 8"  (1.727 m)   Body mass index is 27.83 kg/m.     08/07/2021    1:01 PM 10/10/2019   11:14 AM 07/22/2019    9:15 PM 02/16/2017    1:44 PM 03/31/2013    4:10 PM  Advanced Directives  Does Patient Have a Medical Advance Directive? No No Yes No Patient has advance directive, copy not in chart  Type of Advance Directive   Healthcare Power of [employer] Power of Attorney  Does patient want to make changes to medical advance directive?    No - Patient declined No  Would patient like information on creating a medical advance directive? No - Patient declined No - Patient declined       Current Medications (verified): YES  Allergies (verified) Procardia [nifedipine] and Phenergan [promethazine hcl]   History: Past Medical History:  Diagnosis Date   Blood transfusion without reported diagnosis    CAD (coronary artery disease)    GERD (gastroesophageal reflux disease)    Heart attack (HCC)    Hyperlipidemia    Hypertension 03/07/12   ECHO-WNL     08/12/11 Lexiscan MyoviewNo significant ischemia demonstrated Low risk scan There is a moderate sized dense scar in the LCX territoy unchanged from the prior study.. Post- stress EF is 40%.   Thyroid disease    Past Surgical History:  Procedure Laterality Date   APPENDECTOMY     CERVICAL SPINE SURGERY     titanium plate in the back of neck   CORONARY ARTERY BYPASS GRAFT     HERNIA REPAIR     SMALL INTESTINE SURGERY     THYROID SURGERY     1/2 thyroid removed on right  side   Family History  Problem Relation Age of Onset   Heart disease Mother    Cancer Mother        breast, stomach   Heart disease Father    Hyperlipidemia Father    Heart disease Brother    Diabetes Brother    Hyperlipidemia Brother    Heart disease Paternal Grandfather    Healthy Brother    Social History   Socioeconomic History   Marital status: Married    Spouse name: Not on file   Number of children: Not on file   Years of education: 16   Highest education level: Some college, no degree  Occupational History   Occupation: Retired  Tobacco Use   Smoking status: Never   Smokeless tobacco: Never  Building services engineer Use: Never used  Substance and Sexual Activity   Alcohol use: No    Alcohol/week: 0.0 standard drinks   Drug use: No   Sexual activity: Never  Other Topics Concern   Not on file  Social History Narrative   Married.    Social Determinants of Health   Financial Resource Strain: Low Risk    Difficulty of Paying Living Expenses: Not hard at all  Food Insecurity: No Food Insecurity   Worried About Running Out of  Food in the Last Year: Never true   Ran Out of Food in the Last Year: Never true  Transportation Needs: No Transportation Needs   Lack of Transportation (Medical): No   Lack of Transportation (Non-Medical): No  Physical Activity: Not on file  Stress: No Stress Concern Present   Feeling of Stress : Not at all  Social Connections: Moderately Integrated   Frequency of Communication with Friends and Family: More than three times a week   Frequency of Social Gatherings with Friends and Family: More than three times a week   Attends Religious Services: 1 to 4 times per year   Active Member of [employer] or Organizations: No   Attends Engineer, structural: Never   Marital Status: Married    Tobacco Counseling Counseling given: Not Answered   Clinical Intake:  Pre-visit preparation completed: Yes        Diabetes: No  How often  do you need to have someone help you when you read instructions, pamphlets, or other written materials from your doctor or pharmacy?: 1 - Never  Interpreter Needed?: No    Activities of Daily Living    08/07/2021   12:45 PM  In your present state of health, do you have any difficulty performing the following activities:  Hearing? 0  Vision? 0  Difficulty concentrating or making decisions? 0  Walking or climbing stairs? 0  Dressing or bathing? 0  Doing errands, shopping? 0  Preparing Food and eating ? N  Using the Toilet? N  In the past six months, have you accidently leaked urine? N  Do you have problems with loss of bowel control? N  Managing your Medications? N  Managing your Finances? N  Housekeeping or managing your Housekeeping? N   Patient Care Team: [name], MD as PCP - General (Family Medicine) Lennette Bihari, MD as PCP - Cardiology (Cardiology) Lennette Bihari, MD as Consulting Physician (Cardiology) Erroll Luna, [facility] as Pharmacist (Pharmacist)  Indicate any recent Medical Services you may have received from other than [facility] providers in the past year (date may be approximate).     Assessment:   This is a routine wellness examination for Bradley Oconnor.  Virtual Visit via Telephone Note  I connected with  Bradley Oconnor on 08/07/21 at 12:45 PM EDT by telephone and verified that I am speaking with the correct person using two identifiers.  Persons participating in the virtual visit: patient/Nurse Health Advisor.   I discussed the limitations of performing an evaluation and management service by telehealth. We continued and completed visit with audio only. Some vital signs may be absent or patient reported.   Hearing/Vision screen Hearing Screening - Comments:: Patient is able to hear conversational tones without difficulty.  No issues reported.  Vision Screening - Comments:: Last OV 06/2021 Wears glasses  Dietary issues and exercise activities  discussed: Current Exercise Habits: Home exercise routine, Intensity: Mild Healthy diet Good water intake   Goals Addressed             This Visit's Progress    Follow up with Primary Care Provider       As needed.        Depression Screen    08/07/2021   12:56 PM 03/03/2021   11:07 AM 08/14/2019   10:59 AM 06/21/2019    4:19 PM 02/10/2018    1:57 PM 03/11/2017    4:15 PM 02/16/2017    1:58 PM  PHQ 2/9 Scores  PHQ -  2 Score 0 0 0 0 0 0 0    Fall Risk    08/07/2021   12:45 PM 03/03/2021   11:07 AM 08/14/2019   10:59 AM 06/21/2019    4:19 PM 02/10/2018    1:57 PM  Fall Risk   Falls in the past year? 0 1 1 0 1  Number falls in past yr: 0 0 1  0  Injury with Fall?  0 1    Risk for fall due to :  History of fall(s)     Follow up Falls evaluation completed Falls evaluation completed Falls evaluation completed Falls evaluation completed     FALL RISK PREVENTION PERTAINING TO THE HOME: Home free of loose throw rugs in walkways, pet beds, electrical cords, etc? Yes  Adequate lighting in your home to reduce risk of falls? Yes   ASSISTIVE DEVICES UTILIZED TO PREVENT FALLS: Life alert? No  Use of a cane, walker or w/c? No   TIMED UP AND GO: Was the test performed? No .   Cognitive Function:  Patient is alert and oriented x3.  Manages his own finances and medications without issues.  Enjoys reading.       08/07/2021   12:51 PM 02/16/2017    2:07 PM  6CIT Screen  What Year? 0 points 0 points  What month? 0 points 0 points  What time? 0 points 0 points  Count back from 20 0 points 0 points  Months in reverse 0 points 0 points  Repeat phrase 0 points 0 points  Total Score 0 points 0 points    Immunizations Immunization History  Administered Date(s) Administered   Fluad Quad(high Dose 65+) 03/17/2019, 01/17/2020, 02/27/2021   Influenza Split 12/09/2011   Influenza, High Dose Seasonal PF 02/10/2018   Influenza,inj,Quad PF,6+ Mos 02/01/2013, 02/01/2014,  01/17/2015, 12/17/2015, 12/31/2016   PFIZER(Purple Top)SARS-COV-2 Vaccination 06/13/2019, 07/04/2019, 03/03/2021   PNEUMOCOCCAL CONJUGATE-20 02/27/2021   Pneumococcal Polysaccharide-23 04/17/2014   Tdap 06/09/2019   Shingrix Completed?: No.    Education has been provided regarding the importance of this vaccine. Patient has been advised to call insurance company to determine out of pocket expense if they have not yet received this vaccine. Advised may also receive vaccine at local pharmacy or Health Dept. Verbalized acceptance and understanding.  Screening Tests Health Maintenance  Topic Date Due   COVID-19 Vaccine (4 - Booster for Pfizer series) 08/23/2021 (Originally 04/28/2021)   Zoster Vaccines- Shingrix (1 of 2) 11/07/2021 (Originally 08/10/1989)   INFLUENZA VACCINE  10/28/2021   TETANUS/TDAP  06/08/2029   Pneumonia Vaccine 51+ Years old  Completed   HPV VACCINES  Aged Out   Health Maintenance There are no preventive care reminders to display for this patient.  Lung Cancer Screening: (Low Dose CT Chest recommended if Age 43-80 years, 30 pack-year currently smoking OR have quit w/in 15years.) does not qualify.   Hepatitis C Screening: does not qualify.  Vision Screening: Recommended annual ophthalmology exams for early detection of glaucoma and other disorders of the eye.  Dental Screening: Recommended annual dental exams for proper oral hygiene. Next OV 08/13/21.  Community Resource Referral / Chronic Care Management: CRR required this visit?  No   CCM required this visit?  No      Plan:   Keep all routine maintenance appointments.   I have personally reviewed and noted the following in the patient's chart:   Medical and social history Use of alcohol, tobacco or illicit drugs  Current medications and supplements including  opioid prescriptions. Patient is not currently taking opioid prescriptions. Functional ability and status Nutritional status Physical  activity Advanced directives List of other physicians Hospitalizations, surgeries, and ER visits in previous 12 months Vitals Screenings to include cognitive, depression, and falls Referrals and appointments  In addition, I have reviewed and discussed with patient certain preventive protocols, quality metrics, and best practice recommendations. A written personalized care plan for preventive services as well as general preventive health recommendations were provided to patient.     Ashok Pall, LPN   1/61/0960

## 2021-08-08 NOTE — Progress Notes (Signed)
Chronic Care Management Pharmacy Assistant   Name: EVAAN TIDWELL  MRN: [id number] DOB: 1940-08-29   Reason for Encounter: Disease State - General Adherence Call     Recent office visits:  None noted  Recent consult visits:  None noted  Hospital visits:  None in previous 6 months   Medications: Outpatient Encounter Medications as of 08/07/2021  Medication Sig   amLODipine (NORVASC) 5 MG tablet TAKE 1 TABLET BY MOUTH ONCE DAILY . KEEPING UPCOMING APPOINTMENT REQUIRED FOR FUTURE REFILLS   aspirin EC 81 MG tablet Take 1 tablet (81 mg total) by mouth daily.   dextromethorphan-guaiFENesin (MUCINEX DM) 30-600 MG 12hr tablet Take 1 tablet by mouth 2 (two) times daily. (Patient not taking: Reported on 07/16/2021)   esomeprazole (NEXIUM) 20 MG capsule Take 20 mg by mouth daily at 12 noon.   fish oil-omega-3 fatty acids 1000 MG capsule Take 1 g by mouth daily.    fluticasone (FLONASE) 50 MCG/ACT nasal spray Place 2 sprays into both nostrils daily. (Patient not taking: Reported on 07/31/5463)   Garlic 6812 MG CAPS Take 1 capsule by mouth daily.    hydrochlorothiazide (MICROZIDE) 12.5 MG capsule Take 1 capsule (12.5 mg total) by mouth daily.   isosorbide mononitrate (IMDUR) 120 MG 24 hr tablet Take 1 tablet (120 mg total) by mouth daily. KEEP OV.   lansoprazole (PREVACID) 15 MG capsule Take 15 mg by mouth daily at 12 noon.   levothyroxine (SYNTHROID) 100 MCG tablet TAKE 1 TABLET BY MOUTH IN THE MORNING NEED OFFICE VISIT WITH DR. Claiborne Billings FOR FUTURE REFILLS   metoprolol succinate (TOPROL-XL) 100 MG 24 hr tablet TAKE 1 TABLET BY MOUTH ONCE DAILY. PATIENT NEEDS TO SCHEDULE AN CARDIOLOGY APPOINTMENT IN ORDER TO RECEIVE FUTURE REFILLS ON MEDICATION   nitroGLYCERIN (NITROSTAT) 0.4 MG SL tablet DISSOLVE ONE TABLET UNDER THE TONGUE EVERY 5 MINUTES AS NEEDED FOR CHEST PAIN.  DO NOT EXCEED A TOTAL OF 3 DOSES IN 15 MINUTES   ramipril (ALTACE) 10 MG capsule Take 1 capsule by mouth once daily    ranolazine (RANEXA) 1000 MG SR tablet TAKE 1  BY MOUTH TWICE DAILY   rosuvastatin (CRESTOR) 20 MG tablet TAKE 1 TABLET BY MOUTH ONCE DAILY IN THE EVENING   No facility-administered encounter medications on file as of 08/07/2021.    [place] for General Review Call   Chart Review:  Have there been any documented new, changed, or discontinued medications since last visit? No (If yes, include name, dose, frequency, date) Has there been any documented recent hospitalizations or ED visits since last visit with Clinical Pharmacist? No Brief Summary (including medication and/or Diagnosis changes):   Adherence Review:  Does the Clinical Pharmacist Assistant have access to adherence rates? Yes Adherence rates for STAR metric medications (List medication(s)/day supply/ last 2 fill dates). Adherence rates for medications indicated for disease state being reviewed (List medication(s)/day supply/ last 2 fill dates). Does the patient have >5 day gap between last estimated fill dates for any of the above medications or other medication gaps? No Reason for medication gaps.   Disease State Questions:  Able to connect with Patient? Yes Did patient have any problems with their health recently? No Note problems and Concerns: Have you had any admissions or emergency room visits or worsening of your condition(s) since last visit? No Details of ED visit, hospital visit and/or worsening condition(s): Have you had any visits with new specialists or providers since your last visit? No Explain:  Have you had any new health care problem(s) since your last visit? No New problem(s) reported: Have you run out of any of your medications since you last spoke with clinical pharmacist? No What caused you to run out of your medications? Are there any medications you are not taking as prescribed? Np What kept you from taking your medications as prescribed? Are you having any issues or side effects  with your medications? No Note of issues or side effects: Do you have any other health concerns or questions you want to discuss with your Clinical Pharmacist before your next visit? No Note additional concerns and questions from Patient. Are there any health concerns that you feel we can do a better job addressing? No Note Patient's response. Are you having any problems with any of the following since the last visit: (select all that apply)  None  Details: 12. Any falls since last visit? No  Details: 13. Any increased or uncontrolled pain since last visit? No  Details: 14. Next visit Type: Telephone       Visit with: Health coach        Date: 08/07/21        Time: 12:45pm  15. Additional Details?  No    Care Gaps   AWV: overdue Colonoscopy: unknown (? Aged out) DM Eye Exam: N/A DM Foot Exam:  N/A Microalbumin: N/A HbgAIC: done 03/03/21 (6.2) DEXA: N/A Mammogram: N/A      Star Rating Drugs: Rosuvastatin (CRESTOR) 20 MG tablet - last filled 06/30/21 90 days  Ramipril (ALTACE) 10 MG capsule - last filled 06/30/21 90 days     Future Appointments  Date Time Provider [place]  08/07/2021 12:45 PM [facility] HEALTH COACH [facility]  08/11/2021 11:40 AM Wendie Agreste, MD [facility]  05/20/2022  3:30 PM [facility] PHARMACIST [facility] [place], [place] Clinical Pharmacist Assistant

## 2021-08-11 ENCOUNTER — Ambulatory Visit (INDEPENDENT_AMBULATORY_CARE_PROVIDER_SITE_OTHER): Payer: PPO | Admitting: Family Medicine

## 2021-08-11 VITALS — BP 118/68 | HR 65 | Temp 98.0°F | Resp 16 | Ht 68.0 in | Wt 179.6 lb

## 2021-08-11 DIAGNOSIS — E876 Hypokalemia: Secondary | ICD-10-CM | POA: Diagnosis not present

## 2021-08-11 DIAGNOSIS — R2689 Other abnormalities of gait and mobility: Secondary | ICD-10-CM | POA: Diagnosis not present

## 2021-08-11 DIAGNOSIS — Z9181 History of falling: Secondary | ICD-10-CM | POA: Diagnosis not present

## 2021-08-11 LAB — CBC
HCT: 38.9 % — ABNORMAL LOW (ref 39.0–52.0)
Hemoglobin: 13.1 g/dL (ref 13.0–17.0)
MCHC: 33.8 g/dL (ref 30.0–36.0)
MCV: 92.6 fl (ref 78.0–100.0)
Platelets: 153 10*3/uL (ref 150.0–400.0)
RBC: 4.2 Mil/uL — ABNORMAL LOW (ref 4.22–5.81)
RDW: 13.8 % (ref 11.5–15.5)
WBC: 6.5 10*3/uL (ref 4.0–10.5)

## 2021-08-11 NOTE — Progress Notes (Signed)
? ?Subjective:  ?Patient ID: Bradley Oconnor, male    DOB: November 24, 1940  Age: 81 y.o. MRN: [id number] ? ?CC:  ?Chief Complaint  ?Patient presents with  ? Balance  ?  Pt notes his balance is about the same as previous visits no new concerns   ? ? ?HPI ?Dala Dock presents for  ? ?Balance difficulty: ?Last discussed in January.  Previously CBC, TSH were reassuring.  Component of instability of knees that may have been contributing and Ortho follow-up planned.  He was improving at his January visit.  Still some equilibrium issues.  1 fall at that time while trying to sit on commode.  We discussed neurology evaluation but that was declined at his last visit due to his wife's medical issues.  He also was likely suffering rebound COVID at his last visit. ?Appt with ortho at end of this month. Catch in R hip past 6 months, pain but not getting off balance with hip and prior knee pain/instability better. Not needing braces.  ?Feels like balance difficulty about the same.  ? Dragging feet with walking. Fell 3 weeks ago working on deck. Caught foot on post on deck. No head injury, abrasion of left lower leg improving. No other injuries. ?Prior fall few months ago - hit R elbow. Still some soreness, notes with flexion and arm elevation, not limiting activities.  ?Eats cereal for breakfast, banana. Lunch and dinner daily and regular fluids.  ?No alcohol.  ?Home BP's usually under 140/90. No low readings. ?Shaky this am after hot shower.  ?Agrees to neuro referral now.  ?No focal weakness, seizure, tremor or HA. Taking hctz daily now for ankle swelling. Usually as needed.  ?Lab Results  ?Component Value Date  ? NA 143 08/11/2021  ? K 3.6 08/11/2021  ? CL 103 08/11/2021  ? CO2 28 08/11/2021  ? ?Lab Results  ?Component Value Date  ? WBC 6.5 08/11/2021  ? HGB 13.1 08/11/2021  ? HCT 38.9 (L) 08/11/2021  ? MCV 92.6 08/11/2021  ? PLT 153.0 08/11/2021  ? ? ? ? ?Hypokalemia ? he did have some hypokalemia previously that had  improved.  Potassium rich foods have been discussed. ?Lab Results  ?Component Value Date  ? K 3.6 08/11/2021  ? ? ?History ?Patient Active Problem List  ? Diagnosis Date Noted  ? Low back pain 08/07/2020  ? Trigger finger of right thumb 04/17/2020  ? Carpal tunnel syndrome, bilateral 09/26/2019  ? Shoulder pain, bilateral 08/03/2019  ? Cervicalgia 03/28/2018  ? GERD (gastroesophageal reflux disease) 04/10/2013  ? HTN (hypertension) 04/10/2013  ? CAD (coronary artery disease) of artery bypass graft 10/04/2012  ? Hypothyroidism 10/04/2012  ? Hyperlipidemia with target LDL less than 70 10/04/2012  ? Kidney stones 01/27/2012  ? Gallstones-symptomatic 01/27/2012  ? Hx of appendectomy-history of ruptured requiring ileocecectomy (~1994) 01/27/2012  ? ?Past Medical History:  ?Diagnosis Date  ? Blood transfusion without reported diagnosis   ? CAD (coronary artery disease)   ? GERD (gastroesophageal reflux disease)   ? Heart attack ([place])   ? Hyperlipidemia   ? Hypertension 03/07/12  ? ECHO-WNL     08/12/11 Lexiscan MyoviewNo significant ischemia demonstrated Low risk scan There is a moderate sized dense scar in the LCX territoy unchanged from the prior study.. Post- stress EF is 40%.  ? Thyroid disease   ? ?Past Surgical History:  ?Procedure Laterality Date  ? APPENDECTOMY    ? CERVICAL SPINE SURGERY    ? titanium plate in the  back of neck  ? CORONARY ARTERY BYPASS GRAFT    ? HERNIA REPAIR    ? SMALL INTESTINE SURGERY    ? THYROID SURGERY    ? 1/2 thyroid removed on right side  ? ?Allergies  ?Allergen Reactions  ? Procardia [Nifedipine] Other (See Comments)  ?  Lowers bp ?  ? Phenergan [Promethazine Hcl] Nausea And Vomiting  ? ?Prior to Admission medications   ?Medication Sig Start Date End Date Taking? Authorizing Provider  ?amLODipine (NORVASC) 5 MG tablet TAKE 1 TABLET BY MOUTH ONCE DAILY . KEEPING UPCOMING APPOINTMENT REQUIRED FOR FUTURE REFILLS 07/23/21  Yes Troy Sine, MD  ?aspirin EC 81 MG tablet Take 1 tablet (81  mg total) by mouth daily. 04/06/16  Yes Troy Sine, MD  ?esomeprazole (NEXIUM) 20 MG capsule Take 20 mg by mouth daily at 12 noon.   Yes [provider]  ?fish oil-omega-3 fatty acids 1000 MG capsule Take 1 g by mouth daily.    Yes [provider]  ?Garlic 2878 MG CAPS Take 1 capsule by mouth daily.    Yes [provider]  ?hydrochlorothiazide (MICROZIDE) 12.5 MG capsule Take 1 capsule (12.5 mg total) by mouth daily. 03/17/21  Yes Troy Sine, MD  ?isosorbide mononitrate (IMDUR) 120 MG 24 hr tablet Take 1 tablet (120 mg total) by mouth daily. KEEP OV. 05/13/21  Yes Troy Sine, MD  ?lansoprazole (PREVACID) 15 MG capsule Take 15 mg by mouth daily at 12 noon.   Yes [provider]  ?levothyroxine (SYNTHROID) 100 MCG tablet TAKE 1 TABLET BY MOUTH IN THE MORNING NEED OFFICE VISIT WITH DR. Claiborne Billings FOR FUTURE REFILLS 06/17/21  Yes Troy Sine, MD  ?metoprolol succinate (TOPROL-XL) 100 MG 24 hr tablet TAKE 1 TABLET BY MOUTH ONCE DAILY. PATIENT NEEDS TO SCHEDULE AN CARDIOLOGY APPOINTMENT IN ORDER TO RECEIVE FUTURE REFILLS ON MEDICATION 05/16/21  Yes Troy Sine, MD  ?nitroGLYCERIN (NITROSTAT) 0.4 MG SL tablet DISSOLVE ONE TABLET UNDER THE TONGUE EVERY 5 MINUTES AS NEEDED FOR CHEST PAIN.  DO NOT EXCEED A TOTAL OF 3 DOSES IN 15 MINUTES 06/26/21  Yes Troy Sine, MD  ?ramipril (ALTACE) 10 MG capsule Take 1 capsule by mouth once daily 03/25/21  Yes Troy Sine, MD  ?ranolazine (RANEXA) 1000 MG SR tablet TAKE 1  BY MOUTH TWICE DAILY 03/17/21  Yes Troy Sine, MD  ?rosuvastatin (CRESTOR) 20 MG tablet TAKE 1 TABLET BY MOUTH ONCE DAILY IN THE EVENING 04/07/21  Yes Troy Sine, MD  ?dextromethorphan-guaiFENesin [facility] DM) 30-600 MG 12hr tablet Take 1 tablet by mouth 2 (two) times daily. ?Patient not taking: Reported on 07/16/2021 04/11/21   Maximiano Coss, NP  ?fluticasone [facility]) 50 MCG/ACT nasal spray Place 2 sprays into both nostrils daily. ?Patient not taking:  Reported on 07/16/2021 04/11/21   Maximiano Coss, NP  ? ?Social History  ? ?Socioeconomic History  ? Marital status: Married  ?  Spouse name: Not on file  ? Number of children: Not on file  ? Years of education: 30  ? Highest education level: Some college, no degree  ?Occupational History  ? Occupation: Retired  ?Tobacco Use  ? Smoking status: Never  ? Smokeless tobacco: Never  ?Vaping Use  ? Vaping Use: Never used  ?Substance and Sexual Activity  ? Alcohol use: No  ?  Alcohol/week: 0.0 standard drinks  ? Drug use: No  ? Sexual activity: Never  ?Other Topics Concern  ? Not on file  ?  Social History Narrative  ? Married.   ? ?Social Determinants of Health  ? ?Financial Resource Strain: Low Risk   ? Difficulty of Paying Living Expenses: Not hard at all  ?Food Insecurity: No Food Insecurity  ? Worried About Charity fundraiser in the Last Year: Never true  ? Ran Out of Food in the Last Year: Never true  ?Transportation Needs: No Transportation Needs  ? Lack of Transportation (Medical): No  ? Lack of Transportation (Non-Medical): No  ?Physical Activity: Not on file  ?Stress: No Stress Concern Present  ? Feeling of Stress : Not at all  ?Social Connections: Moderately Integrated  ? Frequency of Communication with Friends and Family: More than three times a week  ? Frequency of Social Gatherings with Friends and Family: More than three times a week  ? Attends Religious Services: 1 to 4 times per year  ? Active Member of Clubs or Organizations: No  ? Attends Archivist Meetings: Never  ? Marital Status: Married  ?Intimate Partner Violence: Not At Risk  ? Fear of Current or Ex-Partner: No  ? Emotionally Abused: No  ? Physically Abused: No  ? Sexually Abused: No  ? ? ?Review of Systems ? ? ?Objective:  ? ?Vitals:  ? 08/11/21 1138  ?BP: 118/68  ?Pulse: 65  ?Resp: 16  ?Temp: 98 ?F (36.7 ?C)  ?TempSrc: Temporal  ?SpO2: 97%  ?Weight: 179 lb 9.6 oz (81.5 kg)  ?Height: '5\' 8"'$  (1.727 m)  ? ? ? ?Physical Exam ?Vitals reviewed.   ?Constitutional:   ?   Appearance: He is well-developed.  ?HENT:  ?   Head: Normocephalic and atraumatic.  ?Neck:  ?   Vascular: No carotid bruit or JVD.  ?Cardiovascular:  ?   Rate and Rhythm: Normal rate and

## 2021-08-12 ENCOUNTER — Encounter: Payer: Self-pay | Admitting: Family Medicine

## 2021-08-12 ENCOUNTER — Encounter: Payer: Self-pay | Admitting: Neurology

## 2021-08-12 LAB — BASIC METABOLIC PANEL
BUN: 23 mg/dL (ref 6–23)
CO2: 28 mEq/L (ref 19–32)
Calcium: 8.9 mg/dL (ref 8.4–10.5)
Chloride: 103 mEq/L (ref 96–112)
Creatinine, Ser: 1.13 mg/dL (ref 0.40–1.50)
GFR: 60.75 mL/min (ref >60.00)
Glucose, Bld: 125 mg/dL — ABNORMAL HIGH (ref 70–99)
Potassium: 3.6 mEq/L (ref 3.5–5.1)
Sodium: 143 mEq/L (ref 135–145)

## 2021-08-13 ENCOUNTER — Telehealth: Payer: Self-pay | Admitting: Cardiovascular Disease

## 2021-08-13 NOTE — Telephone Encounter (Signed)
? ?  Pre-operative Risk Assessment  ?  ?Patient Name: Bradley Oconnor  ?DOB: [birth year] ?MRN: [id number]  ? ? ? ?Request for Surgical Clearance   ? ?Procedure:  Dental Extraction - Amount of Teeth to be Pulled:  1 ? ?Date of Surgery:  Clearance TBD                              ?   ?Surgeon:  Dr. Trenton Gammon ?Surgeon's Group or Practice Name:  Dr. Trenton Gammon ?Phone number:  [phone] ?Fax number:  [phone] ?  ?Type of Clearance Requested:   ?- Medical  ?  ?Type of Anesthesia:  Local  ?  ?Additional requests/questions:  Please fax a copy of medical clearance to the surgeon's office. ? ?Signed, ?Durel Salts   ?08/13/2021, 2:36 PM   ?

## 2021-08-14 NOTE — Telephone Encounter (Signed)
   Patient Name: Bradley Oconnor  DOB: 01-08-1941 MRN: [id number]  Primary Cardiologist: Shelva Majestic, MD  Chart reviewed as part of pre-operative protocol coverage.   Simple dental extractions (i.e. 1-2 teeth) are considered low risk procedures per guidelines and generally do not require any specific cardiac clearance. It is also generally accepted that for simple extractions and dental cleanings, there is no need to interrupt blood thinner therapy.  SBE prophylaxis is not required for the patient from a cardiac standpoint based on our records.  I will route this recommendation to the requesting party via Epic fax function and remove from pre-op pool.  Please call with questions.  Charlie Pitter, PA-C 08/14/2021, 9:38 AM

## 2021-08-19 ENCOUNTER — Other Ambulatory Visit: Payer: Self-pay | Admitting: Cardiovascular Disease

## 2021-08-20 ENCOUNTER — Ambulatory Visit (INDEPENDENT_AMBULATORY_CARE_PROVIDER_SITE_OTHER): Payer: PPO

## 2021-08-20 ENCOUNTER — Ambulatory Visit: Payer: PPO | Admitting: Orthopaedic Surgery

## 2021-08-20 ENCOUNTER — Encounter: Payer: Self-pay | Admitting: Orthopaedic Surgery

## 2021-08-20 DIAGNOSIS — M5441 Lumbago with sciatica, right side: Secondary | ICD-10-CM | POA: Diagnosis not present

## 2021-08-20 DIAGNOSIS — M5442 Lumbago with sciatica, left side: Secondary | ICD-10-CM

## 2021-08-20 DIAGNOSIS — M25512 Pain in left shoulder: Secondary | ICD-10-CM | POA: Diagnosis not present

## 2021-08-20 DIAGNOSIS — M25551 Pain in right hip: Secondary | ICD-10-CM

## 2021-08-20 DIAGNOSIS — M25511 Pain in right shoulder: Secondary | ICD-10-CM

## 2021-08-20 DIAGNOSIS — G8929 Other chronic pain: Secondary | ICD-10-CM

## 2021-08-20 MED ORDER — METHYLPREDNISOLONE ACETATE 40 MG/ML IJ SUSP
60.0000 mg | INTRAMUSCULAR | Status: AC | PRN
  Administered 2021-08-20: 60 mg via INTRA_ARTICULAR

## 2021-08-20 MED ORDER — LIDOCAINE HCL 2 % IJ SOLN
1.0000 mg | INTRAMUSCULAR | Status: AC | PRN
  Administered 2021-08-20: 1 mg

## 2021-08-20 MED ORDER — METHYLPREDNISOLONE ACETATE 40 MG/ML IJ SUSP
10.0000 mg | INTRAMUSCULAR | Status: AC | PRN
  Administered 2021-08-20: 10 mg via INTRAMUSCULAR

## 2021-08-20 MED ORDER — BUPIVACAINE HCL 0.25 % IJ SOLN
2.0000 mL | INTRAMUSCULAR | Status: AC | PRN
  Administered 2021-08-20: 2 mL via INTRA_ARTICULAR

## 2021-08-20 MED ORDER — LIDOCAINE HCL 2 % IJ SOLN
2.0000 mL | INTRAMUSCULAR | Status: AC | PRN
  Administered 2021-08-20: 2 mL

## 2021-08-20 NOTE — Progress Notes (Signed)
Office Visit Note   Patient: Bradley Oconnor           Date of Birth: 03/23/1941           MRN: [id number] Visit Date: 08/20/2021              Requested by: Wendie Agreste, MD [address] [place],  [place] [address] PCP: Wendie Agreste, MD   Assessment & Plan: Visit Diagnoses:  1. Pain in right hip   2. Chronic bilateral low back pain with bilateral sciatica   3. Chronic pain of both shoulders     Plan: Mr. Elgersma came to the office today today complaining of a localized area of tenderness in the right parasacral region.  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  He is really not having much pain in his lower extremities or groin.  He does have some arthritic changes in both of his knees which can be a nuisance.  I have injected the area of trigger point tenderness in the right parasacral region and we will just monitor his response.  In addition he has had recurrent pain in his right shoulder.  He had an MRI scan in 2021 demonstrating moderate tendinosis of the supraspinatus tendon with a small partial-thickness bursal surface tear.  There was also mild tendinosis of the infraspinatus tendon and mild in tendinosis of the intra-articular portion of the long head of the biceps.  He did well with a subacromial cortisone injection until just recently.  No injury or trauma.  He does have impingement but no weakness.  I will reinject his shoulder with cortisone subacromial region and monitor his response.  Consider MRI scan with the possibility of rotator cuff tear if still has trouble.  Would like to see him back within a month or so if he still having issues with either his back, right shoulder or knees  Follow-Up Instructions: Return if symptoms worsen or fail to improve.   Orders:  Orders Placed This Encounter  Procedures   XR Lumbar Spine 2-3 Views   XR Pelvis 1-2 Views   No orders of the defined  types were placed in this encounter.     Procedures: Large Joint Inj: R subacromial bursa on 08/20/2021 3:02 PM Indications: pain and diagnostic evaluation Details: 25 G 1.5 in needle, anterolateral approach  Arthrogram: No  Medications: 2 mL lidocaine 2 %; 60 mg methylPREDNISolone acetate 40 MG/ML; 2 mL bupivacaine 0.25 % Consent was given by the patient. Immediately prior to procedure a time out was called to verify the correct patient, procedure, equipment, support staff and site/side marked as required. Patient was prepped and draped in the usual sterile fashion.    Trigger Point Inj  Date/Time: 08/20/2021 3:03 PM Performed by: Garald Balding, MD Authorized by: Garald Balding, MD   Consent Given by:  Patient Site marked: the procedure site was marked   Indications:  Pain Total # of Trigger Points:  1 Location: back   Needle Size:  25 G Approach:  Dorsal Medications #1:  1 mg lidocaine 2 %; 10 mg methylPREDNISolone acetate 40 MG/ML   Clinical Data: No additional findings.   Subjective: Chief Complaint  Patient presents with   Right Hip - Pain   Right Shoulder - Pain  Patient presents today for right hip and right shoulder pain. He has been having pain in his right buttock area  for 3 weeks. He said that he has not slept the last two nights. No groin pain. No pain down either leg. He has seen Dr.Diasia Henken for his lower back in the past. He also wants to be seen for his right shoulder. He has recurrent right shoulder pain. He received a cortisone injection back in 2021.  HPI  Review of Systems   Objective: Vital Signs: There were no vitals taken for this visit.  Physical Exam Constitutional:      Appearance: He is well-developed.  Eyes:     Pupils: Pupils are equal, round, and reactive to light.  Pulmonary:     Effort: Pulmonary effort is normal.  Skin:    General: Skin is warm and dry.  Neurological:     Mental Status: He is alert and oriented to  person, place, and time.  Psychiatric:        Behavior: Behavior normal.    Ortho Exam awake alert and oriented x3.  Comfortable sitting.  Straight leg raise negative.  Does have a little crepitation with patella motion both knees.  No instability or effusion.  Some medial and lateral joint pain bilaterally.  Motor exam intact.  Walks without a limp.  Does have an area of tenderness about the size of a half dollar in the right parasacral region with tenderness.  No percussible tenderness of the lumbar spine.  Right shoulder with full overhead motion.  Does have positive impingement.  No grating or crepitation.  Appears to have good strength.  There is an area of tenderness in the anterior lateral subacromial region.  Minimal empty can testing.  Speed sign negative.  Biceps intact  Specialty Comments:  No specialty comments available.  Imaging: XR Lumbar Spine 2-3 Views  Result Date: 08/20/2021 AP lateral lumbar spine demonstrates a very minimal degenerative scoliosis to the left and 5 degrees or less.  Apex is about L2-3.  Facet sclerosis at L4-5 and L5-S1.  Diffuse calcification of the abdominal aorta with widest diameter about 29 mm.  No obvious aneurysmal dilatation.  Disc spaces are well-maintained.  No evidence of compression fracture films are consistent with osteoarthritis without any acute changes  XR Pelvis 1-2 Views  Result Date: 08/20/2021 AP pelvis demonstrate some mild degenerative changes in the right hip and minimal on the left.  The joint space appears to be well-maintained.  There is cystic change in the lateral acetabulum on the right.  No ectopic calcification or bony changes    PMFS History: Patient Active Problem List   Diagnosis Date Noted   Low back pain 08/07/2020   Trigger finger of right thumb 04/17/2020   Carpal tunnel syndrome, bilateral 09/26/2019   Shoulder pain, bilateral 08/03/2019   Cervicalgia 03/28/2018   GERD (gastroesophageal reflux disease)  04/10/2013   HTN (hypertension) 04/10/2013   CAD (coronary artery disease) of artery bypass graft 10/04/2012   Hypothyroidism 10/04/2012   Hyperlipidemia with target LDL less than 70 10/04/2012   Kidney stones 01/27/2012   Gallstones-symptomatic 01/27/2012   Hx of appendectomy-history of ruptured requiring ileocecectomy (~1994) 01/27/2012   Past Medical History:  Diagnosis Date   Blood transfusion without reported diagnosis    CAD (coronary artery disease)    GERD (gastroesophageal reflux disease)    Heart attack ([facility])    Hyperlipidemia    Hypertension 03/07/12   ECHO-WNL     08/12/11 Lexiscan MyoviewNo significant ischemia demonstrated Low risk scan There is a moderate sized dense scar in the LCX territoy unchanged from  the prior study.. Post- stress EF is 40%.   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

## 2021-09-17 ENCOUNTER — Other Ambulatory Visit: Payer: Self-pay | Admitting: Cardiovascular Disease

## 2021-09-26 ENCOUNTER — Other Ambulatory Visit: Payer: Self-pay

## 2021-09-26 MED ORDER — RANOLAZINE ER 1000 MG PO TB12: ORAL_TABLET | ORAL | 1 refills | Status: DC

## 2021-09-30 ENCOUNTER — Other Ambulatory Visit: Payer: Self-pay | Admitting: Cardiovascular Disease

## 2021-11-12 ENCOUNTER — Ambulatory Visit: Payer: PPO | Admitting: Family Medicine

## 2021-11-17 ENCOUNTER — Other Ambulatory Visit: Payer: Self-pay | Admitting: Cardiovascular Disease

## 2021-11-21 ENCOUNTER — Other Ambulatory Visit: Payer: Self-pay | Admitting: Cardiovascular Disease

## 2021-12-30 ENCOUNTER — Ambulatory Visit: Payer: PPO | Admitting: Neurology

## 2022-03-24 ENCOUNTER — Other Ambulatory Visit: Payer: Self-pay | Admitting: Cardiovascular Disease

## 2022-04-23 ENCOUNTER — Other Ambulatory Visit: Payer: Self-pay | Admitting: Cardiovascular Disease

## 2022-05-20 ENCOUNTER — Ambulatory Visit: Payer: PPO | Admitting: Pharmacist

## 2022-05-20 NOTE — Progress Notes (Signed)
Care Management & Coordination Services Pharmacy Note  05/20/2022 Name:  Bradley Oconnor MRN:  [id number] DOB:  1940-06-20  Summary: PharmD FU visit.  Patient is doing very well.  Taking care of his wife right now while she is battling covid.  All medications up to date and no concerns at this time.  Recommendations/Changes made from today's visit: No changes  Follow up plan: FU 6 months    Subjective: Bradley Oconnor is an 82 y.o. year old male who is a primary patient of Carlota Raspberry, Ranell Patrick, MD.  The care coordination team was consulted for assistance with disease management and care coordination needs.    Engaged with patient by telephone for follow up visit.  Recent office visits:  None noted   Recent consult visits:  None noted   Hospital visits:  None in previous 6 months   Objective:  Lab Results  Component Value Date   CREATININE 1.13 08/11/2021   BUN 23 08/11/2021   GFR 60.75 08/11/2021   GFRNONAA 67 06/18/2017   GFRAA 78 06/18/2017   NA 143 08/11/2021   K 3.6 08/11/2021   CALCIUM 8.9 08/11/2021   CO2 28 08/11/2021   GLUCOSE 125 (H) 08/11/2021    Lab Results  Component Value Date/Time   HGBA1C 6.2 03/03/2021 12:14 PM   HGBA1C 6.1 (A) 08/14/2019 12:00 PM   GFR 60.75 08/11/2021 12:24 PM   GFR 51.00 (L) 04/01/2021 01:10 PM    Last diabetic Eye exam: No results found for: "HMDIABEYEEXA"  Last diabetic Foot exam: No results found for: "HMDIABFOOTEX"   Lab Results  Component Value Date   CHOL 132 03/03/2021   HDL 42.50 03/03/2021   LDLCALC 65 03/03/2021   TRIG 124.0 03/03/2021   CHOLHDL 3 03/03/2021       Latest Ref Rng & Units 03/03/2021   12:14 PM 06/18/2017   12:33 PM 04/29/2016   12:13 PM  Hepatic Function  Total Protein 6.0 - 8.3 g/dL 6.9  7.4  6.8   Albumin 3.5 - 5.2 g/dL 4.0  4.1  3.5   AST 0 - 37 U/L 14  21  24   $ ALT 0 - 53 U/L 12  22  34   Alk Phosphatase 39 - 117 U/L 46  54  53   Total Bilirubin 0.2 - 1.2 mg/dL 1.2  0.9  0.9      Lab Results  Component Value Date/Time   TSH 0.65 03/03/2021 12:14 PM   TSH 0.895 06/18/2017 12:33 PM       Latest Ref Rng & Units 08/11/2021   12:24 PM 03/03/2021   12:14 PM 06/18/2017   12:33 PM  CBC  WBC 4.0 - 10.5 K/uL 6.5  6.5  7.3   Hemoglobin 13.0 - 17.0 g/dL 13.1  13.0  13.6   Hematocrit 39.0 - 52.0 % 38.9  38.9  41.7   Platelets 150.0 - 400.0 K/uL 153.0  159.0  156     No results found for: "VD25OH", "VITAMINB12"  Clinical ASCVD: Yes  The ASCVD Risk score (Arnett DK, et al., 2019) failed to calculate for the following reasons:   The 2019 ASCVD risk score is only valid for ages 66 to 53   The patient has a prior MI or stroke diagnosis       08/11/2021   11:41 AM 08/07/2021   12:56 PM 03/03/2021   11:07 AM  Depression screen PHQ 2/9  Decreased Interest 1 0 0  Down, Depressed, Hopeless 0  0 0  PHQ - 2 Score 1 0 0  Altered sleeping 0    Tired, decreased energy 1    Change in appetite 1    Feeling bad or failure about yourself  0    Trouble concentrating 0    Moving slowly or fidgety/restless 0    Suicidal thoughts 0    PHQ-9 Score 3       Social History   Tobacco Use  Smoking Status Never  Smokeless Tobacco Never   BP Readings from Last 3 Encounters:  08/11/21 118/68  07/16/21 116/68  04/21/21 110/62   Pulse Readings from Last 3 Encounters:  08/11/21 65  07/16/21 (!) 51  04/21/21 64   Wt Readings from Last 3 Encounters:  08/11/21 179 lb 9.6 oz (81.5 kg)  08/07/21 183 lb (83 kg)  07/16/21 183 lb (83 kg)   BMI Readings from Last 3 Encounters:  08/11/21 27.31 kg/m  08/07/21 27.83 kg/m  07/16/21 27.83 kg/m    Allergies  Allergen Reactions   Procardia [Nifedipine] Other (See Comments)    Lowers bp    Phenergan [Promethazine Hcl] Nausea And Vomiting       SDOH:  (Social Determinants of Health) assessments and interventions performed: No  Financial Resource Strain: Low Risk  (08/07/2021)   Overall Financial Resource Strain (CARDIA)     Difficulty of Paying Living Expenses: Not hard at all   Food Insecurity: No Food Insecurity (08/07/2021)   Hunger Vital Sign    Worried About Running Out of Food in the Last Year: Never true    Ran Out of Food in the Last Year: Never true    Medication Assistance: None required.  Patient affirms current coverage meets needs.  Medication Access: Within the past 30 days, how often has patient missed a dose of medication? 0 Is a pillbox or other method used to improve adherence? Yes  Factors that may affect medication adherence? no barriers identified Are meds synced by current pharmacy? No  Are meds delivered by current pharmacy? No  Does patient experience delays in picking up medications due to transportation concerns? No   [facility] Services Reviewed: Is patient disadvantaged to use [facility]?: Yes  Current Rx insurance plan: H[employer] Name and location of Current pharmacy:  [place], [place] [address] Phone: [phone] Fax: [phone]  [facility] services reviewed with patient today?: Yes  Patient requests to transfer care to [facility]?: No  Reason patient declined to change pharmacies: Disadvantaged due to insurance/mail order  Compliance/Adherence/Medication fill history: Care Gaps: None  Star-Rating Drugs: Ramipril 57m 02/27/22 90ds Rosuvastatin 250m01/05/24 90ds  Assessment/Plan                   Hypertension (BP goal <130/80) 05/20/22 -Controlled -Current treatment: Ramipril 1061maily Appropriate, Effective, Safe, Accessible Amlodipine 5mg11mily Appropriate, Effective, Safe, Accessible HCTZ 12.5mg 51mly Appropriate, Effective, Safe, Accessible Isosorbide 120mg 54m daily Appropriate, Effective, Safe, Accessible Metoprolol XL 100mg  37mopriate, Effective, Safe, Accessible -Medications previously tried: Procardia (N/V),  -Current home  readings: BP well controlled, he has not checked lately since his wife is battling COVID and he is taking care of her -Current exercise habits: not as much since COVID -Denies hypotensive/hypertensive symptoms -Educated on BP goals and benefits of medications for prevention of heart attack, stroke and kidney damage; Exercise goal of 150 minutes per week; Proper BP monitoring technique; Symptoms of hypotension  and importance of maintaining adequate hydration; -Continues to see cardiology regularly.  Medications have not changed at home.  He does not have any headaches or dizziness at home. Continue current meds - contact us if BP consistently above goal.  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  Working on limiting his carbs/sugars -Current exercise habits: has decreased some since COVID diagnosis -Educated on Cholesterol goals;  Benefits of statin for ASCVD risk reduction; Importance of limiting foods high in cholesterol; -Tolerating medication.  He feels well  and has no concerns. Continue same medications.  Hypothyroidism (Goal: Maintain TSH) -Controlled -Current treatment  Levothyroxine 1075m daily Appropriate, Effective, Safe, Accessible -Medications previously tried: none noted -Takes correctly first thing in AM on empty stomach, most recent TSH is WNL  -Recommended to continue current medication No changes at this time  GERD (Goal: Prevent acid reflux) -Controlled -Current treatment  Nexium 2027maily Query Appropriate, -Medications previously tried: Prevacid  -Continues to take daily, has taken long term.  He does have symptoms when he tries to stop medication. -Recommended to continue current medication Will continue to work on triggers - would love to  try to taper off in the future.       ChrBeverly MilchharmD Clinical Pharmacist  [phone]

## 2022-05-29 ENCOUNTER — Other Ambulatory Visit: Payer: Self-pay | Admitting: *Deleted

## 2022-05-29 MED ORDER — AMLODIPINE BESYLATE 5 MG PO TABS: 5.0000 mg | ORAL_TABLET | Freq: Every day | ORAL | 0 refills | Status: DC

## 2022-06-03 ENCOUNTER — Other Ambulatory Visit: Payer: Self-pay | Admitting: Cardiovascular Disease

## 2022-06-29 ENCOUNTER — Other Ambulatory Visit: Payer: Self-pay | Admitting: Cardiovascular Disease

## 2022-07-14 ENCOUNTER — Ambulatory Visit: Payer: PPO | Admitting: Orthopaedic Surgery

## 2022-07-14 ENCOUNTER — Other Ambulatory Visit: Payer: Self-pay | Admitting: Cardiovascular Disease

## 2022-07-14 DIAGNOSIS — M25551 Pain in right hip: Secondary | ICD-10-CM | POA: Diagnosis not present

## 2022-07-14 DIAGNOSIS — M25552 Pain in left hip: Secondary | ICD-10-CM

## 2022-07-14 MED ORDER — METHYLPREDNISOLONE ACETATE 40 MG/ML IJ SUSP
40.0000 mg | INTRAMUSCULAR | Status: AC | PRN
  Administered 2022-07-14: 40 mg via INTRA_ARTICULAR

## 2022-07-14 MED ORDER — LIDOCAINE HCL 1 % IJ SOLN
5.0000 mL | INTRAMUSCULAR | Status: AC | PRN
  Administered 2022-07-14: 5 mL

## 2022-07-14 NOTE — Progress Notes (Signed)
Office Visit Note   Patient: Bradley Oconnor           Date of Birth: January 31, 1941           MRN: [id number] Visit Date: 07/14/2022              Requested by: [name], MD [address] [place],  [place] [address] PCP: [name], MD   Assessment & Plan: Visit Diagnoses:  1. Bilateral hip pain     Plan: Impression is 82 year old gentleman with hip pain recurrence of abductor tendinopathy.  Patient would like to try the injections again.  He tolerated these well.  Follow-up as needed.  Follow-Up Instructions: No follow-ups on file.   Orders:  No orders of the defined types were placed in this encounter.  No orders of the defined types were placed in this encounter.     Procedures: Large Joint Inj: bilateral greater trochanter on 07/14/2022 1:09 PM Indications: pain Details: 22 G needle  Arthrogram: No  Medications (Right): 40 mg methylPREDNISolone acetate 40 MG/ML; 5 mL lidocaine 1 % Medications (Left): 40 mg methylPREDNISolone acetate 40 MG/ML; 5 mL lidocaine 1 % Patient was prepped and draped in the usual sterile fashion.       Clinical Data: No additional findings.   Subjective: Chief Complaint  Patient presents with   Left Hip - Pain   Right Hip - Pain    HPI  Bradley Oconnor is a very pleasant 82 year old gentleman previous patient Dr. Cleophas Dunker here for evaluation of bilateral hip pain.  Saw Dr. Cleophas Dunker few years ago for this and underwent bilateral trochanteric injections which helped significantly until recently.  Denies any recent injuries or changes to his symptoms other than worsening.  Review of Systems  Constitutional: Negative.   HENT: Negative.    Eyes: Negative.   Respiratory: Negative.    Cardiovascular: Negative.   Gastrointestinal: Negative.   Endocrine: Negative.   Genitourinary: Negative.   Skin: Negative.   Allergic/Immunologic: Negative.   Neurological: Negative.   Hematological: Negative.    Psychiatric/Behavioral: Negative.    All other systems reviewed and are negative.    Objective: Vital Signs: There were no vitals taken for this visit.  Physical Exam Vitals and nursing note reviewed.  Constitutional:      Appearance: He is well-developed.  Pulmonary:     Effort: Pulmonary effort is normal.  Abdominal:     Palpations: Abdomen is soft.  Skin:    General: Skin is warm.  Neurological:     Mental Status: He is alert and oriented to person, place, and time.  Psychiatric:        Behavior: Behavior normal.        Thought Content: Thought content normal.        Judgment: Judgment normal.     Ortho Exam  Exam nation of hip showed no pain in the groin or upper thigh.  He has point tenderness to the insertion of the abductors.  Pain with Ober sign.  Specialty Comments:  No specialty comments available.  Imaging: No results found.   PMFS History: Patient Active Problem List   Diagnosis Date Noted   Low back pain 08/07/2020   Trigger finger of right thumb 04/17/2020   Carpal tunnel syndrome, bilateral 09/26/2019   Shoulder pain, bilateral 08/03/2019   Cervicalgia 03/28/2018   GERD (gastroesophageal reflux disease) 04/10/2013   HTN (hypertension) 04/10/2013   CAD (coronary artery disease) of artery bypass graft  10/04/2012   Hypothyroidism 10/04/2012   Hyperlipidemia with target LDL less than 70 10/04/2012   Kidney stones 01/27/2012   Gallstones-symptomatic 01/27/2012   Hx of appendectomy-history of ruptured requiring ileocecectomy (~1994) 01/27/2012   Past Medical History:  Diagnosis Date   Blood transfusion without reported diagnosis    CAD (coronary artery disease)    GERD (gastroesophageal reflux disease)    Heart attack ([facility])    Hyperlipidemia    Hypertension 03/07/12   ECHO-WNL     08/12/11 Lexiscan MyoviewNo significant ischemia demonstrated Low risk scan There is a moderate sized dense scar in the LCX territoy unchanged from the prior study..  Post- stress EF is 40%.   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

## 2022-07-15 ENCOUNTER — Telehealth: Payer: Self-pay | Admitting: Family Medicine

## 2022-07-15 NOTE — Telephone Encounter (Signed)
Contacted [name] to schedule their annual wellness visit. Appointment made for 07/16/2022.  Thank you,  [facility] Support [facility] Direct dial  [phone]

## 2022-07-16 ENCOUNTER — Ambulatory Visit (INDEPENDENT_AMBULATORY_CARE_PROVIDER_SITE_OTHER): Payer: PPO | Admitting: *Deleted

## 2022-07-16 DIAGNOSIS — Z0000 Encounter for general adult medical examination without abnormal findings: Secondary | ICD-10-CM | POA: Diagnosis not present

## 2022-07-16 NOTE — Patient Instructions (Signed)
Bradley Oconnor , Thank you for taking time to come for your Medicare Wellness Visit. I appreciate your ongoing commitment to your health goals. Please review the following plan we discussed and let me know if I can assist you in the future.   Screening recommendations/referrals: Colonoscopy: no longer needed Recommended yearly ophthalmology/optometry visit for glaucoma screening and checkup Recommended yearly dental visit for hygiene and checkup  Vaccinations: Influenza vaccine: up to date Pneumococcal vaccine: up to date Tdap vaccine: up to date Shingles vaccine: Education provided    Advanced directives: Education provided     Preventive Care 65 Years and Older, Male Preventive care refers to lifestyle choices and visits with your health care provider that can promote health and wellness. What does preventive care include? A yearly physical exam. This is also called an annual well check. Dental exams once or twice a year. Routine eye exams. Ask your health care provider how often you should have your eyes checked. Personal lifestyle choices, including: Daily care of your teeth and gums. Regular physical activity. Eating a healthy diet. Avoiding tobacco and drug use. Limiting alcohol use. Practicing safe sex. Taking low doses of aspirin every day. Taking vitamin and mineral supplements as recommended by your health care provider. What happens during an annual well check? The services and screenings done by your health care provider during your annual well check will depend on your age, overall health, lifestyle risk factors, and family history of disease. Counseling  Your health care provider may ask you questions about your: Alcohol use. Tobacco use. Drug use. Emotional well-being. Home and relationship well-being. Sexual activity. Eating habits. History of falls. Memory and ability to understand (cognition). Work and work Astronomer. Screening  You may have the  following tests or measurements: Height, weight, and BMI. Blood pressure. Lipid and cholesterol levels. These may be checked every 5 years, or more frequently if you are over 68 years old. Skin check. Lung cancer screening. You may have this screening every year starting at age 8 if you have a 30-pack-year history of smoking and currently smoke or have quit within the past 15 years. Fecal occult blood test (FOBT) of the stool. You may have this test every year starting at age 95. Flexible sigmoidoscopy or colonoscopy. You may have a sigmoidoscopy every 5 years or a colonoscopy every 10 years starting at age 83. Prostate cancer screening. Recommendations will vary depending on your family history and other risks. Hepatitis C blood test. Hepatitis B blood test. Sexually transmitted disease (STD) testing. Diabetes screening. This is done by checking your blood sugar (glucose) after you have not eaten for a while (fasting). You may have this done every 1-3 years. Abdominal aortic aneurysm (AAA) screening. You may need this if you are a current or former smoker. Osteoporosis. You may be screened starting at age 28 if you are at high risk. Talk with your health care provider about your test results, treatment options, and if necessary, the need for more tests. Vaccines  Your health care provider may recommend certain vaccines, such as: Influenza vaccine. This is recommended every year. Tetanus, diphtheria, and acellular pertussis (Tdap, Td) vaccine. You may need a Td booster every 10 years. Zoster vaccine. You may need this after age 3. Pneumococcal 13-valent conjugate (PCV13) vaccine. One dose is recommended after age 35. Pneumococcal polysaccharide (PPSV23) vaccine. One dose is recommended after age 63. Talk to your health care provider about which screenings and vaccines you need and how often you need them.  This information is not intended to replace advice given to you by your health care  provider. Make sure you discuss any questions you have with your health care provider. Document Released: 04/12/2015 Document Revised: 12/04/2015 Document Reviewed: 01/15/2015 Elsevier Interactive Patient Education  2017 [employer] Prevention in the Home Falls can cause injuries. They can happen to people of all ages. There are many things you can do to make your home safe and to help prevent falls. What can I do on the outside of my home? Regularly fix the edges of walkways and driveways and fix any cracks. Remove anything that might make you trip as you walk through a door, such as a raised step or threshold. Trim any bushes or trees on the path to your home. Use bright outdoor lighting. Clear any walking paths of anything that might make someone trip, such as rocks or tools. Regularly check to see if handrails are loose or broken. Make sure that both sides of any steps have handrails. Any raised decks and porches should have guardrails on the edges. Have any leaves, snow, or ice cleared regularly. Use sand or salt on walking paths during winter. Clean up any spills in your garage right away. This includes oil or grease spills. What can I do in the bathroom? Use night lights. Install grab bars by the toilet and in the tub and shower. Do not use towel bars as grab bars. Use non-skid mats or decals in the tub or shower. If you need to sit down in the shower, use a plastic, non-slip stool. Keep the floor dry. Clean up any water that spills on the floor as soon as it happens. Remove soap buildup in the tub or shower regularly. Attach bath mats securely with double-sided non-slip rug tape. Do not have throw rugs and other things on the floor that can make you trip. What can I do in the bedroom? Use night lights. Make sure that you have a light by your bed that is easy to reach. Do not use any sheets or blankets that are too big for your bed. They should not hang down onto the  floor. Have a firm chair that has side arms. You can use this for support while you get dressed. Do not have throw rugs and other things on the floor that can make you trip. What can I do in the kitchen? Clean up any spills right away. Avoid walking on wet floors. Keep items that you use a lot in easy-to-reach places. If you need to reach something above you, use a strong step stool that has a grab bar. Keep electrical cords out of the way. Do not use floor polish or wax that makes floors slippery. If you must use wax, use non-skid floor wax. Do not have throw rugs and other things on the floor that can make you trip. What can I do with my stairs? Do not leave any items on the stairs. Make sure that there are handrails on both sides of the stairs and use them. Fix handrails that are broken or loose. Make sure that handrails are as long as the stairways. Check any carpeting to make sure that it is firmly attached to the stairs. Fix any carpet that is loose or worn. Avoid having throw rugs at the top or bottom of the stairs. If you do have throw rugs, attach them to the floor with carpet tape. Make sure that you have a light switch at the  top of the stairs and the bottom of the stairs. If you do not have them, ask someone to add them for you. What else can I do to help prevent falls? Wear shoes that: Do not have high heels. Have rubber bottoms. Are comfortable and fit you well. Are closed at the toe. Do not wear sandals. If you use a stepladder: Make sure that it is fully opened. Do not climb a closed stepladder. Make sure that both sides of the stepladder are locked into place. Ask someone to hold it for you, if possible. Clearly mark and make sure that you can see: Any grab bars or handrails. First and last steps. Where the edge of each step is. Use tools that help you move around (mobility aids) if they are needed. These include: Canes. Walkers. Scooters. Crutches. Turn on the  lights when you go into a dark area. Replace any light bulbs as soon as they burn out. Set up your furniture so you have a clear path. Avoid moving your furniture around. If any of your floors are uneven, fix them. If there are any pets around you, be aware of where they are. Review your medicines with your doctor. Some medicines can make you feel dizzy. This can increase your chance of falling. Ask your doctor what other things that you can do to help prevent falls. This information is not intended to replace advice given to you by your health care provider. Make sure you discuss any questions you have with your health care provider. Document Released: 01/10/2009 Document Revised: 08/22/2015 Document Reviewed: 04/20/2014 Elsevier Interactive Patient Education  2017 [employer].

## 2022-07-16 NOTE — Progress Notes (Signed)
Subjective:   Bradley Oconnor is a 82 y.o. male who presents for Medicare Annual/Subsequent preventive examination.  I connected with  [name] on 07/16/22 by a telephone enabled telemedicine application and verified that I am speaking with the correct person using two identifiers.   I discussed the limitations of evaluation and management by telemedicine. The patient expressed understanding and agreed to proceed.  Patient location: home  Provider location: telephone/home   Review of Systems     Cardiac Risk Factors include: advanced age (>79men, >43 women);male gender;hypertension;family history of premature cardiovascular disease     Objective:    Today's Vitals   There is no height or weight on file to calculate BMI.     07/16/2022    4:12 PM 08/07/2021    1:01 PM 10/10/2019   11:14 AM 07/22/2019    9:15 PM 02/16/2017    1:44 PM 03/31/2013    4:10 PM  Advanced Directives  Does Patient Have a Medical Advance Directive? No No No Yes No Patient has advance directive, copy not in chart  Type of Advance Directive    Healthcare Power of [employer] Power of Attorney  Does patient want to make changes to medical advance directive?     No - Patient declined No  Would patient like information on creating a medical advance directive? No - Patient declined No - Patient declined No - Patient declined       Current Medications (verified)    Allergies (verified) Procardia [nifedipine] and Phenergan [promethazine hcl]   History: Past Medical History:  Diagnosis Date   Blood transfusion without reported diagnosis    CAD (coronary artery disease)    GERD (gastroesophageal reflux disease)    Heart attack    Hyperlipidemia    Hypertension 03/07/12   ECHO-WNL     08/12/11 Lexiscan MyoviewNo significant ischemia demonstrated Low risk scan There is a moderate sized dense scar in the LCX territoy unchanged from the prior study.. Post- stress EF is 40%.   Thyroid  disease    Past Surgical History:  Procedure Laterality Date   APPENDECTOMY     CERVICAL SPINE SURGERY     titanium plate in the back of neck   CORONARY ARTERY BYPASS GRAFT     HERNIA REPAIR     SMALL INTESTINE SURGERY     THYROID SURGERY     1/2 thyroid removed on right side   Family History  Problem Relation Age of Onset   Heart disease Mother    Cancer Mother        breast, stomach   Heart disease Father    Hyperlipidemia Father    Heart disease Brother    Diabetes Brother    Hyperlipidemia Brother    Heart disease Paternal Grandfather    Healthy Brother    Social History   Socioeconomic History   Marital status: Married    Spouse name: Not on file   Number of children: Not on file   Years of education: 16   Highest education level: Some college, no degree  Occupational History   Occupation: Retired  Tobacco Use   Smoking status: Never   Smokeless tobacco: Never  Building services engineer Use: Never used  Substance and Sexual Activity   Alcohol use: No    Alcohol/week: 0.0 standard drinks of alcohol   Drug use: No   Sexual activity: Never  Other Topics Concern   Not on file  Social History  Narrative   Married.    Social Determinants of Health   Financial Resource Strain: Low Risk  (07/16/2022)   Overall Financial Resource Strain (CARDIA)    Difficulty of Paying Living Expenses: Not hard at all  Food Insecurity: No Food Insecurity (07/16/2022)   Hunger Vital Sign    Worried About Running Out of Food in the Last Year: Never true    Ran Out of Food in the Last Year: Never true  Transportation Needs: No Transportation Needs (07/16/2022)   PRAPARE - Administrator, Civil Service (Medical): No    Lack of Transportation (Non-Medical): No  Physical Activity: Insufficiently Active (07/16/2022)   Exercise Vital Sign    Days of Exercise per Week: 3 days    Minutes of Exercise per Session: 30 min  Stress: No Stress Concern Present (07/16/2022)   [employer] of Occupational Health - Occupational Stress Questionnaire    Feeling of Stress : Not at all  Social Connections: Moderately Isolated (07/16/2022)   Social Connection and Isolation Panel [NHANES]    Frequency of Communication with Friends and Family: Three times a week    Frequency of Social Gatherings with Friends and Family: Three times a week    Attends Religious Services: Never    Active Member of Clubs or Organizations: No    Attends Engineer, structural: Never    Marital Status: Married    Tobacco Counseling Counseling given: Not Answered   Clinical Intake:  Pre-visit preparation completed: Yes  Pain : No/denies pain     Diabetes: No  How often do you need to have someone help you when you read instructions, pamphlets, or other written materials from your doctor or pharmacy?: 1 - Never  Diabetic?  no  Interpreter Needed?: No  Information entered by :: Remi Haggard LPN   Activities of Daily Living    07/16/2022    4:13 PM 08/07/2021   12:45 PM  In your present state of health, do you have any difficulty performing the following activities:  Hearing? 1 0  Vision? 0 0  Difficulty concentrating or making decisions? 0 0  Walking or climbing stairs? 1 0  Dressing or bathing? 1 0  Doing errands, shopping? 0 0  Preparing Food and eating ? N N  Using the Toilet? N N  In the past six months, have you accidently leaked urine? N N  Do you have problems with loss of bowel control? N N  Managing your Medications? N N  Managing your Finances? N N  Housekeeping or managing your Housekeeping? N N    Patient Care Team: [name], MD as PCP - General (Family Medicine) Lennette Bihari, MD as PCP - Cardiology (Cardiology) Lennette Bihari, MD as Consulting Physician (Cardiology) Erroll Luna, [facility] as Pharmacist (Pharmacist)  Indicate any recent Medical Services you may have received from other than [facility] providers in the past year (date may be  approximate).     Assessment:   This is a routine wellness examination for [place].  Hearing/Vision screen Hearing Screening - Comments:: Some hearing loss No hearing aids  Vision Screening - Comments:: Up to date [place]  Dietary issues and exercise activities discussed: Current Exercise Habits: Home exercise routine, Type of exercise: walking, Time (Minutes): 30, Frequency (Times/Week): 4, Weekly Exercise (Minutes/Week): 120, Intensity: Mild, Exercise limited by: orthopedic condition(s)   Goals Addressed             This Visit's Progress  Patient Stated       Continue current lifestyle       Depression Screen    07/16/2022    4:17 PM 08/11/2021   11:41 AM 08/07/2021   12:56 PM 03/03/2021   11:07 AM 08/14/2019   10:59 AM 06/21/2019    4:19 PM 02/10/2018    1:57 PM  PHQ 2/9 Scores  PHQ - 2 Score 0 1 0 0 0 0 0  PHQ- 9 Score 0 3         Fall Risk    08/11/2021   11:41 AM 08/07/2021   12:45 PM 03/03/2021   11:07 AM 08/14/2019   10:59 AM 06/21/2019    4:19 PM  Fall Risk   Falls in the past year? 0 0 1 1 0  Number falls in past yr: 0 0 0 1   Injury with Fall? 0  0 1   Risk for fall due to : No Fall Risks  History of fall(s)    Follow up Falls evaluation completed Falls evaluation completed Falls evaluation completed Falls evaluation completed Falls evaluation completed    FALL RISK PREVENTION PERTAINING TO THE HOME:  Any stairs in or around the home? No  If so, are there any without handrails? No  Home free of loose throw rugs in walkways, pet beds, electrical cords, etc? Yes  Adequate lighting in your home to reduce risk of falls? Yes   ASSISTIVE DEVICES UTILIZED TO PREVENT FALLS:  Life alert? No  Use of a cane, walker or w/c? No  Grab bars in the bathroom? Yes  Shower chair or bench in shower? Yes  Elevated toilet seat or a handicapped toilet? No   TIMED UP AND GO:  Was the test performed? No .    Cognitive Function:        07/16/2022    4:14 PM  08/07/2021   12:51 PM 02/16/2017    2:07 PM  6CIT Screen  What Year? 0 points 0 points 0 points  What month? 0 points 0 points 0 points  What time? 0 points 0 points 0 points  Count back from 20 0 points 0 points 0 points  Months in reverse 2 points 0 points 0 points  Repeat phrase 0 points 0 points 0 points  Total Score 2 points 0 points 0 points    Immunizations Immunization History  Administered Date(s) Administered   Fluad Quad(high Dose 65+) 03/17/2019, 01/17/2020, 02/27/2021   Influenza Split 12/09/2011   Influenza, High Dose Seasonal PF 02/10/2018   Influenza,inj,Quad PF,6+ Mos 02/01/2013, 02/01/2014, 01/17/2015, 12/17/2015, 12/31/2016   PFIZER(Purple Top)SARS-COV-2 Vaccination 06/13/2019, 07/04/2019, 03/03/2021   PNEUMOCOCCAL CONJUGATE-20 02/27/2021   Pneumococcal Polysaccharide-23 04/17/2014   Tdap 06/09/2019    TDAP status: Up to date  Flu Vaccine status: Up to date  Pneumococcal vaccine status: Up to date  Covid-19 vaccine status: Completed vaccines  Qualifies for Shingles Vaccine? Yes   Zostavax completed No   Shingrix Completed?: No.    Education has been provided regarding the importance of this vaccine. Patient has been advised to call insurance company to determine out of pocket expense if they have not yet received this vaccine. Advised may also receive vaccine at local pharmacy or Health Dept. Verbalized acceptance and understanding.  Screening Tests Health Maintenance  Topic Date Due   COVID-19 Vaccine (4 - 2023-24 season) 08/01/2022 (Originally 11/28/2021)   Zoster Vaccines- Shingrix (1 of 2) 10/15/2022 (Originally [birth year]1989)   INFLUENZA VACCINE  10/29/2022   Medicare  Annual Wellness (AWV)  07/16/2023   DTaP/Tdap/Td (2 - Td or Tdap) 06/08/2029   Pneumonia Vaccine 55+ Years old  Completed   HPV VACCINES  Aged Out    Health Maintenance  There are no preventive care reminders to display for this patient.   Colorectal cancer screening: No longer  required.   Lung Cancer Screening: (Low Dose CT Chest recommended if Age 1-80 years, 30 pack-year currently smoking OR have quit w/in 15years.) does not qualify.   Lung Cancer Screening Referral:   Additional Screening:  Hepatitis C Screening: does not qualify; Completed   Vision Screening: Recommended annual ophthalmology exams for early detection of glaucoma and other disorders of the eye. Is the patient up to date with their annual eye exam?  Yes  Who is the provider or what is the name of the office in which the patient attends annual eye exams? [facility] If pt is not established with a provider, would they like to be referred to a provider to establish care? No .   Dental Screening: Recommended annual dental exams for proper oral hygiene  Community Resource Referral / Chronic Care Management: CRR required this visit?  No   CCM required this visit?  No      Plan:     I have personally reviewed and noted the following in the patient's chart:   Medical and social history Use of alcohol, tobacco or illicit drugs  Current medications and supplements including opioid prescriptions. Patient is not currently taking opioid prescriptions. Functional ability and status Nutritional status Physical activity Advanced directives List of other physicians Hospitalizations, surgeries, and ER visits in previous 12 months Vitals Screenings to include cognitive, depression, and falls Referrals and appointments  In addition, I have reviewed and discussed with patient certain preventive protocols, quality metrics, and best practice recommendations. A written personalized care plan for preventive services as well as general preventive health recommendations were provided to patient.     Remi Haggard, LPN   [id number]/61/0960   Nurse Notes:

## 2022-07-21 DIAGNOSIS — H5213 Myopia, bilateral: Secondary | ICD-10-CM | POA: Diagnosis not present

## 2022-07-21 DIAGNOSIS — Z961 Presence of intraocular lens: Secondary | ICD-10-CM | POA: Diagnosis not present

## 2022-08-07 DIAGNOSIS — L820 Inflamed seborrheic keratosis: Secondary | ICD-10-CM | POA: Diagnosis not present

## 2022-08-07 DIAGNOSIS — D2339 Other benign neoplasm of skin of other parts of face: Secondary | ICD-10-CM | POA: Diagnosis not present

## 2022-08-07 DIAGNOSIS — L570 Actinic keratosis: Secondary | ICD-10-CM | POA: Diagnosis not present

## 2022-08-07 DIAGNOSIS — Z85828 Personal history of other malignant neoplasm of skin: Secondary | ICD-10-CM | POA: Diagnosis not present

## 2022-09-01 ENCOUNTER — Other Ambulatory Visit: Payer: Self-pay | Admitting: Cardiovascular Disease

## 2022-09-14 ENCOUNTER — Other Ambulatory Visit: Payer: Self-pay

## 2022-09-14 ENCOUNTER — Emergency Department (HOSPITAL_BASED_OUTPATIENT_CLINIC_OR_DEPARTMENT_OTHER): Payer: Medicare PPO

## 2022-09-14 ENCOUNTER — Emergency Department
Admission: EM | Admit: 2022-09-14 | Discharge: 2022-09-14 | Disposition: A | Discharge status: Other Acute Inpatient Hospital | Payer: Medicare PPO | Attending: Emergency Medicine | Admitting: Emergency Medicine

## 2022-09-14 ENCOUNTER — Encounter (HOSPITAL_BASED_OUTPATIENT_CLINIC_OR_DEPARTMENT_OTHER): Payer: Self-pay

## 2022-09-14 DIAGNOSIS — R9431 Abnormal electrocardiogram [ECG] [EKG]: Secondary | ICD-10-CM | POA: Insufficient documentation

## 2022-09-14 DIAGNOSIS — E876 Hypokalemia: Secondary | ICD-10-CM | POA: Insufficient documentation

## 2022-09-14 DIAGNOSIS — U071 COVID-19: Secondary | ICD-10-CM | POA: Insufficient documentation

## 2022-09-14 DIAGNOSIS — I10 Essential (primary) hypertension: Secondary | ICD-10-CM

## 2022-09-14 DIAGNOSIS — R0902 Hypoxemia: Secondary | ICD-10-CM | POA: Insufficient documentation

## 2022-09-14 DIAGNOSIS — R0602 Shortness of breath: Secondary | ICD-10-CM | POA: Insufficient documentation

## 2022-09-14 DIAGNOSIS — R531 Weakness: Secondary | ICD-10-CM | POA: Insufficient documentation

## 2022-09-14 DIAGNOSIS — R109 Unspecified abdominal pain: Secondary | ICD-10-CM

## 2022-09-14 DIAGNOSIS — R918 Other nonspecific abnormal finding of lung field: Secondary | ICD-10-CM | POA: Diagnosis not present

## 2022-09-14 DIAGNOSIS — I110 Hypertensive heart disease with heart failure: Secondary | ICD-10-CM | POA: Diagnosis not present

## 2022-09-14 DIAGNOSIS — E039 Hypothyroidism, unspecified: Secondary | ICD-10-CM | POA: Diagnosis not present

## 2022-09-14 DIAGNOSIS — J9601 Acute respiratory failure with hypoxia: Secondary | ICD-10-CM | POA: Diagnosis not present

## 2022-09-14 DIAGNOSIS — E162 Hypoglycemia, unspecified: Secondary | ICD-10-CM | POA: Diagnosis not present

## 2022-09-14 DIAGNOSIS — I252 Old myocardial infarction: Secondary | ICD-10-CM | POA: Diagnosis not present

## 2022-09-14 DIAGNOSIS — M6281 Muscle weakness (generalized): Secondary | ICD-10-CM | POA: Diagnosis not present

## 2022-09-14 DIAGNOSIS — R1310 Dysphagia, unspecified: Secondary | ICD-10-CM | POA: Diagnosis not present

## 2022-09-14 DIAGNOSIS — Z4682 Encounter for fitting and adjustment of non-vascular catheter: Secondary | ICD-10-CM | POA: Diagnosis not present

## 2022-09-14 DIAGNOSIS — I5033 Acute on chronic diastolic (congestive) heart failure: Secondary | ICD-10-CM | POA: Diagnosis not present

## 2022-09-14 DIAGNOSIS — R627 Adult failure to thrive: Secondary | ICD-10-CM | POA: Diagnosis not present

## 2022-09-14 DIAGNOSIS — E46 Unspecified protein-calorie malnutrition: Secondary | ICD-10-CM | POA: Diagnosis not present

## 2022-09-14 DIAGNOSIS — I499 Cardiac arrhythmia, unspecified: Secondary | ICD-10-CM | POA: Diagnosis not present

## 2022-09-14 DIAGNOSIS — F339 Major depressive disorder, recurrent, unspecified: Secondary | ICD-10-CM | POA: Diagnosis not present

## 2022-09-14 DIAGNOSIS — F32A Depression, unspecified: Secondary | ICD-10-CM | POA: Diagnosis not present

## 2022-09-14 DIAGNOSIS — E8809 Other disorders of plasma-protein metabolism, not elsewhere classified: Secondary | ICD-10-CM | POA: Diagnosis not present

## 2022-09-14 DIAGNOSIS — I088 Other rheumatic multiple valve diseases: Secondary | ICD-10-CM | POA: Diagnosis not present

## 2022-09-14 DIAGNOSIS — J129 Viral pneumonia, unspecified: Secondary | ICD-10-CM | POA: Diagnosis not present

## 2022-09-14 DIAGNOSIS — D72829 Elevated white blood cell count, unspecified: Secondary | ICD-10-CM | POA: Diagnosis not present

## 2022-09-14 DIAGNOSIS — N3946 Mixed incontinence: Secondary | ICD-10-CM | POA: Diagnosis not present

## 2022-09-14 DIAGNOSIS — J189 Pneumonia, unspecified organism: Secondary | ICD-10-CM | POA: Diagnosis not present

## 2022-09-14 DIAGNOSIS — E870 Hyperosmolality and hypernatremia: Secondary | ICD-10-CM | POA: Diagnosis not present

## 2022-09-14 DIAGNOSIS — J984 Other disorders of lung: Secondary | ICD-10-CM | POA: Diagnosis not present

## 2022-09-14 DIAGNOSIS — I2510 Atherosclerotic heart disease of native coronary artery without angina pectoris: Secondary | ICD-10-CM | POA: Diagnosis not present

## 2022-09-14 DIAGNOSIS — L89153 Pressure ulcer of sacral region, stage 3: Secondary | ICD-10-CM | POA: Diagnosis not present

## 2022-09-14 DIAGNOSIS — E871 Hypo-osmolality and hyponatremia: Secondary | ICD-10-CM | POA: Diagnosis not present

## 2022-09-14 DIAGNOSIS — E7841 Elevated Lipoprotein(a): Secondary | ICD-10-CM | POA: Diagnosis not present

## 2022-09-14 DIAGNOSIS — I5030 Unspecified diastolic (congestive) heart failure: Secondary | ICD-10-CM | POA: Diagnosis not present

## 2022-09-14 DIAGNOSIS — J90 Pleural effusion, not elsewhere classified: Secondary | ICD-10-CM | POA: Diagnosis not present

## 2022-09-14 DIAGNOSIS — J1282 Pneumonia due to coronavirus disease 2019: Secondary | ICD-10-CM | POA: Diagnosis not present

## 2022-09-14 DIAGNOSIS — R5381 Other malaise: Secondary | ICD-10-CM | POA: Diagnosis not present

## 2022-09-14 DIAGNOSIS — Z8616 Personal history of COVID-19: Secondary | ICD-10-CM | POA: Diagnosis not present

## 2022-09-14 DIAGNOSIS — N179 Acute kidney failure, unspecified: Secondary | ICD-10-CM | POA: Diagnosis not present

## 2022-09-14 DIAGNOSIS — E785 Hyperlipidemia, unspecified: Secondary | ICD-10-CM | POA: Diagnosis not present

## 2022-09-14 DIAGNOSIS — K219 Gastro-esophageal reflux disease without esophagitis: Secondary | ICD-10-CM | POA: Diagnosis not present

## 2022-09-14 DIAGNOSIS — J9621 Acute and chronic respiratory failure with hypoxia: Secondary | ICD-10-CM | POA: Diagnosis not present

## 2022-09-14 DIAGNOSIS — I5032 Chronic diastolic (congestive) heart failure: Secondary | ICD-10-CM | POA: Diagnosis not present

## 2022-09-14 DIAGNOSIS — E43 Unspecified severe protein-calorie malnutrition: Secondary | ICD-10-CM | POA: Diagnosis not present

## 2022-09-14 DIAGNOSIS — R41841 Cognitive communication deficit: Secondary | ICD-10-CM | POA: Diagnosis not present

## 2022-09-14 DIAGNOSIS — I2722 Pulmonary hypertension due to left heart disease: Secondary | ICD-10-CM | POA: Diagnosis not present

## 2022-09-14 DIAGNOSIS — Z951 Presence of aortocoronary bypass graft: Secondary | ICD-10-CM | POA: Diagnosis not present

## 2022-09-14 LAB — URINALYSIS, MACRO/MICRO
BILIRUBIN: NEGATIVE mg/dL
GLUCOSE: NEGATIVE mg/dL
KETONES: NEGATIVE mg/dL
LEUKOCYTES: NEGATIVE WBCs/uL
NITRITE: NEGATIVE
PH: 6 (ref 4.6–8.0)
PROTEIN: NEGATIVE mg/dL
SPECIFIC GRAVITY: 1.02 (ref 1.003–1.035)
UROBILINOGEN: 1 mg/dL (ref 0.2–1.0)

## 2022-09-14 LAB — COMPREHENSIVE METABOLIC PANEL, NON-FASTING
ALBUMIN/GLOBULIN RATIO: 0.7 — ABNORMAL LOW (ref 0.8–1.4)
ALBUMIN: 3.2 g/dL — ABNORMAL LOW (ref 3.4–5.0)
ALKALINE PHOSPHATASE: 73 U/L (ref 46–116)
ALT (SGPT): 24 U/L (ref <=78)
ANION GAP: 9 mmol/L (ref 4–13)
AST (SGOT): 23 U/L (ref 15–37)
BILIRUBIN TOTAL: 1.4 mg/dL — ABNORMAL HIGH (ref 0.2–1.0)
BUN/CREA RATIO: 12
BUN: 13 mg/dL (ref 7–18)
CALCIUM, CORRECTED: 7.9 mg/dL
CALCIUM: 7.3 mg/dL — ABNORMAL LOW (ref 8.5–10.1)
CHLORIDE: 98 mmol/L (ref 98–107)
CO2 TOTAL: 33 mmol/L — ABNORMAL HIGH (ref 21–32)
CREATININE: 1.13 mg/dL (ref 0.70–1.30)
ESTIMATED GFR: 64 mL/min/{1.73_m2} (ref >59)
GLOBULIN: 4.4
GLUCOSE: 155 mg/dL — ABNORMAL HIGH (ref 74–106)
OSMOLALITY, CALCULATED: 283 mOsm/kg (ref 270–290)
POTASSIUM: 2.7 mmol/L — CL (ref 3.5–5.1)
PROTEIN TOTAL: 7.6 g/dL (ref 6.4–8.2)
SODIUM: 140 mmol/L (ref 136–145)

## 2022-09-14 LAB — URINALYSIS, MICROSCOPIC

## 2022-09-14 LAB — CBC WITH DIFF
BASOPHIL #: 0.01 10*3/uL (ref 0.00–0.30)
BASOPHIL %: 0 % (ref 0–3)
EOSINOPHIL #: 0.05 10*3/uL (ref 0.00–0.80)
EOSINOPHIL %: 1 % (ref 0–7)
HCT: 38.5 % — ABNORMAL LOW (ref 42.0–51.0)
HGB: 12.7 g/dL — ABNORMAL LOW (ref 13.5–18.0)
LYMPHOCYTE #: 0.85 10*3/uL — ABNORMAL LOW (ref 1.10–5.00)
LYMPHOCYTE %: 12 % — ABNORMAL LOW (ref 25–45)
MCH: 31.8 pg (ref 27.0–32.0)
MCHC: 32.9 g/dL (ref 32.0–36.0)
MCV: 96.8 fL (ref 78.0–99.0)
MONOCYTE #: 0.64 10*3/uL (ref 0.00–1.30)
MONOCYTE %: 9 % (ref 0–12)
MPV: 6.7 fL — ABNORMAL LOW (ref 7.4–10.4)
NEUTROPHIL #: 5.72 10*3/uL (ref 1.80–8.40)
NEUTROPHIL %: 79 % — ABNORMAL HIGH (ref 40–76)
PLATELETS: 158 10*3/uL (ref 140–440)
RBC: 3.98 10*6/uL — ABNORMAL LOW (ref 4.20–6.00)
RDW: 15.8 % — ABNORMAL HIGH (ref 11.6–14.8)
WBC: 7.3 10*3/uL (ref 4.0–10.5)

## 2022-09-14 LAB — PTT (PARTIAL THROMBOPLASTIN TIME): APTT: 28 seconds (ref 25.0–38.0)

## 2022-09-14 LAB — ARTERIAL BLOOD GAS/LACTATE
%FIO2 (ARTERIAL): 21 %
BASE EXCESS (ARTERIAL): 5.4 mmol/L — ABNORMAL HIGH (ref 0.0–2.0)
BICARBONATE (ARTERIAL): 29.2 mmol/L — ABNORMAL HIGH (ref 20.0–26.0)
CARBOXYHEMOGLOBIN: 1.6 % — ABNORMAL HIGH (ref <=1.5)
LACTATE: 1.9 mmol/L (ref <=2.0)
MET-HEMOGLOBIN: 0 % (ref <=2.0)
O2CT: 13 %
OXYHEMOGLOBIN: 81.5 % — CL (ref 88.0–100.0)
PCO2 (ARTERIAL): 39 mm/Hg (ref 35–45)
PH (ARTERIAL): 7.48 — ABNORMAL HIGH (ref 7.35–7.45)
PO2 (ARTERIAL): 44 mm/Hg — CL (ref 80–100)

## 2022-09-14 LAB — PT/INR
INR: 1.19 — ABNORMAL HIGH (ref 0.84–1.10)
PROTHROMBIN TIME: 13.9 seconds — ABNORMAL HIGH (ref 9.8–12.7)

## 2022-09-14 LAB — COVID-19, FLU A/B, RSV RAPID BY PCR
INFLUENZA VIRUS TYPE A: NOT DETECTED
INFLUENZA VIRUS TYPE B: NOT DETECTED
RESPIRATORY SYNCTIAL VIRUS (RSV): NOT DETECTED
SARS-CoV-2: DETECTED — AB

## 2022-09-14 LAB — LIPASE: LIPASE: 26 U/L (ref 16–77)

## 2022-09-14 MED ORDER — ONDANSETRON HCL (PF) 4 MG/2 ML INJECTION SOLUTION
INTRAMUSCULAR | Status: AC
Start: 2022-09-14 — End: 2022-09-14
  Filled 2022-09-14: qty 2

## 2022-09-14 MED ORDER — SODIUM CHLORIDE 0.9 % IV BOLUS
1000.0000 mL | INJECTION | Status: AC
Start: 2022-09-14 — End: 2022-09-14
  Administered 2022-09-14: 0 mL via INTRAVENOUS
  Administered 2022-09-14: 1000 mL via INTRAVENOUS

## 2022-09-14 MED ORDER — POTASSIUM CHLORIDE 20 MEQ/100ML IN STERILE WATER INTRAVENOUS PIGGYBACK
INJECTION | INTRAVENOUS | Status: AC
Start: 2022-09-14 — End: 2022-09-14
  Filled 2022-09-14: qty 100

## 2022-09-14 MED ORDER — IPRATROPIUM 0.5 MG-ALBUTEROL 3 MG (2.5 MG BASE)/3 ML NEBULIZATION SOLN
3.0000 mL | INHALATION_SOLUTION | RESPIRATORY_TRACT | Status: AC
Start: 2022-09-14 — End: 2022-09-14
  Administered 2022-09-14: 3 mL via RESPIRATORY_TRACT

## 2022-09-14 MED ORDER — METHYLPREDNISOLONE SOD SUCC 125 MG SOLUTION FOR INJECTION WRAPPER
125.0000 mg | INTRAVENOUS | Status: AC
Start: 2022-09-14 — End: 2022-09-14
  Administered 2022-09-14: 125 mg via INTRAVENOUS

## 2022-09-14 MED ORDER — IPRATROPIUM 0.5 MG-ALBUTEROL 3 MG (2.5 MG BASE)/3 ML NEBULIZATION SOLN
INHALATION_SOLUTION | RESPIRATORY_TRACT | Status: AC
Start: 2022-09-14 — End: 2022-09-14
  Filled 2022-09-14: qty 3

## 2022-09-14 MED ORDER — POTASSIUM CHLORIDE ER 20 MEQ TABLET,EXTENDED RELEASE(PART/CRYST)
40.0000 meq | ORAL_TABLET | ORAL | Status: AC
Start: 2022-09-14 — End: 2022-09-14
  Administered 2022-09-14: 40 meq via ORAL

## 2022-09-14 MED ORDER — POTASSIUM CHLORIDE ER 20 MEQ TABLET,EXTENDED RELEASE(PART/CRYST)
ORAL_TABLET | ORAL | Status: AC
Start: 2022-09-14 — End: 2022-09-14
  Filled 2022-09-14: qty 2

## 2022-09-14 MED ORDER — ONDANSETRON HCL (PF) 4 MG/2 ML INJECTION SOLUTION
4.0000 mg | INTRAMUSCULAR | Status: AC
Start: 2022-09-14 — End: 2022-09-14
  Administered 2022-09-14: 4 mg via INTRAVENOUS

## 2022-09-14 MED ORDER — POTASSIUM CHLORIDE 20 MEQ/100ML IN STERILE WATER INTRAVENOUS PIGGYBACK
20.0000 meq | INJECTION | INTRAVENOUS | Status: AC
Start: 2022-09-14 — End: 2022-09-14
  Administered 2022-09-14: 0 meq via INTRAVENOUS
  Administered 2022-09-14: 20 meq via INTRAVENOUS

## 2022-09-14 MED ORDER — SODIUM CHLORIDE 0.9 % INTRAVENOUS SOLUTION
INTRAVENOUS | Status: DC
Start: 2022-09-14 — End: 2022-09-14

## 2022-09-14 MED ORDER — METHYLPREDNISOLONE SOD SUCC 125 MG SOLUTION FOR INJECTION WRAPPER
INTRAVENOUS | Status: AC
Start: 2022-09-14 — End: 2022-09-14
  Filled 2022-09-14: qty 2

## 2022-09-14 NOTE — ED Nurses Note (Signed)
Accepted to [facility] 210 IV of Ns infusing at 125cc/hr   report called to Alexia RN  awaiting transportation

## 2022-09-14 NOTE — ED Nurses Note (Signed)
HR fluctuating  65 to 90  O2 sat fluctuating 79 to 93 denies SOB  denies CP provider informed

## 2022-09-14 NOTE — ED Triage Notes (Signed)
Patient reports increased weakness for the last three days, denies any chest pain, shortness of breath, or vomiting. Patient does report nausea, fever, decreased appetite.
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
## 2022-09-15 DIAGNOSIS — I2510 Atherosclerotic heart disease of native coronary artery without angina pectoris: Secondary | ICD-10-CM | POA: Diagnosis not present

## 2022-09-15 DIAGNOSIS — I10 Essential (primary) hypertension: Secondary | ICD-10-CM | POA: Diagnosis not present

## 2022-09-15 DIAGNOSIS — E785 Hyperlipidemia, unspecified: Secondary | ICD-10-CM | POA: Diagnosis not present

## 2022-09-15 DIAGNOSIS — U071 COVID-19: Secondary | ICD-10-CM | POA: Diagnosis not present

## 2022-09-15 DIAGNOSIS — J9601 Acute respiratory failure with hypoxia: Secondary | ICD-10-CM | POA: Diagnosis not present

## 2022-09-15 DIAGNOSIS — R531 Weakness: Secondary | ICD-10-CM | POA: Diagnosis not present

## 2022-09-15 DIAGNOSIS — I2722 Pulmonary hypertension due to left heart disease: Secondary | ICD-10-CM | POA: Diagnosis not present

## 2022-09-15 DIAGNOSIS — Z951 Presence of aortocoronary bypass graft: Secondary | ICD-10-CM | POA: Diagnosis not present

## 2022-09-15 DIAGNOSIS — I088 Other rheumatic multiple valve diseases: Secondary | ICD-10-CM | POA: Diagnosis not present

## 2022-09-15 DIAGNOSIS — E876 Hypokalemia: Secondary | ICD-10-CM | POA: Diagnosis not present

## 2022-09-16 DIAGNOSIS — I252 Old myocardial infarction: Secondary | ICD-10-CM

## 2022-09-16 DIAGNOSIS — R9431 Abnormal electrocardiogram [ECG] [EKG]: Secondary | ICD-10-CM

## 2022-09-16 DIAGNOSIS — I499 Cardiac arrhythmia, unspecified: Secondary | ICD-10-CM

## 2022-09-16 DIAGNOSIS — J984 Other disorders of lung: Secondary | ICD-10-CM | POA: Diagnosis not present

## 2022-09-16 DIAGNOSIS — U071 COVID-19: Secondary | ICD-10-CM | POA: Diagnosis not present

## 2022-09-16 DIAGNOSIS — R918 Other nonspecific abnormal finding of lung field: Secondary | ICD-10-CM | POA: Diagnosis not present

## 2022-09-16 DIAGNOSIS — J9601 Acute respiratory failure with hypoxia: Secondary | ICD-10-CM | POA: Diagnosis not present

## 2022-09-16 DIAGNOSIS — R531 Weakness: Secondary | ICD-10-CM | POA: Diagnosis not present

## 2022-09-16 DIAGNOSIS — R0902 Hypoxemia: Secondary | ICD-10-CM | POA: Diagnosis not present

## 2022-09-16 DIAGNOSIS — E876 Hypokalemia: Secondary | ICD-10-CM | POA: Diagnosis not present

## 2022-09-16 DIAGNOSIS — J90 Pleural effusion, not elsewhere classified: Secondary | ICD-10-CM | POA: Diagnosis not present

## 2022-09-16 LAB — ECG 12 LEAD
Atrial Rate: 234 {beats}/min
Calculated R Axis: -10 degrees
Calculated T Axis: 118 degrees
QRS Duration: 88 ms
QT Interval: 364 ms
QTC Calculation: 401 ms
Ventricular rate: 73 {beats}/min

## 2022-09-17 DIAGNOSIS — I10 Essential (primary) hypertension: Secondary | ICD-10-CM | POA: Diagnosis not present

## 2022-09-17 DIAGNOSIS — U071 COVID-19: Secondary | ICD-10-CM | POA: Diagnosis not present

## 2022-09-17 DIAGNOSIS — Z951 Presence of aortocoronary bypass graft: Secondary | ICD-10-CM | POA: Diagnosis not present

## 2022-09-17 DIAGNOSIS — E785 Hyperlipidemia, unspecified: Secondary | ICD-10-CM | POA: Diagnosis not present

## 2022-09-17 DIAGNOSIS — I088 Other rheumatic multiple valve diseases: Secondary | ICD-10-CM | POA: Diagnosis not present

## 2022-09-17 DIAGNOSIS — I2722 Pulmonary hypertension due to left heart disease: Secondary | ICD-10-CM | POA: Diagnosis not present

## 2022-09-17 DIAGNOSIS — R531 Weakness: Secondary | ICD-10-CM | POA: Diagnosis not present

## 2022-09-17 DIAGNOSIS — J9601 Acute respiratory failure with hypoxia: Secondary | ICD-10-CM | POA: Diagnosis not present

## 2022-09-17 DIAGNOSIS — E876 Hypokalemia: Secondary | ICD-10-CM | POA: Diagnosis not present

## 2022-09-17 DIAGNOSIS — I2510 Atherosclerotic heart disease of native coronary artery without angina pectoris: Secondary | ICD-10-CM | POA: Diagnosis not present

## 2022-09-18 DIAGNOSIS — R531 Weakness: Secondary | ICD-10-CM | POA: Diagnosis not present

## 2022-09-18 DIAGNOSIS — J9601 Acute respiratory failure with hypoxia: Secondary | ICD-10-CM | POA: Diagnosis not present

## 2022-09-18 DIAGNOSIS — U071 COVID-19: Secondary | ICD-10-CM | POA: Diagnosis not present

## 2022-09-18 DIAGNOSIS — E876 Hypokalemia: Secondary | ICD-10-CM | POA: Diagnosis not present

## 2022-09-19 DIAGNOSIS — R531 Weakness: Secondary | ICD-10-CM | POA: Diagnosis not present

## 2022-09-19 DIAGNOSIS — J9601 Acute respiratory failure with hypoxia: Secondary | ICD-10-CM | POA: Diagnosis not present

## 2022-09-19 DIAGNOSIS — E876 Hypokalemia: Secondary | ICD-10-CM | POA: Diagnosis not present

## 2022-09-19 DIAGNOSIS — U071 COVID-19: Secondary | ICD-10-CM | POA: Diagnosis not present

## 2022-09-20 DIAGNOSIS — E876 Hypokalemia: Secondary | ICD-10-CM | POA: Diagnosis not present

## 2022-09-20 DIAGNOSIS — U071 COVID-19: Secondary | ICD-10-CM | POA: Diagnosis not present

## 2022-09-20 DIAGNOSIS — R531 Weakness: Secondary | ICD-10-CM | POA: Diagnosis not present

## 2022-09-20 DIAGNOSIS — J9601 Acute respiratory failure with hypoxia: Secondary | ICD-10-CM | POA: Diagnosis not present

## 2022-09-21 DIAGNOSIS — E876 Hypokalemia: Secondary | ICD-10-CM | POA: Diagnosis not present

## 2022-09-21 DIAGNOSIS — U071 COVID-19: Secondary | ICD-10-CM | POA: Diagnosis not present

## 2022-09-21 DIAGNOSIS — R531 Weakness: Secondary | ICD-10-CM | POA: Diagnosis not present

## 2022-09-21 DIAGNOSIS — J9601 Acute respiratory failure with hypoxia: Secondary | ICD-10-CM | POA: Diagnosis not present

## 2022-09-22 DIAGNOSIS — R531 Weakness: Secondary | ICD-10-CM | POA: Diagnosis not present

## 2022-09-22 DIAGNOSIS — J9601 Acute respiratory failure with hypoxia: Secondary | ICD-10-CM | POA: Diagnosis not present

## 2022-09-22 DIAGNOSIS — U071 COVID-19: Secondary | ICD-10-CM | POA: Diagnosis not present

## 2022-09-22 DIAGNOSIS — E876 Hypokalemia: Secondary | ICD-10-CM | POA: Diagnosis not present

## 2022-09-22 DIAGNOSIS — J1282 Pneumonia due to coronavirus disease 2019: Secondary | ICD-10-CM | POA: Diagnosis not present

## 2022-09-23 DIAGNOSIS — R531 Weakness: Secondary | ICD-10-CM | POA: Diagnosis not present

## 2022-09-23 DIAGNOSIS — J1282 Pneumonia due to coronavirus disease 2019: Secondary | ICD-10-CM | POA: Diagnosis not present

## 2022-09-23 DIAGNOSIS — E876 Hypokalemia: Secondary | ICD-10-CM | POA: Diagnosis not present

## 2022-09-23 DIAGNOSIS — J9601 Acute respiratory failure with hypoxia: Secondary | ICD-10-CM | POA: Diagnosis not present

## 2022-09-23 DIAGNOSIS — U071 COVID-19: Secondary | ICD-10-CM | POA: Diagnosis not present

## 2022-09-24 DIAGNOSIS — U071 COVID-19: Secondary | ICD-10-CM | POA: Diagnosis not present

## 2022-09-24 DIAGNOSIS — E876 Hypokalemia: Secondary | ICD-10-CM | POA: Diagnosis not present

## 2022-09-24 DIAGNOSIS — R531 Weakness: Secondary | ICD-10-CM | POA: Diagnosis not present

## 2022-09-24 DIAGNOSIS — J1282 Pneumonia due to coronavirus disease 2019: Secondary | ICD-10-CM | POA: Diagnosis not present

## 2022-09-24 DIAGNOSIS — J9601 Acute respiratory failure with hypoxia: Secondary | ICD-10-CM | POA: Diagnosis not present

## 2022-09-25 DIAGNOSIS — E876 Hypokalemia: Secondary | ICD-10-CM | POA: Diagnosis not present

## 2022-09-25 DIAGNOSIS — R531 Weakness: Secondary | ICD-10-CM | POA: Diagnosis not present

## 2022-09-25 DIAGNOSIS — J1282 Pneumonia due to coronavirus disease 2019: Secondary | ICD-10-CM | POA: Diagnosis not present

## 2022-09-25 DIAGNOSIS — U071 COVID-19: Secondary | ICD-10-CM | POA: Diagnosis not present

## 2022-09-25 DIAGNOSIS — J9601 Acute respiratory failure with hypoxia: Secondary | ICD-10-CM | POA: Diagnosis not present

## 2022-09-26 DIAGNOSIS — J1282 Pneumonia due to coronavirus disease 2019: Secondary | ICD-10-CM | POA: Diagnosis not present

## 2022-09-26 DIAGNOSIS — E876 Hypokalemia: Secondary | ICD-10-CM | POA: Diagnosis not present

## 2022-09-26 DIAGNOSIS — J9601 Acute respiratory failure with hypoxia: Secondary | ICD-10-CM | POA: Diagnosis not present

## 2022-09-26 DIAGNOSIS — R531 Weakness: Secondary | ICD-10-CM | POA: Diagnosis not present

## 2022-09-26 DIAGNOSIS — U071 COVID-19: Secondary | ICD-10-CM | POA: Diagnosis not present

## 2022-09-27 DIAGNOSIS — U071 COVID-19: Secondary | ICD-10-CM | POA: Diagnosis not present

## 2022-09-27 DIAGNOSIS — J1282 Pneumonia due to coronavirus disease 2019: Secondary | ICD-10-CM | POA: Diagnosis not present

## 2022-09-27 DIAGNOSIS — J9601 Acute respiratory failure with hypoxia: Secondary | ICD-10-CM | POA: Diagnosis not present

## 2022-09-27 DIAGNOSIS — E876 Hypokalemia: Secondary | ICD-10-CM | POA: Diagnosis not present

## 2022-09-27 DIAGNOSIS — R531 Weakness: Secondary | ICD-10-CM | POA: Diagnosis not present

## 2022-09-28 DIAGNOSIS — J9601 Acute respiratory failure with hypoxia: Secondary | ICD-10-CM | POA: Diagnosis not present

## 2022-09-28 DIAGNOSIS — D72829 Elevated white blood cell count, unspecified: Secondary | ICD-10-CM | POA: Diagnosis not present

## 2022-09-28 DIAGNOSIS — U071 COVID-19: Secondary | ICD-10-CM | POA: Diagnosis not present

## 2022-09-28 DIAGNOSIS — J1282 Pneumonia due to coronavirus disease 2019: Secondary | ICD-10-CM | POA: Diagnosis not present

## 2022-09-29 DIAGNOSIS — U071 COVID-19: Secondary | ICD-10-CM | POA: Diagnosis not present

## 2022-09-29 DIAGNOSIS — E785 Hyperlipidemia, unspecified: Secondary | ICD-10-CM | POA: Diagnosis not present

## 2022-09-29 DIAGNOSIS — Z951 Presence of aortocoronary bypass graft: Secondary | ICD-10-CM | POA: Diagnosis not present

## 2022-09-29 DIAGNOSIS — I10 Essential (primary) hypertension: Secondary | ICD-10-CM | POA: Diagnosis not present

## 2022-09-29 DIAGNOSIS — I2722 Pulmonary hypertension due to left heart disease: Secondary | ICD-10-CM | POA: Diagnosis not present

## 2022-09-29 DIAGNOSIS — I088 Other rheumatic multiple valve diseases: Secondary | ICD-10-CM | POA: Diagnosis not present

## 2022-09-29 DIAGNOSIS — J1282 Pneumonia due to coronavirus disease 2019: Secondary | ICD-10-CM | POA: Diagnosis not present

## 2022-09-29 DIAGNOSIS — D72829 Elevated white blood cell count, unspecified: Secondary | ICD-10-CM | POA: Diagnosis not present

## 2022-09-29 DIAGNOSIS — J9601 Acute respiratory failure with hypoxia: Secondary | ICD-10-CM | POA: Diagnosis not present

## 2022-09-29 DIAGNOSIS — I2510 Atherosclerotic heart disease of native coronary artery without angina pectoris: Secondary | ICD-10-CM | POA: Diagnosis not present

## 2022-09-29 DIAGNOSIS — I5033 Acute on chronic diastolic (congestive) heart failure: Secondary | ICD-10-CM | POA: Diagnosis not present

## 2022-09-30 DIAGNOSIS — J1282 Pneumonia due to coronavirus disease 2019: Secondary | ICD-10-CM | POA: Diagnosis not present

## 2022-09-30 DIAGNOSIS — U071 COVID-19: Secondary | ICD-10-CM | POA: Diagnosis not present

## 2022-09-30 DIAGNOSIS — J9601 Acute respiratory failure with hypoxia: Secondary | ICD-10-CM | POA: Diagnosis not present

## 2022-09-30 DIAGNOSIS — D72829 Elevated white blood cell count, unspecified: Secondary | ICD-10-CM | POA: Diagnosis not present

## 2022-09-30 DIAGNOSIS — I5033 Acute on chronic diastolic (congestive) heart failure: Secondary | ICD-10-CM | POA: Diagnosis not present

## 2022-10-01 DIAGNOSIS — I10 Essential (primary) hypertension: Secondary | ICD-10-CM | POA: Diagnosis not present

## 2022-10-01 DIAGNOSIS — J9601 Acute respiratory failure with hypoxia: Secondary | ICD-10-CM | POA: Diagnosis not present

## 2022-10-01 DIAGNOSIS — U071 COVID-19: Secondary | ICD-10-CM | POA: Diagnosis not present

## 2022-10-01 DIAGNOSIS — J1282 Pneumonia due to coronavirus disease 2019: Secondary | ICD-10-CM | POA: Diagnosis not present

## 2022-10-02 DIAGNOSIS — J9601 Acute respiratory failure with hypoxia: Secondary | ICD-10-CM | POA: Diagnosis not present

## 2022-10-02 DIAGNOSIS — U071 COVID-19: Secondary | ICD-10-CM | POA: Diagnosis not present

## 2022-10-02 DIAGNOSIS — I10 Essential (primary) hypertension: Secondary | ICD-10-CM | POA: Diagnosis not present

## 2022-10-02 DIAGNOSIS — J1282 Pneumonia due to coronavirus disease 2019: Secondary | ICD-10-CM | POA: Diagnosis not present

## 2022-10-03 DIAGNOSIS — J9601 Acute respiratory failure with hypoxia: Secondary | ICD-10-CM | POA: Diagnosis not present

## 2022-10-03 DIAGNOSIS — I10 Essential (primary) hypertension: Secondary | ICD-10-CM | POA: Diagnosis not present

## 2022-10-03 DIAGNOSIS — U071 COVID-19: Secondary | ICD-10-CM | POA: Diagnosis not present

## 2022-10-03 DIAGNOSIS — J1282 Pneumonia due to coronavirus disease 2019: Secondary | ICD-10-CM | POA: Diagnosis not present

## 2022-10-03 DIAGNOSIS — I5032 Chronic diastolic (congestive) heart failure: Secondary | ICD-10-CM | POA: Diagnosis not present

## 2022-10-04 DIAGNOSIS — J9601 Acute respiratory failure with hypoxia: Secondary | ICD-10-CM | POA: Diagnosis not present

## 2022-10-04 DIAGNOSIS — I10 Essential (primary) hypertension: Secondary | ICD-10-CM | POA: Diagnosis not present

## 2022-10-04 DIAGNOSIS — I5032 Chronic diastolic (congestive) heart failure: Secondary | ICD-10-CM | POA: Diagnosis not present

## 2022-10-04 DIAGNOSIS — J1282 Pneumonia due to coronavirus disease 2019: Secondary | ICD-10-CM | POA: Diagnosis not present

## 2022-10-04 DIAGNOSIS — U071 COVID-19: Secondary | ICD-10-CM | POA: Diagnosis not present

## 2022-10-05 DIAGNOSIS — I088 Other rheumatic multiple valve diseases: Secondary | ICD-10-CM | POA: Diagnosis not present

## 2022-10-05 DIAGNOSIS — U071 COVID-19: Secondary | ICD-10-CM | POA: Diagnosis not present

## 2022-10-05 DIAGNOSIS — I2722 Pulmonary hypertension due to left heart disease: Secondary | ICD-10-CM | POA: Diagnosis not present

## 2022-10-05 DIAGNOSIS — J1282 Pneumonia due to coronavirus disease 2019: Secondary | ICD-10-CM | POA: Diagnosis not present

## 2022-10-05 DIAGNOSIS — I10 Essential (primary) hypertension: Secondary | ICD-10-CM | POA: Diagnosis not present

## 2022-10-05 DIAGNOSIS — I2510 Atherosclerotic heart disease of native coronary artery without angina pectoris: Secondary | ICD-10-CM | POA: Diagnosis not present

## 2022-10-05 DIAGNOSIS — Z951 Presence of aortocoronary bypass graft: Secondary | ICD-10-CM | POA: Diagnosis not present

## 2022-10-05 DIAGNOSIS — E785 Hyperlipidemia, unspecified: Secondary | ICD-10-CM | POA: Diagnosis not present

## 2022-10-05 DIAGNOSIS — J9601 Acute respiratory failure with hypoxia: Secondary | ICD-10-CM | POA: Diagnosis not present

## 2022-10-06 DIAGNOSIS — J9601 Acute respiratory failure with hypoxia: Secondary | ICD-10-CM | POA: Diagnosis not present

## 2022-10-06 DIAGNOSIS — I10 Essential (primary) hypertension: Secondary | ICD-10-CM | POA: Diagnosis not present

## 2022-10-06 DIAGNOSIS — U071 COVID-19: Secondary | ICD-10-CM | POA: Diagnosis not present

## 2022-10-06 DIAGNOSIS — J1282 Pneumonia due to coronavirus disease 2019: Secondary | ICD-10-CM | POA: Diagnosis not present

## 2022-10-07 DIAGNOSIS — J1282 Pneumonia due to coronavirus disease 2019: Secondary | ICD-10-CM | POA: Diagnosis not present

## 2022-10-07 DIAGNOSIS — J9601 Acute respiratory failure with hypoxia: Secondary | ICD-10-CM | POA: Diagnosis not present

## 2022-10-07 DIAGNOSIS — I10 Essential (primary) hypertension: Secondary | ICD-10-CM | POA: Diagnosis not present

## 2022-10-07 DIAGNOSIS — U071 COVID-19: Secondary | ICD-10-CM | POA: Diagnosis not present

## 2022-10-08 DIAGNOSIS — I10 Essential (primary) hypertension: Secondary | ICD-10-CM | POA: Diagnosis not present

## 2022-10-08 DIAGNOSIS — U071 COVID-19: Secondary | ICD-10-CM | POA: Diagnosis not present

## 2022-10-08 DIAGNOSIS — J9601 Acute respiratory failure with hypoxia: Secondary | ICD-10-CM | POA: Diagnosis not present

## 2022-10-08 DIAGNOSIS — J1282 Pneumonia due to coronavirus disease 2019: Secondary | ICD-10-CM | POA: Diagnosis not present

## 2022-10-09 DIAGNOSIS — U071 COVID-19: Secondary | ICD-10-CM | POA: Diagnosis not present

## 2022-10-09 DIAGNOSIS — J9601 Acute respiratory failure with hypoxia: Secondary | ICD-10-CM | POA: Diagnosis not present

## 2022-10-09 DIAGNOSIS — J1282 Pneumonia due to coronavirus disease 2019: Secondary | ICD-10-CM | POA: Diagnosis not present

## 2022-10-09 DIAGNOSIS — I10 Essential (primary) hypertension: Secondary | ICD-10-CM | POA: Diagnosis not present

## 2022-10-10 DIAGNOSIS — I5032 Chronic diastolic (congestive) heart failure: Secondary | ICD-10-CM | POA: Diagnosis not present

## 2022-10-10 DIAGNOSIS — U071 COVID-19: Secondary | ICD-10-CM | POA: Diagnosis not present

## 2022-10-10 DIAGNOSIS — J9601 Acute respiratory failure with hypoxia: Secondary | ICD-10-CM | POA: Diagnosis not present

## 2022-10-10 DIAGNOSIS — I10 Essential (primary) hypertension: Secondary | ICD-10-CM | POA: Diagnosis not present

## 2022-10-10 DIAGNOSIS — J1282 Pneumonia due to coronavirus disease 2019: Secondary | ICD-10-CM | POA: Diagnosis not present

## 2022-10-11 DIAGNOSIS — I5032 Chronic diastolic (congestive) heart failure: Secondary | ICD-10-CM | POA: Diagnosis not present

## 2022-10-11 DIAGNOSIS — J1282 Pneumonia due to coronavirus disease 2019: Secondary | ICD-10-CM | POA: Diagnosis not present

## 2022-10-11 DIAGNOSIS — I10 Essential (primary) hypertension: Secondary | ICD-10-CM | POA: Diagnosis not present

## 2022-10-11 DIAGNOSIS — U071 COVID-19: Secondary | ICD-10-CM | POA: Diagnosis not present

## 2022-10-11 DIAGNOSIS — J9601 Acute respiratory failure with hypoxia: Secondary | ICD-10-CM | POA: Diagnosis not present

## 2022-10-12 DIAGNOSIS — R5381 Other malaise: Secondary | ICD-10-CM | POA: Diagnosis not present

## 2022-10-12 DIAGNOSIS — L89313 Pressure ulcer of right buttock, stage 3: Secondary | ICD-10-CM | POA: Diagnosis not present

## 2022-10-12 DIAGNOSIS — E7841 Elevated Lipoprotein(a): Secondary | ICD-10-CM | POA: Diagnosis not present

## 2022-10-12 DIAGNOSIS — R319 Hematuria, unspecified: Secondary | ICD-10-CM | POA: Diagnosis not present

## 2022-10-12 DIAGNOSIS — U071 COVID-19: Secondary | ICD-10-CM | POA: Diagnosis not present

## 2022-10-12 DIAGNOSIS — E46 Unspecified protein-calorie malnutrition: Secondary | ICD-10-CM | POA: Diagnosis not present

## 2022-10-12 DIAGNOSIS — J9621 Acute and chronic respiratory failure with hypoxia: Secondary | ICD-10-CM | POA: Diagnosis not present

## 2022-10-12 DIAGNOSIS — K219 Gastro-esophageal reflux disease without esophagitis: Secondary | ICD-10-CM | POA: Diagnosis not present

## 2022-10-12 DIAGNOSIS — E039 Hypothyroidism, unspecified: Secondary | ICD-10-CM | POA: Diagnosis not present

## 2022-10-12 DIAGNOSIS — R41841 Cognitive communication deficit: Secondary | ICD-10-CM | POA: Diagnosis not present

## 2022-10-12 DIAGNOSIS — J1282 Pneumonia due to coronavirus disease 2019: Secondary | ICD-10-CM | POA: Diagnosis not present

## 2022-10-12 DIAGNOSIS — N3946 Mixed incontinence: Secondary | ICD-10-CM | POA: Diagnosis not present

## 2022-10-12 DIAGNOSIS — R1310 Dysphagia, unspecified: Secondary | ICD-10-CM | POA: Diagnosis not present

## 2022-10-12 DIAGNOSIS — I209 Angina pectoris, unspecified: Secondary | ICD-10-CM | POA: Diagnosis not present

## 2022-10-12 DIAGNOSIS — J129 Viral pneumonia, unspecified: Secondary | ICD-10-CM | POA: Diagnosis not present

## 2022-10-12 DIAGNOSIS — M6281 Muscle weakness (generalized): Secondary | ICD-10-CM | POA: Diagnosis not present

## 2022-10-12 DIAGNOSIS — E876 Hypokalemia: Secondary | ICD-10-CM | POA: Diagnosis not present

## 2022-10-12 DIAGNOSIS — E785 Hyperlipidemia, unspecified: Secondary | ICD-10-CM | POA: Diagnosis not present

## 2022-10-12 DIAGNOSIS — I5030 Unspecified diastolic (congestive) heart failure: Secondary | ICD-10-CM | POA: Diagnosis not present

## 2022-10-12 DIAGNOSIS — F339 Major depressive disorder, recurrent, unspecified: Secondary | ICD-10-CM | POA: Diagnosis not present

## 2022-10-12 DIAGNOSIS — J9601 Acute respiratory failure with hypoxia: Secondary | ICD-10-CM | POA: Diagnosis not present

## 2022-10-12 DIAGNOSIS — N281 Cyst of kidney, acquired: Secondary | ICD-10-CM | POA: Diagnosis not present

## 2022-10-12 DIAGNOSIS — I2510 Atherosclerotic heart disease of native coronary artery without angina pectoris: Secondary | ICD-10-CM | POA: Diagnosis not present

## 2022-10-12 DIAGNOSIS — N3001 Acute cystitis with hematuria: Secondary | ICD-10-CM | POA: Diagnosis not present

## 2022-10-12 DIAGNOSIS — R109 Unspecified abdominal pain: Secondary | ICD-10-CM | POA: Diagnosis not present

## 2022-10-12 DIAGNOSIS — R7611 Nonspecific reaction to tuberculin skin test without active tuberculosis: Secondary | ICD-10-CM | POA: Diagnosis not present

## 2022-10-12 DIAGNOSIS — K5901 Slow transit constipation: Secondary | ICD-10-CM | POA: Diagnosis not present

## 2022-10-12 DIAGNOSIS — L89153 Pressure ulcer of sacral region, stage 3: Secondary | ICD-10-CM | POA: Diagnosis not present

## 2022-10-12 DIAGNOSIS — R627 Adult failure to thrive: Secondary | ICD-10-CM | POA: Diagnosis not present

## 2022-10-12 DIAGNOSIS — E441 Mild protein-calorie malnutrition: Secondary | ICD-10-CM | POA: Diagnosis not present

## 2022-10-12 DIAGNOSIS — N390 Urinary tract infection, site not specified: Secondary | ICD-10-CM | POA: Diagnosis not present

## 2022-10-12 DIAGNOSIS — R82998 Other abnormal findings in urine: Secondary | ICD-10-CM | POA: Diagnosis not present

## 2022-10-12 DIAGNOSIS — I5020 Unspecified systolic (congestive) heart failure: Secondary | ICD-10-CM | POA: Diagnosis not present

## 2022-10-12 DIAGNOSIS — I10 Essential (primary) hypertension: Secondary | ICD-10-CM | POA: Diagnosis not present

## 2022-10-12 DIAGNOSIS — Z79899 Other long term (current) drug therapy: Secondary | ICD-10-CM | POA: Diagnosis not present

## 2022-10-12 DIAGNOSIS — E860 Dehydration: Secondary | ICD-10-CM | POA: Diagnosis not present

## 2022-10-12 DIAGNOSIS — Z7982 Long term (current) use of aspirin: Secondary | ICD-10-CM | POA: Diagnosis not present

## 2022-10-12 DIAGNOSIS — F331 Major depressive disorder, recurrent, moderate: Secondary | ICD-10-CM | POA: Diagnosis not present

## 2022-10-12 DIAGNOSIS — I5033 Acute on chronic diastolic (congestive) heart failure: Secondary | ICD-10-CM | POA: Diagnosis not present

## 2022-10-13 DIAGNOSIS — F331 Major depressive disorder, recurrent, moderate: Secondary | ICD-10-CM | POA: Diagnosis not present

## 2022-10-15 DIAGNOSIS — E441 Mild protein-calorie malnutrition: Secondary | ICD-10-CM | POA: Diagnosis not present

## 2022-10-15 DIAGNOSIS — E785 Hyperlipidemia, unspecified: Secondary | ICD-10-CM | POA: Diagnosis not present

## 2022-10-15 DIAGNOSIS — U071 COVID-19: Secondary | ICD-10-CM | POA: Diagnosis not present

## 2022-10-15 DIAGNOSIS — I5020 Unspecified systolic (congestive) heart failure: Secondary | ICD-10-CM | POA: Diagnosis not present

## 2022-10-15 DIAGNOSIS — R5381 Other malaise: Secondary | ICD-10-CM | POA: Diagnosis not present

## 2022-10-15 DIAGNOSIS — R627 Adult failure to thrive: Secondary | ICD-10-CM | POA: Diagnosis not present

## 2022-10-15 DIAGNOSIS — L89153 Pressure ulcer of sacral region, stage 3: Secondary | ICD-10-CM | POA: Diagnosis not present

## 2022-10-15 DIAGNOSIS — I2510 Atherosclerotic heart disease of native coronary artery without angina pectoris: Secondary | ICD-10-CM | POA: Diagnosis not present

## 2022-10-15 DIAGNOSIS — M6281 Muscle weakness (generalized): Secondary | ICD-10-CM | POA: Diagnosis not present

## 2022-10-15 DIAGNOSIS — F339 Major depressive disorder, recurrent, unspecified: Secondary | ICD-10-CM | POA: Diagnosis not present

## 2022-10-15 DIAGNOSIS — N3946 Mixed incontinence: Secondary | ICD-10-CM | POA: Diagnosis not present

## 2022-10-15 DIAGNOSIS — I10 Essential (primary) hypertension: Secondary | ICD-10-CM | POA: Diagnosis not present

## 2022-10-19 DIAGNOSIS — R7611 Nonspecific reaction to tuberculin skin test without active tuberculosis: Secondary | ICD-10-CM | POA: Diagnosis not present

## 2022-10-21 DIAGNOSIS — N3946 Mixed incontinence: Secondary | ICD-10-CM | POA: Diagnosis not present

## 2022-10-21 DIAGNOSIS — R627 Adult failure to thrive: Secondary | ICD-10-CM | POA: Diagnosis not present

## 2022-10-21 DIAGNOSIS — M6281 Muscle weakness (generalized): Secondary | ICD-10-CM | POA: Diagnosis not present

## 2022-10-21 DIAGNOSIS — L89153 Pressure ulcer of sacral region, stage 3: Secondary | ICD-10-CM | POA: Diagnosis not present

## 2022-10-21 DIAGNOSIS — F339 Major depressive disorder, recurrent, unspecified: Secondary | ICD-10-CM | POA: Diagnosis not present

## 2022-10-21 DIAGNOSIS — E441 Mild protein-calorie malnutrition: Secondary | ICD-10-CM | POA: Diagnosis not present

## 2022-10-21 DIAGNOSIS — I5020 Unspecified systolic (congestive) heart failure: Secondary | ICD-10-CM | POA: Diagnosis not present

## 2022-10-26 DIAGNOSIS — R627 Adult failure to thrive: Secondary | ICD-10-CM | POA: Diagnosis not present

## 2022-10-26 DIAGNOSIS — M6281 Muscle weakness (generalized): Secondary | ICD-10-CM | POA: Diagnosis not present

## 2022-10-26 DIAGNOSIS — N3946 Mixed incontinence: Secondary | ICD-10-CM | POA: Diagnosis not present

## 2022-10-26 DIAGNOSIS — I5020 Unspecified systolic (congestive) heart failure: Secondary | ICD-10-CM | POA: Diagnosis not present

## 2022-10-26 DIAGNOSIS — E441 Mild protein-calorie malnutrition: Secondary | ICD-10-CM | POA: Diagnosis not present

## 2022-10-26 DIAGNOSIS — L89313 Pressure ulcer of right buttock, stage 3: Secondary | ICD-10-CM | POA: Diagnosis not present

## 2022-10-26 DIAGNOSIS — L89153 Pressure ulcer of sacral region, stage 3: Secondary | ICD-10-CM | POA: Diagnosis not present

## 2022-10-26 DIAGNOSIS — F339 Major depressive disorder, recurrent, unspecified: Secondary | ICD-10-CM | POA: Diagnosis not present

## 2022-10-28 DIAGNOSIS — R627 Adult failure to thrive: Secondary | ICD-10-CM | POA: Diagnosis not present

## 2022-10-29 DIAGNOSIS — N390 Urinary tract infection, site not specified: Secondary | ICD-10-CM | POA: Diagnosis not present

## 2022-10-30 DIAGNOSIS — I10 Essential (primary) hypertension: Secondary | ICD-10-CM | POA: Diagnosis not present

## 2022-10-30 DIAGNOSIS — E876 Hypokalemia: Secondary | ICD-10-CM | POA: Diagnosis not present

## 2022-10-30 DIAGNOSIS — N390 Urinary tract infection, site not specified: Secondary | ICD-10-CM | POA: Diagnosis not present

## 2022-10-30 DIAGNOSIS — E785 Hyperlipidemia, unspecified: Secondary | ICD-10-CM | POA: Diagnosis not present

## 2022-10-30 DIAGNOSIS — I209 Angina pectoris, unspecified: Secondary | ICD-10-CM | POA: Diagnosis not present

## 2022-10-31 ENCOUNTER — Other Ambulatory Visit: Payer: Self-pay

## 2022-10-31 ENCOUNTER — Emergency Department (HOSPITAL_COMMUNITY)
Admission: EM | Admit: 2022-10-31 | Discharge: 2022-10-31 | Disposition: A | Discharge status: Home / Self Care | Payer: PPO

## 2022-10-31 ENCOUNTER — Emergency Department (HOSPITAL_COMMUNITY): Payer: PPO

## 2022-10-31 ENCOUNTER — Encounter (HOSPITAL_COMMUNITY): Payer: Self-pay

## 2022-10-31 DIAGNOSIS — E860 Dehydration: Secondary | ICD-10-CM | POA: Insufficient documentation

## 2022-10-31 DIAGNOSIS — N3001 Acute cystitis with hematuria: Secondary | ICD-10-CM | POA: Insufficient documentation

## 2022-10-31 DIAGNOSIS — E876 Hypokalemia: Secondary | ICD-10-CM | POA: Diagnosis not present

## 2022-10-31 DIAGNOSIS — Z79899 Other long term (current) drug therapy: Secondary | ICD-10-CM | POA: Insufficient documentation

## 2022-10-31 DIAGNOSIS — R109 Unspecified abdominal pain: Secondary | ICD-10-CM | POA: Diagnosis not present

## 2022-10-31 DIAGNOSIS — R319 Hematuria, unspecified: Secondary | ICD-10-CM | POA: Diagnosis not present

## 2022-10-31 DIAGNOSIS — I2510 Atherosclerotic heart disease of native coronary artery without angina pectoris: Secondary | ICD-10-CM | POA: Diagnosis not present

## 2022-10-31 DIAGNOSIS — Z7982 Long term (current) use of aspirin: Secondary | ICD-10-CM | POA: Insufficient documentation

## 2022-10-31 DIAGNOSIS — R82998 Other abnormal findings in urine: Secondary | ICD-10-CM | POA: Diagnosis not present

## 2022-10-31 DIAGNOSIS — N281 Cyst of kidney, acquired: Secondary | ICD-10-CM | POA: Diagnosis not present

## 2022-10-31 DIAGNOSIS — K5901 Slow transit constipation: Secondary | ICD-10-CM | POA: Diagnosis not present

## 2022-10-31 LAB — CBC WITH DIFFERENTIAL/PLATELET
Abs Immature Granulocytes: 0.13 10*3/uL — ABNORMAL HIGH (ref 0.00–0.07)
Basophils Absolute: 0.1 10*3/uL (ref 0.0–0.1)
Basophils Relative: 1 %
Eosinophils Absolute: 0.1 10*3/uL (ref 0.0–0.5)
Eosinophils Relative: 1 %
HCT: 38.5 % — ABNORMAL LOW (ref 39.0–52.0)
Hemoglobin: 12.5 g/dL — ABNORMAL LOW (ref 13.0–17.0)
Immature Granulocytes: 2 %
Lymphocytes Relative: 31 %
Lymphs Abs: 2.7 10*3/uL (ref 0.7–4.0)
MCH: 30.5 pg (ref 26.0–34.0)
MCHC: 32.5 g/dL (ref 30.0–36.0)
MCV: 93.9 fL (ref 80.0–100.0)
Monocytes Absolute: 0.8 10*3/uL (ref 0.1–1.0)
Monocytes Relative: 10 %
Neutro Abs: 4.9 10*3/uL (ref 1.7–7.7)
Neutrophils Relative %: 55 %
Platelets: 295 10*3/uL (ref 150–400)
RBC: 4.1 MIL/uL — ABNORMAL LOW (ref 4.22–5.81)
RDW: 15.7 % — ABNORMAL HIGH (ref 11.5–15.5)
WBC: 8.7 10*3/uL (ref 4.0–10.5)
nRBC: 0 % (ref 0.0–0.2)

## 2022-10-31 LAB — COMPREHENSIVE METABOLIC PANEL
ALT: 33 U/L (ref 0–44)
AST: 35 U/L (ref 15–41)
Albumin: 2.3 g/dL — ABNORMAL LOW (ref 3.5–5.0)
Alkaline Phosphatase: 59 U/L (ref 38–126)
Anion gap: 12 (ref 5–15)
BUN: 20 mg/dL (ref 8–23)
CO2: 24 mmol/L (ref 22–32)
Calcium: 8.3 mg/dL — ABNORMAL LOW (ref 8.9–10.3)
Chloride: 100 mmol/L (ref 98–111)
Creatinine, Ser: 1.14 mg/dL (ref 0.61–1.24)
GFR, Estimated: >60 mL/min (ref >60)
Glucose, Bld: 131 mg/dL — ABNORMAL HIGH (ref 70–99)
Potassium: 3.1 mmol/L — ABNORMAL LOW (ref 3.5–5.1)
Sodium: 136 mmol/L (ref 135–145)
Total Bilirubin: 1.3 mg/dL — ABNORMAL HIGH (ref 0.3–1.2)
Total Protein: 6 g/dL — ABNORMAL LOW (ref 6.5–8.1)

## 2022-10-31 LAB — URINALYSIS, ROUTINE W REFLEX MICROSCOPIC
Bilirubin Urine: NEGATIVE
Glucose, UA: NEGATIVE mg/dL
Hgb urine dipstick: NEGATIVE
Ketones, ur: NEGATIVE mg/dL
Leukocytes,Ua: NEGATIVE
Nitrite: NEGATIVE
Protein, ur: 30 mg/dL — AB
Specific Gravity, Urine: 1.045 — ABNORMAL HIGH (ref 1.005–1.030)
pH: 5 (ref 5.0–8.0)

## 2022-10-31 LAB — PROTIME-INR
INR: 1.2 (ref 0.8–1.2)
Prothrombin Time: 15.8 seconds — ABNORMAL HIGH (ref 11.4–15.2)

## 2022-10-31 LAB — LIPASE, BLOOD: Lipase: 56 U/L — ABNORMAL HIGH (ref 11–51)

## 2022-10-31 LAB — CK: Total CK: 54 U/L (ref 49–397)

## 2022-10-31 LAB — APTT: aPTT: 29 seconds (ref 24–36)

## 2022-10-31 MED ORDER — CIPROFLOXACIN HCL 500 MG PO TABS: 500.0000 mg | ORAL_TABLET | Freq: Two times a day (BID) | ORAL | 0 refills | Status: AC

## 2022-10-31 MED ORDER — SODIUM CHLORIDE 0.9 % IV BOLUS
1000.0000 mL | Freq: Once | INTRAVENOUS | Status: AC
  Administered 2022-10-31: 1000 mL via INTRAVENOUS

## 2022-10-31 MED ORDER — IOHEXOL 350 MG/ML SOLN
75.0000 mL | Freq: Once | INTRAVENOUS | Status: AC | PRN
  Administered 2022-10-31: 75 mL via INTRAVENOUS

## 2022-10-31 MED ORDER — MAGNESIUM OXIDE -MG SUPPLEMENT 400 (240 MG) MG PO TABS
400.0000 mg | ORAL_TABLET | Freq: Once | ORAL | Status: AC
  Administered 2022-10-31: 400 mg via ORAL
  Filled 2022-10-31: qty 1

## 2022-10-31 MED ORDER — POTASSIUM CHLORIDE CRYS ER 20 MEQ PO TBCR: 20.0000 meq | EXTENDED_RELEASE_TABLET | Freq: Every day | ORAL | 0 refills | Status: DC

## 2022-10-31 MED ORDER — POTASSIUM CHLORIDE CRYS ER 20 MEQ PO TBCR
40.0000 meq | EXTENDED_RELEASE_TABLET | Freq: Once | ORAL | Status: AC
  Administered 2022-10-31: 40 meq via ORAL
  Filled 2022-10-31: qty 2

## 2022-10-31 MED ORDER — SODIUM CHLORIDE 0.9 % IV SOLN
1.0000 g | Freq: Once | INTRAVENOUS | Status: AC
  Administered 2022-10-31: 1 g via INTRAVENOUS
  Filled 2022-10-31: qty 10

## 2022-10-31 MED ORDER — ONDANSETRON HCL 4 MG PO TABS: 4.0000 mg | ORAL_TABLET | Freq: Three times a day (TID) | ORAL | 0 refills | Status: AC | PRN

## 2022-10-31 NOTE — Care Management (Signed)
Transition of Care [facility]) - Emergency Department Mini Assessment   Patient Details  Name: Bradley Oconnor MRN: [id number] Date of Birth: 03/03/1941  [facility] CM/SW Contact:    Lockie Pares, RN Phone Number: 10/31/2022, 5:02 PM   Clinical Narrative:  Patient states he has been asking to go to the ED for symptoms. Wants to change SNF. Called him, brother at bedside. Instructed them that we cannot change, the SNF has to do this, encouraged them to speak to the Director about their concerns.  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 [place] [address] Phone: [phone] Fax: [phone]

## 2022-10-31 NOTE — ED Notes (Signed)
[facility] contacted fran was given report and accepted the pt

## 2022-10-31 NOTE — Discharge Instructions (Addendum)
Thank you for letting us take care of you today.  Overall, your workup was reassuring. Your CT scan did not show any major issues with your kidneys or elsewhere in your abdomen. You did have some mild constipation. I recommend increasing fiber intake and hydration and using Miralax or other over the counter medications to help with bowel movements as needed.   Your potassium was slightly low. We gave you a tablet to replace this. I am sending you home on potassium for 5 days to help supplement. Have this level rechecked by your PCP.  We gave you fluids to help with dehydration. Be sure you are maintaining good nutrition and hydration. I prescribed nausea medication you may take as needed. If you continue to have issues with appetite, discuss with your PCP as there are medications that can be prescribed to help with appetite. I recommend doing small, frequent meals and drinking plenty of water or electrolyte replacement solutions such as Gatorade or Powerade.   Your urine did appear infected and had some red blood cells in it. We are treating you for a UTI. We gave you a dose of IV antibiotics in the ED. We are prescribing antibiotics for you to start tomorrow. With your symptoms, I recommend following up with a urologist for recheck. I provided information for our on call urologist today or you may follow up with a urologist of your own choosing. For new or worsening symptoms, return to nearest ED for re-evaluation.   As discussed with social work, please discuss your concerns with your treatment with the director of the nursing facility on Monday as well as any request to be placed elsewhere.  For any difficulty with completing this task, please promptly contact your PCP for further guidance.

## 2022-10-31 NOTE — ED Provider Notes (Signed)
[facility] AT [facility] Provider Note   CSN: [id number] Arrival date & time: 10/31/22  1237     History  Chief Complaint  Patient presents with   Hematuria    Bradley Oconnor is a 82 y.o. male with past medical history anemia, CAD, GERD, hyperlipidemia, thyroid disease who presents to the ED complaining of dark brown urine for the last 5 to 6 days.  He denies associated pain.  He denies fever, nausea, vomiting, or diarrhea.  States that he he has had a lack of appetite for the last 6 weeks and is unsure why.  No fever, chest pain, shortness of breath, dysuria, or other acute complaints today.  States that his urine is dark every time he urinates.  He takes a baby aspirin daily but no other anticoagulants.  No recent falls or trauma.  No change in activity level.  Last urinated around 7 AM this morning.  No history of kidney or prostate problems.      Home Medications Prior to Admission medications   Medication Sig Start Date End Date Taking? Authorizing Provider  ciprofloxacin (CIPRO) 500 MG tablet Take 1 tablet (500 mg total) by mouth every 12 (twelve) hours for 10 days. 11/01/22 11/11/22 Yes ,  L, PA-C  ondansetron (ZOFRAN) 4 MG tablet Take 1 tablet (4 mg total) by mouth every 8 (eight) hours as needed for up to 10 days for nausea or vomiting. 10/31/22 11/10/22 Yes ,  L, PA-C  potassium chloride SA (KLOR-CON M) 20 MEQ tablet Take 1 tablet (20 mEq total) by mouth daily for 5 days. 10/31/22 11/05/22 Yes ,  L, PA-C  amLODipine (NORVASC) 5 MG tablet Take 1 tablet (5 mg total) by mouth daily. 05/29/22   Lennette Bihari, MD  aspirin EC 81 MG tablet Take 1 tablet (81 mg total) by mouth daily. 04/06/16   Lennette Bihari, MD  dextromethorphan-guaiFENesin [facility] DM) 30-600 MG 12hr tablet Take 1 tablet by mouth 2 (two) times daily. 04/11/21   Janeece Agee, NP  esomeprazole (NEXIUM) 20 MG capsule Take 20 mg by mouth daily at 12 noon.     [provider]  fish oil-omega-3 fatty acids 1000 MG capsule Take 1 g by mouth daily.     [provider]  fluticasone (FLONASE) 50 MCG/ACT nasal spray Place 2 sprays into both nostrils daily. 04/11/21   Janeece Agee, NP  Garlic 1000 MG CAPS Take 1 capsule by mouth daily.     [provider]  hydrochlorothiazide (MICROZIDE) 12.5 MG capsule TAKE 1 CAPSULE BY MOUTH ONCE DAILY . APPOINTMENT REQUIRED FOR FUTURE REFILLS 09/01/22   Lennette Bihari, MD  isosorbide mononitrate (IMDUR) 120 MG 24 hr tablet Take 1 tablet by mouth once daily 09/01/22   Lennette Bihari, MD  lansoprazole (PREVACID) 15 MG capsule Take 15 mg by mouth daily at 12 noon.    [provider]  levothyroxine (SYNTHROID) 100 MCG tablet TAKE 1 TABLET BY MOUTH ONCE DAILY IN THE MORNING -  NEED  OFFICE  VISIT  WITH  DR.  Tresa Endo  FOR  FUTURE  REFILLS 06/29/22   Lennette Bihari, MD  metoprolol succinate (TOPROL-XL) 100 MG 24 hr tablet TAKE 1 TABLET BY MOUTH ONCE DAILY . APPOINTMENT REQUIRED FOR FUTURE REFILLS 09/01/22   Lennette Bihari, MD  nitroGLYCERIN (NITROSTAT) 0.4 MG SL tablet DISSOLVE ONE TABLET UNDER THE TONGUE EVERY 5 MINUTES AS NEEDED FOR CHEST PAIN.  DO NOT EXCEED A  TOTAL OF 3 DOSES IN 15 MINUTES 06/26/21   Lennette Bihari, MD  ramipril (ALTACE) 10 MG capsule Take 1 capsule (10 mg total) by mouth daily. 11/17/21   Lennette Bihari, MD  ranolazine (RANEXA) 1000 MG SR tablet TAKE 1  BY MOUTH TWICE DAILY 09/01/22   Lennette Bihari, MD  rosuvastatin (CRESTOR) 20 MG tablet TAKE 1 TABLET BY MOUTH ONCE DAILY IN THE EVENING 07/15/22   Lennette Bihari, MD      Allergies    Procardia [nifedipine] and Phenergan [promethazine hcl]    Review of Systems   Review of Systems  All other systems reviewed and are negative.   Physical Exam Updated Vital Signs BP (!) 144/127   Pulse 76   Temp 98.2 F (36.8 C) (Oral)   Resp (!) 22   SpO2 100%  Physical Exam Vitals and nursing note reviewed.  Constitutional:       General: He is not in acute distress.    Appearance: Normal appearance. He is not ill-appearing, toxic-appearing or diaphoretic.     Comments: Frail appearing, elderly male lying in bed  HENT:     Head: Normocephalic and atraumatic.     Mouth/Throat:     Mouth: Mucous membranes are dry.  Eyes:     Conjunctiva/sclera: Conjunctivae normal.  Cardiovascular:     Rate and Rhythm: Normal rate and regular rhythm.     Heart sounds: No murmur heard. Pulmonary:     Effort: Pulmonary effort is normal.     Breath sounds: Normal breath sounds.  Abdominal:     General: Abdomen is flat. There is no distension.     Palpations: Abdomen is soft.     Tenderness: There is no abdominal tenderness. There is no right CVA tenderness, left CVA tenderness, guarding or rebound.  Musculoskeletal:        General: Normal range of motion.     Cervical back: Neck supple.     Right lower leg: No edema.     Left lower leg: No edema.  Skin:    General: Skin is warm and dry.     Capillary Refill: Capillary refill takes less than 2 seconds.  Neurological:     Mental Status: He is alert. Mental status is at baseline.  Psychiatric:        Mood and Affect: Mood normal.        Behavior: Behavior normal.     ED Results / Procedures / Treatments   Labs (all labs ordered are listed, but only abnormal results are displayed) Labs Reviewed  CBC WITH DIFFERENTIAL/PLATELET - Abnormal; Notable for the following components:      Result Value   RBC 4.10 (*)    Hemoglobin 12.5 (*)    HCT 38.5 (*)    RDW 15.7 (*)    Abs Immature Granulocytes 0.13 (*)    All other components within normal limits  COMPREHENSIVE METABOLIC PANEL - Abnormal; Notable for the following components:   Potassium 3.1 (*)    Glucose, Bld 131 (*)    Calcium 8.3 (*)    Total Protein 6.0 (*)    Albumin 2.3 (*)    Total Bilirubin 1.3 (*)    All other components within normal limits  LIPASE, BLOOD - Abnormal; Notable for the following components:    Lipase 56 (*)    All other components within normal limits  URINALYSIS, ROUTINE W REFLEX MICROSCOPIC - Abnormal; Notable for the following components:   Color, Urine  AMBER (*)    APPearance CLOUDY (*)    Specific Gravity, Urine 1.045 (*)    Protein, ur 30 (*)    Bacteria, UA FEW (*)    All other components within normal limits  PROTIME-INR - Abnormal; Notable for the following components:   Prothrombin Time 15.8 (*)    All other components within normal limits  APTT  CK    EKG None  Radiology CT ABDOMEN PELVIS W CONTRAST  Result Date: 10/31/2022 CLINICAL DATA:  Abdominal pain, acute, nonlocalized. Hematuria. Dark urine. Anorexia. EXAM: CT ABDOMEN AND PELVIS WITH CONTRAST TECHNIQUE: Multidetector CT imaging of the abdomen and pelvis was performed using the standard protocol following bolus administration of intravenous contrast. RADIATION DOSE REDUCTION: This exam was performed according to the departmental dose-optimization program which includes automated exposure control, adjustment of the mA and/or kV according to patient size and/or use of iterative reconstruction technique. CONTRAST:  75mL OMNIPAQUE IOHEXOL 350 MG/ML SOLN COMPARISON:  03/31/2013 FINDINGS: Lower chest: Mild atelectasis and possibly scarring at the lung bases. Hepatobiliary: Liver parenchyma is normal. Layering sludge or tiny stones in the gallbladder. No CT evidence of cholecystitis or obstruction. No hyperdense material seen in the common duct. Pancreas: Normal Spleen: Normal Adrenals/Urinary Tract: Adrenal glands are normal. Right kidney is normal. No cyst, mass, stone or hydronephrosis. Left kidney contains 2 simple appearing cysts that do not require further follow-up. The largest is in the lower pole measuring 4.6 cm in diameter. No stone or hydronephrosis. The bladder appears normal by CT. Stomach/Bowel: Stomach and small intestine are normal. Previous appendectomy. Hyperdense material throughout the colon  presumably related to either previously administered contrast or ingestion of hyperdense material. No sign of bowel obstruction or inflammation. Vascular/Lymphatic: Aortic atherosclerosis. No significant change since the previous study. Maximal diameter of the infrarenal aorta measures 2.6 cm. IVC is normal. No adenopathy. Reproductive: Normal Other: No free fluid or air. Musculoskeletal: Ordinary mild chronic degenerative changes of the spine. IMPRESSION: 1. Layering sludge or tiny stones in the gallbladder. No CT evidence of cholecystitis or obstruction. 2. No abnormality seen to specifically explain the clinical presentation. Urinary tract appears unremarkable. 3. Aortic atherosclerosis. Maximal diameter of the infrarenal aorta measures 2.6 cm. 4. Hyperdense material throughout the colon presumably related to either previously administered oral contrast or ingestion of other hyperdense material. No sign of bowel obstruction or inflammation. This could be an indication of constipation. Aortic Atherosclerosis (ICD10-I70.0). Electronically Signed   By: Paulina Fusi M.D.   On: 10/31/2022 14:32    Procedures Procedures  Bladder scan negative for retention  Medications Ordered in ED Medications  sodium chloride 0.9 % bolus 1,000 mL (0 mLs Intravenous Stopped 10/31/22 1436)  potassium chloride SA (KLOR-CON M) CR tablet 40 mEq (40 mEq Oral Given 10/31/22 1435)  magnesium oxide (MAG-OX) tablet 400 mg (400 mg Oral Given 10/31/22 1435)  iohexol (OMNIPAQUE) 350 MG/ML injection 75 mL (75 mLs Intravenous Contrast Given 10/31/22 1400)  cefTRIAXone (ROCEPHIN) 1 g in sodium chloride 0.9 % 100 mL IVPB (0 g Intravenous Stopped 10/31/22 1644)    ED Course/ Medical Decision Making/ A&P                                 Medical Decision Making Amount and/or Complexity of Data Reviewed Labs: ordered. Decision-making details documented in ED Course. Radiology: ordered. Decision-making details documented in ED  Course.  Risk OTC drugs. Prescription drug management.  Medical Decision Making:   TILLMAN KAZMIERSKI is a 82 y.o. male who presented to the ED today with dark urine detailed above.    Patient's presentation is complicated by their history of advanced age, hypertension, hyperlipidemia, CAD.  Complete initial physical exam performed, notably the patient was in no acute distress.  Abdomen soft and nontender.  No CVA tenderness.  Patient neurologically intact.  Nontoxic-appearing.    Reviewed and confirmed nursing documentation for past medical history, family history, social history.    Initial Assessment:   With the patient's presentation, differential diagnosis includes but is not limited to acute cystitis, pyelonephritis, ureteral stone, rhabdomyolysis, prostatitis, malignancy, dehydration, electrolyte disturbance, infectious process, trauma, BPH, urinary retention.  This is most consistent with an acute complicated illness  Initial Plan:  Screening labs including CBC and Metabolic panel to evaluate for infectious or metabolic etiology of disease.  Urinalysis with reflex culture ordered to evaluate for UTI or relevant urologic/nephrologic pathology.  Coags to assess for bleeding disturbance CTAP to assess for intra-abdominal pathology Fluids with concern for dehydration and borderline BP Objective evaluation as below reviewed   Initial Study Results:   Laboratory  All laboratory results reviewed without evidence of clinically relevant pathology.   Exceptions include: K3.1, calcium 8.3, lipase 56, hemoglobin 12.5, PT 15.8, UA with 11-20 white blood cells  Radiology:  All images reviewed independently. Agree with radiology report at this time.   CT ABDOMEN PELVIS W CONTRAST  Result Date: 10/31/2022 CLINICAL DATA:  Abdominal pain, acute, nonlocalized. Hematuria. Dark urine. Anorexia. EXAM: CT ABDOMEN AND PELVIS WITH CONTRAST TECHNIQUE: Multidetector CT imaging of the abdomen and pelvis  was performed using the standard protocol following bolus administration of intravenous contrast. RADIATION DOSE REDUCTION: This exam was performed according to the departmental dose-optimization program which includes automated exposure control, adjustment of the mA and/or kV according to patient size and/or use of iterative reconstruction technique. CONTRAST:  75mL OMNIPAQUE IOHEXOL 350 MG/ML SOLN COMPARISON:  03/31/2013 FINDINGS: Lower chest: Mild atelectasis and possibly scarring at the lung bases. Hepatobiliary: Liver parenchyma is normal. Layering sludge or tiny stones in the gallbladder. No CT evidence of cholecystitis or obstruction. No hyperdense material seen in the common duct. Pancreas: Normal Spleen: Normal Adrenals/Urinary Tract: Adrenal glands are normal. Right kidney is normal. No cyst, mass, stone or hydronephrosis. Left kidney contains 2 simple appearing cysts that do not require further follow-up. The largest is in the lower pole measuring 4.6 cm in diameter. No stone or hydronephrosis. The bladder appears normal by CT. Stomach/Bowel: Stomach and small intestine are normal. Previous appendectomy. Hyperdense material throughout the colon presumably related to either previously administered contrast or ingestion of hyperdense material. No sign of bowel obstruction or inflammation. Vascular/Lymphatic: Aortic atherosclerosis. No significant change since the previous study. Maximal diameter of the infrarenal aorta measures 2.6 cm. IVC is normal. No adenopathy. Reproductive: Normal Other: No free fluid or air. Musculoskeletal: Ordinary mild chronic degenerative changes of the spine. IMPRESSION: 1. Layering sludge or tiny stones in the gallbladder. No CT evidence of cholecystitis or obstruction. 2. No abnormality seen to specifically explain the clinical presentation. Urinary tract appears unremarkable. 3. Aortic atherosclerosis. Maximal diameter of the infrarenal aorta measures 2.6 cm. 4. Hyperdense  material throughout the colon presumably related to either previously administered oral contrast or ingestion of other hyperdense material. No sign of bowel obstruction or inflammation. This could be an indication of constipation. Aortic Atherosclerosis (ICD10-I70.0). Electronically Signed   By: Scherrie Bateman.D.  On: 10/31/2022 14:32      Final Assessment and Plan:   82 year old male presents to the ED with change in urine color.  No associated pain.  Abdomen soft and nontender.  Initially with slightly decreased blood pressure.  States that he has had poor p.o. intake for several weeks due to lack of appetite.  No acute volume losses.  Afebrile.  Nontoxic-appearing.  Workup initiated as above for further assessment.  No urinary retention on bladder scan.  He does have a hypokalemia at 3.1 which was repleted in addition to magnesium.  Has a slightly elevated lipase but inconsistent with pancreatitis.  CK within normal limits.  Patient neurologically intact.  No other significant findings on blood work.  UA does have 11-20 white blood cells as well as 6-10 red blood cells.  Nitrate negative.  With patient's symptoms however we will proceed with treatment for UTI.  CT shows some possible constipation but no other significant acute intra-abdominal finding.  No obstruction.  No ureterolithiasis.  No masses visualized.  Discussed all findings with patient and brother at bedside.  Will give dose of IV Rocephin for treatment of UTI.  Patient expresses concern with the treatment he is receiving at his current rehab facility.  He would like to be placed elsewhere.  TOC consult placed for help with placement.  Patient is currently stable.  Willing to wait for TOC recommendations.  Discussed with social work and patient will need to address his concerns with facility director on Monday as well as any request for placement change.  Patient and guardian agreeable to do so.  Patient stable for discharge home.  Aware of  findings today and need for close outpatient follow-up with primary care, urology, as well as discharge prescriptions.  Strict ED return precautions given, all questions answered, and stable for discharge.   Clinical Impression:  1. Acute cystitis with hematuria   2. Dehydration   3. Hypokalemia   4. Slow transit constipation      Discharge           Final Clinical Impression(s) / ED Diagnoses Final diagnoses:  Dehydration  Hypokalemia  Slow transit constipation  Acute cystitis with hematuria    Rx / DC Orders ED Discharge Orders          Ordered    ciprofloxacin (CIPRO) 500 MG tablet  Every 12 hours        10/31/22 1554    potassium chloride SA (KLOR-CON M) 20 MEQ tablet  Daily        10/31/22 1554    ondansetron (ZOFRAN) 4 MG tablet  Every 8 hours PRN        10/31/22 1554              , Lawrence Marseilles, PA-C 10/31/22 1654    Young, Harmon Dun, DO 10/31/22 2056

## 2022-10-31 NOTE — ED Triage Notes (Signed)
Patient BIB EMS from [facility], c/o dark brown urine x1wks. Mid ABD pain, painful urination. A&Ox4, 110/70, 94%Ra CGB 149.

## 2022-11-02 DIAGNOSIS — N3946 Mixed incontinence: Secondary | ICD-10-CM | POA: Diagnosis not present

## 2022-11-02 DIAGNOSIS — I5020 Unspecified systolic (congestive) heart failure: Secondary | ICD-10-CM | POA: Diagnosis not present

## 2022-11-02 DIAGNOSIS — M6281 Muscle weakness (generalized): Secondary | ICD-10-CM | POA: Diagnosis not present

## 2022-11-02 DIAGNOSIS — L89313 Pressure ulcer of right buttock, stage 3: Secondary | ICD-10-CM | POA: Diagnosis not present

## 2022-11-02 DIAGNOSIS — F339 Major depressive disorder, recurrent, unspecified: Secondary | ICD-10-CM | POA: Diagnosis not present

## 2022-11-02 DIAGNOSIS — R627 Adult failure to thrive: Secondary | ICD-10-CM | POA: Diagnosis not present

## 2022-11-02 DIAGNOSIS — L89153 Pressure ulcer of sacral region, stage 3: Secondary | ICD-10-CM | POA: Diagnosis not present

## 2022-11-02 DIAGNOSIS — E441 Mild protein-calorie malnutrition: Secondary | ICD-10-CM | POA: Diagnosis not present

## 2022-11-06 DIAGNOSIS — L98419 Non-pressure chronic ulcer of buttock with unspecified severity: Secondary | ICD-10-CM | POA: Diagnosis not present

## 2022-11-06 DIAGNOSIS — L89159 Pressure ulcer of sacral region, unspecified stage: Secondary | ICD-10-CM | POA: Diagnosis not present

## 2022-11-06 DIAGNOSIS — I509 Heart failure, unspecified: Secondary | ICD-10-CM | POA: Diagnosis not present

## 2022-11-06 DIAGNOSIS — I10 Essential (primary) hypertension: Secondary | ICD-10-CM | POA: Diagnosis not present

## 2022-11-06 DIAGNOSIS — M6281 Muscle weakness (generalized): Secondary | ICD-10-CM | POA: Diagnosis not present

## 2022-11-16 ENCOUNTER — Ambulatory Visit: Payer: PPO | Attending: Cardiovascular Disease | Admitting: Cardiovascular Disease

## 2022-11-25 DIAGNOSIS — I509 Heart failure, unspecified: Secondary | ICD-10-CM | POA: Diagnosis not present

## 2022-11-25 DIAGNOSIS — I110 Hypertensive heart disease with heart failure: Secondary | ICD-10-CM | POA: Diagnosis not present

## 2022-11-25 DIAGNOSIS — I82411 Acute embolism and thrombosis of right femoral vein: Secondary | ICD-10-CM | POA: Diagnosis not present

## 2022-11-25 DIAGNOSIS — I2510 Atherosclerotic heart disease of native coronary artery without angina pectoris: Secondary | ICD-10-CM | POA: Diagnosis not present

## 2022-11-25 DIAGNOSIS — Z951 Presence of aortocoronary bypass graft: Secondary | ICD-10-CM | POA: Diagnosis not present

## 2022-11-25 DIAGNOSIS — F0283 Dementia in other diseases classified elsewhere, unspecified severity, with mood disturbance: Secondary | ICD-10-CM | POA: Diagnosis not present

## 2022-11-25 DIAGNOSIS — I5041 Acute combined systolic (congestive) and diastolic (congestive) heart failure: Secondary | ICD-10-CM | POA: Diagnosis not present

## 2022-11-25 DIAGNOSIS — E039 Hypothyroidism, unspecified: Secondary | ICD-10-CM | POA: Diagnosis not present

## 2022-11-25 DIAGNOSIS — K219 Gastro-esophageal reflux disease without esophagitis: Secondary | ICD-10-CM | POA: Diagnosis not present

## 2022-11-25 DIAGNOSIS — L89153 Pressure ulcer of sacral region, stage 3: Secondary | ICD-10-CM | POA: Diagnosis not present

## 2022-11-25 DIAGNOSIS — Z79899 Other long term (current) drug therapy: Secondary | ICD-10-CM | POA: Diagnosis not present

## 2022-11-25 DIAGNOSIS — Z8701 Personal history of pneumonia (recurrent): Secondary | ICD-10-CM | POA: Diagnosis not present

## 2022-11-25 DIAGNOSIS — G9340 Encephalopathy, unspecified: Secondary | ICD-10-CM | POA: Diagnosis not present

## 2022-11-25 DIAGNOSIS — Z7901 Long term (current) use of anticoagulants: Secondary | ICD-10-CM | POA: Diagnosis not present

## 2022-11-25 DIAGNOSIS — I959 Hypotension, unspecified: Secondary | ICD-10-CM | POA: Diagnosis not present

## 2022-11-25 DIAGNOSIS — I2699 Other pulmonary embolism without acute cor pulmonale: Secondary | ICD-10-CM | POA: Diagnosis not present

## 2022-11-25 DIAGNOSIS — Z993 Dependence on wheelchair: Secondary | ICD-10-CM | POA: Diagnosis not present

## 2022-11-25 DIAGNOSIS — M47816 Spondylosis without myelopathy or radiculopathy, lumbar region: Secondary | ICD-10-CM | POA: Diagnosis not present

## 2022-11-25 DIAGNOSIS — Z87440 Personal history of urinary (tract) infections: Secondary | ICD-10-CM | POA: Diagnosis not present

## 2022-11-25 DIAGNOSIS — Z8616 Personal history of COVID-19: Secondary | ICD-10-CM | POA: Diagnosis not present

## 2022-11-25 DIAGNOSIS — E785 Hyperlipidemia, unspecified: Secondary | ICD-10-CM | POA: Diagnosis not present

## 2022-11-25 DIAGNOSIS — E876 Hypokalemia: Secondary | ICD-10-CM | POA: Diagnosis not present

## 2022-11-25 DIAGNOSIS — M48061 Spinal stenosis, lumbar region without neurogenic claudication: Secondary | ICD-10-CM | POA: Diagnosis not present

## 2022-11-25 DIAGNOSIS — D649 Anemia, unspecified: Secondary | ICD-10-CM | POA: Diagnosis not present

## 2022-11-25 DIAGNOSIS — F32A Depression, unspecified: Secondary | ICD-10-CM | POA: Diagnosis not present

## 2022-11-25 DIAGNOSIS — I4819 Other persistent atrial fibrillation: Secondary | ICD-10-CM | POA: Diagnosis not present

## 2022-11-25 DIAGNOSIS — E538 Deficiency of other specified B group vitamins: Secondary | ICD-10-CM | POA: Diagnosis not present

## 2022-11-25 DIAGNOSIS — R627 Adult failure to thrive: Secondary | ICD-10-CM | POA: Diagnosis not present

## 2022-11-25 DIAGNOSIS — E440 Moderate protein-calorie malnutrition: Secondary | ICD-10-CM | POA: Diagnosis not present

## 2022-12-01 DIAGNOSIS — I509 Heart failure, unspecified: Secondary | ICD-10-CM | POA: Diagnosis not present

## 2022-12-01 DIAGNOSIS — Z7189 Other specified counseling: Secondary | ICD-10-CM | POA: Diagnosis not present

## 2022-12-01 DIAGNOSIS — I10 Essential (primary) hypertension: Secondary | ICD-10-CM | POA: Diagnosis not present

## 2022-12-01 DIAGNOSIS — E785 Hyperlipidemia, unspecified: Secondary | ICD-10-CM | POA: Diagnosis not present

## 2022-12-01 DIAGNOSIS — L98419 Non-pressure chronic ulcer of buttock with unspecified severity: Secondary | ICD-10-CM | POA: Diagnosis not present

## 2022-12-07 ENCOUNTER — Ambulatory Visit (HOSPITAL_BASED_OUTPATIENT_CLINIC_OR_DEPARTMENT_OTHER): Payer: PPO | Admitting: Internal Medicine

## 2022-12-08 DIAGNOSIS — N390 Urinary tract infection, site not specified: Secondary | ICD-10-CM | POA: Diagnosis not present

## 2022-12-09 ENCOUNTER — Emergency Department (HOSPITAL_COMMUNITY): Payer: PPO

## 2022-12-09 ENCOUNTER — Encounter (HOSPITAL_BASED_OUTPATIENT_CLINIC_OR_DEPARTMENT_OTHER): Payer: PPO | Admitting: Internal Medicine

## 2022-12-09 ENCOUNTER — Other Ambulatory Visit: Payer: Self-pay

## 2022-12-09 ENCOUNTER — Encounter (HOSPITAL_COMMUNITY): Payer: Self-pay

## 2022-12-09 ENCOUNTER — Inpatient Hospital Stay (HOSPITAL_COMMUNITY)
Admission: EM | Admit: 2022-12-09 | Discharge: 2022-12-21 | DRG: 871 | Disposition: A | Discharge status: Home Health | Payer: PPO | Attending: Internal Medicine | Admitting: Internal Medicine

## 2022-12-09 DIAGNOSIS — D6489 Other specified anemias: Secondary | ICD-10-CM | POA: Diagnosis present

## 2022-12-09 DIAGNOSIS — R0600 Dyspnea, unspecified: Secondary | ICD-10-CM | POA: Diagnosis not present

## 2022-12-09 DIAGNOSIS — R4182 Altered mental status, unspecified: Principal | ICD-10-CM

## 2022-12-09 DIAGNOSIS — I4892 Unspecified atrial flutter: Secondary | ICD-10-CM | POA: Diagnosis not present

## 2022-12-09 DIAGNOSIS — R6521 Severe sepsis with septic shock: Secondary | ICD-10-CM | POA: Diagnosis present

## 2022-12-09 DIAGNOSIS — N390 Urinary tract infection, site not specified: Secondary | ICD-10-CM | POA: Diagnosis present

## 2022-12-09 DIAGNOSIS — I5042 Chronic combined systolic (congestive) and diastolic (congestive) heart failure: Secondary | ICD-10-CM | POA: Insufficient documentation

## 2022-12-09 DIAGNOSIS — Z951 Presence of aortocoronary bypass graft: Secondary | ICD-10-CM

## 2022-12-09 DIAGNOSIS — I5021 Acute systolic (congestive) heart failure: Secondary | ICD-10-CM

## 2022-12-09 DIAGNOSIS — F0393 Unspecified dementia, unspecified severity, with mood disturbance: Secondary | ICD-10-CM | POA: Diagnosis present

## 2022-12-09 DIAGNOSIS — R627 Adult failure to thrive: Secondary | ICD-10-CM | POA: Diagnosis present

## 2022-12-09 DIAGNOSIS — R410 Disorientation, unspecified: Secondary | ICD-10-CM | POA: Diagnosis not present

## 2022-12-09 DIAGNOSIS — R6510 Systemic inflammatory response syndrome (SIRS) of non-infectious origin without acute organ dysfunction: Secondary | ICD-10-CM

## 2022-12-09 DIAGNOSIS — R0902 Hypoxemia: Secondary | ICD-10-CM | POA: Diagnosis not present

## 2022-12-09 DIAGNOSIS — A419 Sepsis, unspecified organism: Secondary | ICD-10-CM | POA: Diagnosis not present

## 2022-12-09 DIAGNOSIS — I428 Other cardiomyopathies: Secondary | ICD-10-CM | POA: Diagnosis present

## 2022-12-09 DIAGNOSIS — M542 Cervicalgia: Secondary | ICD-10-CM | POA: Diagnosis not present

## 2022-12-09 DIAGNOSIS — E8809 Other disorders of plasma-protein metabolism, not elsewhere classified: Secondary | ICD-10-CM | POA: Diagnosis present

## 2022-12-09 DIAGNOSIS — I82441 Acute embolism and thrombosis of right tibial vein: Secondary | ICD-10-CM | POA: Diagnosis present

## 2022-12-09 DIAGNOSIS — L89892 Pressure ulcer of other site, stage 2: Secondary | ICD-10-CM | POA: Diagnosis present

## 2022-12-09 DIAGNOSIS — L89159 Pressure ulcer of sacral region, unspecified stage: Secondary | ICD-10-CM | POA: Diagnosis present

## 2022-12-09 DIAGNOSIS — I252 Old myocardial infarction: Secondary | ICD-10-CM

## 2022-12-09 DIAGNOSIS — I2510 Atherosclerotic heart disease of native coronary artery without angina pectoris: Secondary | ICD-10-CM | POA: Diagnosis present

## 2022-12-09 DIAGNOSIS — R9431 Abnormal electrocardiogram [ECG] [EKG]: Secondary | ICD-10-CM

## 2022-12-09 DIAGNOSIS — F32A Depression, unspecified: Secondary | ICD-10-CM | POA: Diagnosis present

## 2022-12-09 DIAGNOSIS — Z8249 Family history of ischemic heart disease and other diseases of the circulatory system: Secondary | ICD-10-CM

## 2022-12-09 DIAGNOSIS — Z7401 Bed confinement status: Secondary | ICD-10-CM

## 2022-12-09 DIAGNOSIS — I2694 Multiple subsegmental pulmonary emboli without acute cor pulmonale: Secondary | ICD-10-CM | POA: Diagnosis present

## 2022-12-09 DIAGNOSIS — Z7901 Long term (current) use of anticoagulants: Secondary | ICD-10-CM

## 2022-12-09 DIAGNOSIS — E876 Hypokalemia: Secondary | ICD-10-CM | POA: Diagnosis present

## 2022-12-09 DIAGNOSIS — I4819 Other persistent atrial fibrillation: Secondary | ICD-10-CM

## 2022-12-09 DIAGNOSIS — E872 Acidosis, unspecified: Secondary | ICD-10-CM | POA: Diagnosis present

## 2022-12-09 DIAGNOSIS — I5041 Acute combined systolic (congestive) and diastolic (congestive) heart failure: Secondary | ICD-10-CM | POA: Insufficient documentation

## 2022-12-09 DIAGNOSIS — J690 Pneumonitis due to inhalation of food and vomit: Secondary | ICD-10-CM | POA: Diagnosis present

## 2022-12-09 DIAGNOSIS — I5043 Acute on chronic combined systolic (congestive) and diastolic (congestive) heart failure: Secondary | ICD-10-CM | POA: Diagnosis present

## 2022-12-09 DIAGNOSIS — L8990 Pressure ulcer of unspecified site, unspecified stage: Secondary | ICD-10-CM | POA: Insufficient documentation

## 2022-12-09 DIAGNOSIS — D638 Anemia in other chronic diseases classified elsewhere: Secondary | ICD-10-CM | POA: Diagnosis present

## 2022-12-09 DIAGNOSIS — N179 Acute kidney failure, unspecified: Secondary | ICD-10-CM | POA: Diagnosis present

## 2022-12-09 DIAGNOSIS — Z79890 Hormone replacement therapy (postmenopausal): Secondary | ICD-10-CM

## 2022-12-09 DIAGNOSIS — K219 Gastro-esophageal reflux disease without esophagitis: Secondary | ICD-10-CM | POA: Diagnosis present

## 2022-12-09 DIAGNOSIS — F05 Delirium due to known physiological condition: Secondary | ICD-10-CM | POA: Diagnosis not present

## 2022-12-09 DIAGNOSIS — J189 Pneumonia, unspecified organism: Secondary | ICD-10-CM | POA: Diagnosis present

## 2022-12-09 DIAGNOSIS — I82431 Acute embolism and thrombosis of right popliteal vein: Secondary | ICD-10-CM | POA: Diagnosis present

## 2022-12-09 DIAGNOSIS — R0689 Other abnormalities of breathing: Secondary | ICD-10-CM | POA: Diagnosis not present

## 2022-12-09 DIAGNOSIS — Z79899 Other long term (current) drug therapy: Secondary | ICD-10-CM

## 2022-12-09 DIAGNOSIS — I82451 Acute embolism and thrombosis of right peroneal vein: Secondary | ICD-10-CM | POA: Diagnosis present

## 2022-12-09 DIAGNOSIS — Z8673 Personal history of transient ischemic attack (TIA), and cerebral infarction without residual deficits: Secondary | ICD-10-CM

## 2022-12-09 DIAGNOSIS — E785 Hyperlipidemia, unspecified: Secondary | ICD-10-CM | POA: Diagnosis present

## 2022-12-09 DIAGNOSIS — I110 Hypertensive heart disease with heart failure: Secondary | ICD-10-CM | POA: Diagnosis present

## 2022-12-09 DIAGNOSIS — E039 Hypothyroidism, unspecified: Secondary | ICD-10-CM | POA: Diagnosis present

## 2022-12-09 DIAGNOSIS — E538 Deficiency of other specified B group vitamins: Secondary | ICD-10-CM | POA: Diagnosis present

## 2022-12-09 DIAGNOSIS — G9341 Metabolic encephalopathy: Secondary | ICD-10-CM | POA: Diagnosis present

## 2022-12-09 DIAGNOSIS — E162 Hypoglycemia, unspecified: Secondary | ICD-10-CM | POA: Diagnosis not present

## 2022-12-09 DIAGNOSIS — Z8616 Personal history of COVID-19: Secondary | ICD-10-CM

## 2022-12-09 DIAGNOSIS — I82411 Acute embolism and thrombosis of right femoral vein: Secondary | ICD-10-CM | POA: Diagnosis present

## 2022-12-09 DIAGNOSIS — Z7982 Long term (current) use of aspirin: Secondary | ICD-10-CM

## 2022-12-09 DIAGNOSIS — Z83438 Family history of other disorder of lipoprotein metabolism and other lipidemia: Secondary | ICD-10-CM

## 2022-12-09 DIAGNOSIS — Z833 Family history of diabetes mellitus: Secondary | ICD-10-CM

## 2022-12-09 DIAGNOSIS — R32 Unspecified urinary incontinence: Secondary | ICD-10-CM | POA: Diagnosis present

## 2022-12-09 DIAGNOSIS — E440 Moderate protein-calorie malnutrition: Secondary | ICD-10-CM | POA: Insufficient documentation

## 2022-12-09 DIAGNOSIS — Z6824 Body mass index (BMI) 24.0-24.9, adult: Secondary | ICD-10-CM

## 2022-12-09 LAB — I-STAT CG4 LACTIC ACID, ED: Lactic Acid, Venous: 3.3 mmol/L (ref 0.5–1.9)

## 2022-12-09 MED ORDER — SODIUM CHLORIDE 0.9 % IV BOLUS
1000.0000 mL | Freq: Once | INTRAVENOUS | Status: AC
  Administered 2022-12-10: 1000 mL via INTRAVENOUS

## 2022-12-09 NOTE — ED Triage Notes (Signed)
PT was bib by [facility] from home.PT has a 24hr nurse at his home. Day nurse reports confusion x 5 days,Night nurse reports PT is acting normal, POA was called and they reported PT has been altered x 3 days and they wanted him checked out at the hospital.PT is A+0x4 at this current time. BP taken by EMS was hypotensive at 98/62. CBG was 87 and 02 stats were 94% room air.

## 2022-12-09 NOTE — ED Notes (Signed)
Patient transported to CT 

## 2022-12-09 NOTE — ED Provider Notes (Signed)
[place] EMERGENCY DEPARTMENT AT [facility] Provider Note   CSN: [id number] Arrival date & time: 12/09/22  2142    History  Chief Complaint  Patient presents with   Altered Mental Status    Bradley Oconnor is a 82 y.o. male history of CAD, hypertension, GERD, bedbound at baseline here for evaluation of altered mental status.  History is unclear as he has had new home health aides this week.Marland Kitchen  His typical home health aid went out to his house for her evening shift was told by new aids patient has been confused over the last 3-5 days and "talking out of his head."  He apparently had a fall on Sunday.  Aide unsure if he was seen at that time as she was not there. She states that when she got to the house today he seemed like he was on his baseline however is obviously upset and unsure why. Her manager told her to call EMS as the other aide said he was altered.  When she got to the emergency department today she did state that patient was confused beyond his baseline. He has been eating and drinking normally as far as she knows.  Patient has no complaints.  Aide states that patient was started on Cipro 2 days ago for possible UTI causing confusion however per aide did not think urine was tested.  Denies headache, fever, CP, SOB, nausea, vomiting, abdominal pain, unilateral weakness, back pain, LE pain.  He is incontinent at baseline per aide. No recent travel. Hx of frequent UTI per family. No hx of arrhythmia, CHF, PE, DVT recent sepsis.  HPI     Home Medications Prior to Admission medications   Medication Sig Start Date End Date Taking? Authorizing Provider  amLODipine (NORVASC) 5 MG tablet Take 1 tablet (5 mg total) by mouth daily. 05/29/22   Lennette Bihari, MD  aspirin EC 81 MG tablet Take 1 tablet (81 mg total) by mouth daily. 04/06/16   Lennette Bihari, MD  dextromethorphan-guaiFENesin [facility] DM) 30-600 MG 12hr tablet Take 1 tablet by mouth 2 (two) times daily. 04/11/21    Janeece Agee, NP  esomeprazole (NEXIUM) 20 MG capsule Take 20 mg by mouth daily at 12 noon.    [provider]  fish oil-omega-3 fatty acids 1000 MG capsule Take 1 g by mouth daily.     [provider]  fluticasone (FLONASE) 50 MCG/ACT nasal spray Place 2 sprays into both nostrils daily. 04/11/21   Janeece Agee, NP  Garlic 1000 MG CAPS Take 1 capsule by mouth daily.     [provider]  hydrochlorothiazide (MICROZIDE) 12.5 MG capsule TAKE 1 CAPSULE BY MOUTH ONCE DAILY . APPOINTMENT REQUIRED FOR FUTURE REFILLS 09/01/22   Lennette Bihari, MD  isosorbide mononitrate (IMDUR) 120 MG 24 hr tablet Take 1 tablet by mouth once daily 09/01/22   Lennette Bihari, MD  lansoprazole (PREVACID) 15 MG capsule Take 15 mg by mouth daily at 12 noon.    [provider]  levothyroxine (SYNTHROID) 100 MCG tablet TAKE 1 TABLET BY MOUTH ONCE DAILY IN THE MORNING -  NEED  OFFICE  VISIT  WITH  DR.  Tresa Endo  FOR  FUTURE  REFILLS 06/29/22   Lennette Bihari, MD  metoprolol succinate (TOPROL-XL) 100 MG 24 hr tablet TAKE 1 TABLET BY MOUTH ONCE DAILY . APPOINTMENT REQUIRED FOR FUTURE REFILLS 09/01/22   Lennette Bihari, MD  nitroGLYCERIN (NITROSTAT) 0.4 MG SL tablet DISSOLVE ONE  TABLET UNDER THE TONGUE EVERY 5 MINUTES AS NEEDED FOR CHEST PAIN.  DO NOT EXCEED A TOTAL OF 3 DOSES IN 15 MINUTES 06/26/21   Lennette Bihari, MD  potassium chloride SA (KLOR-CON M) 20 MEQ tablet Take 1 tablet (20 mEq total) by mouth daily for 5 days. 10/31/22 11/05/22  Gowens, Mariah L, PA-C  ramipril (ALTACE) 10 MG capsule Take 1 capsule (10 mg total) by mouth daily. 11/17/21   Lennette Bihari, MD  ranolazine (RANEXA) 1000 MG SR tablet TAKE 1  BY MOUTH TWICE DAILY 09/01/22   Lennette Bihari, MD  rosuvastatin (CRESTOR) 20 MG tablet TAKE 1 TABLET BY MOUTH ONCE DAILY IN THE EVENING 07/15/22   Lennette Bihari, MD      Allergies    Procardia [nifedipine] and Phenergan [promethazine hcl]    Review of Systems   Review of Systems  Unable  to perform ROS: Mental status change  Constitutional: Negative.   HENT: Negative.    Respiratory: Negative.    Cardiovascular: Negative.   Gastrointestinal: Negative.   Genitourinary: Negative.   Musculoskeletal: Negative.   Skin: Negative.   All other systems reviewed and are negative.   Physical Exam Updated Vital Signs BP (!) 140/83   Pulse (!) 135   Temp (!) 97.4 F (36.3 C) (Oral)   Resp 13   Ht 5\' 8"  (1.727 m)   Wt 81.5 kg   SpO2 100%   BMI 27.32 kg/m  Physical Exam Vitals and nursing note reviewed.  Constitutional:      General: He is not in acute distress.    Appearance: He is well-developed. He is not ill-appearing, toxic-appearing or diaphoretic.     Comments: Frail, chronically ill appearing  HENT:     Head: Atraumatic.     Nose: Nose normal.     Mouth/Throat:     Mouth: Mucous membranes are moist.  Eyes:     Pupils: Pupils are equal, round, and reactive to light.  Cardiovascular:     Rate and Rhythm: Regular rhythm. Tachycardia present.     Pulses: Normal pulses.          Radial pulses are 2+ on the right side and 2+ on the left side.       Dorsalis pedis pulses are 2+ on the right side and 2+ on the left side.     Heart sounds: Normal heart sounds.     Comments: Tachycardic, irregular Pulmonary:     Effort: Pulmonary effort is normal. No respiratory distress.     Breath sounds: Normal breath sounds.  Abdominal:     General: Bowel sounds are normal. There is no distension.     Palpations: Abdomen is soft.     Tenderness: There is no abdominal tenderness. There is no right CVA tenderness, left CVA tenderness or guarding.  Musculoskeletal:        General: No swelling, tenderness, deformity or signs of injury. Normal range of motion.     Cervical back: Normal range of motion and neck supple.     Comments: Muscle wasting to extremities  Skin:    General: Skin is warm and dry.     Capillary Refill: Capillary refill takes less than 2 seconds.   Neurological:     Mental Status: He is alert.     Sensory: Sensation is intact.     Comments: Alert to person, states he is in [place], year 2024, president Biden Cranial nerves II through XII grossly intact Global weakness  throughout      ED Results / Procedures / Treatments   Labs (all labs ordered are listed, but only abnormal results are displayed) Labs Reviewed  CULTURE, BLOOD (ROUTINE X 2)  CULTURE, BLOOD (ROUTINE X 2)  CBC WITH DIFFERENTIAL/PLATELET  COMPREHENSIVE METABOLIC PANEL  URINALYSIS, W/ REFLEX TO CULTURE (INFECTION SUSPECTED)  MAGNESIUM  I-STAT CG4 LACTIC ACID, ED  I-STAT CG4 LACTIC ACID, ED  TROPONIN I (HIGH SENSITIVITY)    EKG EKG Interpretation Date/Time:  Wednesday December 09 2022 21:47:23 EDT Ventricular Rate:  126 PR Interval:    QRS Duration:  103 QT Interval:  346 QTC Calculation: 501 R Axis:   3  Text Interpretation: Atrial fibrillation Inferior infarct, old Probable anterior infarct, age indeterminate Prolonged QT interval Atrial flutter Confirmed by Anders Simmonds [phone]) on 12/09/2022 9:55:40 PM  Radiology DG Chest 2 View  Result Date: 12/09/2022 CLINICAL DATA:  Dyspnea, altered mental status EXAM: CHEST - 2 VIEW COMPARISON:  12/31/2016 FINDINGS: Lateral examination slightly limited with exclusion of the a posterior lungs and costophrenic angles. Lungs are clear. No pneumothorax or pleural effusion. Coronary artery bypass grafting has been performed. Cardiac size within normal limits. Pulmonary vascularity is normal. No acute bone abnormality. IMPRESSION: 1. No active cardiopulmonary disease. 2. Status post coronary artery bypass grafting. Electronically Signed   By: Helyn Numbers M.D.   On: 12/09/2022 23:07    Procedures .Critical Care  Performed by: Linwood Dibbles, PA-C Authorized by: Linwood Dibbles, PA-C   Critical care provider statement:    Critical care time (minutes):  35   Critical care was necessary to treat or  prevent imminent or life-threatening deterioration of the following conditions: sirs, hypotension, AMS.   Critical care was time spent personally by me on the following activities:  Development of treatment plan with patient or surrogate, discussions with consultants, evaluation of patient's response to treatment, examination of patient, ordering and review of laboratory studies, ordering and review of radiographic studies, ordering and performing treatments and interventions, pulse oximetry, re-evaluation of patient's condition and review of old charts     Medications Ordered in ED Medications  sodium chloride 0.9 % bolus 1,000 mL (has no administration in time range)   ED Course/ Medical Decision Making/ A&P   82 year old history of known cognitive impairment here for evaluation of altered mental status.  Patient has 24/7 in-home care.  Noted to have confusion increasing over the last few days.  Thought he had UTI, started on Cipro.  Aide that typically works with him thought patient was at his baseline however on arrival to the emergency department she states he is altered from his normal.  He is not ambulatory at baseline however has no documented reason as to why he is not ambulatory.  Apparently had a fall on Sunday.  Aide unsure if he was seen at that time.  Patient altered however confused about location which he is typically aware of.  He has no focal deficits.  Tachycardic with new onset a flutter, no history of similar per chart review.  Denies any chest pain, shortness of breath.  Denies any history of PE, DVT, recent surgery, immobilization aside from his chronic bedbound state, malignancy.  Initially blood pressure was 140 systolic.  EMS did note his blood pressure was 98 systolic on arrival.  He was not given fluids.  On my reassessment patient blood pressure 100 systolic.  Will plan on labs, imaging and reassess.  Will start on IV fluids obtain sepsis labs  discussed with attending, Dr.  Andria Meuse recommends trial of IV fluids to see if can improve heart rate and blood pressure prior to starting on Cardizem he is not currently anticoagulated.  Labs and imaging personally viewed and interpreted:  Chest x-ray without acute abnormality  Unfortunately delay in labs, IV fluids per nursing.  I encouraged nursing to start IV fluids given his downtrending BP.   Care transferred to oncoming provider who will follow-up on labs, imaging and admission.                                 Medical Decision Making Amount and/or Complexity of Data Reviewed Independent Historian: EMS External Data Reviewed: labs, radiology, ECG and notes. Labs: ordered. Decision-making details documented in ED Course. Radiology: ordered and independent interpretation performed. Decision-making details documented in ED Course. ECG/medicine tests: ordered and independent interpretation performed. Decision-making details documented in ED Course.  Risk OTC drugs. Prescription drug management. Parenteral controlled substances. Decision regarding hospitalization. Diagnosis or treatment significantly limited by social determinants of health.         Final Clinical Impression(s) / ED Diagnoses Final diagnoses:  Altered mental status, unspecified altered mental status type  Atrial flutter, unspecified type (HCC)  SIRS (systemic inflammatory response syndrome) (HCC)    Rx / DC Orders ED Discharge Orders     None         Teigen Bellin A, PA-C 12/10/22 1534    Anders Simmonds T, DO 12/12/22 1518

## 2022-12-10 ENCOUNTER — Inpatient Hospital Stay (HOSPITAL_COMMUNITY): Payer: PPO

## 2022-12-10 ENCOUNTER — Inpatient Hospital Stay: Payer: Self-pay

## 2022-12-10 ENCOUNTER — Emergency Department (HOSPITAL_COMMUNITY): Payer: PPO

## 2022-12-10 DIAGNOSIS — R6521 Severe sepsis with septic shock: Secondary | ICD-10-CM

## 2022-12-10 DIAGNOSIS — Z86711 Personal history of pulmonary embolism: Secondary | ICD-10-CM

## 2022-12-10 DIAGNOSIS — R9431 Abnormal electrocardiogram [ECG] [EKG]: Secondary | ICD-10-CM | POA: Diagnosis not present

## 2022-12-10 DIAGNOSIS — Z452 Encounter for adjustment and management of vascular access device: Secondary | ICD-10-CM | POA: Diagnosis not present

## 2022-12-10 DIAGNOSIS — I428 Other cardiomyopathies: Secondary | ICD-10-CM | POA: Diagnosis not present

## 2022-12-10 DIAGNOSIS — A419 Sepsis, unspecified organism: Secondary | ICD-10-CM | POA: Diagnosis present

## 2022-12-10 DIAGNOSIS — E872 Acidosis, unspecified: Secondary | ICD-10-CM | POA: Diagnosis not present

## 2022-12-10 DIAGNOSIS — I5041 Acute combined systolic (congestive) and diastolic (congestive) heart failure: Secondary | ICD-10-CM | POA: Diagnosis not present

## 2022-12-10 DIAGNOSIS — R0602 Shortness of breath: Secondary | ICD-10-CM | POA: Diagnosis not present

## 2022-12-10 DIAGNOSIS — R0989 Other specified symptoms and signs involving the circulatory and respiratory systems: Secondary | ICD-10-CM | POA: Diagnosis not present

## 2022-12-10 DIAGNOSIS — I82441 Acute embolism and thrombosis of right tibial vein: Secondary | ICD-10-CM | POA: Diagnosis not present

## 2022-12-10 DIAGNOSIS — I110 Hypertensive heart disease with heart failure: Secondary | ICD-10-CM | POA: Diagnosis not present

## 2022-12-10 DIAGNOSIS — F05 Delirium due to known physiological condition: Secondary | ICD-10-CM | POA: Diagnosis not present

## 2022-12-10 DIAGNOSIS — J189 Pneumonia, unspecified organism: Secondary | ICD-10-CM | POA: Diagnosis not present

## 2022-12-10 DIAGNOSIS — J690 Pneumonitis due to inhalation of food and vomit: Secondary | ICD-10-CM | POA: Diagnosis not present

## 2022-12-10 DIAGNOSIS — D638 Anemia in other chronic diseases classified elsewhere: Secondary | ICD-10-CM | POA: Diagnosis not present

## 2022-12-10 DIAGNOSIS — R569 Unspecified convulsions: Secondary | ICD-10-CM | POA: Diagnosis not present

## 2022-12-10 DIAGNOSIS — E039 Hypothyroidism, unspecified: Secondary | ICD-10-CM | POA: Diagnosis not present

## 2022-12-10 DIAGNOSIS — R9089 Other abnormal findings on diagnostic imaging of central nervous system: Secondary | ICD-10-CM | POA: Diagnosis not present

## 2022-12-10 DIAGNOSIS — I2609 Other pulmonary embolism with acute cor pulmonale: Secondary | ICD-10-CM | POA: Diagnosis not present

## 2022-12-10 DIAGNOSIS — I5043 Acute on chronic combined systolic (congestive) and diastolic (congestive) heart failure: Secondary | ICD-10-CM | POA: Diagnosis not present

## 2022-12-10 DIAGNOSIS — R4182 Altered mental status, unspecified: Secondary | ICD-10-CM | POA: Diagnosis not present

## 2022-12-10 DIAGNOSIS — F0393 Unspecified dementia, unspecified severity, with mood disturbance: Secondary | ICD-10-CM | POA: Diagnosis not present

## 2022-12-10 DIAGNOSIS — I82451 Acute embolism and thrombosis of right peroneal vein: Secondary | ICD-10-CM | POA: Diagnosis not present

## 2022-12-10 DIAGNOSIS — N390 Urinary tract infection, site not specified: Secondary | ICD-10-CM | POA: Diagnosis not present

## 2022-12-10 DIAGNOSIS — I6381 Other cerebral infarction due to occlusion or stenosis of small artery: Secondary | ICD-10-CM | POA: Diagnosis not present

## 2022-12-10 DIAGNOSIS — I2699 Other pulmonary embolism without acute cor pulmonale: Secondary | ICD-10-CM | POA: Diagnosis not present

## 2022-12-10 DIAGNOSIS — R918 Other nonspecific abnormal finding of lung field: Secondary | ICD-10-CM | POA: Diagnosis not present

## 2022-12-10 DIAGNOSIS — I82411 Acute embolism and thrombosis of right femoral vein: Secondary | ICD-10-CM | POA: Diagnosis not present

## 2022-12-10 DIAGNOSIS — I2694 Multiple subsegmental pulmonary emboli without acute cor pulmonale: Secondary | ICD-10-CM | POA: Diagnosis not present

## 2022-12-10 DIAGNOSIS — R109 Unspecified abdominal pain: Secondary | ICD-10-CM | POA: Diagnosis not present

## 2022-12-10 DIAGNOSIS — J90 Pleural effusion, not elsewhere classified: Secondary | ICD-10-CM | POA: Diagnosis not present

## 2022-12-10 DIAGNOSIS — N179 Acute kidney failure, unspecified: Secondary | ICD-10-CM | POA: Diagnosis not present

## 2022-12-10 DIAGNOSIS — J9811 Atelectasis: Secondary | ICD-10-CM | POA: Diagnosis not present

## 2022-12-10 DIAGNOSIS — I5021 Acute systolic (congestive) heart failure: Secondary | ICD-10-CM | POA: Diagnosis not present

## 2022-12-10 DIAGNOSIS — K828 Other specified diseases of gallbladder: Secondary | ICD-10-CM | POA: Diagnosis not present

## 2022-12-10 DIAGNOSIS — R1011 Right upper quadrant pain: Secondary | ICD-10-CM | POA: Diagnosis not present

## 2022-12-10 DIAGNOSIS — G9341 Metabolic encephalopathy: Secondary | ICD-10-CM | POA: Diagnosis not present

## 2022-12-10 DIAGNOSIS — F32A Depression, unspecified: Secondary | ICD-10-CM | POA: Diagnosis not present

## 2022-12-10 DIAGNOSIS — Z8616 Personal history of COVID-19: Secondary | ICD-10-CM | POA: Diagnosis not present

## 2022-12-10 DIAGNOSIS — I4819 Other persistent atrial fibrillation: Secondary | ICD-10-CM | POA: Diagnosis not present

## 2022-12-10 DIAGNOSIS — K838 Other specified diseases of biliary tract: Secondary | ICD-10-CM | POA: Diagnosis not present

## 2022-12-10 DIAGNOSIS — I82431 Acute embolism and thrombosis of right popliteal vein: Secondary | ICD-10-CM | POA: Diagnosis not present

## 2022-12-10 DIAGNOSIS — E440 Moderate protein-calorie malnutrition: Secondary | ICD-10-CM | POA: Diagnosis not present

## 2022-12-10 HISTORY — DX: Sepsis, unspecified organism: A41.9

## 2022-12-10 LAB — CBC
HCT: 27.6 % — ABNORMAL LOW (ref 39.0–52.0)
Hemoglobin: 8.8 g/dL — ABNORMAL LOW (ref 13.0–17.0)
MCH: 30.9 pg (ref 26.0–34.0)
MCHC: 31.9 g/dL (ref 30.0–36.0)
MCV: 96.8 fL (ref 80.0–100.0)
Platelets: 274 10*3/uL (ref 150–400)
RBC: 2.85 MIL/uL — ABNORMAL LOW (ref 4.22–5.81)
RDW: 16.6 % — ABNORMAL HIGH (ref 11.5–15.5)
WBC: 9.5 10*3/uL (ref 4.0–10.5)
nRBC: 0 % (ref 0.0–0.2)

## 2022-12-10 LAB — I-STAT CHEM 8, ED
BUN: 23 mg/dL (ref 8–23)
Calcium, Ion: 0.7 mmol/L — CL (ref 1.15–1.40)
Chloride: 108 mmol/L (ref 98–111)
Creatinine, Ser: 1 mg/dL (ref 0.61–1.24)
Glucose, Bld: 173 mg/dL — ABNORMAL HIGH (ref 70–99)
HCT: 27 % — ABNORMAL LOW (ref 39.0–52.0)
Hemoglobin: 9.2 g/dL — ABNORMAL LOW (ref 13.0–17.0)
Potassium: 3.8 mmol/L (ref 3.5–5.1)
Sodium: 142 mmol/L (ref 135–145)
TCO2: 14 mmol/L — ABNORMAL LOW (ref 22–32)

## 2022-12-10 LAB — GLUCOSE, CAPILLARY
Glucose-Capillary: 113 mg/dL — ABNORMAL HIGH (ref 70–99)
Glucose-Capillary: 151 mg/dL — ABNORMAL HIGH (ref 70–99)
Glucose-Capillary: 160 mg/dL — ABNORMAL HIGH (ref 70–99)
Glucose-Capillary: 228 mg/dL — ABNORMAL HIGH (ref 70–99)
Glucose-Capillary: 254 mg/dL — ABNORMAL HIGH (ref 70–99)
Glucose-Capillary: 253 mg/dL — ABNORMAL HIGH (ref 70–99)

## 2022-12-10 LAB — I-STAT CG4 LACTIC ACID, ED
Lactic Acid, Venous: 2.8 mmol/L (ref 0.5–1.9)
Lactic Acid, Venous: 2.6 mmol/L (ref 0.5–1.9)
Lactic Acid, Venous: 1.2 mmol/L (ref 0.5–1.9)

## 2022-12-10 LAB — CBC WITH DIFFERENTIAL/PLATELET
Abs Immature Granulocytes: 0.08 10*3/uL — ABNORMAL HIGH (ref 0.00–0.07)
Basophils Absolute: 0 10*3/uL (ref 0.0–0.1)
Basophils Relative: 0 %
Eosinophils Absolute: 0 10*3/uL (ref 0.0–0.5)
Eosinophils Relative: 0 %
HCT: 34.7 % — ABNORMAL LOW (ref 39.0–52.0)
Hemoglobin: 10.7 g/dL — ABNORMAL LOW (ref 13.0–17.0)
Immature Granulocytes: 1 %
Lymphocytes Relative: 16 %
Lymphs Abs: 1.4 10*3/uL (ref 0.7–4.0)
MCH: 30.7 pg (ref 26.0–34.0)
MCHC: 30.8 g/dL (ref 30.0–36.0)
MCV: 99.4 fL (ref 80.0–100.0)
Monocytes Absolute: 0.6 10*3/uL (ref 0.1–1.0)
Monocytes Relative: 6 %
Neutro Abs: 6.8 10*3/uL (ref 1.7–7.7)
Neutrophils Relative %: 77 %
Platelets: 327 10*3/uL (ref 150–400)
RBC: 3.49 MIL/uL — ABNORMAL LOW (ref 4.22–5.81)
RDW: 16.8 % — ABNORMAL HIGH (ref 11.5–15.5)
WBC: 8.9 10*3/uL (ref 4.0–10.5)
nRBC: 0 % (ref 0.0–0.2)

## 2022-12-10 LAB — HEPARIN LEVEL (UNFRACTIONATED)
Heparin Unfractionated: >1.1 [IU]/mL — ABNORMAL HIGH (ref 0.30–0.70)
Heparin Unfractionated: >1.1 [IU]/mL — ABNORMAL HIGH (ref 0.30–0.70)

## 2022-12-10 LAB — URINALYSIS, W/ REFLEX TO CULTURE (INFECTION SUSPECTED)
Bilirubin Urine: NEGATIVE
Glucose, UA: NEGATIVE mg/dL
Hgb urine dipstick: NEGATIVE
Ketones, ur: 5 mg/dL — AB
Leukocytes,Ua: NEGATIVE
Nitrite: NEGATIVE
Protein, ur: 30 mg/dL — AB
Specific Gravity, Urine: 1.023 (ref 1.005–1.030)
pH: 5 (ref 5.0–8.0)

## 2022-12-10 LAB — AMMONIA: Ammonia: 10 umol/L (ref 9–35)

## 2022-12-10 LAB — MAGNESIUM
Magnesium: 1.1 mg/dL — ABNORMAL LOW (ref 1.7–2.4)
Magnesium: 2 mg/dL (ref 1.7–2.4)
Magnesium: 2.6 mg/dL — ABNORMAL HIGH (ref 1.7–2.4)
Magnesium: <0.5 mg/dL — CL (ref 1.7–2.4)

## 2022-12-10 LAB — ECHOCARDIOGRAM COMPLETE
AR max vel: 3.08 cm2
AV Peak grad: 5.1 mmHg
Ao pk vel: 1.13 m/s
Area-P 1/2: 4.8 cm2
Calc EF: 44.7 %
Height: 68 in
MV M vel: 4.28 m/s
MV Peak grad: 73.2 mmHg
P 1/2 time: 469 ms
S' Lateral: 4.5 cm
Single Plane A2C EF: 56.4 %
Single Plane A4C EF: 41.9 %
Weight: 2455.04 [oz_av]

## 2022-12-10 LAB — COMPREHENSIVE METABOLIC PANEL
ALT: 19 U/L (ref 0–44)
ALT: 16 U/L (ref 0–44)
AST: 28 U/L (ref 15–41)
AST: 24 U/L (ref 15–41)
Albumin: 2 g/dL — ABNORMAL LOW (ref 3.5–5.0)
Albumin: 1.8 g/dL — ABNORMAL LOW (ref 3.5–5.0)
Alkaline Phosphatase: 64 U/L (ref 38–126)
Alkaline Phosphatase: 61 U/L (ref 38–126)
Anion gap: 21 — ABNORMAL HIGH (ref 5–15)
Anion gap: 18 — ABNORMAL HIGH (ref 5–15)
BUN: 21 mg/dL (ref 8–23)
BUN: 14 mg/dL (ref 8–23)
CO2: 10 mmol/L — ABNORMAL LOW (ref 22–32)
CO2: 13 mmol/L — ABNORMAL LOW (ref 22–32)
Calcium: 6 mg/dL — CL (ref 8.9–10.3)
Calcium: 6 mg/dL — CL (ref 8.9–10.3)
Chloride: 108 mmol/L (ref 98–111)
Chloride: 110 mmol/L (ref 98–111)
Creatinine, Ser: 1.43 mg/dL — ABNORMAL HIGH (ref 0.61–1.24)
Creatinine, Ser: 1.16 mg/dL (ref 0.61–1.24)
GFR, Estimated: 49 mL/min — ABNORMAL LOW (ref >60)
GFR, Estimated: >60 mL/min (ref >60)
Glucose, Bld: 58 mg/dL — ABNORMAL LOW (ref 70–99)
Glucose, Bld: 159 mg/dL — ABNORMAL HIGH (ref 70–99)
Potassium: 3.9 mmol/L (ref 3.5–5.1)
Potassium: 3.6 mmol/L (ref 3.5–5.1)
Sodium: 139 mmol/L (ref 135–145)
Sodium: 141 mmol/L (ref 135–145)
Total Bilirubin: 1.3 mg/dL — ABNORMAL HIGH (ref 0.3–1.2)
Total Bilirubin: 1.3 mg/dL — ABNORMAL HIGH (ref 0.3–1.2)
Total Protein: 5.6 g/dL — ABNORMAL LOW (ref 6.5–8.1)
Total Protein: 4.6 g/dL — ABNORMAL LOW (ref 6.5–8.1)

## 2022-12-10 LAB — APTT: aPTT: 28 s (ref 24–36)

## 2022-12-10 LAB — TSH: TSH: 0.184 u[IU]/mL — ABNORMAL LOW (ref 0.350–4.500)

## 2022-12-10 LAB — RESP PANEL BY RT-PCR (RSV, FLU A&B, COVID)  RVPGX2
Influenza A by PCR: NEGATIVE
Influenza B by PCR: NEGATIVE
Resp Syncytial Virus by PCR: NEGATIVE
SARS Coronavirus 2 by RT PCR: NEGATIVE

## 2022-12-10 LAB — BASIC METABOLIC PANEL
Anion gap: 17 — ABNORMAL HIGH (ref 5–15)
Anion gap: 17 — ABNORMAL HIGH (ref 5–15)
BUN: 19 mg/dL (ref 8–23)
BUN: 13 mg/dL (ref 8–23)
CO2: 10 mmol/L — ABNORMAL LOW (ref 22–32)
CO2: 13 mmol/L — ABNORMAL LOW (ref 22–32)
Calcium: 5.5 mg/dL — CL (ref 8.9–10.3)
Calcium: 6.7 mg/dL — ABNORMAL LOW (ref 8.9–10.3)
Chloride: 113 mmol/L — ABNORMAL HIGH (ref 98–111)
Chloride: 108 mmol/L (ref 98–111)
Creatinine, Ser: 1.29 mg/dL — ABNORMAL HIGH (ref 0.61–1.24)
Creatinine, Ser: 1.17 mg/dL (ref 0.61–1.24)
GFR, Estimated: 55 mL/min — ABNORMAL LOW (ref >60)
GFR, Estimated: >60 mL/min (ref >60)
Glucose, Bld: 82 mg/dL (ref 70–99)
Glucose, Bld: 232 mg/dL — ABNORMAL HIGH (ref 70–99)
Potassium: 3 mmol/L — ABNORMAL LOW (ref 3.5–5.1)
Potassium: 4 mmol/L (ref 3.5–5.1)
Sodium: 140 mmol/L (ref 135–145)
Sodium: 138 mmol/L (ref 135–145)

## 2022-12-10 LAB — BRAIN NATRIURETIC PEPTIDE: B Natriuretic Peptide: 532.8 pg/mL — ABNORMAL HIGH (ref 0.0–100.0)

## 2022-12-10 LAB — ETHANOL: Alcohol, Ethyl (B): <10 mg/dL (ref <10)

## 2022-12-10 LAB — LACTIC ACID, PLASMA
Lactic Acid, Venous: 1.5 mmol/L (ref 0.5–1.9)
Lactic Acid, Venous: 1.8 mmol/L (ref 0.5–1.9)

## 2022-12-10 LAB — T4, FREE: Free T4: 1.43 ng/dL — ABNORMAL HIGH (ref 0.61–1.12)

## 2022-12-10 LAB — MRSA NEXT GEN BY PCR, NASAL: MRSA by PCR Next Gen: DETECTED — AB

## 2022-12-10 LAB — SALICYLATE LEVEL: Salicylate Lvl: <7 mg/dL — ABNORMAL LOW (ref 7.0–30.0)

## 2022-12-10 LAB — BETA-HYDROXYBUTYRIC ACID: Beta-Hydroxybutyric Acid: 2.97 mmol/L — ABNORMAL HIGH (ref 0.05–0.27)

## 2022-12-10 LAB — CORTISOL: Cortisol, Plasma: 94.2 ug/dL

## 2022-12-10 LAB — TROPONIN I (HIGH SENSITIVITY)
Troponin I (High Sensitivity): 62 ng/L — ABNORMAL HIGH (ref <18)
Troponin I (High Sensitivity): 67 ng/L — ABNORMAL HIGH (ref <18)

## 2022-12-10 LAB — OSMOLALITY: Osmolality: 299 mosm/kg — ABNORMAL HIGH (ref 275–295)

## 2022-12-10 LAB — CBG MONITORING, ED
Glucose-Capillary: 118 mg/dL — ABNORMAL HIGH (ref 70–99)
Glucose-Capillary: 46 mg/dL — ABNORMAL LOW (ref 70–99)
Glucose-Capillary: 74 mg/dL (ref 70–99)

## 2022-12-10 LAB — PROCALCITONIN: Procalcitonin: <0.1 ng/mL

## 2022-12-10 LAB — PROTIME-INR
INR: 1.6 — ABNORMAL HIGH (ref 0.8–1.2)
Prothrombin Time: 19.3 s — ABNORMAL HIGH (ref 11.4–15.2)

## 2022-12-10 LAB — PHOSPHORUS
Phosphorus: 3 mg/dL (ref 2.5–4.6)
Phosphorus: 2.7 mg/dL (ref 2.5–4.6)

## 2022-12-10 LAB — ACETAMINOPHEN LEVEL: Acetaminophen (Tylenol), Serum: <10 ug/mL — ABNORMAL LOW (ref 10–30)

## 2022-12-10 MED ORDER — FOLIC ACID 1 MG PO TABS
1.0000 mg | ORAL_TABLET | Freq: Every day | ORAL | Status: DC
  Administered 2022-12-11 – 2022-12-21 (×11): 1 mg via ORAL
  Filled 2022-12-10 (×11): qty 1

## 2022-12-10 MED ORDER — DOCUSATE SODIUM 100 MG PO CAPS: 100.0000 mg | ORAL_CAPSULE | Freq: Two times a day (BID) | ORAL | Status: DC | PRN

## 2022-12-10 MED ORDER — SODIUM BICARBONATE 8.4 % IV SOLN
100.0000 meq | Freq: Once | INTRAVENOUS | Status: AC
  Administered 2022-12-10: 100 meq via INTRAVENOUS

## 2022-12-10 MED ORDER — LACTATED RINGERS IV BOLUS
2000.0000 mL | Freq: Once | INTRAVENOUS | Status: AC
  Administered 2022-12-10: 2000 mL via INTRAVENOUS

## 2022-12-10 MED ORDER — THIAMINE MONONITRATE 100 MG PO TABS
100.0000 mg | ORAL_TABLET | Freq: Every day | ORAL | Status: DC
  Administered 2022-12-11 – 2022-12-21 (×11): 100 mg via ORAL
  Filled 2022-12-10 (×11): qty 1

## 2022-12-10 MED ORDER — VANCOMYCIN HCL IN DEXTROSE 1-5 GM/200ML-% IV SOLN: 1000.0000 mg | Freq: Once | INTRAVENOUS | Status: DC

## 2022-12-10 MED ORDER — POLYETHYLENE GLYCOL 3350 17 G PO PACK: 17.0000 g | PACK | Freq: Every day | ORAL | Status: DC | PRN

## 2022-12-10 MED ORDER — VANCOMYCIN HCL 2000 MG/400ML IV SOLN
2000.0000 mg | Freq: Once | INTRAVENOUS | Status: AC
  Administered 2022-12-10: 2000 mg via INTRAVENOUS
  Filled 2022-12-10: qty 400

## 2022-12-10 MED ORDER — VANCOMYCIN HCL IN DEXTROSE 1-5 GM/200ML-% IV SOLN
1000.0000 mg | INTRAVENOUS | Status: DC
  Administered 2022-12-11 – 2022-12-12 (×2): 1000 mg via INTRAVENOUS
  Filled 2022-12-10 (×2): qty 200

## 2022-12-10 MED ORDER — DEXTROSE 50 % IV SOLN
1.0000 | Freq: Once | INTRAVENOUS | Status: AC
  Administered 2022-12-10: 50 mL via INTRAVENOUS
  Filled 2022-12-10: qty 50

## 2022-12-10 MED ORDER — SODIUM CHLORIDE 0.9 % IV BOLUS
1000.0000 mL | Freq: Once | INTRAVENOUS | Status: AC
  Administered 2022-12-10: 1000 mL via INTRAVENOUS

## 2022-12-10 MED ORDER — LORAZEPAM 2 MG/ML IJ SOLN
0.5000 mg | Freq: Once | INTRAMUSCULAR | Status: AC
  Administered 2022-12-10: 0.5 mg via INTRAVENOUS
  Filled 2022-12-10: qty 1

## 2022-12-10 MED ORDER — POTASSIUM CHLORIDE 10 MEQ/100ML IV SOLN
10.0000 meq | INTRAVENOUS | Status: DC
  Administered 2022-12-10 (×3): 10 meq via INTRAVENOUS
  Filled 2022-12-10 (×4): qty 100

## 2022-12-10 MED ORDER — HEPARIN (PORCINE) 25000 UT/250ML-% IV SOLN: 1050.0000 [IU]/h | INTRAVENOUS | Status: DC

## 2022-12-10 MED ORDER — METRONIDAZOLE 500 MG/100ML IV SOLN
500.0000 mg | Freq: Once | INTRAVENOUS | Status: AC
  Administered 2022-12-10: 500 mg via INTRAVENOUS
  Filled 2022-12-10: qty 100

## 2022-12-10 MED ORDER — MAGNESIUM SULFATE 4 GM/100ML IV SOLN
4.0000 g | Freq: Once | INTRAVENOUS | Status: AC
  Administered 2022-12-10: 4 g via INTRAVENOUS

## 2022-12-10 MED ORDER — MUPIROCIN 2 % EX OINT
1.0000 | TOPICAL_OINTMENT | Freq: Two times a day (BID) | CUTANEOUS | Status: AC
  Administered 2022-12-10 – 2022-12-14 (×9): 1 via NASAL
  Filled 2022-12-10: qty 22

## 2022-12-10 MED ORDER — FOLIC ACID 1 MG PO TABS: 1.0000 mg | ORAL_TABLET | Freq: Every day | ORAL | Status: DC

## 2022-12-10 MED ORDER — SODIUM CHLORIDE 0.9 % IV SOLN
250.0000 mL | INTRAVENOUS | Status: DC
  Administered 2022-12-10: 250 mL via INTRAVENOUS

## 2022-12-10 MED ORDER — ORAL CARE MOUTH RINSE: 15.0000 mL | OROMUCOSAL | Status: DC | PRN

## 2022-12-10 MED ORDER — CALCIUM GLUCONATE-NACL 1-0.675 GM/50ML-% IV SOLN
1.0000 g | Freq: Once | INTRAVENOUS | Status: AC
  Administered 2022-12-10: 1000 mg via INTRAVENOUS
  Filled 2022-12-10: qty 50

## 2022-12-10 MED ORDER — HYDROCORTISONE SOD SUC (PF) 100 MG IJ SOLR
100.0000 mg | Freq: Two times a day (BID) | INTRAMUSCULAR | Status: DC
  Administered 2022-12-10 – 2022-12-12 (×5): 100 mg via INTRAVENOUS
  Filled 2022-12-10 (×5): qty 2

## 2022-12-10 MED ORDER — LEVOTHYROXINE SODIUM 100 MCG PO TABS
100.0000 ug | ORAL_TABLET | Freq: Every day | ORAL | Status: DC
  Administered 2022-12-12 – 2022-12-14 (×2): 100 ug via ORAL
  Filled 2022-12-10 (×2): qty 1

## 2022-12-10 MED ORDER — ASPIRIN 81 MG PO CHEW
81.0000 mg | CHEWABLE_TABLET | Freq: Every day | ORAL | Status: DC
  Administered 2022-12-11 – 2022-12-14 (×4): 81 mg via ORAL
  Filled 2022-12-10 (×4): qty 1

## 2022-12-10 MED ORDER — IOHEXOL 350 MG/ML SOLN
75.0000 mL | Freq: Once | INTRAVENOUS | Status: AC | PRN
  Administered 2022-12-10: 75 mL via INTRAVENOUS

## 2022-12-10 MED ORDER — STERILE WATER FOR INJECTION IV SOLN
INTRAVENOUS | Status: DC
  Filled 2022-12-10: qty 1000

## 2022-12-10 MED ORDER — NOREPINEPHRINE 4 MG/250ML-% IV SOLN
0.0000 ug/min | INTRAVENOUS | Status: DC
  Administered 2022-12-10: 14 ug/min via INTRAVENOUS
  Administered 2022-12-10: 10 ug/min via INTRAVENOUS
  Administered 2022-12-10: 5 ug/min via INTRAVENOUS
  Administered 2022-12-10: 20 ug/min via INTRAVENOUS
  Administered 2022-12-11 (×2): 6 ug/min via INTRAVENOUS
  Administered 2022-12-12: 3 ug/min via INTRAVENOUS
  Filled 2022-12-10 (×7): qty 250

## 2022-12-10 MED ORDER — HYDROCORTISONE SOD SUC (PF) 100 MG IJ SOLR
100.0000 mg | Freq: Once | INTRAMUSCULAR | Status: AC
  Administered 2022-12-10: 100 mg via INTRAVENOUS

## 2022-12-10 MED ORDER — MAGNESIUM SULFATE 2 GM/50ML IV SOLN
2.0000 g | Freq: Once | INTRAVENOUS | Status: AC
  Administered 2022-12-10: 2 g via INTRAVENOUS
  Filled 2022-12-10: qty 50

## 2022-12-10 MED ORDER — HEPARIN BOLUS VIA INFUSION
4000.0000 [IU] | Freq: Once | INTRAVENOUS | Status: AC
  Administered 2022-12-10: 4000 [IU] via INTRAVENOUS
  Filled 2022-12-10: qty 4000

## 2022-12-10 MED ORDER — THIAMINE MONONITRATE 100 MG PO TABS: 100.0000 mg | ORAL_TABLET | Freq: Every day | ORAL | Status: DC

## 2022-12-10 MED ORDER — LEVOTHYROXINE SODIUM 100 MCG PO TABS: 100.0000 ug | ORAL_TABLET | Freq: Every day | ORAL | Status: DC

## 2022-12-10 MED ORDER — POTASSIUM CHLORIDE 10 MEQ/100ML IV SOLN: 10.0000 meq | INTRAVENOUS | Status: DC

## 2022-12-10 MED ORDER — CHLORHEXIDINE GLUCONATE CLOTH 2 % EX PADS
6.0000 | MEDICATED_PAD | Freq: Every day | CUTANEOUS | Status: DC
  Administered 2022-12-10 – 2022-12-14 (×5): 6 via TOPICAL

## 2022-12-10 MED ORDER — CALCIUM GLUCONATE-NACL 1-0.675 GM/50ML-% IV SOLN
2.0000 g | Freq: Once | INTRAVENOUS | Status: AC
  Administered 2022-12-10: 2000 mg via INTRAVENOUS
  Filled 2022-12-10: qty 100

## 2022-12-10 MED ORDER — POTASSIUM CHLORIDE 10 MEQ/100ML IV SOLN
10.0000 meq | INTRAVENOUS | Status: AC
  Administered 2022-12-10 (×4): 10 meq via INTRAVENOUS
  Filled 2022-12-10 (×3): qty 100

## 2022-12-10 MED ORDER — FOLIC ACID 5 MG/ML IJ SOLN
1.0000 mg | Freq: Every day | INTRAMUSCULAR | Status: DC
  Filled 2022-12-10: qty 0.2

## 2022-12-10 MED ORDER — PANTOPRAZOLE SODIUM 40 MG IV SOLR
40.0000 mg | INTRAVENOUS | Status: DC
  Administered 2022-12-10 – 2022-12-14 (×5): 40 mg via INTRAVENOUS
  Filled 2022-12-10 (×5): qty 10

## 2022-12-10 MED ORDER — ORAL CARE MOUTH RINSE
15.0000 mL | OROMUCOSAL | Status: DC
  Administered 2022-12-10 – 2022-12-12 (×9): 15 mL via OROMUCOSAL

## 2022-12-10 MED ORDER — VASOPRESSIN 20 UNITS/100 ML INFUSION FOR SHOCK: 0.0000 [IU]/min | INTRAVENOUS | Status: DC

## 2022-12-10 MED ORDER — HYDROCORTISONE SOD SUC (PF) 100 MG IJ SOLR: 100.0000 mg | Freq: Three times a day (TID) | INTRAMUSCULAR | Status: DC

## 2022-12-10 MED ORDER — ASPIRIN 81 MG PO CHEW: 81.0000 mg | CHEWABLE_TABLET | Freq: Every day | ORAL | Status: DC

## 2022-12-10 MED ORDER — THIAMINE HCL 100 MG/ML IJ SOLN: 100.0000 mg | Freq: Every day | INTRAMUSCULAR | Status: DC

## 2022-12-10 MED ORDER — HEPARIN (PORCINE) 25000 UT/250ML-% IV SOLN
1300.0000 [IU]/h | INTRAVENOUS | Status: DC
  Administered 2022-12-10: 1300 [IU]/h via INTRAVENOUS
  Filled 2022-12-10: qty 250

## 2022-12-10 MED ORDER — CALCIUM GLUCONATE-NACL 1-0.675 GM/50ML-% IV SOLN
1.0000 g | Freq: Once | INTRAVENOUS | Status: AC
  Administered 2022-12-10: 1000 mg via INTRAVENOUS

## 2022-12-10 MED ORDER — MAGNESIUM SULFATE 2 GM/50ML IV SOLN: 2.0000 g | Freq: Once | INTRAVENOUS | Status: DC

## 2022-12-10 MED ORDER — SODIUM CHLORIDE 0.9 % IV SOLN
2.0000 g | Freq: Once | INTRAVENOUS | Status: AC
  Administered 2022-12-10: 2 g via INTRAVENOUS
  Filled 2022-12-10: qty 12.5

## 2022-12-10 MED ORDER — SODIUM CHLORIDE 0.9 % IV SOLN
4.0000 g | Freq: Once | INTRAVENOUS | Status: AC
  Administered 2022-12-10: 4 g via INTRAVENOUS
  Filled 2022-12-10: qty 40

## 2022-12-10 MED ORDER — PIPERACILLIN-TAZOBACTAM 3.375 G IVPB
3.3750 g | Freq: Three times a day (TID) | INTRAVENOUS | Status: DC
  Filled 2022-12-10 (×2): qty 50

## 2022-12-10 MED ORDER — SODIUM BICARBONATE 8.4 % IV SOLN
INTRAVENOUS | Status: DC
  Filled 2022-12-10 (×3): qty 1000
  Filled 2022-12-10: qty 150

## 2022-12-10 MED ORDER — PIPERACILLIN-TAZOBACTAM 3.375 G IVPB
3.3750 g | Freq: Three times a day (TID) | INTRAVENOUS | Status: AC
  Administered 2022-12-10 – 2022-12-15 (×16): 3.375 g via INTRAVENOUS
  Filled 2022-12-10 (×18): qty 50

## 2022-12-10 NOTE — Progress Notes (Signed)
ED Pharmacy Antibiotic Sign Off An antibiotic consult was received from an ED provider for cefepime and vancomycin per pharmacy dosing for sepsis of unknown origin. A chart review was completed to assess appropriateness.   The following one time order(s) were placed:  Cefepime 2g x 1 Vancomycin 2000mg  x 1   Further antibiotic and/or antibiotic pharmacy consults should be ordered by the admitting provider if indicated.   Thank you for allowing pharmacy to be a part of this patient's care.   Marja Kays, [facility]  Clinical Pharmacist 12/10/22 3:09 AM

## 2022-12-10 NOTE — Progress Notes (Addendum)
ANTICOAGULATION CONSULT NOTE  Pharmacy Consult for IV heparin Indication: pulmonary embolus and DVT  Allergies  Allergen Reactions   Procardia [Nifedipine] Other (See Comments)    Lowers bp    Phenergan [Promethazine Hcl] Nausea And Vomiting    Patient Measurements: Height: 5\' 8"  (172.7 cm) Weight: 69.6 kg (153 lb 7 oz) IBW/kg (Calculated) : 68.4 Heparin Dosing Weight: 70 kg  Vital Signs: Temp: 97.4 F (36.3 C) (09/12 2000) Temp Source: Oral (09/12 2000) Pulse Rate: 96 (09/12 2300)  Labs: Recent Labs    12/09/22 2331 12/10/22 0159 12/10/22 0300 12/10/22 0829 12/10/22 1300 12/10/22 2218  HGB 10.7* 9.2*  --  8.8*  --   --   HCT 34.7* 27.0*  --  27.6*  --   --   PLT 327  --   --  274  --   --   APTT  --   --  28  --   --   --   LABPROT  --   --  19.3*  --   --   --   INR  --   --  1.6*  --   --   --   HEPARINUNFRC  --   --   --   --  >1.10* >1.10*  CREATININE 1.43* 1.00 1.29* 1.16 1.17  --   TROPONINIHS 67*  --  62*  --   --   --     Estimated Creatinine Clearance: 46.3 mL/min (by C-G formula based on SCr of 1.17 mg/dL).   Assessment: Bradley Oconnor is a 82 y.o. year old male admitted on 12/09/2022 with R PE and DVT.  Pharmacy consulted to dose heparin.  Heparin level remains supra-therapeutic at > 1.1 units/mL on 1050 units/hr.  Lab obtained appropriately per discussion with RN - obtained from A-line in arm opposite where heparin running.  Pt with some bleeding from A-line.  Goal of Therapy:  Heparin level 0.3-0.7 units/ml Monitor platelets by anticoagulation protocol: Yes   Plan:  Hold heparin x 1 hr Since heparin level remains >1.1 with rate decreased, will get heparin level 1 hour post heparin stop to make sure heparin level down before restarting gtt.  Bradley Oconnor, PharmD, BCPS Please see amion for complete clinical pharmacist phone list 12/10/2022, 11:11 PM  ADDENDUM (0110): Heparin level down to 0.82 - still slightly supratherapeutic but coming  down after heparin held x 1 hour.  Plan: Will restart heparin at decreased rate of 800 units/hr and f/u 8 hr heparin level.  Bradley Oconnor, PharmD, BCPS Please see amion for complete clinical pharmacist phone list 12/11/2022 1:07 AM

## 2022-12-10 NOTE — ED Provider Notes (Signed)
Received at shift change from [place], PA-C please see note for full detail  Patient with medical history including CAD, hypertension, GERD, dementia, bedbound, presenting altered mental status, unable to obtain information from patient but spoke with patient's brother states that he has been confused/disoriented for last 3 days, states that typically he can hold a conversation and will be mildly confused but he has been more confused and more combative lately.  He states that they are concerned for possible UTI and was started on Cipro, he is unsure whether or not they got a urine on this patient's.  He states that he is a full code.  He also notes that patient did have a diarrheal illness a week prior but has since resolved.   Physical Exam  BP (!) 96/56   Pulse (!) 112   Temp 98.1 F (36.7 C)   Resp (!) 24   Ht 5\' 8"  (1.727 m)   Wt 81.5 kg   SpO2 99%   BMI 27.32 kg/m   Physical Exam Vitals and nursing note reviewed.  Constitutional:      General: He is in acute distress.     Appearance: He is ill-appearing.  HENT:     Head: Normocephalic and atraumatic.     Nose: No congestion.     Mouth/Throat:     Mouth: Mucous membranes are dry.     Pharynx: Oropharynx is clear.  Eyes:     Extraocular Movements: Extraocular movements intact.     Conjunctiva/sclera: Conjunctivae normal.     Pupils: Pupils are equal, round, and reactive to light.  Cardiovascular:     Rate and Rhythm: Tachycardia present. Rhythm irregular.     Pulses: Normal pulses.     Heart sounds: No murmur heard.    No friction rub. No gallop.  Pulmonary:     Effort: No respiratory distress.     Breath sounds: No wheezing, rhonchi or rales.  Abdominal:     Palpations: Abdomen is soft.     Tenderness: There is no abdominal tenderness. There is no right CVA tenderness or left CVA tenderness.  Musculoskeletal:     Right lower leg: No edema.     Left lower leg: No edema.  Skin:    General: Skin is warm and  dry.  Neurological:     Mental Status: He is alert.     Comments: Alert and orient x 2, no facial asymmetry, no slurring of his words, can follow two-step commands, no unilateral weakness present my examination.  Psychiatric:        Mood and Affect: Mood normal.     Procedures  .Critical Care  Performed by: Carroll Sage, PA-C Authorized by: Carroll Sage, PA-C   Critical care provider statement:    Critical care time (minutes):  90   Critical care time was exclusive of:  Separately billable procedures and treating other patients   Critical care was necessary to treat or prevent imminent or life-threatening deterioration of the following conditions:  Circulatory failure and metabolic crisis   Critical care was time spent personally by me on the following activities:  Development of treatment plan with patient or surrogate, discussions with consultants, evaluation of patient's response to treatment, examination of patient, ordering and review of laboratory studies, ordering and review of radiographic studies, ordering and performing treatments and interventions, pulse oximetry, re-evaluation of patient's condition and review of old charts   I assumed direction of critical care for this patient from another  provider in my specialty: no     Care discussed with: admitting provider     ED Course / MDM    Medical Decision Making Amount and/or Complexity of Data Reviewed Labs: ordered. Radiology: ordered.  Risk Prescription drug management. Decision regarding hospitalization.    Lab Tests:  I Ordered, and personally interpreted labs.  The pertinent results include: CBC shows normocytic anemia hemoglobin 10.7, CMP reveals CO2 of 10 glucose 58 creatinine 1.43 calcium 6, albumin 2, T. bili 1.3 GFR 49 anion gap 21 initial lactic was 3.3 initial troponin 62 magnesium less than 0.5   Imaging Studies ordered:  I ordered imaging studies including CT head, chest x-ray, CT angio  chest, CT ab pelvis, limited ultrasound I independently visualized and interpreted imaging which showed x-ray of the chest was unremarkable, CT head was unremarkable, CT imaging of the chest and abdomen reveal pulmonary embolism in the right lung without heart strain, there is gallbladder distention with multiple stones without gallbladder wall thickening, there is a DVT noted in the right femoral vein. I agree with the radiologist interpretation   Cardiac Monitoring:  The patient was maintained on a cardiac monitor.  I personally viewed and interpreted the cardiac monitored which showed an underlying rhythm of: Atrial fibrillation   Medicines ordered and prescription drug management:  I ordered medication including fluids I have reviewed the patients home medicines and have made adjustments as needed  Critical Interventions:  Patient has elevated lactic acid and is tachycardic meets SIRS criteria will start on broad-spectrum antibiotics fluid resuscitation 30 mg/kg Repeat labs confirm electrolyte abnormality, will provide patient with IV magnesium and calcium, will add on  phosphorus. Despite 3 L of intravenous fluids patient still remains hypotensive, will start on nor epi CT imaging reveals right-sided PE without heart strain will start on heparin Patient was reassessed after electrolyte supplementation as well as starting on nor epi patient remained stable   Reevaluation:  On My initial evaluation patient was slightly altered A&O x 2 without any focal deficits, sepsis lab workup is pending, remains in A-fib with low BP, will provide fluids and continue to monitor  CMP showing significant electrolyte abnormality, calcium of 6.0, hypoalbuminemia, with a magnesium of less than 0.5, unclear if this erroneous, will repeat lab workup.  Patient has a noted increase troponin of 62, EKG without signs of ischemia, concern possibly for PE causing atrial fibrillation, patient also has  significant electrolyte abnormalities concern for intra-abdominal abnormality, will obtain CTA of chest as well as CT on pelvis for further evaluation  Will consult with critical care for admission  Consultations Obtained:  I requested consultation with the Dr. Marchelle Gearing,  and discussed lab and imaging findings as well as pertinent plan - they recommend: He will admit the patient    Test Considered:  I considered cardial conversion as patient was in A-fib and hypotensive but this was deferred at his unclear of when he went into atrial fibrillation, concerned that immediate reversal into normal sinus could cause a stroke.     Rule out Suspicion for intracranial head bleed is low at this time no focal deficits, CT head is negative, he was altered but is likely driven by significant metabolic abnormalities.   suspicion for pneumonia is low at this time as he was afebrile, no leukocytosis, patient had no URI-like symptoms on my examination.  There is questionable atelectasis versus pleural effusion versus underlying faction seen on imaging suspect likely both atelectasis as well as fluid as patient  is bedbound likely has chronic atelectasis and lower lung base as pleural effusion from recent fluid resuscitation.  Suspicion for ACS is low at this time there is no ST elevation depression, patient did have an elevated troponin but I suspect this is secondary from demand ischemia from A-fib RVR.  I suspicion for cholecystitis/cholangitis is lower at this time as she had no right upper quadrant tenderness, there is no elevation in liver enzymes or alk phos, there is only very minimal elevation in T. bili.    Dispostion and problem list  After consideration of the diagnostic results and the patients response to treatment, I feel that the patent would benefit from admission.  Right lung PE-currently on heparin, patiently continue monitoring. Significant electrolyte abnormality-unclear etiology,  suspect poor oral intake as she is very dry on my exam, as well as GI loss patient had episode of diarrhea for few days..  Patient will need continued electrolyte supplementation. Distended gallbladded-Limited has been ordered, once patient stabilized likely benefit from general surgery consultation.          Carroll Sage, PA-C 12/10/22 0557    Nira Conn, MD 12/10/22 [phone]

## 2022-12-10 NOTE — Procedures (Signed)
Patient Name: Bradley Oconnor  MRN: [id number]  Epilepsy Attending: Charlsie Quest  Referring Physician/Provider: Kalman Shan, MD  Date: 12/10/2022 Duration: 26.13 mins  Patient history: 82yo M with ams getting eeg to evaluate for seizure.  Level of alertness: Awake, asleep  AEDs during EEG study: Ativan  Technical aspects: This EEG study was done with scalp electrodes positioned according to the 10-20 International system of electrode placement. Electrical activity was reviewed with band pass filter of 1-70Hz , sensitivity of 7 uV/mm, display speed of 47mm/sec with a 60Hz  notched filter applied as appropriate. EEG data were recorded continuously and digitally stored.  Video monitoring was available and reviewed as appropriate.  Description: The posterior dominant rhythm consists of 7.5 Hz activity of moderate voltage (25-35 uV) seen predominantly in posterior head regions, symmetric and reactive to eye opening and eye closing. Sleep was characterized by vertex waves, sleep spindles (12 to 14 Hz), maximal frontocentral region. EEG showed intermittent generalized 3 to 6 Hz theta-delta slowing. Hyperventilation and photic stimulation were not performed.     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 No seizures or epileptiform discharges were seen throughout the recording.  Kaitlyne Friedhoff Annabelle Harman

## 2022-12-10 NOTE — Progress Notes (Signed)
Patient seen, examined and chart reviewed  In brief 82 year old male who was admitted earlier this morning with septic shock likely in the setting of aspiration pneumonia with multiple PEs on heparin infusion  Patient continued to require vasopressor support with Levophed, central line and arterial line was placed this morning for close monitoring and administration of vasopressors   Physical exam: General: Acute on chronically ill-appearing male, lying on the bed HEENT: [place]/AT, eyes anicteric.  Dry mucus membranes Neuro: Opens eyes with vocal stimuli, following simple commands, mumbling words Chest: Bilateral basal crackles, no wheezes or rhonchi Heart: Regular rate and rhythm, no murmurs or gallops Abdomen: Soft, nontender, nondistended, bowel sounds present Skin: No rash  Labs and images reviewed  Assessment plan: Severe sepsis with septic shock due to bilateral lower lobe pneumonia, POA Acute septic encephalopathy Acute pulmonary emboli Acute right lower extremity DVT involving common femoral vein Severe high anion gap metabolic acidosis Hypokalemia/hypomagnesemia/hypocalcemia Paroxysmal A-fib Coronary artery disease Acute kidney injury due to septic ATN Anemia of critical illness  Continue antibiotics Continue vasopressor support with MAP of 65 Avoid sedation, mental status is improving Continue IV heparin infusion, switch to DOAC once he is clinically stable Continue bicarbonate infusion Trend ABGs Lactate normalized Continue aggressive electrolyte replacement Went back to sinus rhythm with few PVCs likely related to electrolyte abnormalities and acidosis Will resume aspirin once he start taking oral meds Serum creatinine started improving Monitor H&H   The patient is critically ill due to septic shock.  Critical care was necessary to treat or prevent imminent or life-threatening deterioration.  Critical care was time spent personally by me on the following  activities: development of treatment plan with patient and/or surrogate as well as nursing, discussions with consultants, evaluation of patient's response to treatment, examination of patient, obtaining history from patient or surrogate, ordering and performing treatments and interventions, ordering and review of laboratory studies, ordering and review of radiographic studies, pulse oximetry, re-evaluation of patient's condition and participation in multidisciplinary rounds.   During this encounter critical care time was devoted to patient care services described in this note for 37 minutes.     Cheri Fowler, MD [facility] See Amion for pager If no response to pager, please call [phone] until 7pm After 7pm, Please call E-link [phone]

## 2022-12-10 NOTE — Progress Notes (Signed)
Echocardiogram 2D Echocardiogram has been performed.  Bradley Oconnor 12/10/2022, 11:57 AM

## 2022-12-10 NOTE — Procedures (Signed)
Arterial Catheter Insertion Procedure Note  Bradley Oconnor  [id number]  [birth year]  Date:12/10/22  Time:10:52 AM    Provider Performing: Cheri Fowler    Procedure: Insertion of Arterial Line (14782) with US guidance (95621)   Indication(s) Blood pressure monitoring and/or need for frequent ABGs  Consent Risks of the procedure as well as the alternatives and risks of each were explained to the patient and/or caregiver.  Consent for the procedure was obtained and is signed in the bedside chart  Anesthesia None   Time Out Verified patient identification, verified procedure, site/side was marked, verified correct patient position, special equipment/implants available, medications/allergies/relevant history reviewed, required imaging and test results available.   Sterile Technique Maximal sterile technique including full sterile barrier drape, hand hygiene, sterile gown, sterile gloves, mask, hair covering, sterile ultrasound probe cover (if used).   Procedure Description Area of catheter insertion was cleaned with chlorhexidine and draped in sterile fashion. With real-time ultrasound guidance an arterial catheter was placed into the left  Axillary  artery.  Appropriate arterial tracings confirmed on monitor.     Complications/Tolerance None; patient tolerated the procedure well.   EBL Minimal   Specimen(s) None

## 2022-12-10 NOTE — ED Notes (Signed)
Spoke with Dr Marchelle Gearing about critical hgb that came back of 5.6 and requested a recollect.  Called ICU RN to let them know.

## 2022-12-10 NOTE — Progress Notes (Signed)
Pharmacy Antibiotic Note  Bradley Oconnor is a 82 y.o. male admitted on 12/09/2022 with septic shock. Prior to admission, pt was started on ciprofloxacin for UTI. CT abd/pelv noted to have mild gallbladder wall thickening, possible infectious or inflammatory cystitis. WBC wnl and afebrile. Vancomycin, flagyl and cefepime x 1 in ED. Also requiring pressor support with norepinephrine. Pharmacy has been consulted for zosyn and vancomycin dosing.  Plan: Zosyn 3.375g q8h Vancomycin 1000mg  q24h (eAUC 434, scr 1.29) F/u renal function, micro data, and narrow as able Vancomycin level as needed  Height: 5\' 8"  (172.7 cm) Weight: 81.5 kg (179 lb 10.8 oz) IBW/kg (Calculated) : 68.4  Temp (24hrs), Avg:98.4 F (36.9 C), Min:97.4 F (36.3 C), Max:99.8 F (37.7 C)  Recent Labs  Lab 12/09/22 2331 12/09/22 2339 12/10/22 0159 12/10/22 0200 12/10/22 0300 12/10/22 0307  WBC 8.9  --   --   --   --   --   CREATININE 1.43*  --  1.00  --  1.29*  --   LATICACIDVEN  --  3.3*  --  2.8*  --  2.6*    Estimated Creatinine Clearance: 42 mL/min (A) (by C-G formula based on SCr of 1.29 mg/dL (H)).    Allergies  Allergen Reactions   Procardia [Nifedipine] Other (See Comments)    Lowers bp    Phenergan [Promethazine Hcl] Nausea And Vomiting    Antimicrobials this admission: Cefepime 9/12 x 1 Flagyl 9/12 x 1  Vancomycin 9/12 > Zosyn 9/12>  Microbiology results: 9/12 resp panel: neg 9/12 bcx: pending 9/12 Ucx: pending  Thank you for allowing pharmacy to be a part of this patient's care.  Marja Kays 12/10/2022 5:17 AM

## 2022-12-10 NOTE — Progress Notes (Signed)
eLink Physician-Brief Progress Note Patient Name: Bradley Oconnor DOB: December 16, 1940 MRN: [id number]   Date of Service  12/10/2022  HPI/Events of Note  Patient admitted with sepsis, shock, encephalopathy, generalized weakness of unclear etiology, work up is in progress.  eICU Interventions  New Patient Evaluation.        Thomasene Lot Oshea Percival 12/10/2022, 6:27 AM

## 2022-12-10 NOTE — Procedures (Signed)
Central Venous Catheter Insertion Procedure Note  Bradley Oconnor  [id number]  [birth year]  Date:12/10/22  Time:10:51 AM   Provider Performing:Airam Runions   Procedure: Insertion of Non-tunneled Central Venous [phone]) with US guidance ([id number])   Indication(s) Medication administration  Consent Risks of the procedure as well as the alternatives and risks of each were explained to the patient and/or caregiver.  Consent for the procedure was obtained and is signed in the bedside chart  Anesthesia Topical only with 1% lidocaine   Timeout Verified patient identification, verified procedure, site/side was marked, verified correct patient position, special equipment/implants available, medications/allergies/relevant history reviewed, required imaging and test results available.  Sterile Technique Maximal sterile technique including full sterile barrier drape, hand hygiene, sterile gown, sterile gloves, mask, hair covering, sterile ultrasound probe cover (if used).  Procedure Description Area of catheter insertion was cleaned with chlorhexidine and draped in sterile fashion.  With real-time ultrasound guidance a central venous catheter was placed into the left subclavian vein. Nonpulsatile blood flow and easy flushing noted in all ports.  The catheter was sutured in place and sterile dressing applied.  Complications/Tolerance None; patient tolerated the procedure well. Chest X-ray is ordered to verify placement for internal jugular or subclavian cannulation.   Chest x-ray is not ordered for femoral cannulation.  EBL Minimal  Specimen(s) None

## 2022-12-10 NOTE — Progress Notes (Signed)
EEG complete - results pending 

## 2022-12-10 NOTE — Progress Notes (Signed)
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 Will continue to follow for discharge disposition.  Please call with any [facility] related questions or concerns.  Thank you, Glenna Fellows [facility] Liaison [phone]

## 2022-12-10 NOTE — Plan of Care (Signed)

## 2022-12-10 NOTE — H&P (Signed)
NAME:  Bradley Oconnor, MRN:  [id number], DOB:  May 29, 1940, LOS: 0 ADMISSION DATE:  12/09/2022, CONSULTATION DATE:  12/10/22 REFERRING MD:  EDP, CHIEF COMPLAINT:  Acute ecne[phalopahty, Hypotension. 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  Neurological but a decline neurology visit in summer 2023].  Other medical problems include history of anemia, coronary artery disease, acid reflux, hyperlipidemia.  At some point in the last he has become completely bedbound although neurologically intact and oriented.  He is able to self feed.  Her most recent ER visit is August 2024 for dark urine and possible UTI.  He has a caretaker in his house and has episodes of incontinence but was able to converse and is mostly bedbound.  Then presented 12/09/2022 with history of confusion for the last 3-5 days and increased confusion on 12/09/2022 with a history of starting ciprofloxacin 2 days prior to presentation for possible UTI.  In the ED blood pressures were soft but despite 3 L of fluid required vasopressors Levophed through peripheral line.  Hypotension got worse after giving Ativan for some restlessness prior to CT scan.  Had significant lab abnormalities and this includes mild hypokalemia at presentation associated with severe low bicarbonate of 10 suggestive of metabolic acidosis, severe hypocalcemia [6 mg albumin uncorrected], elevated anion gap of 21 [associated albumin 2.0] , severe hypomagnesemia less than 0.5 mg percent, lactic acidosis 3.3 mg percent upon presentation but also ongoing mild anemia but normal white count  Urinalysis with positive urine ketones but no white cells  CT head normal  Last echocardiogram 2018 with grade 1  diastolic dysfunction.  Whole-body CT scan with right lower lobe distal PE without RV strain and right lower extremity DVT.  Social: wife died recently   Past Medical History:    has a past medical history of Blood transfusion without reported diagnosis, CAD (coronary artery disease), GERD (gastroesophageal reflux disease), Heart attack (HCC), Hyperlipidemia, Hypertension (03/07/12), and Thyroid disease.   reports that he has never smoked. He has never used smokeless tobacco.  Past Surgical History:  Procedure Laterality Date   APPENDECTOMY     CERVICAL SPINE SURGERY     titanium plate in the back of neck   CORONARY ARTERY BYPASS GRAFT     HERNIA REPAIR     SMALL INTESTINE SURGERY     THYROID SURGERY     1/2 thyroid removed on right side    Allergies  Allergen Reactions   Procardia [Nifedipine] Other (See Comments)    Lowers bp    Phenergan [Promethazine Hcl] Nausea And Vomiting    Immunization History  Administered Date(s) Administered   Fluad Quad(high Dose 65+) 03/17/2019, 01/17/2020, 02/27/2021   Influenza Split 12/09/2011   Influenza, High Dose Seasonal PF 02/10/2018   Influenza,inj,Quad PF,6+ Mos 02/01/2013, 02/01/2014, 01/17/2015, 12/17/2015, 12/31/2016   PFIZER(Purple Top)SARS-COV-2 Vaccination 06/13/2019, 07/04/2019, 03/03/2021   PNEUMOCOCCAL CONJUGATE-20 02/27/2021   Pneumococcal Polysaccharide-23 04/17/2014   Tdap 06/09/2019    Family History  Problem Relation Age of Onset   Heart disease Mother    Cancer Mother        breast, stomach   Heart disease Father    Hyperlipidemia Father    Heart disease Brother  Diabetes Brother    Hyperlipidemia Brother    Heart disease Paternal Grandfather    Healthy Brother      Current Facility-Administered Medications:    0.9 %  sodium chloride infusion, 250 mL, Intravenous, Continuous, Payne, John D, PA-C   docusate sodium (COLACE) capsule 100 mg, 100 mg, Oral, BID PRN, Pia Mau D, PA-C   heparin ADULT  infusion 100 units/mL (25000 units/272mL), 1,300 Units/hr, Intravenous, Continuous, Cardama, Amadeo Garnet, MD, Last Rate: 13 mL/hr at 12/10/22 0439, 1,300 Units/hr at 12/10/22 0439   magnesium sulfate IVPB 4 g 100 mL, 4 g, Intravenous, Once, Carroll Sage, PA-C, Last Rate: 50 mL/hr at 12/10/22 0325, 4 g at 12/10/22 0325   norepinephrine (LEVOPHED) 4mg  in (0.016 mg/mL) premix infusion, 0-40 mcg/min, Intravenous, Titrated, Carroll Sage, PA-C, Last Rate: 56.3 mL/hr at 12/10/22 0501, 15 mcg/min at 12/10/22 0501   polyethylene glycol (MIRALAX / GLYCOLAX) packet 17 g, 17 g, Oral, Daily PRN, Suzie Portela, John D, PA-C   sodium bicarbonate 150 mEq in sterile water 1,150 mL infusion, , Intravenous, Continuous, Payne, John D, PA-C   vancomycin (VANCOREADY) IVPB 2000 mg/400 mL, 2,000 mg, Intravenous, Once, Cardama, Amadeo Garnet, MD, Last Rate: 200 mL/hr at 12/10/22 0328, 2,000 mg at 12/10/22 0328  Current Outpatient Medications:    amLODipine (NORVASC) 5 MG tablet, Take 1 tablet (5 mg total) by mouth daily., Disp: 30 tablet, Rfl: 0   aspirin EC 81 MG tablet, Take 1 tablet (81 mg total) by mouth daily., Disp: 90 tablet, Rfl: 3   dextromethorphan-guaiFENesin (MUCINEX DM) 30-600 MG 12hr tablet, Take 1 tablet by mouth 2 (two) times daily., Disp: 20 tablet, Rfl: 0   esomeprazole (NEXIUM) 20 MG capsule, Take 20 mg by mouth daily at 12 noon., Disp: , Rfl:    fish oil-omega-3 fatty acids 1000 MG capsule, Take 1 g by mouth daily. , Disp: , Rfl:    fluticasone (FLONASE) 50 MCG/ACT nasal spray, Place 2 sprays into both nostrils daily., Disp: 16 g, Rfl: 6   Garlic 1000 MG CAPS, Take 1 capsule by mouth daily. , Disp: , Rfl:    hydrochlorothiazide (MICROZIDE) 12.5 MG capsule, TAKE 1 CAPSULE BY MOUTH ONCE DAILY . APPOINTMENT REQUIRED FOR FUTURE REFILLS, Disp: 90 capsule, Rfl: 0   isosorbide mononitrate (IMDUR) 120 MG 24 hr tablet, Take 1 tablet by mouth once daily, Disp: 90 tablet, Rfl: 0   lansoprazole  (PREVACID) 15 MG capsule, Take 15 mg by mouth daily at 12 noon., Disp: , Rfl:    levothyroxine (SYNTHROID) 100 MCG tablet, TAKE 1 TABLET BY MOUTH ONCE DAILY IN THE MORNING -  NEED  OFFICE  VISIT  WITH  DR.  Tresa Endo  FOR  FUTURE  REFILLS, Disp: 90 tablet, Rfl: 0   metoprolol succinate (TOPROL-XL) 100 MG 24 hr tablet, TAKE 1 TABLET BY MOUTH ONCE DAILY . APPOINTMENT REQUIRED FOR FUTURE REFILLS, Disp: 90 tablet, Rfl: 0   nitroGLYCERIN (NITROSTAT) 0.4 MG SL tablet, DISSOLVE ONE TABLET UNDER THE TONGUE EVERY 5 MINUTES AS NEEDED FOR CHEST PAIN.  DO NOT EXCEED A TOTAL OF 3 DOSES IN 15 MINUTES, Disp: 25 tablet, Rfl: 3   potassium chloride SA (KLOR-CON M) 20 MEQ tablet, Take 1 tablet (20 mEq total) by mouth daily for 5 days., Disp: 5 tablet, Rfl: 0   ramipril (ALTACE) 10 MG capsule, Take 1 capsule (10 mg total) by mouth daily., Disp: 90 capsule, Rfl: 3   ranolazine (RANEXA) 1000 MG SR tablet, TAKE 1  BY  MOUTH TWICE DAILY, Disp: 180 tablet, Rfl: 0   rosuvastatin (CRESTOR) 20 MG tablet, TAKE 1 TABLET BY MOUTH ONCE DAILY IN THE EVENING, Disp: 90 tablet, Rfl: 0     Significant Hospital Events:  12/09/2022 - admit  Interim History / Subjective:   12/10/2022 - seen in [facility] ED 27  Objective   Blood pressure 116/69, pulse 79, temperature 98.1 F (36.7 C), resp. rate (!) 39, height 5\' 8"  (1.727 m), weight 81.5 kg, SpO2 (!) 75%.        Intake/Output Summary (Last 24 hours) at 12/10/2022 0501 Last data filed at 12/10/2022 0453 Gross per 24 hour  Intake 2810.32 ml  Output --  Net 2810.32 ml   Filed Weights   12/09/22 2152  Weight: 81.5 kg    Examination: General: Deconditioned looking male.  Lying in bed without any distress no oxygen HENT: No oxygen looks dehydrated Lungs: Clear to auscultation bilaterally Cardiovascular: Normal heart sounds hypotensive on Levophed peripherally Abdomen: Soft nontender no organomegaly Extremities: Intact Neuro: RASS sedation score -4 post lorazepam GU: Reported  sacral decub according to caretaker  Resolved Hospital Problem list   x  Assessment & Plan:   Right lower lobe PE with right common femoral DVT at admission: Incidental finding not requiring oxygen and no RV strain on CT Associated right lower lobe atelectasis   P:   IV heparin    Acute encephalopathy for few to several days prior to admission: Likely metabolic +/- Cipro, CT head negative  - 12/10/2022 worsened in the ER after lorazepam  P:   EEG Avoid benzodiazepines Treat underlying metabolic issues Check ammonia level Thiamine Folic acid       History of hypertension on Toprol, ramipril and amlodipine Circulatory shock at admission [unlikely  related to PE] -likely due to dehydration +/- sepsis +/- acidosis  - 12/10/2022: Levophed 14 mcg through peripheral IV  P:  Fluid bolus Hydration with dextrose containing fluid Bicarbonate infusion Hydrocortisone empiric  MAP goal greater than 65  Place CVL if needed  Atrial fibrillation upon admission [2023 EKG normal sinus rhythm]  Plan  - Amiodarone if needed    K[place]own CAD -on baby aspirin, Imdur and Ranexa [followed by Dr. Elijah Birk Kelly]  P: Check ECHO Cycle trop       Covid in Jan 2023 Concern for spepsis on basis of dark urine And shock but No fever and normal WBC   P:   Check PCT AWait cultures Empiric antibiotics - low th reshold to dc Cefepie 9/12 > 9/12 Flagyl 9/12 >9/12 Vanc 12/10/22      AKI creatinine 1.43 mg percent at admission [baseline 1.1 mg percent    P:  Hydrate Avoid nephrotoxic drugs Maintain hemodynamics Hold ACE inhibitor    Metabolic acidosis [anion gap 21, bicarbonate 10, lactic acidosis 3.3 mild, mild elevation in glucose) with associated ketonuria at admission  -Features consistent with starvation versus alcoholic ketoacidosis [although caretaker did tell ED.  Patient had normal intake]   P Bicarb with dextrose containing fluid Fluid boluses Thiamine Folic  acid Need to elicit history of alcohol intake versus starvation    Severe hypomagnesemia at admission Moderate to severe hypocalcemia at admission Mild hypokalemia at admission  P: Replete Monitor    Acid reflux on PPI    P:   PPI Stop fish oil    New onset anemia at admission 10.7 g%; no active bleeding    P - PRBC for hgb </= 6.9gm%    - exceptions are   -  if ACS susepcted/confirmed then transfuse for hgb </= 8.0gm%,  or    -  active bleeding with hemodynamic instability, then transfuse regardless of hemoglobin value   At at all times try to transfuse 1 unit prbc as possible with exception of active hemorrhage    History of hypothyroidism on Synthroid  P:   Check TSH before restarting Synthroid  MSK/DERM Bed bound due to equiibirium issues  Some sacral skin tears at admit    Best practice (daily eval):  Diet: npo Pain/Anxiety/Delirium protocol (if indicated): x VAP protocol (if indicated): x DVT prophylaxis: IV heparin for PE GI prophylaxis: PPI Glucose control: ssi Mobility: bed rest Code Status: full code Family Communication: APP JD Suzie Portela spoke to brother Disposition: to ICU      ATTESTATION & SIGNATURE   The patient Bradley Oconnor is critically ill with multiple organ systems failure and requires high complexity decision making for assessment and support, frequent evaluation and titration of therapies, application of advanced monitoring technologies and extensive interpretation of multiple databases.   Critical Care Time devoted to patient care services described in this note is 60  Minutes. This time reflects time of care of this signee Dr Kalman Shan. This critical care time does not reflect procedure time, or teaching time or supervisory time of PA/NP/Med student/Med Resident etc but could involve care discussion time      SIGNATURE    Dr. Kalman Shan, M.D., F.C.C.P,  Pulmonary and Critical Care Medicine Staff  Physician, [facility] Director - Interstitial Lung Disease  Program  Pulmonary Fibrosis [facility] at [place], [place], [address]  NPI Number:  NPI #[id number]  Pager: [phone], If no answer  -> Check AMION or Try [phone] Telephone (clinical office): [phone] Telephone (research): [phone]  5:01 AM 12/10/2022   12/10/2022 5:01 AM    LABS    PULMONARY Recent Labs  Lab 12/10/22 0159  TCO2 14*    CBC Recent Labs  Lab 12/09/22 2331 12/10/22 0159  HGB 10.7* 9.2*  HCT 34.7* 27.0*  WBC 8.9  --   PLT 327  --     COAGULATION Recent Labs  Lab 12/10/22 0300  INR 1.6*    CARDIAC  No results for input(s): "TROPONINI" in the last 168 hours. No results for input(s): "PROBNP" in the last 168 hours.  CHEMISTRY Recent Labs  Lab 12/09/22 2331 12/10/22 0159 12/10/22 0300  NA 139 142 140  K 3.9 3.8 3.0*  CL 108 108 113*  CO2 10*  --  10*  GLUCOSE 58* 173* 82  BUN 21 23 19   CREATININE 1.43* 1.00 1.29*  CALCIUM 6.0*  --  5.5*  MG <0.5*  --  1.1*  PHOS  --   --  3.0   Estimated Creatinine Clearance: 42 mL/min (A) (by C-G formula based on SCr of 1.29 mg/dL (H)).   LIVER Recent Labs  Lab 12/09/22 2331 12/10/22 0300  AST 28  --   ALT 19  --   ALKPHOS 64  --   BILITOT 1.3*  --   PROT 5.6*  --   ALBUMIN 2.0*  --   INR  --  1.6*     INFECTIOUS Recent Labs  Lab 12/09/22 2339 12/10/22 0200 12/10/22 0307  LATICACIDVEN 3.3* 2.8* 2.6*     ENDOCRINE CBG (last 3)  Recent Labs    12/10/22 0135 12/10/22 0218 12/10/22 0402  GLUCAP 46* 118* 74  IMAGING x48h  - image(s) personally visualized  -   highlighted in bold CT Angio Chest PE W and/or Wo Contrast  Addendum Date: 12/10/2022   ADDENDUM REPORT: 12/10/2022 04:21 ADDENDUM: Critical Value/emergent results were called by telephone at the time of interpretation on 12/10/2022 at 4:21 am to provider Berle Mull , who  verbally acknowledged these results. Electronically Signed   By: Thornell Sartorius M.D.   On: 12/10/2022 04:21   Result Date: 12/10/2022 CLINICAL DATA:  Pulmonary embolism suspected, high probability. Abdominal pain, acute, nonlocalized. EXAM: CT ANGIOGRAPHY CHEST CT ABDOMEN AND PELVIS WITH CONTRAST TECHNIQUE: Multidetector CT imaging of the chest was performed using the standard protocol during bolus administration of intravenous contrast. Multiplanar CT image reconstructions and MIPs were obtained to evaluate the vascular anatomy. Multidetector CT imaging of the abdomen and pelvis was performed using the standard protocol during bolus administration of intravenous contrast. RADIATION DOSE REDUCTION: This exam was performed according to the departmental dose-optimization program which includes automated exposure control, adjustment of the mA and/or kV according to patient size and/or use of iterative reconstruction technique. CONTRAST:  75mL OMNIPAQUE IOHEXOL 350 MG/ML SOLN COMPARISON:  10/31/2022, 08/31/2009. FINDINGS: CTA CHEST FINDINGS Cardiovascular: The heart is enlarged and there is a small pericardial effusion. Multi-vessel coronary artery calcifications are noted. There is atherosclerotic calcification of the aorta without evidence of aneurysm. The pulmonary trunk is normal in caliber. Multiple pulmonary artery filling defects are identified in the lobar, segmental, and subsegmental arteries on the right. There is no evidence of right heart strain. Mediastinum/Nodes: No mediastinal, hilar, or axillary lymphadenopathy. The thyroid gland, trachea, and esophagus are within normal limits. Lungs/Pleura: Strandy atelectasis or infiltrate is present at the lung bases. There are small bilateral pleural effusions, greater on the right than on the left. No pneumothorax. Musculoskeletal: Sternotomy wires are noted. Cervical spinal fusion hardware is present. Degenerative changes are present in the thoracic spine. No  acute osseous abnormality. Review of the MIP images confirms the above findings. CT ABDOMEN and PELVIS FINDINGS Hepatobiliary: No focal abnormality in the liver. No biliary ductal dilatation. The gallbladder is distended measuring 6.4 cm in diameter with multiple layering stones. Pancreas: Unremarkable. No pancreatic ductal dilatation or surrounding inflammatory changes. Spleen: Normal in size without focal abnormality. Adrenals/Urinary Tract: The adrenal glands are within normal limits. The kidneys enhance symmetrically. Cysts are present in the right kidney. No renal calculus or hydronephrosis. Foley catheter terminates in the urinary bladder. There is mild bladder wall thickening. Stomach/Bowel: Gastric wall thickening is noted. No bowel obstruction, free air, or pneumatosis. Scattered diverticula are present along the colon without evidence of diverticulitis. No focal bowel wall thickening. The appendix is not seen. Vascular/Lymphatic: There is atherosclerotic calcification of the aorta with focal aneurysmal dilatation of the proximal abdominal aorta measuring 3.8 x 2.7 cm. There is aneurysmal dilatation of the common iliac artery on the right measuring 2.2 cm. No abdominal or pelvic lymphadenopathy. A filling defect is present in the common femoral vein on the right. Reproductive: Prostate is unremarkable. Other: No abdominopelvic ascites. Musculoskeletal: Degenerative changes are present in the lumbar spine. No acute osseous abnormality. Review of the MIP images confirms the above findings. IMPRESSION: 1. Multiple pulmonary emboli on the right. No evidence of right heart strain. 2. Filling defect in the common femoral vein on the right, compatible with DVT. 3. Strandy atelectasis or infiltrate at the lung bases. 4. Small bilateral pleural effusions. 5. Cardiomegaly with multi-vessel coronary artery calcifications. 6. Gallbladder hydrops with multiple layering  stones in the gallbladder. 7. Mild gallbladder wall  thickening, possible infectious or inflammatory cystitis. 8. Diverticulosis without diverticulitis. 9. 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 Recommend follow-up ultrasound every 2 years. This recommendation follows ACR consensus guidelines: White Paper of the ACR Incidental Findings Committee II on Vascular Findings. J Am Coll Radiol 2013; 10:789-794. Electronically Signed: By: Thornell Sartorius M.D. On: 12/10/2022 04:17   CT ABDOMEN PELVIS W CONTRAST  Addendum Date: 12/10/2022   ADDENDUM REPORT: 12/10/2022 04:21 ADDENDUM: Critical Value/emergent results were called by telephone at the time of interpretation on 12/10/2022 at 4:21 am to provider Berle Mull , who verbally acknowledged these results. Electronically Signed   By: Thornell Sartorius M.D.   On: 12/10/2022 04:21   Result Date: 12/10/2022 CLINICAL DATA:  Pulmonary embolism suspected, high probability. Abdominal pain, acute, nonlocalized. EXAM: CT ANGIOGRAPHY CHEST CT ABDOMEN AND PELVIS WITH CONTRAST TECHNIQUE: Multidetector CT imaging of the chest was performed using the standard protocol during bolus administration of intravenous contrast. Multiplanar CT image reconstructions and MIPs were obtained to evaluate the vascular anatomy. Multidetector CT imaging of the abdomen and pelvis was performed using the standard protocol during bolus administration of intravenous contrast. RADIATION DOSE REDUCTION: This exam was performed according to the departmental dose-optimization program which includes automated exposure control, adjustment of the mA and/or kV according to patient size and/or use of iterative reconstruction technique. CONTRAST:  75mL OMNIPAQUE IOHEXOL 350 MG/ML SOLN COMPARISON:  10/31/2022, 08/31/2009. FINDINGS: CTA CHEST FINDINGS Cardiovascular: The heart is enlarged and there is a small pericardial effusion. Multi-vessel coronary artery calcifications are noted. There is  atherosclerotic calcification of the aorta without evidence of aneurysm. The pulmonary trunk is normal in caliber. Multiple pulmonary artery filling defects are identified in the lobar, segmental, and subsegmental arteries on the right. There is no evidence of right heart strain. Mediastinum/Nodes: No mediastinal, hilar, or axillary lymphadenopathy. The thyroid gland, trachea, and esophagus are within normal limits. Lungs/Pleura: Strandy atelectasis or infiltrate is present at the lung bases. There are small bilateral pleural effusions, greater on the right than on the left. No pneumothorax. Musculoskeletal: Sternotomy wires are noted. Cervical spinal fusion hardware is present. Degenerative changes are present in the thoracic spine. No acute osseous abnormality. Review of the MIP images confirms the above findings. CT ABDOMEN and PELVIS FINDINGS Hepatobiliary: No focal abnormality in the liver. No biliary ductal dilatation. The gallbladder is distended measuring 6.4 cm in diameter with multiple layering stones. Pancreas: Unremarkable. No pancreatic ductal dilatation or surrounding inflammatory changes. Spleen: Normal in size without focal abnormality. Adrenals/Urinary Tract: The adrenal glands are within normal limits. The kidneys enhance symmetrically. Cysts are present in the right kidney. No renal calculus or hydronephrosis. Foley catheter terminates in the urinary bladder. There is mild bladder wall thickening. Stomach/Bowel: Gastric wall thickening is noted. No bowel obstruction, free air, or pneumatosis. Scattered diverticula are present along the colon without evidence of diverticulitis. No focal bowel wall thickening. The appendix is not seen. Vascular/Lymphatic: There is atherosclerotic calcification of the aorta with focal aneurysmal dilatation of the proximal abdominal aorta measuring 3.8 x 2.7 cm. There is aneurysmal dilatation of the common iliac artery on the right measuring 2.2 cm. No abdominal or  pelvic lymphadenopathy. A filling defect is present in the common femoral vein on the right. Reproductive: Prostate is unremarkable. Other: No abdominopelvic ascites. Musculoskeletal: Degenerative changes are present in the lumbar spine. No acute osseous abnormality. Review of the MIP images confirms  the above findings. IMPRESSION: 1. Multiple pulmonary emboli on the right. No evidence of right heart strain. 2. Filling defect in the common femoral vein on the right, compatible with DVT. 3. Strandy atelectasis or infiltrate at the lung bases. 4. Small bilateral pleural effusions. 5. Cardiomegaly with multi-vessel coronary artery calcifications. 6. Gallbladder hydrops with multiple layering stones in the gallbladder. 7. Mild gallbladder wall thickening, possible infectious or inflammatory cystitis. 8. Diverticulosis without diverticulitis. 9. 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 Recommend follow-up ultrasound every 2 years. This recommendation follows ACR consensus guidelines: White Paper of the ACR Incidental Findings Committee II on Vascular Findings. J Am Coll Radiol 2013; 10:789-794. Electronically Signed: By: Thornell Sartorius M.D. On: 12/10/2022 04:17   CT HEAD WO CONTRAST ( )  Result Date: 12/09/2022 CLINICAL DATA:  Delirium. EXAM: CT HEAD WITHOUT CONTRAST TECHNIQUE: Contiguous axial images were obtained from the base of the skull through the vertex without intravenous contrast. RADIATION DOSE REDUCTION: This exam was performed according to the departmental dose-optimization program which includes automated exposure control, adjustment of the mA and/or kV according to patient size and/or use of iterative reconstruction technique. COMPARISON:  Head CT 07/22/2019 FINDINGS: Brain: No evidence of acute infarction, hemorrhage, hydrocephalus, extra-axial collection or mass lesion/mass effect. There is stable mild periventricular white matter hypodensity  and stable mild diffuse atrophy. 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 Vascular: No hyperdense vessel or unexpected calcification. Skull: Normal. Negative for fracture or focal lesion. Sinuses/Orbits: No acute finding. Other: None. IMPRESSION: 1. No acute intracranial abnormality identified. 2. Stable mild periventricular white matter hypodensity and stable mild diffuse atrophy. 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 Electronically Signed   By: Darliss Cheney M.D.   On: 12/09/2022 23:32   DG Chest 2 View  Result Date: 12/09/2022 CLINICAL DATA:  Dyspnea, altered mental status EXAM: CHEST - 2 VIEW COMPARISON:  12/31/2016 FINDINGS: Lateral examination slightly limited with exclusion of the a posterior lungs and costophrenic angles. Lungs are clear. No pneumothorax or pleural effusion. Coronary artery bypass grafting has been performed. Cardiac size within normal limits. Pulmonary vascularity is normal. No acute bone abnormality. IMPRESSION: 1. No active cardiopulmonary disease. 2. Status post coronary artery bypass grafting. Electronically Signed   By: Helyn Numbers M.D.   On: 12/09/2022 23:07

## 2022-12-10 NOTE — Progress Notes (Signed)
ANTICOAGULATION CONSULT NOTE  Pharmacy Consult for IV heparin Indication: pulmonary embolus and DVT  Allergies  Allergen Reactions   Procardia [Nifedipine] Other (See Comments)    Lowers bp    Phenergan [Promethazine Hcl] Nausea And Vomiting    Patient Measurements: Height: 5\' 8"  (172.7 cm) Weight: 69.6 kg (153 lb 7 oz) IBW/kg (Calculated) : 68.4 Heparin Dosing Weight: 70 kg  Vital Signs: Temp: 97.4 F (36.3 C) (09/12 0800) Temp Source: Axillary (09/12 0800) BP: 100/66 (09/12 0845) Pulse Rate: 97 (09/12 1200)  Labs: Recent Labs    12/09/22 2331 12/10/22 0159 12/10/22 0300 12/10/22 0829 12/10/22 1300  HGB 10.7* 9.2*  --  8.8*  --   HCT 34.7* 27.0*  --  27.6*  --   PLT 327  --   --  274  --   APTT  --   --  28  --   --   LABPROT  --   --  19.3*  --   --   INR  --   --  1.6*  --   --   HEPARINUNFRC  --   --   --   --  >1.10*  CREATININE 1.43* 1.00 1.29* 1.16  --   TROPONINIHS 67*  --  62*  --   --     Estimated Creatinine Clearance: 46.7 mL/min (by C-G formula based on SCr of 1.16 mg/dL).   Assessment: Bradley Oconnor is a 82 y.o. year old male admitted on 12/09/2022 with R PE and DVT.  Pharmacy consulted to dose heparin.  Initial heparin level supra-therapeutic at > 1.1 units/mL on 1300 units/hr.  Lab obtained appropriately per discussion with RN.  He was also bleeding from the central line when it was placed but has stopped.  Noted patient's weight was 81.5 kg and now at 69.6 kg.  Goal of Therapy:  Heparin level 0.3-0.7 units/ml Monitor platelets by anticoagulation protocol: Yes   Plan:  Hold heparin x 1 hr, then Resume heparin gtt at 1050 units/hr Check 8 hr heparin level Daily heparin level and CBC  Athalia Setterlund D. Laney Potash, PharmD, BCPS, BCCCP 12/10/2022, 2:47 PM

## 2022-12-10 NOTE — Progress Notes (Signed)
ANTICOAGULATION CONSULT NOTE   Pharmacy Consult for IV heparin Indication: pulmonary embolus  Allergies  Allergen Reactions   Procardia [Nifedipine] Other (See Comments)    Lowers bp    Phenergan [Promethazine Hcl] Nausea And Vomiting    Patient Measurements: Height: 5\' 8"  (172.7 cm) Weight: 81.5 kg (179 lb 10.8 oz) IBW/kg (Calculated) : 68.4 Heparin Dosing Weight: 81.5 kg  Vital Signs: Temp: 99.8 F (37.7 C) (09/11 2350) Temp Source: Rectal (09/11 2350) BP: 84/60 (09/12 0400) Pulse Rate: 110 (09/12 0400)  Labs: Recent Labs    12/09/22 2331 12/10/22 0159 12/10/22 0300  HGB 10.7* 9.2*  --   HCT 34.7* 27.0*  --   PLT 327  --   --   APTT  --   --  28  LABPROT  --   --  19.3*  INR  --   --  1.6*  CREATININE 1.43* 1.00 1.29*  TROPONINIHS 67*  --  62*    Estimated Creatinine Clearance: 42 mL/min (A) (by C-G formula based on SCr of 1.29 mg/dL (H)).   Medical History: Past Medical History:  Diagnosis Date   Blood transfusion without reported diagnosis    CAD (coronary artery disease)    GERD (gastroesophageal reflux disease)    Heart attack (HCC)    Hyperlipidemia    Hypertension 03/07/12   ECHO-WNL     08/12/11 Lexiscan MyoviewNo significant ischemia demonstrated Low risk scan There is a moderate sized dense scar in the LCX territoy unchanged from the prior study.. Post- stress EF is 40%.   Thyroid disease     Assessment: Bradley Oconnor is a 82 y.o. year old male admitted on 12/09/2022 with concern for PE with no R heart strain. No anticoagulation prior to admission. Pharmacy consulted to dose heparin.  Goal of Therapy:  Heparin level 0.3-0.7 units/ml Monitor platelets by anticoagulation protocol: Yes   Plan:  Heparin 4000 units x 1 as bolus followed by heparin infusion at 1300 units/hr 8 heparin level  Daily heparin level, CBC, and monitoring for bleeding F/u plans for anticoagulation   Thank you for allowing pharmacy to participate in this patient's  care.  Marja Kays, PharmD Emergency Medicine Clinical Pharmacist 12/10/2022,4:32 AM

## 2022-12-10 NOTE — Progress Notes (Signed)
VASCULAR LAB    Bilateral lower extremity venous duplex has been performed.  See CV proc for preliminary results.   Mycheal Veldhuizen, RVT 12/10/2022, 1:29 PM

## 2022-12-10 NOTE — Progress Notes (Signed)
Removed pt necklace and gave to pt. Brother  (Randal) at bedside. Brother took necklace with him

## 2022-12-11 DIAGNOSIS — A419 Sepsis, unspecified organism: Secondary | ICD-10-CM | POA: Diagnosis not present

## 2022-12-11 DIAGNOSIS — R6521 Severe sepsis with septic shock: Secondary | ICD-10-CM | POA: Diagnosis not present

## 2022-12-11 LAB — GLUCOSE, CAPILLARY
Glucose-Capillary: 110 mg/dL — ABNORMAL HIGH (ref 70–99)
Glucose-Capillary: 134 mg/dL — ABNORMAL HIGH (ref 70–99)
Glucose-Capillary: 153 mg/dL — ABNORMAL HIGH (ref 70–99)
Glucose-Capillary: 154 mg/dL — ABNORMAL HIGH (ref 70–99)
Glucose-Capillary: 104 mg/dL — ABNORMAL HIGH (ref 70–99)
Glucose-Capillary: 120 mg/dL — ABNORMAL HIGH (ref 70–99)

## 2022-12-11 LAB — POCT I-STAT 7, (LYTES, BLD GAS, ICA,H+H)
Acid-Base Excess: 2 mmol/L (ref 0.0–2.0)
Bicarbonate: 23.8 mmol/L (ref 20.0–28.0)
Calcium, Ion: 0.87 mmol/L — CL (ref 1.15–1.40)
HCT: 25 % — ABNORMAL LOW (ref 39.0–52.0)
Hemoglobin: 8.5 g/dL — ABNORMAL LOW (ref 13.0–17.0)
O2 Saturation: 98 %
Patient temperature: 97.7
Potassium: 3.7 mmol/L (ref 3.5–5.1)
Sodium: 138 mmol/L (ref 135–145)
TCO2: 25 mmol/L (ref 22–32)
pCO2 arterial: 26.7 mmHg — ABNORMAL LOW (ref 32–48)
pH, Arterial: 7.556 — ABNORMAL HIGH (ref 7.35–7.45)
pO2, Arterial: 82 mmHg — ABNORMAL LOW (ref 83–108)

## 2022-12-11 LAB — BASIC METABOLIC PANEL
Anion gap: 11 (ref 5–15)
BUN: 12 mg/dL (ref 8–23)
CO2: 21 mmol/L — ABNORMAL LOW (ref 22–32)
Calcium: 6.1 mg/dL — CL (ref 8.9–10.3)
Chloride: 105 mmol/L (ref 98–111)
Creatinine, Ser: 1.15 mg/dL (ref 0.61–1.24)
GFR, Estimated: >60 mL/min (ref >60)
Glucose, Bld: 168 mg/dL — ABNORMAL HIGH (ref 70–99)
Potassium: 2.9 mmol/L — ABNORMAL LOW (ref 3.5–5.1)
Sodium: 137 mmol/L (ref 135–145)

## 2022-12-11 LAB — CBC
HCT: 23.1 % — ABNORMAL LOW (ref 39.0–52.0)
Hemoglobin: 7.7 g/dL — ABNORMAL LOW (ref 13.0–17.0)
MCH: 31.7 pg (ref 26.0–34.0)
MCHC: 33.3 g/dL (ref 30.0–36.0)
MCV: 95.1 fL (ref 80.0–100.0)
Platelets: 223 10*3/uL (ref 150–400)
RBC: 2.43 MIL/uL — ABNORMAL LOW (ref 4.22–5.81)
RDW: 16.5 % — ABNORMAL HIGH (ref 11.5–15.5)
WBC: 6.7 10*3/uL (ref 4.0–10.5)
nRBC: 0 % (ref 0.0–0.2)

## 2022-12-11 LAB — HEPARIN LEVEL (UNFRACTIONATED)
Heparin Unfractionated: 0.82 [IU]/mL — ABNORMAL HIGH (ref 0.30–0.70)
Heparin Unfractionated: 0.85 [IU]/mL — ABNORMAL HIGH (ref 0.30–0.70)
Heparin Unfractionated: >1.1 [IU]/mL — ABNORMAL HIGH (ref 0.30–0.70)

## 2022-12-11 LAB — TROPONIN I (HIGH SENSITIVITY): Troponin I (High Sensitivity): 117 ng/L (ref <18)

## 2022-12-11 LAB — AMMONIA: Ammonia: <10 umol/L (ref 9–35)

## 2022-12-11 LAB — PROCALCITONIN: Procalcitonin: <0.1 ng/mL

## 2022-12-11 MED ORDER — SODIUM CHLORIDE 0.9 % IV SOLN
4.0000 g | Freq: Once | INTRAVENOUS | Status: AC
  Administered 2022-12-11: 4 g via INTRAVENOUS
  Filled 2022-12-11: qty 40

## 2022-12-11 MED ORDER — POTASSIUM CHLORIDE 10 MEQ/50ML IV SOLN
10.0000 meq | INTRAVENOUS | Status: AC
  Administered 2022-12-11 (×8): 10 meq via INTRAVENOUS
  Filled 2022-12-11 (×6): qty 50

## 2022-12-11 MED ORDER — OXIDIZED CELLULOSE EX PADS: 1.0000 | MEDICATED_PAD | Freq: Once | CUTANEOUS | Status: DC

## 2022-12-11 MED ORDER — ACETAMINOPHEN 325 MG PO TABS
650.0000 mg | ORAL_TABLET | Freq: Four times a day (QID) | ORAL | Status: DC | PRN
  Administered 2022-12-11 – 2022-12-20 (×4): 650 mg via ORAL
  Filled 2022-12-11 (×4): qty 2

## 2022-12-11 MED ORDER — OXIDIZED CELLULOSE EX PADS
1.0000 | MEDICATED_PAD | Freq: Once | CUTANEOUS | Status: AC
  Administered 2022-12-11: 1 via TOPICAL
  Filled 2022-12-11: qty 1

## 2022-12-11 MED ORDER — HEPARIN (PORCINE) 25000 UT/250ML-% IV SOLN
800.0000 [IU]/h | INTRAVENOUS | Status: DC
  Administered 2022-12-11: 800 [IU]/h via INTRAVENOUS
  Filled 2022-12-11: qty 250

## 2022-12-11 MED ORDER — "THROMBI-PAD 3""X3"" EX PADS"
1.0000 | MEDICATED_PAD | CUTANEOUS | Status: DC
  Filled 2022-12-11: qty 1

## 2022-12-11 MED ORDER — INSULIN ASPART 100 UNIT/ML IJ SOLN
7.0000 [IU] | Freq: Once | INTRAMUSCULAR | Status: AC
  Administered 2022-12-11: 7 [IU] via SUBCUTANEOUS

## 2022-12-11 MED ORDER — INSULIN ASPART 100 UNIT/ML IJ SOLN
2.0000 [IU] | INTRAMUSCULAR | Status: DC
  Administered 2022-12-11: 2 [IU] via SUBCUTANEOUS
  Administered 2022-12-11 (×2): 4 [IU] via SUBCUTANEOUS
  Administered 2022-12-12 (×3): 2 [IU] via SUBCUTANEOUS
  Administered 2022-12-13: 4 [IU] via SUBCUTANEOUS
  Administered 2022-12-13 (×2): 2 [IU] via SUBCUTANEOUS

## 2022-12-11 MED ORDER — ENSURE ENLIVE PO LIQD
237.0000 mL | Freq: Three times a day (TID) | ORAL | Status: DC
  Administered 2022-12-11 – 2022-12-21 (×25): 237 mL via ORAL

## 2022-12-11 MED ORDER — HEPARIN (PORCINE) 25000 UT/250ML-% IV SOLN
650.0000 [IU]/h | INTRAVENOUS | Status: DC
  Administered 2022-12-12: 400 [IU]/h via INTRAVENOUS
  Filled 2022-12-11: qty 250

## 2022-12-11 NOTE — Progress Notes (Signed)
[facility] ADULT ICU REPLACEMENT PROTOCOL   The patient does apply for the [facility] Adult ICU Electrolyte Replacment Protocol based on the criteria listed below:   1.Exclusion criteria: TCTS, ECMO, Dialysis, and Myasthenia Gravis patients 2. Is GFR >/= 30 ml/min? Yes.    Patient's GFR today is >60 3. Is SCr </= 2? Yes.   Patient's SCr is 1.15 mg/dL 4. Did SCr increase >/= 0.5 in 24 hours? No. 5.Pt's weight >40kg  Yes.   6. Abnormal electrolyte(s): k 2.9  7. Electrolytes replaced per protocol 8.  Call MD STAT for K+ </= 2.5, Phos </= 1, or Mag </= 1 Physician:    Markus Daft A 12/11/2022 3:29 AM

## 2022-12-11 NOTE — Progress Notes (Signed)
Encephalopathy, hypotension, PE     12/11/22 1401  TOC Brief Assessment  Insurance and Status Reviewed  Patient has primary care physician Yes  Home environment has been reviewed bedbound, 24/7 care @ home provided by [facility] [employer]  Prior level of function: bedbound  Prior/Current Home Services No current home services  Social Determinants of Health Reivew SDOH reviewed no interventions necessary  Readmission risk has been reviewed No  Transition of care needs transition of care needs identified, TOC will continue to follow   From home alone with 24/7 caregiver provided by [employer]. Bedbound. Supportive brother, Randal [phone].   TOC team following and will assist with need... Gae Gallop RN,BSN,CM [phone]

## 2022-12-11 NOTE — Progress Notes (Signed)
Brief Nutrition Note  RN requested nutrition supplements to augment intake. RD currently not following, but added Ensure Enlive po TID, each supplement provides 350 kcal and 20 grams of protein.  Please submit a consult if additional nutrition interventions are needed  Greig Castilla, RD, LDN Clinical Dietitian RD pager # available in [facility]  After hours/weekend pager # available in [facility]

## 2022-12-11 NOTE — Progress Notes (Signed)
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  Neurological but a decline neurology visit in summer 2023].  Other medical problems include history of anemia, coronary artery disease, acid reflux, hyperlipidemia.  At some point in the last he has become completely bedbound although neurologically intact and oriented.  He is able to self feed.  Her most recent ER visit is August 2024 for dark urine and possible UTI.  He has a caretaker in his house and has episodes of incontinence but was able to converse and is mostly bedbound.   Then presented 12/09/2022 with history of confusion for the last 3-5 days and increased confusion on 12/09/2022 with a history of starting ciprofloxacin 2 days prior to presentation for possible UTI.  In the ED blood pressures were soft but despite 3 L of fluid required vasopressors Levophed through peripheral line.  Hypotension got worse after giving Ativan for some restlessness prior to CT scan.   Had significant lab abnormalities and this includes mild hypokalemia at presentation associated with severe low bicarbonate of 10 suggestive of metabolic acidosis, severe hypocalcemia [6 mg albumin uncorrected], elevated anion gap of 21 [associated albumin 2.0] , severe hypomagnesemia less than 0.5 mg percent, lactic acidosis 3.3 mg percent upon presentation but also ongoing mild anemia but normal white count   Urinalysis with positive urine ketones but no white cells   CT head normal   Last echocardiogram 2018 with grade 1 diastolic dysfunction.  Whole-body CT scan with  right lower lobe distal PE without RV strain and right lower extremity DVT.   Social: wife died recently  Pertinent  Medical History   Past Medical History:  Diagnosis Date   Blood transfusion without reported diagnosis    CAD (coronary artery disease)    GERD (gastroesophageal reflux disease)    Heart attack (HCC)    Hyperlipidemia    Hypertension 03/07/12   ECHO-WNL     08/12/11 Lexiscan MyoviewNo significant ischemia demonstrated Low risk scan There is a moderate sized dense scar in the LCX territoy unchanged from the prior study.. Post- stress EF is 40%.   Thyroid disease    Significant Hospital Events: Including procedures, antibiotic start and stop dates in addition to other pertinent events   9/12 admitted, CVL and A-line placed, EEG performed   Interim History / Subjective:  Patient is feeling better He is feeling tired and weak, he has lost significant amount of weight  No chest or abdominal pain  Objective   Blood pressure 100/66, pulse (!) 103, temperature 98 F (36.7 C), temperature source Oral, resp. rate 15, height 5\' 8"  (1.727 m), weight 69.6 kg, SpO2 96%.        Intake/Output Summary (Last 24 hours) at 12/11/2022 0721 Last data filed at 12/11/2022 0700 Gross per 24 hour  Intake 7081.18 ml  Output 1600 ml  Net 5481.18 ml   Filed Weights   12/09/22 2152 12/10/22 0701  Weight: 81.5 kg 69.6 kg    Examination: General: elderly male, tired and ill appearing HENT: [place]/AT, moist mucous membranes, sclera anicteric Lungs: clear to auscultation,  no wheezing Cardiovascular: irregularly irregular, no murmurs Abdomen: soft, non-tender, BS+ Extremities: warm, no edema, ecchymoses RUE Neuro: alert and oriented, moving all extremities GU: foley in place  Resolved Hospital Problem list     Assessment & Plan:  Septic Shock Bilateral Lower Lobe Pneumonia, POA Possible UTI, POA - continue vancomycin and zosyn - follow up blood, urine and sputum cultures  Acute  Metabolic Encephalopathy - improving - CT head without acute abnormality - EEG consistent with encephalopathy  Acute Pulmonary Emboli Acute Right Lower Extremity DVT, common femoral vein - continue heparin - plan to transition to eliquis in future  Acute Kidney Injury Severe High Anion Gap Metabolic Acidosis Hypokalemia - continue bicarb drip for now - check ABG, if pH greater than 7.3 will stop bicarb - monitor UOP and Cr  Paroxysmal Atrial Fibrillation Coronary Artery Disease - continue heparin drip  Anemia of Critical Illness - monitor, transfuse for hemoglobin < 7g/dL  Best Practice (right click and "Reselect all SmartList Selections" daily)   Diet/type: Regular consistency (see orders) DVT prophylaxis: systemic heparin GI prophylaxis: PPI Lines: Central line, Arterial Line, and yes and it is still needed Foley:  Yes, and it is still needed Code Status:  full code Last date of multidisciplinary goals of care discussion [9/12]  Labs   CBC: Recent Labs  Lab 12/09/22 2331 12/10/22 0159 12/10/22 0829 12/11/22 0020  WBC 8.9  --  9.5 6.7  NEUTROABS 6.8  --   --   --   HGB 10.7* 9.2* 8.8* 7.7*  HCT 34.7* 27.0* 27.6* 23.1*  MCV 99.4  --  96.8 95.1  PLT 327  --  274 223    Basic Metabolic Panel: Recent Labs  Lab 12/09/22 2331 12/10/22 0159 12/10/22 0300 12/10/22 0829 12/10/22 1300 12/11/22 0220  NA 139 142 140 141 138 137  K 3.9 3.8 3.0* 3.6 4.0 2.9*  CL 108 108 113* 110 108 105  CO2 10*  --  10* 13* 13* 21*  GLUCOSE 58* 173* 82 159* 232* 168*  BUN 21 23 19 14 13 12   CREATININE 1.43* 1.00 1.29* 1.16 1.17 1.15  CALCIUM 6.0*  --  5.5* 6.0* 6.7* 6.1*  MG <0.5*  --  1.1* 2.6* 2.0  --   PHOS  --   --  3.0 2.7  --   --    GFR: Estimated Creatinine Clearance: 47.1 mL/min (by C-G formula based on SCr of 1.15 mg/dL). Recent Labs  Lab 12/09/22 2331 12/09/22 2339 12/10/22 0307 12/10/22 0518 12/10/22 0519 12/10/22 0829 12/11/22 0020  PROCALCITON  --    --   --   --  <0.10  --  <0.10  WBC 8.9  --   --   --   --  9.5 6.7  LATICACIDVEN  --    < > 2.6* 1.2 1.5 1.8  --    < > = values in this interval not displayed.    Liver Function Tests: Recent Labs  Lab 12/09/22 2331 12/10/22 0829  AST 28 24  ALT 19 16  ALKPHOS 64 61  BILITOT 1.3* 1.3*  PROT 5.6* 4.6*  ALBUMIN 2.0* 1.8*   No results for input(s): "LIPASE", "AMYLASE" in the last 168 hours. Recent Labs  Lab 12/10/22 0519 12/11/22 0020  AMMONIA 10 <10    ABG    Component Value Date/Time   TCO2 14 (L) 12/10/2022 0159     Coagulation Profile: Recent Labs  Lab 12/10/22 0300  INR 1.6*  Cardiac Enzymes: No results for input(s): "CKTOTAL", "CKMB", "CKMBINDEX", "TROPONINI" in the last 168 hours.  HbA1C: Hemoglobin A1C  Date/Time Value Ref Range Status  08/14/2019 12:00 PM 6.1 (A) 4.0 - 5.6 % Final   Hgb A1c MFr Bld  Date/Time Value Ref Range Status  03/03/2021 12:14 PM 6.2 4.6 - 6.5 % Final    Comment:    Glycemic Control Guidelines for People with Diabetes:Non Diabetic:  <6%Goal of Therapy: <7%Additional Action Suggested:  >8%     CBG: Recent Labs  Lab 12/10/22 1110 12/10/22 1505 12/10/22 1930 12/10/22 2324 12/11/22 0328  GLUCAP 160* 228* 254* 253* 110*     Home Medications  Prior to Admission medications   Medication Sig Start Date End Date Taking? Authorizing Provider  amLODipine (NORVASC) 5 MG tablet Take 1 tablet (5 mg total) by mouth daily. 05/29/22   Lennette Bihari, MD  aspirin EC 81 MG tablet Take 1 tablet (81 mg total) by mouth daily. 04/06/16   Lennette Bihari, MD  dextromethorphan-guaiFENesin [facility] DM) 30-600 MG 12hr tablet Take 1 tablet by mouth 2 (two) times daily. 04/11/21   Janeece Agee, NP  esomeprazole (NEXIUM) 20 MG capsule Take 20 mg by mouth daily at 12 noon.    [provider]  fluticasone (FLONASE) 50 MCG/ACT nasal spray Place 2 sprays into both nostrils daily. 04/11/21   Janeece Agee, NP  Garlic 1000 MG CAPS Take 1  capsule by mouth daily.     [provider]  hydrochlorothiazide (MICROZIDE) 12.5 MG capsule TAKE 1 CAPSULE BY MOUTH ONCE DAILY . APPOINTMENT REQUIRED FOR FUTURE REFILLS 09/01/22   Lennette Bihari, MD  isosorbide mononitrate (IMDUR) 120 MG 24 hr tablet Take 1 tablet by mouth once daily 09/01/22   Lennette Bihari, MD  lansoprazole (PREVACID) 15 MG capsule Take 15 mg by mouth daily at 12 noon.    [provider]  levothyroxine (SYNTHROID) 100 MCG tablet TAKE 1 TABLET BY MOUTH ONCE DAILY IN THE MORNING -  NEED  OFFICE  VISIT  WITH  DR.  Tresa Endo  FOR  FUTURE  REFILLS 06/29/22   Lennette Bihari, MD  metoprolol succinate (TOPROL-XL) 100 MG 24 hr tablet TAKE 1 TABLET BY MOUTH ONCE DAILY . APPOINTMENT REQUIRED FOR FUTURE REFILLS 09/01/22   Lennette Bihari, MD  nitroGLYCERIN (NITROSTAT) 0.4 MG SL tablet DISSOLVE ONE TABLET UNDER THE TONGUE EVERY 5 MINUTES AS NEEDED FOR CHEST PAIN.  DO NOT EXCEED A TOTAL OF 3 DOSES IN 15 MINUTES 06/26/21   Lennette Bihari, MD  potassium chloride SA (KLOR-CON M) 20 MEQ tablet Take 1 tablet (20 mEq total) by mouth daily for 5 days. 10/31/22 11/05/22  Gowens, Mariah L, PA-C  ramipril (ALTACE) 10 MG capsule Take 1 capsule (10 mg total) by mouth daily. 11/17/21   Lennette Bihari, MD  ranolazine (RANEXA) 1000 MG SR tablet TAKE 1  BY MOUTH TWICE DAILY 09/01/22   Lennette Bihari, MD  rosuvastatin (CRESTOR) 20 MG tablet TAKE 1 TABLET BY MOUTH ONCE DAILY IN THE EVENING 07/15/22   Lennette Bihari, MD     Critical care time: 40 minutes    Melody Comas, MD [facility] Office: [phone]   See Amion for personal pager PCCM on call pager [phone] until 7pm. Please call Elink 7p-7a. [phone]

## 2022-12-11 NOTE — Progress Notes (Signed)
ANTICOAGULATION CONSULT NOTE  Pharmacy Consult for IV heparin Indication: pulmonary embolus and DVT  Allergies  Allergen Reactions   Procardia [Nifedipine] Other (See Comments)    Lowers bp    Phenergan [Promethazine Hcl] Nausea And Vomiting    Patient Measurements: Height: 5\' 8"  (172.7 cm) Weight: 70.4 kg (155 lb 3.3 oz) IBW/kg (Calculated) : 68.4 Heparin Dosing Weight: 70 kg  Vital Signs: Temp: 97.7 F (36.5 C) (09/13 0758) Temp Source: Oral (09/13 0758) Pulse Rate: 113 (09/13 0930)  Labs: Recent Labs    12/09/22 2331 12/10/22 0159 12/10/22 0300 12/10/22 0829 12/10/22 1300 12/10/22 1300 12/10/22 2218 12/11/22 0020 12/11/22 0220 12/11/22 0900 12/11/22 0917  HGB 10.7*   < >  --  8.8*  --   --   --  7.7*  --   --  8.5*  HCT 34.7*   < >  --  27.6*  --   --   --  23.1*  --   --  25.0*  PLT 327  --   --  274  --   --   --  223  --   --   --   APTT  --   --  28  --   --   --   --   --   --   --   --   LABPROT  --   --  19.3*  --   --   --   --   --   --   --   --   INR  --   --  1.6*  --   --   --   --   --   --   --   --   HEPARINUNFRC  --   --   --   --  >1.10*   < > >1.10* 0.82*  --  >1.10*  --   CREATININE 1.43*   < > 1.29* 1.16 1.17  --   --   --  1.15  --   --   TROPONINIHS 67*  --  62*  --   --   --   --   --  117*  --   --    < > = values in this interval not displayed.    Estimated Creatinine Clearance: 47.1 mL/min (by C-G formula based on SCr of 1.15 mg/dL).   Assessment: Bradley Oconnor is a 82 y.o. year old male admitted on 12/09/2022 with R PE and DVT.  Pharmacy consulted to dose heparin.  Heparin level remains supra-therapeutic at > 1.1 units/mL despite multiple rate reductions.  Lab obtained appropriately per discussion with RN.  He was also bleeding from the central line, but likely triggered from movement. H/H trending down.  Goal of Therapy:  Heparin level 0.3-0.7 units/ml Monitor platelets by anticoagulation protocol: Yes   Plan:  Hold IV  heparin x 90 min (done around 0900), then Resume heparin gtt at 550 units/hr Check 8 hr heparin level Daily heparin level and CBC  Haywood Meinders D. Laney Potash, PharmD, BCPS, BCCCP 12/11/2022, 10:10 AM

## 2022-12-11 NOTE — Progress Notes (Signed)
ANTICOAGULATION CONSULT NOTE  Pharmacy Consult for IV heparin Indication: pulmonary embolus and DVT  Allergies  Allergen Reactions   Procardia [Nifedipine] Other (See Comments)    Lowers bp    Phenergan [Promethazine Hcl] Nausea And Vomiting    Patient Measurements: Height: 5\' 8"  (172.7 cm) Weight: 70.4 kg (155 lb 3.3 oz) IBW/kg (Calculated) : 68.4 Heparin Dosing Weight: 70 kg  Vital Signs: Temp: 97.4 F (36.3 C) (09/13 1906) Temp Source: Oral (09/13 1906) Pulse Rate: 95 (09/13 1900)  Labs: Recent Labs    12/09/22 2331 12/10/22 0159 12/10/22 0300 12/10/22 0829 12/10/22 1300 12/10/22 2218 12/11/22 0020 12/11/22 0220 12/11/22 0900 12/11/22 0917 12/11/22 1830  HGB 10.7*   < >  --  8.8*  --   --  7.7*  --   --  8.5*  --   HCT 34.7*   < >  --  27.6*  --   --  23.1*  --   --  25.0*  --   PLT 327  --   --  274  --   --  223  --   --   --   --   APTT  --   --  28  --   --   --   --   --   --   --   --   LABPROT  --   --  19.3*  --   --   --   --   --   --   --   --   INR  --   --  1.6*  --   --   --   --   --   --   --   --   HEPARINUNFRC  --   --   --   --  >1.10*   < > 0.82*  --  >1.10*  --  0.85*  CREATININE 1.43*   < > 1.29* 1.16 1.17  --   --  1.15  --   --   --   TROPONINIHS 67*  --  62*  --   --   --   --  117*  --   --   --    < > = values in this interval not displayed.    Estimated Creatinine Clearance: 47.1 mL/min (by C-G formula based on SCr of 1.15 mg/dL).   Assessment: Bradley Oconnor is a 82 y.o. year old male admitted on 12/09/2022 with R PE and DVT.  Pharmacy consulted to dose heparin.  Heparin level still supra-therapeutic, but trending down at 0.85  Goal of Therapy:  Heparin level 0.3-0.7 units/ml Monitor platelets by anticoagulation protocol: Yes   Plan:  Decrease heparin to 400 units / hr Check 8 hr heparin level Daily heparin level and CBC  Thank you Okey Regal, PharmD 12/11/2022, 7:22 PM

## 2022-12-12 DIAGNOSIS — R6521 Severe sepsis with septic shock: Secondary | ICD-10-CM | POA: Diagnosis not present

## 2022-12-12 DIAGNOSIS — A419 Sepsis, unspecified organism: Secondary | ICD-10-CM | POA: Diagnosis not present

## 2022-12-12 LAB — GLUCOSE, CAPILLARY
Glucose-Capillary: 116 mg/dL — ABNORMAL HIGH (ref 70–99)
Glucose-Capillary: 122 mg/dL — ABNORMAL HIGH (ref 70–99)
Glucose-Capillary: 124 mg/dL — ABNORMAL HIGH (ref 70–99)
Glucose-Capillary: 146 mg/dL — ABNORMAL HIGH (ref 70–99)
Glucose-Capillary: 88 mg/dL (ref 70–99)
Glucose-Capillary: 98 mg/dL (ref 70–99)

## 2022-12-12 LAB — HEPARIN LEVEL (UNFRACTIONATED)
Heparin Unfractionated: 0.63 [IU]/mL (ref 0.30–0.70)
Heparin Unfractionated: 0.38 [IU]/mL (ref 0.30–0.70)

## 2022-12-12 LAB — CBC
HCT: 25 % — ABNORMAL LOW (ref 39.0–52.0)
Hemoglobin: 8.3 g/dL — ABNORMAL LOW (ref 13.0–17.0)
MCH: 31.6 pg (ref 26.0–34.0)
MCHC: 33.2 g/dL (ref 30.0–36.0)
MCV: 95.1 fL (ref 80.0–100.0)
Platelets: 209 10*3/uL (ref 150–400)
RBC: 2.63 MIL/uL — ABNORMAL LOW (ref 4.22–5.81)
RDW: 16.8 % — ABNORMAL HIGH (ref 11.5–15.5)
WBC: 7.6 10*3/uL (ref 4.0–10.5)
nRBC: 0 % (ref 0.0–0.2)

## 2022-12-12 LAB — BASIC METABOLIC PANEL
Anion gap: 10 (ref 5–15)
BUN: 8 mg/dL (ref 8–23)
CO2: 26 mmol/L (ref 22–32)
Calcium: 7 mg/dL — ABNORMAL LOW (ref 8.9–10.3)
Chloride: 101 mmol/L (ref 98–111)
Creatinine, Ser: 0.94 mg/dL (ref 0.61–1.24)
GFR, Estimated: >60 mL/min (ref >60)
Glucose, Bld: 121 mg/dL — ABNORMAL HIGH (ref 70–99)
Potassium: 3.2 mmol/L — ABNORMAL LOW (ref 3.5–5.1)
Sodium: 137 mmol/L (ref 135–145)

## 2022-12-12 LAB — CALCIUM, IONIZED: Calcium, Ionized, Serum: 3.8 mg/dL — ABNORMAL LOW (ref 4.5–5.6)

## 2022-12-12 LAB — PHOSPHORUS: Phosphorus: 2.3 mg/dL — ABNORMAL LOW (ref 2.5–4.6)

## 2022-12-12 LAB — MAGNESIUM: Magnesium: 1.5 mg/dL — ABNORMAL LOW (ref 1.7–2.4)

## 2022-12-12 MED ORDER — MIDODRINE HCL 5 MG PO TABS
5.0000 mg | ORAL_TABLET | Freq: Three times a day (TID) | ORAL | Status: DC
  Administered 2022-12-12 – 2022-12-14 (×6): 5 mg via ORAL
  Filled 2022-12-12 (×7): qty 1

## 2022-12-12 MED ORDER — POTASSIUM CHLORIDE 10 MEQ/50ML IV SOLN
10.0000 meq | INTRAVENOUS | Status: AC
  Administered 2022-12-12 (×4): 10 meq via INTRAVENOUS
  Filled 2022-12-12 (×4): qty 50

## 2022-12-12 MED ORDER — DEXTROSE-SODIUM CHLORIDE 5-0.9 % IV SOLN: INTRAVENOUS | Status: DC

## 2022-12-12 MED ORDER — VANCOMYCIN HCL 1250 MG/250ML IV SOLN: 1250.0000 mg | INTRAVENOUS | Status: DC

## 2022-12-12 MED ORDER — MAGNESIUM SULFATE 4 GM/100ML IV SOLN
4.0000 g | Freq: Once | INTRAVENOUS | Status: AC
  Administered 2022-12-12: 4 g via INTRAVENOUS
  Filled 2022-12-12: qty 100

## 2022-12-12 MED ORDER — POTASSIUM PHOSPHATES 15 MMOLE/5ML IV SOLN
15.0000 mmol | Freq: Once | INTRAVENOUS | Status: AC
  Administered 2022-12-12: 15 mmol via INTRAVENOUS
  Filled 2022-12-12: qty 5

## 2022-12-12 MED ORDER — ONDANSETRON HCL 4 MG/2ML IJ SOLN
4.0000 mg | Freq: Four times a day (QID) | INTRAMUSCULAR | Status: DC | PRN
  Administered 2022-12-12: 4 mg via INTRAVENOUS
  Filled 2022-12-12: qty 2

## 2022-12-12 MED ORDER — ALBUMIN HUMAN 25 % IV SOLN
50.0000 g | Freq: Four times a day (QID) | INTRAVENOUS | Status: AC
  Administered 2022-12-12 (×3): 50 g via INTRAVENOUS
  Filled 2022-12-12 (×4): qty 200

## 2022-12-12 NOTE — Progress Notes (Signed)
Pharmacist Heart Failure Core Measure Documentation  Assessment: ADONIRAM CICOTTE has an EF documented as 35-40% on 9/12 by ECHO.  Rationale: Heart failure patients with left ventricular systolic dysfunction (LVSD) and an EF < 40% should be prescribed an angiotensin converting enzyme inhibitor (ACEI) or angiotensin receptor blocker (ARB) at discharge unless a contraindication is documented in the medical record.  This patient is not currently on an ACEI or ARB for HF.  This note is being placed in the record in order to provide documentation that a contraindication to the use of these agents is present for this encounter.  ACE Inhibitor or Angiotensin Receptor Blocker is contraindicated (specify all that apply)  []   ACEI allergy AND ARB allergy []   Angioedema []   Moderate or severe aortic stenosis []   Hyperkalemia [x]   Hypotension []   Renal artery stenosis []   Worsening renal function, preexisting renal disease or dysfunction

## 2022-12-12 NOTE — Progress Notes (Signed)
eLink Physician-Brief Progress Note Patient Name: Bradley Oconnor DOB: 1940/12/27 MRN: [id number]   Date of Service  12/12/2022  HPI/Events of Note  Glucose trending low, pt refusing to eat or drink  eICU Interventions  Will start on D5NS at 18ml/hr. Will continue to trend glucose trends.        Tiaria Biby M DELA CRUZ 12/12/2022, 10:31 PM  4:03 AM Notified that Hgb has trended fown from 8.3 to 6.7 Hemodynamically stable, has been weaned from levophed.  With some continued oozing from IV site on right arm. On heparin drip.   Will pause heparin drip temporarily and give 1 unit pRBC.  Will recheck CBC post transfusion.  If Hgb comes up appropriately, VS stable, and no other source of bleeding identified, will restart heparin.

## 2022-12-12 NOTE — Progress Notes (Signed)
Pharmacy Antibiotic Note  Bradley Oconnor is a 82 y.o. male admitted on 12/09/2022 with septic shock. Prior to admission, pt was started on ciprofloxacin for UTI. CT abd/pelv noted to have mild gallbladder wall thickening, possible infectious or inflammatory cystitis. WBC wnl and afebrile. Vancomycin, flagyl and cefepime x 1 in ED. Also requiring pressor support with norepinephrine. Pharmacy has been consulted for zosyn and vancomycin dosing.  Scr has improved to <1. Cultures remained neg so far. D3 abx. We will adjust vanc dose for now.   Plan: Zosyn 3.375g q8h Change vancomycin 1250mg  q24h (eAUC 475, scr 0.94) Vancomycin level as needed  Height: 5\' 8"  (172.7 cm) Weight: 69.6 kg (153 lb 7 oz) IBW/kg (Calculated) : 68.4  Temp (24hrs), Avg:97.9 F (36.6 C), Min:97.4 F (36.3 C), Max:98.3 F (36.8 C)  Recent Labs  Lab 12/09/22 2331 12/09/22 2339 12/10/22 0200 12/10/22 0300 12/10/22 0307 12/10/22 0518 12/10/22 0519 12/10/22 0829 12/10/22 1300 12/11/22 0020 12/11/22 0220 12/12/22 0146  WBC 8.9  --   --   --   --   --   --  9.5  --  6.7  --  7.6  CREATININE 1.43*   < >  --  1.29*  --   --   --  1.16 1.17  --  1.15 0.94  LATICACIDVEN  --    < > 2.8*  --  2.6* 1.2 1.5 1.8  --   --   --   --    < > = values in this interval not displayed.    Estimated Creatinine Clearance: 57.6 mL/min (by C-G formula based on SCr of 0.94 mg/dL).    Allergies  Allergen Reactions   Procardia [Nifedipine] Other (See Comments)    Lowers bp    Phenergan [Promethazine Hcl] Nausea And Vomiting    Antimicrobials this admission: Cefepime 9/12 x 1 Flagyl 9/12 x 1  Vancomycin 9/12 > Zosyn 9/12>  Microbiology results: 9/12 BCx - ngtd 9/12 MRSA PCR - positive 9/12 UCx -   Ulyses Southward, PharmD, BCIDP, AAHIVP, CPP Infectious Disease Pharmacist 12/12/2022 7:27 AM

## 2022-12-12 NOTE — Progress Notes (Signed)
[facility] ADULT ICU REPLACEMENT PROTOCOL   The patient does apply for the [facility] Adult ICU Electrolyte Replacment Protocol based on the criteria listed below:   1.Exclusion criteria: TCTS, ECMO, Dialysis, and Myasthenia Gravis patients 2. Is GFR >/= 30 ml/min? Yes.    Patient's GFR today is >60 3. Is SCr </= 2? Yes.   Patient's SCr is 0.94 mg/dL 4. Did SCr increase >/= 0.5 in 24 hours? No. 5.Pt's weight >40kg  Yes.   6. Abnormal electrolyte(s): Potassium 3.2, Phosphorus 2.3, Magnesium 1.5  7. Electrolytes replaced per protocol 8.  Call MD STAT for K+ </= 2.5, Phos </= 1, or Mag </= 1 Physician:  Dr. Namon Cirri A Montanna Mcbain 12/12/2022 2:34 AM

## 2022-12-12 NOTE — Progress Notes (Signed)
PT Cancellation Note  Patient Details Name: Bradley Oconnor MRN: [id number] DOB: December 31, 1940   Cancelled Treatment:    Reason Eval/Treat Not Completed: Medical issues which prohibited therapy (BP remains low and pt on on pressors as well as c/o nausea today. Will follow up at later date/time as pt able and schedule allows.)   Renaldo Fiddler PT, DPT Acute Rehabilitation Services Office [phone]  12/12/22 3:35 PM

## 2022-12-12 NOTE — Progress Notes (Signed)
ANTICOAGULATION CONSULT NOTE- Follow Up  Pharmacy Consult for IV heparin Indication: pulmonary embolus and DVT  Allergies  Allergen Reactions   Procardia [Nifedipine] Other (See Comments)    Lowers bp    Phenergan [Promethazine Hcl] Nausea And Vomiting    Patient Measurements: Height: 5\' 8"  (172.7 cm) Weight: 69.6 kg (153 lb 7 oz) IBW/kg (Calculated) : 68.4 Heparin Dosing Weight: 70 kg  Vital Signs: Temp: 98 F (36.7 C) (09/13 2320) Temp Source: Oral (09/13 2320) Pulse Rate: 101 (09/14 0145)  Labs: Recent Labs    12/09/22 2331 12/10/22 0159 12/10/22 0300 12/10/22 0829 12/10/22 1300 12/10/22 2218 12/11/22 0020 12/11/22 0220 12/11/22 0900 12/11/22 0917 12/11/22 1830 12/12/22 0146  HGB 10.7*   < >  --  8.8*  --   --  7.7*  --   --  8.5*  --  8.3*  HCT 34.7*   < >  --  27.6*  --   --  23.1*  --   --  25.0*  --  25.0*  PLT 327  --   --  274  --   --  223  --   --   --   --  209  APTT  --   --  28  --   --   --   --   --   --   --   --   --   LABPROT  --   --  19.3*  --   --   --   --   --   --   --   --   --   INR  --   --  1.6*  --   --   --   --   --   --   --   --   --   HEPARINUNFRC  --   --   --   --  >1.10*   < > 0.82*  --  >1.10*  --  0.85* 0.63  CREATININE 1.43*   < > 1.29* 1.16 1.17  --   --  1.15  --   --   --   --   TROPONINIHS 67*  --  62*  --   --   --   --  117*  --   --   --   --    < > = values in this interval not displayed.    Estimated Creatinine Clearance: 47.1 mL/min (by C-G formula based on SCr of 1.15 mg/dL).   Assessment: Bradley Oconnor is a 82 y.o. year old male admitted on 12/09/2022 with R PE and DVT.  Pharmacy consulted to dose heparin.  Heparin level still supra-therapeutic, but trending down at 0.85  9/14 AM: heparin level returned at 0.63 on 400 units/hr (therapeutic). No signs/symptoms of bleeding or issues with the heparin infusion per RN. Last CBC shows Hgb low, stable and plts WNL  Goal of Therapy:  Heparin level 0.3-0.7  units/ml Monitor platelets by anticoagulation protocol: Yes   Plan:  Continue heparin at 400 units / hr Check 8 hr confirmatory heparin level Daily heparin level and CBC  Thank you,  Arabella Merles, PharmD. Clinical Pharmacist 12/12/2022 2:23 AM

## 2022-12-12 NOTE — Progress Notes (Signed)
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  Neurological but a decline neurology visit in summer 2023].  Other medical problems include history of anemia, coronary artery disease, acid reflux, hyperlipidemia.  At some point in the last he has become completely bedbound although neurologically intact and oriented.  He is able to self feed.  Her most recent ER visit is August 2024 for dark urine and possible UTI.  He has a caretaker in his house and has episodes of incontinence but was able to converse and is mostly bedbound.   Then presented 12/09/2022 with history of confusion for the last 3-5 days and increased confusion on 12/09/2022 with a history of starting ciprofloxacin 2 days prior to presentation for possible UTI.  In the ED blood pressures were soft but despite 3 L of fluid required vasopressors Levophed through peripheral line.  Hypotension got worse after giving Ativan for some restlessness prior to CT scan.   Had significant lab abnormalities and this includes mild hypokalemia at presentation associated with severe low bicarbonate of 10 suggestive of metabolic acidosis, severe hypocalcemia [6 mg albumin uncorrected], elevated anion gap of 21 [associated albumin 2.0] , severe hypomagnesemia less than 0.5 mg percent, lactic acidosis 3.3 mg percent upon presentation but also ongoing mild anemia but normal white count   Urinalysis with positive urine ketones but no white cells   CT head normal   Last echocardiogram 2018 with grade 1 diastolic dysfunction.  Whole-body CT scan with  right lower lobe distal PE without RV strain and right lower extremity DVT.   Social: wife died recently  Pertinent  Medical History   Past Medical History:  Diagnosis Date   Blood transfusion without reported diagnosis    CAD (coronary artery disease)    GERD (gastroesophageal reflux disease)    Heart attack (HCC)    Hyperlipidemia    Hypertension 03/07/12   ECHO-WNL     08/12/11 Lexiscan MyoviewNo significant ischemia demonstrated Low risk scan There is a moderate sized dense scar in the LCX territoy unchanged from the prior study.. Post- stress EF is 40%.   Thyroid disease    Significant Hospital Events: Including procedures, antibiotic start and stop dates in addition to other pertinent events   9/12 admitted, CVL and A-line placed, EEG performed   Interim History / Subjective:  Patient is feeling better He is feeling tired and weak, he has lost significant amount of weight  No chest or abdominal pain  Objective   Blood pressure 100/66, pulse 93, temperature 98.3 F (36.8 C), temperature source Oral, resp. rate 18, height 5\' 8"  (1.727 m), weight 69.6 kg, SpO2 100%.        Intake/Output Summary (Last 24 hours) at 12/12/2022 0701 Last data filed at 12/12/2022 0600 Gross per 24 hour  Intake 2501.39 ml  Output 1250 ml  Net 1251.39 ml   Filed Weights   12/10/22 0701 12/11/22 0705 12/12/22 0149  Weight: 69.6 kg 70.4 kg 69.6 kg   Examination: General: elderly male, tired and ill appearing HENT: [place]/AT, moist mucous membranes, sclera anicteric Lungs: clear  to auscultation, no wheezing Cardiovascular: irregularly irregular, no murmurs Abdomen: soft, non-tender, BS+ Extremities: warm, no edema, ecchymoses RUE Neuro: alert and oriented, moving all extremities GU: foley in place  Resolved Hospital Problem list   Severe High Anion Gap Metabolic Acidosis  Assessment & Plan:  Septic Shock Bilateral Lower Lobe Pneumonia, POA Possible UTI, POA - continue vancomycin and  zosyn - follow up blood, urine and sputum cultures - continue vasopressor support for MAP 65 or greater - albumin 50g TID x 3 doses - start midodrine 5mg  TID - stress dose steroids to be completed tomorrow morning  Acute Metabolic Encephalopathy - improving - CT head without acute abnormality - EEG consistent with encephalopathy  Acute Pulmonary Emboli Acute Right Lower Extremity DVT, common femoral vein - continue heparin - plan to transition to eliquis in future  Acute Kidney Injury Hypokalemia - monitor UOP and Cr  Paroxysmal Atrial Fibrillation Coronary Artery Disease - continue heparin drip  Anemia of Critical Illness - monitor, transfuse for hemoglobin < 7g/dL  Best Practice (right click and "Reselect all SmartList Selections" daily)   Diet/type: Regular consistency (see orders) DVT prophylaxis: systemic heparin GI prophylaxis: PPI Lines: Central line, Arterial Line, and yes and it is still needed Foley:  Yes, and it is still needed Code Status:  full code Last date of multidisciplinary goals of care discussion [9/12]  Labs   CBC: Recent Labs  Lab 12/09/22 2331 12/10/22 0159 12/10/22 0829 12/11/22 0020 12/11/22 0917 12/12/22 0146  WBC 8.9  --  9.5 6.7  --  7.6  NEUTROABS 6.8  --   --   --   --   --   HGB 10.7* 9.2* 8.8* 7.7* 8.5* 8.3*  HCT 34.7* 27.0* 27.6* 23.1* 25.0* 25.0*  MCV 99.4  --  96.8 95.1  --  95.1  PLT 327  --  274 223  --  209    Basic Metabolic Panel: Recent Labs  Lab 12/09/22 2331 12/10/22 0159 12/10/22 0300 12/10/22 0829 12/10/22 1300 12/11/22 0220 12/11/22 0917 12/12/22 0146  NA 139   < > 140 141 138 137 138 137  K 3.9   < > 3.0* 3.6 4.0 2.9* 3.7 3.2*  CL 108   < > 113* 110 108 105  --  101  CO2 10*  --  10* 13* 13* 21*  --  26  GLUCOSE 58*   < > 82 159* 232* 168*  --  121*  BUN 21   < > 19 14 13 12   --  8  CREATININE 1.43*   < > 1.29* 1.16 1.17 1.15  --  0.94  CALCIUM 6.0*  --  5.5* 6.0* 6.7* 6.1*  --  7.0*  MG <0.5*   --  1.1* 2.6* 2.0  --   --  1.5*  PHOS  --   --  3.0 2.7  --   --   --  2.3*   < > = values in this interval not displayed.   GFR: Estimated Creatinine Clearance: 57.6 mL/min (by C-G formula based on SCr of 0.94 mg/dL). Recent Labs  Lab 12/09/22 2331 12/09/22 2339 12/10/22 0307 12/10/22 0518 12/10/22 0519 12/10/22 0829 12/11/22 0020 12/12/22 0146  PROCALCITON  --   --   --   --  <0.10  --  <0.10  --   WBC 8.9  --   --   --   --  9.5 6.7 7.6  LATICACIDVEN  --    < > 2.6*  1.2 1.5 1.8  --   --    < > = values in this interval not displayed.    Liver Function Tests: Recent Labs  Lab 12/09/22 2331 12/10/22 0829  AST 28 24  ALT 19 16  ALKPHOS 64 61  BILITOT 1.3* 1.3*  PROT 5.6* 4.6*  ALBUMIN 2.0* 1.8*   No results for input(s): "LIPASE", "AMYLASE" in the last 168 hours. Recent Labs  Lab 12/10/22 0519 12/11/22 0020  AMMONIA 10 <10    ABG    Component Value Date/Time   PHART 7.556 (H) 12/11/2022 0917   PCO2ART 26.7 (L) 12/11/2022 0917   PO2ART 82 (L) 12/11/2022 0917   HCO3 23.8 12/11/2022 0917   TCO2 25 12/11/2022 0917   O2SAT 98 12/11/2022 0917     Coagulation Profile: Recent Labs  Lab 12/10/22 0300  INR 1.6*    Cardiac Enzymes: No results for input(s): "CKTOTAL", "CKMB", "CKMBINDEX", "TROPONINI" in the last 168 hours.  HbA1C: Hemoglobin A1C  Date/Time Value Ref Range Status  08/14/2019 12:00 PM 6.1 (A) 4.0 - 5.6 % Final   Hgb A1c MFr Bld  Date/Time Value Ref Range Status  03/03/2021 12:14 PM 6.2 4.6 - 6.5 % Final    Comment:    Glycemic Control Guidelines for People with Diabetes:Non Diabetic:  <6%Goal of Therapy: <7%Additional Action Suggested:  >8%     CBG: Recent Labs  Lab 12/11/22 1133 12/11/22 1531 12/11/22 1904 12/11/22 2317 12/12/22 0313  GLUCAP 153* 154* 104* 120* 146*     Critical care time: 32 minutes    Melody Comas, MD [facility] Office: [phone]   See Amion for personal pager PCCM on  call pager [phone] until 7pm. Please call Elink 7p-7a. [phone]

## 2022-12-12 NOTE — Progress Notes (Signed)
ANTICOAGULATION CONSULT NOTE- Follow Up  Pharmacy Consult for IV heparin Indication: pulmonary embolus and DVT  Allergies  Allergen Reactions   Procardia [Nifedipine] Other (See Comments)    Lowers bp    Phenergan [Promethazine Hcl] Nausea And Vomiting    Patient Measurements: Height: 5\' 8"  (172.7 cm) Weight: 69.6 kg (153 lb 7 oz) IBW/kg (Calculated) : 68.4 Heparin Dosing Weight: 70 kg  Vital Signs: Temp: 98.2 F (36.8 C) (09/14 1100) Temp Source: Axillary (09/14 1100) Pulse Rate: 97 (09/14 0930)  Labs: Recent Labs    12/09/22 2331 12/10/22 0159 12/10/22 0300 12/10/22 0829 12/10/22 1300 12/10/22 2218 12/11/22 0020 12/11/22 0220 12/11/22 0900 12/11/22 0917 12/11/22 1830 12/12/22 0146 12/12/22 1111  HGB 10.7*   < >  --  8.8*  --   --  7.7*  --   --  8.5*  --  8.3*  --   HCT 34.7*   < >  --  27.6*  --   --  23.1*  --   --  25.0*  --  25.0*  --   PLT 327  --   --  274  --   --  223  --   --   --   --  209  --   APTT  --   --  28  --   --   --   --   --   --   --   --   --   --   LABPROT  --   --  19.3*  --   --   --   --   --   --   --   --   --   --   INR  --   --  1.6*  --   --   --   --   --   --   --   --   --   --   HEPARINUNFRC  --   --   --   --  >1.10*   < > 0.82*  --    < >  --  0.85* 0.63 0.38  CREATININE 1.43*   < > 1.29* 1.16 1.17  --   --  1.15  --   --   --  0.94  --   TROPONINIHS 67*  --  62*  --   --   --   --  117*  --   --   --   --   --    < > = values in this interval not displayed.    Estimated Creatinine Clearance: 57.6 mL/min (by C-G formula based on SCr of 0.94 mg/dL).   Assessment: Bradley Oconnor is a 83 y.o. year old male admitted on 12/09/2022 with R PE and DVT.  Pharmacy consulted to dose heparin.  Heparin level still supra-therapeutic, but trending down at 0.85  Heparin continue to be therapeutic this AM.  Goal of Therapy:  Heparin level 0.3-0.7 units/ml Monitor platelets by anticoagulation protocol: Yes   Plan:  Continue  heparin at 400 units / hr Daily heparin level and CBC  Ulyses Southward, PharmD, BCIDP, AAHIVP, CPP Infectious Disease Pharmacist 12/12/2022 12:10 PM

## 2022-12-13 DIAGNOSIS — R6521 Severe sepsis with septic shock: Secondary | ICD-10-CM | POA: Diagnosis not present

## 2022-12-13 DIAGNOSIS — A419 Sepsis, unspecified organism: Secondary | ICD-10-CM | POA: Diagnosis not present

## 2022-12-13 LAB — CBC
HCT: 20.3 % — ABNORMAL LOW (ref 39.0–52.0)
HCT: 26.4 % — ABNORMAL LOW (ref 39.0–52.0)
Hemoglobin: 6.7 g/dL — CL (ref 13.0–17.0)
Hemoglobin: 8.6 g/dL — ABNORMAL LOW (ref 13.0–17.0)
MCH: 31.9 pg (ref 26.0–34.0)
MCH: 30.4 pg (ref 26.0–34.0)
MCHC: 33 g/dL (ref 30.0–36.0)
MCHC: 32.6 g/dL (ref 30.0–36.0)
MCV: 96.7 fL (ref 80.0–100.0)
MCV: 93.3 fL (ref 80.0–100.0)
Platelets: 130 10*3/uL — ABNORMAL LOW (ref 150–400)
Platelets: 145 10*3/uL — ABNORMAL LOW (ref 150–400)
RBC: 2.1 MIL/uL — ABNORMAL LOW (ref 4.22–5.81)
RBC: 2.83 MIL/uL — ABNORMAL LOW (ref 4.22–5.81)
RDW: 16.9 % — ABNORMAL HIGH (ref 11.5–15.5)
RDW: 19.4 % — ABNORMAL HIGH (ref 11.5–15.5)
WBC: 4.7 10*3/uL (ref 4.0–10.5)
WBC: 6.1 10*3/uL (ref 4.0–10.5)
nRBC: 0 % (ref 0.0–0.2)
nRBC: 0 % (ref 0.0–0.2)

## 2022-12-13 LAB — MAGNESIUM: Magnesium: 1.9 mg/dL (ref 1.7–2.4)

## 2022-12-13 LAB — GLUCOSE, CAPILLARY
Glucose-Capillary: 128 mg/dL — ABNORMAL HIGH (ref 70–99)
Glucose-Capillary: 106 mg/dL — ABNORMAL HIGH (ref 70–99)
Glucose-Capillary: 146 mg/dL — ABNORMAL HIGH (ref 70–99)
Glucose-Capillary: 155 mg/dL — ABNORMAL HIGH (ref 70–99)
Glucose-Capillary: 94 mg/dL (ref 70–99)

## 2022-12-13 LAB — BASIC METABOLIC PANEL
Anion gap: 13 (ref 5–15)
BUN: 7 mg/dL — ABNORMAL LOW (ref 8–23)
CO2: 24 mmol/L (ref 22–32)
Calcium: 7.3 mg/dL — ABNORMAL LOW (ref 8.9–10.3)
Chloride: 102 mmol/L (ref 98–111)
Creatinine, Ser: 0.84 mg/dL (ref 0.61–1.24)
GFR, Estimated: >60 mL/min (ref >60)
Glucose, Bld: 130 mg/dL — ABNORMAL HIGH (ref 70–99)
Potassium: 2.9 mmol/L — ABNORMAL LOW (ref 3.5–5.1)
Sodium: 139 mmol/L (ref 135–145)

## 2022-12-13 LAB — ABO/RH: ABO/RH(D): A NEG

## 2022-12-13 LAB — HEPARIN LEVEL (UNFRACTIONATED)
Heparin Unfractionated: 0.12 [IU]/mL — ABNORMAL LOW (ref 0.30–0.70)
Heparin Unfractionated: <0.1 [IU]/mL — ABNORMAL LOW (ref 0.30–0.70)

## 2022-12-13 LAB — PHOSPHORUS: Phosphorus: 3 mg/dL (ref 2.5–4.6)

## 2022-12-13 LAB — PREPARE RBC (CROSSMATCH)

## 2022-12-13 MED ORDER — POTASSIUM CHLORIDE 10 MEQ/50ML IV SOLN
10.0000 meq | INTRAVENOUS | Status: AC
  Administered 2022-12-13 (×8): 10 meq via INTRAVENOUS
  Filled 2022-12-13 (×8): qty 50

## 2022-12-13 MED ORDER — ORAL CARE MOUTH RINSE: 15.0000 mL | OROMUCOSAL | Status: DC | PRN

## 2022-12-13 MED ORDER — SODIUM CHLORIDE 0.9% IV SOLUTION: Freq: Once | INTRAVENOUS | Status: DC

## 2022-12-13 MED ORDER — MAGNESIUM SULFATE 2 GM/50ML IV SOLN
2.0000 g | Freq: Once | INTRAVENOUS | Status: AC
  Administered 2022-12-13: 2 g via INTRAVENOUS
  Filled 2022-12-13: qty 50

## 2022-12-13 MED ORDER — OXIDIZED CELLULOSE EX PADS
1.0000 | MEDICATED_PAD | Freq: Once | CUTANEOUS | Status: DC
  Filled 2022-12-13: qty 1

## 2022-12-13 NOTE — Evaluation (Signed)
Physical Therapy Evaluation Patient Details Name: Bradley Oconnor MRN: [id number] DOB: 1940/09/04 Today's Date: 12/13/2022  History of Present Illness  Patient is 82 y.o. male presented 12/09/22 with confusion for 3-5 days and increased confusion on 9/11. PT recently started ciprofloxacin 2 days PTA for possible UTI. In the ED blood pressures soft and despite 3 L of fluid pt required vasopressors. Whole body CT showed Rt lower lob PE and Rt LE DVT.  Clinical Impression  Patient presents with decreased mobility due to decreased activity tolerance, decreased balance, decreased strength and he will benefit from skilled PT in the acute setting to allow d/c home with 24 hour caregivers and follow up HHPT.  Currently mod A of 2 for rolling using rails with c/o pain in his hips and with R shoulder pain.  Previously pt having difficulty even self feeding due to tremors and limited appetite per his brother.  Also limited with sitting EOB due to BP dropping.  Feel he will benefit from resuming HHPT at d/c.  Will need ambulance transport home as well.       If plan is discharge home, recommend the following: Supervision due to cognitive status;Direct supervision/assist for medications management;Assistance with feeding;Assist for transportation;Assistance with cooking/housework;A lot of help with bathing/dressing/bathroom;Two people to help with walking and/or transfers   Can travel by private vehicle        Equipment Recommendations None recommended by PT  Recommendations for Other Services       Functional Status Assessment Patient has had a recent decline in their functional status and/or demonstrates limited ability to make significant improvements in function in a reasonable and predictable amount of time     Precautions / Restrictions Precautions Precautions: Fall      Mobility  Bed Mobility Overal bed mobility: Needs Assistance Bed Mobility: Rolling Rolling: Mod assist, +2 for physical  assistance         General bed mobility comments: rolling in bed for cleaning BM and changing bed pad, assist for scooting up in bed and bed brought up into chair position for BP check    Transfers                        Ambulation/Gait                  Stairs            Wheelchair Mobility     Tilt Bed    Modified Rankin (Stroke Patients Only)       Balance Overall balance assessment: Needs assistance   Sitting balance-Leahy Scale: Poor Sitting balance - Comments: not truly tested as pt in chair position, but able to pull forward lifting back minimally off bed using bilateral rails x 5 reps                                     Pertinent Vitals/Pain Pain Assessment Pain Assessment: No/denies pain    Home Living Family/patient expects to be discharged to:: Private residence   Available Help at Discharge: Available 24 hours/day;Personal care attendant;Family Type of Home: House Home Access: Ramped entrance       Home Layout: One level Home Equipment: Wheelchair - manual;Wheelchair - power;Other (comment);Hospital bed;BSC/3in1 (hoyer lift)      Prior Function Prior Level of Function : Needs assist  Mobility Comments: has BP drop in sitting ADLs Comments: can self feed, but has tremors in R hand, has no appetite so gets help and cues to increase intake     Extremity/Trunk Assessment   Upper Extremity Assessment Upper Extremity Assessment: LUE deficits/detail;RUE deficits/detail RUE Deficits / Details: AAROM limited with pain on shoulder elevation, able to flex approx 75 degrees though limited by pain not end feel; weaker grip compared to L though both weak, intact finger nose finger though only with elbow motion RUE Sensation: decreased light touch LUE Deficits / Details: AROM WFL, strength grossly 4-/5 LUE Sensation: decreased light touch    Lower Extremity Assessment Lower Extremity Assessment: LLE  deficits/detail;RLE deficits/detail RLE Deficits / Details: AAROM grossly WFL, strength hip flexion 2/10, knee extension 3-/5, ankle DF 3-/5 RLE Sensation: decreased light touch LLE Deficits / Details: AAROM WFL, strength hip flexion 2+/5, knee extension 3/5, ankle DF 3+/5 LLE Sensation: decreased light touch    Cervical / Trunk Assessment Cervical / Trunk Assessment: Kyphotic  Communication   Communication Communication: No apparent difficulties  Cognition Arousal: Alert Behavior During Therapy: WFL for tasks assessed/performed Overall Cognitive Status: Impaired/Different from baseline Area of Impairment: Orientation, Attention, Following commands, Safety/judgement, Problem solving                 Orientation Level: Disoriented to, Place, Time, Situation Current Attention Level: Sustained   Following Commands: Follows one step commands consistently, Follows one step commands with increased time Safety/Judgement: Decreased awareness of safety, Decreased awareness of deficits   Problem Solving: Slow processing, Requires verbal cues, Requires tactile cues          General Comments General comments (skin integrity, edema, etc.): Brother in the room from [place]; states he comes on the weekends and pt has hired caregivers through the week; reports bad UTI with urine the color of cola, pt not eating or drinking much at all at home and difficulty with decreased cogntition from illness.  States they buried his wife in June just after pt had Covid and was intubated and has not been mobile since, had decreased BP on EOB at home with PT and had started with HHPT 2x/wk.    Exercises Other Exercises Other Exercises: SAQ x 10 in supine Other Exercises: trunk flexion sitting in chair position pulling up with both rails x 5   Assessment/Plan    PT Assessment Patient needs continued PT services  PT Problem List Decreased strength;Decreased activity tolerance;Decreased  balance;Decreased mobility;Decreased cognition;Decreased safety awareness       PT Treatment Interventions DME instruction;Cognitive remediation;Patient/family education;Functional mobility training;Therapeutic activities;Therapeutic exercise;Balance training    PT Goals (Current goals can be found in the Care Plan section)  Acute Rehab PT Goals Patient Stated Goal: return home, get stronger PT Goal Formulation: With family Time For Goal Achievement: 12/27/22 Potential to Achieve Goals: Fair    Frequency Min 1X/week     Co-evaluation               AM-PAC PT "6 Clicks" Mobility  Outcome Measure Help needed turning from your back to your side while in a flat bed without using bedrails?: Total Help needed moving from lying on your back to sitting on the side of a flat bed without using bedrails?: Total Help needed moving to and from a bed to a chair (including a wheelchair)?: Total Help needed standing up from a chair using your arms (e.g., wheelchair or bedside chair)?: Total Help needed to walk in hospital room?: Total  Help needed climbing 3-5 steps with a railing? : Total 6 Click Score: 6    End of Session   Activity Tolerance: Patient tolerated treatment well Patient left: in bed;with call bell/phone within reach   PT Visit Diagnosis: Muscle weakness (generalized) (M62.81);Other symptoms and signs involving the nervous system (R29.898)    Time: 4098-1191 PT Time Calculation (min) (ACUTE ONLY): 28 min   Charges:   PT Evaluation $PT Eval Moderate Complexity: 1 Mod PT Treatments $Therapeutic Activity: 8-22 mins PT General Charges $$ ACUTE PT VISIT: 1 Visit         Sheran Lawless, PT Acute Rehabilitation Services [phone] 12/13/2022   Elray Mcgregor 12/13/2022, 4:21 PM

## 2022-12-13 NOTE — Progress Notes (Signed)
ANTICOAGULATION CONSULT NOTE- Follow Up  Pharmacy Consult for IV heparin Indication: pulmonary embolus and DVT  Allergies  Allergen Reactions   Procardia [Nifedipine] Other (See Comments)    Lowers bp    Phenergan [Promethazine Hcl] Nausea And Vomiting    Patient Measurements: Height: 5\' 8"  (172.7 cm) Weight: 69.6 kg (153 lb 7 oz) IBW/kg (Calculated) : 68.4 Heparin Dosing Weight: 70 kg  Vital Signs: Temp: 97.7 F (36.5 C) (09/15 1934) Temp Source: Axillary (09/15 1934) BP: 77/43 (09/15 2030) Pulse Rate: 93 (09/15 2030)  Labs: Recent Labs    12/11/22 0220 12/11/22 0900 12/12/22 0146 12/12/22 1111 12/13/22 0331 12/13/22 0945 12/13/22 1840  HGB  --    < > 8.3*  --  6.7* 8.6*  --   HCT  --    < > 25.0*  --  20.3* 26.4*  --   PLT  --   --  209  --  130* 145*  --   HEPARINUNFRC  --    < > 0.63 0.38 0.12*  --  <0.10*  CREATININE 1.15  --  0.94  --  0.84  --   --   TROPONINIHS 117*  --   --   --   --   --   --    < > = values in this interval not displayed.    Estimated Creatinine Clearance: 64.5 mL/min (by C-G formula based on SCr of 0.84 mg/dL).   Assessment: 82 y.o. year old male admitted on 12/09/2022 with R PE and DVT.  Pharmacy consulted to dose heparin.  Heparin level now down to 0.12 (subtherapeutic) on infusion at 400 units/hr. Pt has been supratherapeutic most of this admission.   Pt bleeding from old IV site, Hgb down to 6.7, plt down to 130. MD ordered PRBC x 1 and to hold heparin gtt    Post PRBC  (hgb 8.6), oozing improved> resume heparin drip 400 uts/hr with heparin level < 0.1 tonight - increase slowly   Goal of Therapy:  Heparin level: 0.3-0.5 Monitor platelets by anticoagulation protocol: Yes   Plan:  Increase  heparin drip rate 500 units/hr Daily cbc and heparin level  Monitor bleeding    Leota Sauers Pharm.D. CPP, BCPS Clinical Pharmacist [phone] 12/13/2022 8:39 PM

## 2022-12-13 NOTE — Progress Notes (Signed)
ANTICOAGULATION CONSULT NOTE- Follow Up  Pharmacy Consult for IV heparin Indication: pulmonary embolus and DVT  Allergies  Allergen Reactions   Procardia [Nifedipine] Other (See Comments)    Lowers bp    Phenergan [Promethazine Hcl] Nausea And Vomiting    Patient Measurements: Height: 5\' 8"  (172.7 cm) Weight: 69.6 kg (153 lb 7 oz) IBW/kg (Calculated) : 68.4 Heparin Dosing Weight: 70 kg  Vital Signs: Temp: 97.9 F (36.6 C) (09/15 1122) Temp Source: Oral (09/15 1122) Pulse Rate: 99 (09/15 1045)  Labs: Recent Labs    12/11/22 0220 12/11/22 0900 12/12/22 0146 12/12/22 1111 12/13/22 0331 12/13/22 0945  HGB  --    < > 8.3*  --  6.7* 8.6*  HCT  --    < > 25.0*  --  20.3* 26.4*  PLT  --   --  209  --  130* 145*  HEPARINUNFRC  --    < > 0.63 0.38 0.12*  --   CREATININE 1.15  --  0.94  --  0.84  --   TROPONINIHS 117*  --   --   --   --   --    < > = values in this interval not displayed.    Estimated Creatinine Clearance: 64.5 mL/min (by C-G formula based on SCr of 0.84 mg/dL).   Assessment: 82 y.o. year old male admitted on 12/09/2022 with R PE and DVT.  Pharmacy consulted to dose heparin.  Heparin level now down to 0.12 (subtherapeutic) on infusion at 400 units/hr. Pt has been supratherapeutic most of this admission.   Pt bleeding from old IV site, Hgb down to 6.7, plt down to 130. MD ordered PRBC x 1 and to hold heparin gtt.   Repeat CBC is now stable (hgb 8.6). Ok to resume heparin. We will use lower goal.  Goal of Therapy:  Heparin level: 0.3-0.5 Monitor platelets by anticoagulation protocol: Yes   Plan:  Resume heparin at 400 units/hr Check 6 hr HL F/u bleeding  Ulyses Southward, PharmD, BCIDP, AAHIVP, CPP Infectious Disease Pharmacist 12/13/2022 12:11 PM

## 2022-12-13 NOTE — Progress Notes (Signed)
[facility] ADULT ICU REPLACEMENT PROTOCOL   The patient does apply for the [facility] Adult ICU Electrolyte Replacment Protocol based on the criteria listed below:   1.Exclusion criteria: TCTS, ECMO, Dialysis, and Myasthenia Gravis patients 2. Is GFR >/= 30 ml/min? Yes.    Patient's GFR today is >60 3. Is SCr </= 2? Yes.   Patient's SCr is 0.84 mg/dL 4. Did SCr increase >/= 0.5 in 24 hours? No. 5.Pt's weight >40kg  Yes.   6. Abnormal electrolyte(s): K+ 2.9, Mag 1.9  7. Electrolytes replaced per protocol 8.  Call MD STAT for K+ </= 2.5, Phos </= 1, or Mag </= 1 Physician:  Dr. Vladimir Faster  Lolita Lenz 12/13/2022 5:04 AM

## 2022-12-13 NOTE — Progress Notes (Signed)
ANTICOAGULATION CONSULT NOTE- Follow Up  Pharmacy Consult for IV heparin Indication: pulmonary embolus and DVT  Allergies  Allergen Reactions   Procardia [Nifedipine] Other (See Comments)    Lowers bp    Phenergan [Promethazine Hcl] Nausea And Vomiting    Patient Measurements: Height: 5\' 8"  (172.7 cm) Weight: 69.6 kg (153 lb 7 oz) IBW/kg (Calculated) : 68.4 Heparin Dosing Weight: 70 kg  Vital Signs: Temp: 98.1 F (36.7 C) (09/14 2334) Temp Source: Oral (09/14 2334) Pulse Rate: 88 (09/15 0415)  Labs: Recent Labs    12/11/22 0020 12/11/22 0220 12/11/22 0900 12/11/22 0917 12/11/22 1830 12/12/22 0146 12/12/22 1111 12/13/22 0331  HGB 7.7*  --   --  8.5*  --  8.3*  --  6.7*  HCT 23.1*  --   --  25.0*  --  25.0*  --  20.3*  PLT 223  --   --   --   --  209  --  130*  HEPARINUNFRC 0.82*  --    < >  --    < > 0.63 0.38 0.12*  CREATININE  --  1.15  --   --   --  0.94  --  0.84  TROPONINIHS  --  117*  --   --   --   --   --   --    < > = values in this interval not displayed.    Estimated Creatinine Clearance: 64.5 mL/min (by C-G formula based on SCr of 0.84 mg/dL).   Assessment: 82 y.o. year old male admitted on 12/09/2022 with R PE and DVT.  Pharmacy consulted to dose heparin.  Heparin level now down to 0.12 (subtherapeutic) on infusion at 400 units/hr. Pt has been supratherapeutic most of this admission.   Pt bleeding from old IV site, Hgb down to 6.7, plt down to 130. MD ordered PRBC x 1 and to hold heparin gtt.   Goal of Therapy:  Heparin level 0.3-0.7 units/ml Monitor platelets by anticoagulation protocol: Yes   Plan:  Will f/u ability to restart heparin gtt  Christoper Fabian, PharmD, BCPS Please see amion for complete clinical pharmacist phone list 12/13/2022 4:33 AM

## 2022-12-13 NOTE — Progress Notes (Signed)
Date and time results received: 12/13/22 0355 (use smartphrase ".now" to insert current time)  Test: Hgb Critical Value: 6.7  Name of Provider Notified: Elink Provider  Orders Received? Or Actions Taken?:  See order profile

## 2022-12-13 NOTE — Plan of Care (Signed)

## 2022-12-13 NOTE — Progress Notes (Addendum)
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  Neurological but a decline neurology visit in summer 2023].  Other medical problems include history of anemia, coronary artery disease, acid reflux, hyperlipidemia.  At some point in the last he has become completely bedbound although neurologically intact and oriented.  He is able to self feed.  Her most recent ER visit is August 2024 for dark urine and possible UTI.  He has a caretaker in his house and has episodes of incontinence but was able to converse and is mostly bedbound.   Then presented 12/09/2022 with history of confusion for the last 3-5 days and increased confusion on 12/09/2022 with a history of starting ciprofloxacin 2 days prior to presentation for possible UTI.  In the ED blood pressures were soft but despite 3 L of fluid required vasopressors Levophed through peripheral line.  Hypotension got worse after giving Ativan for some restlessness prior to CT scan.   Had significant lab abnormalities and this includes mild hypokalemia at presentation associated with severe low bicarbonate of 10 suggestive of metabolic acidosis, severe hypocalcemia [6 mg albumin uncorrected], elevated anion gap of 21 [associated albumin 2.0] , severe hypomagnesemia less than 0.5 mg percent, lactic acidosis 3.3 mg percent upon presentation but also ongoing mild anemia but normal white count   Urinalysis with positive urine ketones but no white cells   CT head normal   Last echocardiogram 2018 with grade 1 diastolic dysfunction.  Whole-body CT scan with  right lower lobe distal PE without RV strain and right lower extremity DVT.   Social: wife died recently  Pertinent  Medical History   Past Medical History:  Diagnosis Date   Blood transfusion without reported diagnosis    CAD (coronary artery disease)    GERD (gastroesophageal reflux disease)    Heart attack (HCC)    Hyperlipidemia    Hypertension 03/07/12   ECHO-WNL     08/12/11 Lexiscan MyoviewNo significant ischemia demonstrated Low risk scan There is a moderate sized dense scar in the LCX territoy unchanged from the prior study.. Post- stress EF is 40%.   Thyroid disease    Significant Hospital Events: Including procedures, antibiotic start and stop dates in addition to other pertinent events   9/12 admitted, CVL and A-line placed, EEG performed   Interim History / Subjective:  Intermittent confusion per nursing Weaned off levophed Hemoglobin dropped overnight, transfused 1 unit PRBCs - heparin on hold until CBC recheck  Started on D5 for hypoglycemia  Objective   Blood pressure 100/66, pulse 96, temperature (!) 97.4 F (36.3 C), temperature source Oral, resp. rate (!) 21, height 5\' 8"  (1.727 m), weight 69.6 kg, SpO2 100%.        Intake/Output Summary (Last 24 hours) at 12/13/2022 0824 Last data filed at 12/13/2022 0805 Gross per 24 hour  Intake 1809.29 ml  Output 1200 ml  Net 609.29 ml   Filed Weights   12/11/22 0705 12/12/22 0149 12/13/22 0128  Weight: 70.4 kg 69.6 kg 69.6 kg   Examination: General: elderly male, tired and ill appearing HENT: [place]/AT, moist  mucous membranes, sclera anicteric Lungs: clear to auscultation, no wheezing Cardiovascular: irregularly irregular, no murmurs Abdomen: soft, non-tender, BS+ Extremities: warm, no edema, ecchymoses RUE Neuro: alert and oriented, moving all extremities GU: foley in place  Resolved Hospital Problem list   Severe High Anion Gap Metabolic Acidosis  Assessment & Plan:  Septic Shock Bilateral Lower Lobe  Pneumonia, POA Possible UTI, POA - continue zosyn for 7 day course - blood cultures no growth to date - continue midodrine 5mg  TID - stress dose steroids stopped yesterday  Acute Metabolic Encephalopathy and delirium - mental status waxes/wanes - CT head without acute abnormality - EEG consistent with encephalopathy  Acute Pulmonary Emboli Acute Right Lower Extremity DVT, common femoral vein - will resume heparin after CBC recheck this AM if stable - plan to transition to eliquis in future  Acute Kidney Injury Hypokalemia - monitor UOP and Cr - replete potassium  Paroxysmal Atrial Fibrillation Coronary Artery Disease - continue heparin drip  Anemia of Critical Illness Bleeding from IV sites - monitor, transfuse for hemoglobin < 7g/dL - follow up CBC after transfusion  Moderate protein calorie malnutrition Hypoglycemia - on regular diet with protein supplements - continue D5 for hypoglycemia  Best Practice (right click and "Reselect all SmartList Selections" daily)   Diet/type: Regular consistency (see orders) DVT prophylaxis: systemic heparin GI prophylaxis: PPI Lines: Central line, Arterial Line, and No longer needed.  Order written to d/c  Foley:  N/A Code Status:  full code Last date of multidisciplinary goals of care discussion [9/12]  Labs   CBC: Recent Labs  Lab 12/09/22 2331 12/10/22 0159 12/10/22 0829 12/11/22 0020 12/11/22 0917 12/12/22 0146 12/13/22 0331  WBC 8.9  --  9.5 6.7  --  7.6 4.7  NEUTROABS 6.8  --   --   --   --   --   --   HGB 10.7*   < > 8.8* 7.7* 8.5* 8.3* 6.7*  HCT 34.7*   < > 27.6* 23.1* 25.0* 25.0* 20.3*  MCV 99.4  --  96.8 95.1  --  95.1 96.7  PLT 327  --  274 223  --  209 130*   < > = values in this interval not displayed.    Basic Metabolic Panel: Recent Labs  Lab 12/10/22 0300 12/10/22 0829 12/10/22 1300 12/11/22 0220 12/11/22 0917 12/12/22 0146 12/13/22 0331  NA 140 141 138 137 138 137 139  K 3.0* 3.6 4.0 2.9*  3.7 3.2* 2.9*  CL 113* 110 108 105  --  101 102  CO2 10* 13* 13* 21*  --  26 24  GLUCOSE 82 159* 232* 168*  --  121* 130*  BUN 19 14 13 12   --  8 7*  CREATININE 1.29* 1.16 1.17 1.15  --  0.94 0.84  CALCIUM 5.5* 6.0* 6.7* 6.1*  --  7.0* 7.3*  MG 1.1* 2.6* 2.0  --   --  1.5* 1.9  PHOS 3.0 2.7  --   --   --  2.3* 3.0   GFR: Estimated Creatinine Clearance: 64.5 mL/min (by C-G formula based on SCr of 0.84 mg/dL). Recent Labs  Lab 12/10/22 0307 12/10/22 0518 12/10/22 0519 12/10/22 0829 12/11/22 0020 12/12/22 0146 12/13/22 0331  PROCALCITON  --   --  <0.10  --  <0.10  --   --   WBC  --   --   --  9.5 6.7 7.6 4.7  LATICACIDVEN 2.6* 1.2 1.5 1.8  --   --   --  Liver Function Tests: Recent Labs  Lab 12/09/22 2331 12/10/22 0829  AST 28 24  ALT 19 16  ALKPHOS 64 61  BILITOT 1.3* 1.3*  PROT 5.6* 4.6*  ALBUMIN 2.0* 1.8*   No results for input(s): "LIPASE", "AMYLASE" in the last 168 hours. Recent Labs  Lab 12/10/22 0519 12/11/22 0020  AMMONIA 10 <10    ABG    Component Value Date/Time   PHART 7.556 (H) 12/11/2022 0917   PCO2ART 26.7 (L) 12/11/2022 0917   PO2ART 82 (L) 12/11/2022 0917   HCO3 23.8 12/11/2022 0917   TCO2 25 12/11/2022 0917   O2SAT 98 12/11/2022 0917     Coagulation Profile: Recent Labs  Lab 12/10/22 0300  INR 1.6*    Cardiac Enzymes: No results for input(s): "CKTOTAL", "CKMB", "CKMBINDEX", "TROPONINI" in the last 168 hours.  HbA1C: Hemoglobin A1C  Date/Time Value Ref Range Status  08/14/2019 12:00 PM 6.1 (A) 4.0 - 5.6 % Final   Hgb A1c MFr Bld  Date/Time Value Ref Range Status  03/03/2021 12:14 PM 6.2 4.6 - 6.5 % Final    Comment:    Glycemic Control Guidelines for People with Diabetes:Non Diabetic:  <6%Goal of Therapy: <7%Additional Action Suggested:  >8%     CBG: Recent Labs  Lab 12/12/22 1537 12/12/22 1907 12/12/22 2332 12/13/22 0336 12/13/22 0710  GLUCAP 122* 88 116* 128* 106*     Critical care time: n/a    Melody Comas, MD [facility] Office: [phone]   See Amion for personal pager PCCM on call pager [phone] until 7pm. Please call Elink 7p-7a. [phone]

## 2022-12-14 DIAGNOSIS — R6521 Severe sepsis with septic shock: Secondary | ICD-10-CM | POA: Diagnosis not present

## 2022-12-14 DIAGNOSIS — A419 Sepsis, unspecified organism: Secondary | ICD-10-CM | POA: Diagnosis not present

## 2022-12-14 LAB — BPAM RBC
Blood Product Expiration Date: 202410062359
ISSUE DATE / TIME: 202409150552
Unit Type and Rh: 600

## 2022-12-14 LAB — TYPE AND SCREEN
ABO/RH(D): A NEG
Antibody Screen: NEGATIVE
Unit division: 0

## 2022-12-14 LAB — CBC
HCT: 26.1 % — ABNORMAL LOW (ref 39.0–52.0)
Hemoglobin: 8.7 g/dL — ABNORMAL LOW (ref 13.0–17.0)
MCH: 30.5 pg (ref 26.0–34.0)
MCHC: 33.3 g/dL (ref 30.0–36.0)
MCV: 91.6 fL (ref 80.0–100.0)
Platelets: 141 10*3/uL — ABNORMAL LOW (ref 150–400)
RBC: 2.85 MIL/uL — ABNORMAL LOW (ref 4.22–5.81)
RDW: 19.7 % — ABNORMAL HIGH (ref 11.5–15.5)
WBC: 6.6 10*3/uL (ref 4.0–10.5)
nRBC: 0.3 % — ABNORMAL HIGH (ref 0.0–0.2)

## 2022-12-14 LAB — GLUCOSE, CAPILLARY
Glucose-Capillary: 110 mg/dL — ABNORMAL HIGH (ref 70–99)
Glucose-Capillary: 147 mg/dL — ABNORMAL HIGH (ref 70–99)
Glucose-Capillary: 170 mg/dL — ABNORMAL HIGH (ref 70–99)
Glucose-Capillary: 88 mg/dL (ref 70–99)
Glucose-Capillary: 89 mg/dL (ref 70–99)
Glucose-Capillary: 97 mg/dL (ref 70–99)

## 2022-12-14 LAB — BASIC METABOLIC PANEL
Anion gap: 8 (ref 5–15)
BUN: 9 mg/dL (ref 8–23)
CO2: 26 mmol/L (ref 22–32)
Calcium: 7.4 mg/dL — ABNORMAL LOW (ref 8.9–10.3)
Chloride: 105 mmol/L (ref 98–111)
Creatinine, Ser: 0.83 mg/dL (ref 0.61–1.24)
GFR, Estimated: >60 mL/min (ref >60)
Glucose, Bld: 97 mg/dL (ref 70–99)
Potassium: 2.9 mmol/L — ABNORMAL LOW (ref 3.5–5.1)
Sodium: 139 mmol/L (ref 135–145)

## 2022-12-14 LAB — HEPARIN LEVEL (UNFRACTIONATED): Heparin Unfractionated: <0.1 [IU]/mL — ABNORMAL LOW (ref 0.30–0.70)

## 2022-12-14 LAB — MAGNESIUM: Magnesium: 1.9 mg/dL (ref 1.7–2.4)

## 2022-12-14 MED ORDER — LEVOTHYROXINE SODIUM 100 MCG PO TABS
100.0000 ug | ORAL_TABLET | Freq: Every day | ORAL | Status: DC
  Administered 2022-12-15 – 2022-12-21 (×7): 100 ug via ORAL
  Filled 2022-12-14 (×7): qty 1

## 2022-12-14 MED ORDER — CHLORHEXIDINE GLUCONATE CLOTH 2 % EX PADS
6.0000 | MEDICATED_PAD | CUTANEOUS | Status: DC
  Administered 2022-12-15 – 2022-12-19 (×6): 6 via TOPICAL

## 2022-12-14 MED ORDER — APIXABAN 5 MG PO TABS
10.0000 mg | ORAL_TABLET | Freq: Two times a day (BID) | ORAL | Status: DC
  Administered 2022-12-14 – 2022-12-17 (×7): 10 mg via ORAL
  Filled 2022-12-14 (×5): qty 2
  Filled 2022-12-14: qty 4
  Filled 2022-12-14: qty 2

## 2022-12-14 MED ORDER — ESCITALOPRAM OXALATE 10 MG PO TABS
5.0000 mg | ORAL_TABLET | Freq: Every day | ORAL | Status: DC
  Administered 2022-12-14 – 2022-12-15 (×2): 5 mg via ORAL
  Filled 2022-12-14 (×2): qty 1

## 2022-12-14 MED ORDER — APIXABAN 5 MG PO TABS: 5.0000 mg | ORAL_TABLET | Freq: Two times a day (BID) | ORAL | Status: DC

## 2022-12-14 MED ORDER — ROSUVASTATIN CALCIUM 20 MG PO TABS
20.0000 mg | ORAL_TABLET | Freq: Every evening | ORAL | Status: DC
  Administered 2022-12-14 – 2022-12-20 (×7): 20 mg via ORAL
  Filled 2022-12-14 (×7): qty 1

## 2022-12-14 MED ORDER — PANTOPRAZOLE SODIUM 40 MG PO TBEC
40.0000 mg | DELAYED_RELEASE_TABLET | Freq: Every day | ORAL | Status: DC
  Administered 2022-12-15 – 2022-12-21 (×7): 40 mg via ORAL
  Filled 2022-12-14 (×7): qty 1

## 2022-12-14 MED ORDER — ASPIRIN 81 MG PO TBEC
81.0000 mg | DELAYED_RELEASE_TABLET | Freq: Every day | ORAL | Status: DC
  Administered 2022-12-15: 81 mg via ORAL
  Filled 2022-12-14: qty 1

## 2022-12-14 MED ORDER — RANOLAZINE ER 500 MG PO TB12
500.0000 mg | ORAL_TABLET | Freq: Two times a day (BID) | ORAL | Status: DC
  Administered 2022-12-14 – 2022-12-21 (×14): 500 mg via ORAL
  Filled 2022-12-14 (×16): qty 1

## 2022-12-14 MED ORDER — POTASSIUM CHLORIDE 10 MEQ/50ML IV SOLN
10.0000 meq | INTRAVENOUS | Status: AC
  Administered 2022-12-14 (×8): 10 meq via INTRAVENOUS
  Filled 2022-12-14 (×8): qty 50

## 2022-12-14 MED ORDER — GERHARDT'S BUTT CREAM
TOPICAL_CREAM | CUTANEOUS | Status: DC | PRN
  Filled 2022-12-14 (×2): qty 1

## 2022-12-14 MED ORDER — MIDODRINE HCL 5 MG PO TABS
5.0000 mg | ORAL_TABLET | Freq: Three times a day (TID) | ORAL | Status: DC
  Administered 2022-12-14 – 2022-12-16 (×4): 5 mg via ORAL
  Filled 2022-12-14 (×5): qty 1

## 2022-12-14 MED ORDER — MAGNESIUM SULFATE 2 GM/50ML IV SOLN
2.0000 g | Freq: Once | INTRAVENOUS | Status: AC
  Administered 2022-12-14: 2 g via INTRAVENOUS
  Filled 2022-12-14: qty 50

## 2022-12-14 NOTE — Discharge Instructions (Signed)
Information on my medicine - ELIQUIS (apixaban)  This medication education was reviewed with me or my healthcare representative as part of my discharge preparation.  The pharmacist that spoke with me during my hospital stay was:  Lennon Alstrom, [facility]  Why was Eliquis prescribed for you? Eliquis was prescribed to treat blood clots that may have been found in the veins of your legs (deep vein thrombosis) or in your lungs (pulmonary embolism) and to reduce the risk of them occurring again.  What do You need to know about Eliquis ? The starting dose is 10 mg (two 5 mg tablets) taken TWICE daily for the FIRST SEVEN (7) DAYS, then on 9/23 the dose is reduced to ONE 5 mg tablet taken TWICE daily.  Eliquis may be taken with or without food.   Try to take the dose about the same time in the morning and in the evening. If you have difficulty swallowing the tablet whole please discuss with your pharmacist how to take the medication safely.  Take Eliquis exactly as prescribed and DO NOT stop taking Eliquis without talking to the doctor who prescribed the medication.  Stopping may increase your risk of developing a new blood clot.  Refill your prescription before you run out.  After discharge, you should have regular check-up appointments with your healthcare provider that is prescribing your Eliquis.    What do you do if you miss a dose? If a dose of ELIQUIS is not taken at the scheduled time, take it as soon as possible on the same day and twice-daily administration should be resumed. The dose should not be doubled to make up for a missed dose.  Important Safety Information A possible side effect of Eliquis is bleeding. You should call your healthcare provider right away if you experience any of the following: Bleeding from an injury or your nose that does not stop. Unusual colored urine (red or dark brown) or unusual colored stools (red or black). Unusual bruising for unknown reasons. A  serious fall or if you hit your head (even if there is no bleeding).  Some medicines may interact with Eliquis and might increase your risk of bleeding or clotting while on Eliquis. To help avoid this, consult your healthcare provider or pharmacist prior to using any new prescription or non-prescription medications, including herbals, vitamins, non-steroidal anti-inflammatory drugs (NSAIDs) and supplements.  This website has more information on Eliquis (apixaban): http://www.eliquis.com/eliquis/home

## 2022-12-14 NOTE — Progress Notes (Addendum)
HOSPITALIST ROUNDING NOTE Bradley Oconnor:[id number]  DOB: January 24, 1941  DOA: 12/09/2022  PCP: [name], MD  12/14/2022,8:04 AM   LOS: 4 days      Code Status: Full   From: Home  current Dispo: Unclear     82 year old male has 24-hour nurse at home-able to self feed, bedbound since May be summer 2023 Prior CABG in 1993 RCA artery PCI, [place] status post cath 2011 with medical therapy at the time, HTN, HLD, Chronic bilateral shoulder pain cervicalgia carpal tunnel syndrome, bilateral hip pain status post adductor tendinopathy injections  Developed 5 days of confusion culminating in ED visit 12/09/2022--- had been recently started on Cipro for possible UTI Brought to ED 911 hypotensive despite 3 L bicarb 10 severely hypocalcemic hypomagnesemic lactic acid 3.3 CT head negative Placed on pressors A-line EEG performed on and EEG negative--- admitted with septic shock secondary to bilateral lower lobe pneumonia and possible UTI complicated by acute pulmonary emboli on the right + filling defect in CFV and metabolic encephalopathy 9/12 echo EF 35-40% with decreased function and global hypokinesis with moderately reduced right ventricular systolic function and elevated PASP 9/15 transfused 1 unit PRBC--received albumin multiple rounds of vasopressors and was started on midodrine  Enlarged aortic aorta  Plan   Respiratory Sepsis on admission bilateral aspiration pneumonia Stop date Zosyn 9/17-blood culture no growth to date Attempt to wean midodrine in the next 24 hours from 5 mg 3 times daily to either off or twice daily Bilateral pulmonary emboli, acute, DVT Transition heparin to Eliquis twice daily-echo does not comment on heart strain and CT does not show it either Watch for bleeding as was transfused earlier this hospital stay  Cardiac Prior CABG 1993 with unstable angina in 2011 Resume GDMT Imdur when able-low blood pressure --holding  Toprol-XL amlodipine, HCTZ  resume Ranexa at a  lower dose of 500 twice daily  ASA 81, Crestor 20 Elevated troponin on 9/13 probably from PE--- no active chest pain would not workup further Paroxysmal A-fib CHADVASC >4 Not on rate control at this time  Dilated aortic arch 3 to 82-month follow-up with repeat scan  Metabolic encephalopathy on admission Not at baseline according to caregiver who is at bedside-can tell me place, year but sometimes gets confused EEG, CT head within normal limits this admission Normally quite functional Suspect post ICU delirium in addition to being off of various psychotropics-see below Hold Remeron, Seroquel 25, resume Lexapro 5 every morning,  Anemia of critical illness in the setting of heparin gtt. Obtain iron studies--watch carefully platelet count especially as transitioning to DOAC in addition to ASA Watch for bleeding Trend labs  FEN Sugars low normal, discontinue D5 and observe-supplement as able  Msk  probably some impingement at right shoulder right shoulder-cannot raise arm but has good strength bilateral upper extremities with grip as well as flexion and extension Get OT to teach ROM passive exercise--Shoulder injection as OP?  AKI on admission with hypokalemia Gap acidosis on admission/severe hypocalcemia all resolved at this time Replace potassium aggressively check magnesium a.m. as is close to normal now  Discussed with caregiver at the bedside   DVT prophylaxis: DOAC  Status is: Inpatient Remains inpatient appropriate because:    Require home health therapy and verbalization of the next 1 to 2 days but probably can discharge subsequently  Subjective: Coherent to person can tell me the president but otherwise is confused Nursing expresses concern rereappetite but he is eating some  he is still nowhere  close to baseline according to caregiver was at the bedside  Objective + exam Vitals:   12/14/22 0630 12/14/22 0645 12/14/22 0700 12/14/22 0724  BP:   105/70   Pulse: 96  93 94   Resp: 15 15 15    Temp:    97.8 F (36.6 C)  TempSrc:    Oral  SpO2: 94% 94% 95%   Weight:      Height:       Filed Weights   12/12/22 0149 12/13/22 0128 12/14/22 0108  Weight: 69.6 kg 69.6 kg 71.8 kg    Examination:   EOMI NCAT no focal deficit Chest clear no wheeze rales rhonchi S1-S2 A-fib irregularly irregular , slight tachycardia Abdomen soft Cannot flex right upper extremity can however externally rotate weekly poor internal rotation  Data Reviewed: reviewed   CBC    Component Value Date/Time   WBC 6.6 12/14/2022 0340   RBC 2.85 (L) 12/14/2022 0340   HGB 8.7 (L) 12/14/2022 0340   HGB 13.6 06/18/2017 1233   HCT 26.1 (L) 12/14/2022 0340   HCT 41.7 06/18/2017 1233   PLT 141 (L) 12/14/2022 0340   PLT 156 06/18/2017 1233   MCV 91.6 12/14/2022 0340   MCV 93 06/18/2017 1233   MCH 30.5 12/14/2022 0340   MCHC 33.3 12/14/2022 0340   RDW 19.7 (H) 12/14/2022 0340   RDW 14.2 06/18/2017 1233   LYMPHSABS 1.4 12/09/2022 2331   MONOABS 0.6 12/09/2022 2331   EOSABS 0.0 12/09/2022 2331   BASOSABS 0.0 12/09/2022 2331      Latest Ref Rng & Units 12/14/2022    3:40 AM 12/13/2022    3:31 AM 12/12/2022    1:46 AM  CMP  Glucose 70 - 99 mg/dL 97  295  621   BUN 8 - 23 mg/dL 9  7  8    Creatinine 0.61 - 1.24 mg/dL 3.08  6.57  8.46   Sodium 135 - 145 mmol/L 139  139  137   Potassium 3.5 - 5.1 mmol/L 2.9  2.9  3.2   Chloride 98 - 111 mmol/L 105  102  101   CO2 22 - 32 mmol/L 26  24  26    Calcium 8.9 - 10.3 mg/dL 7.4  7.3  7.0      Scheduled Meds:  sodium chloride   Intravenous Once   apixaban  10 mg Oral BID   Followed by   Melene Muller ON 12/21/2022] apixaban  5 mg Oral BID   aspirin EC  81 mg Oral Daily   Chlorhexidine Gluconate Cloth  6 each Topical Once per day on Monday Tuesday Wednesday Thursday Friday Saturday   feeding supplement  237 mL Oral TID BM   folic acid  1 mg Oral Daily   levothyroxine  100 mcg Oral Q0600   midodrine  5 mg Oral TID WC   oxidized  cellulose  1 each Topical Once   pantoprazole (PROTONIX) IV  40 mg Intravenous Q24H   ranolazine  500 mg Oral BID   rosuvastatin  20 mg Oral QPM   thiamine  100 mg Oral Daily   Continuous Infusions:  sodium chloride Stopped (12/11/22 0913)   dextrose 5 % and 0.9 % NaCl 40 mL/hr at 12/14/22 0700   heparin 650 Units/hr (12/14/22 0700)   piperacillin-tazobactam (ZOSYN)  IV Stopped (12/14/22 0507)   potassium chloride 10 mEq (12/14/22 0747)    Time   55  Rhetta Mura, MD  [facility]

## 2022-12-14 NOTE — Progress Notes (Signed)
[facility] ADULT ICU REPLACEMENT PROTOCOL   The patient does apply for the [facility] Adult ICU Electrolyte Replacment Protocol based on the criteria listed below:   1.Exclusion criteria: TCTS, ECMO, Dialysis, and Myasthenia Gravis patients 2. Is GFR >/= 30 ml/min? Yes.    Patient's GFR today is >60 3. Is SCr </= 2? Yes.   Patient's SCr is 0.83 mg/dL 4. Did SCr increase >/= 0.5 in 24 hours? No. 5.Pt's weight >40kg  Yes.   6. Abnormal electrolyte(s): K+ 2.9, Mag 1.9  7. Electrolytes replaced per protocol 8.  Call MD STAT for K+ </= 2.5, Phos </= 1, or Mag </= 1 Physician:  Dr. Ronnald Ramp, Lilia Argue 12/14/2022 5:06 AM

## 2022-12-14 NOTE — Progress Notes (Signed)
ANTICOAGULATION CONSULT NOTE- Follow Up  Pharmacy Consult for IV heparin Indication: pulmonary embolus and DVT  Allergies  Allergen Reactions   Procardia [Nifedipine] Other (See Comments)    Lowers bp    Phenergan [Promethazine Hcl] Nausea And Vomiting    Patient Measurements: Height: 5\' 8"  (172.7 cm) Weight: 71.8 kg (158 lb 4.6 oz) IBW/kg (Calculated) : 68.4 Heparin Dosing Weight: 70 kg  Vital Signs: Temp: 97.8 F (36.6 C) (09/16 0350) Temp Source: Oral (09/16 0400) BP: 116/76 (09/16 0500) Pulse Rate: 120 (09/16 0500)  Labs: Recent Labs    12/12/22 0146 12/12/22 1111 12/13/22 0331 12/13/22 0945 12/13/22 1840 12/14/22 0340  HGB 8.3*  --  6.7* 8.6*  --  8.7*  HCT 25.0*  --  20.3* 26.4*  --  26.1*  PLT 209  --  130* 145*  --  141*  HEPARINUNFRC 0.63   < > 0.12*  --  <0.10* <0.10*  CREATININE 0.94  --  0.84  --   --  0.83   < > = values in this interval not displayed.    Estimated Creatinine Clearance: 65.2 mL/min (by C-G formula based on SCr of 0.83 mg/dL).   Assessment: 82 y.o. year old male admitted on 12/09/2022 with R PE and DVT.  Pharmacy consulted to dose heparin.  9/15 a.m. pt bleeding from old IV site, Hgb down to 6.7, plt down to 130. MD ordered PRBC x 1 and to hold heparin gtt. Heparin restarted 9/15 pm  9/16 Hgb remains 8s post PRBC yesterday. Heparin level continues undetectable on 500 units/hr. No issues with line or bleeding reported per RN. Unsure why pt was supratherapeutic 9/12 and 9/13 on normal doses but now appears to be clearing more effectively.   Goal of Therapy:  Heparin level: 0.3-0.5 units/hr Monitor platelets by anticoagulation protocol: Yes   Plan:  Increase heparin drip rate to 650 units/hr Will f/u 8 hr heparin level  Christoper Fabian, PharmD, BCPS Please see amion for complete clinical pharmacist phone list 12/14/2022 5:11 AM

## 2022-12-15 ENCOUNTER — Inpatient Hospital Stay (HOSPITAL_COMMUNITY): Payer: PPO

## 2022-12-15 DIAGNOSIS — A419 Sepsis, unspecified organism: Secondary | ICD-10-CM | POA: Diagnosis not present

## 2022-12-15 DIAGNOSIS — I5021 Acute systolic (congestive) heart failure: Secondary | ICD-10-CM

## 2022-12-15 DIAGNOSIS — L8990 Pressure ulcer of unspecified site, unspecified stage: Secondary | ICD-10-CM | POA: Insufficient documentation

## 2022-12-15 DIAGNOSIS — I5042 Chronic combined systolic (congestive) and diastolic (congestive) heart failure: Secondary | ICD-10-CM | POA: Insufficient documentation

## 2022-12-15 DIAGNOSIS — I5041 Acute combined systolic (congestive) and diastolic (congestive) heart failure: Secondary | ICD-10-CM | POA: Insufficient documentation

## 2022-12-15 DIAGNOSIS — I4819 Other persistent atrial fibrillation: Secondary | ICD-10-CM

## 2022-12-15 DIAGNOSIS — R6521 Severe sepsis with septic shock: Secondary | ICD-10-CM | POA: Diagnosis not present

## 2022-12-15 LAB — CULTURE, BLOOD (ROUTINE X 2)
Culture: NO GROWTH
Culture: NO GROWTH
Special Requests: ADEQUATE
Special Requests: ADEQUATE

## 2022-12-15 LAB — MAGNESIUM: Magnesium: 2 mg/dL (ref 1.7–2.4)

## 2022-12-15 LAB — CBC
HCT: 31.1 % — ABNORMAL LOW (ref 39.0–52.0)
Hemoglobin: 9.9 g/dL — ABNORMAL LOW (ref 13.0–17.0)
MCH: 30.2 pg (ref 26.0–34.0)
MCHC: 31.8 g/dL (ref 30.0–36.0)
MCV: 94.8 fL (ref 80.0–100.0)
Platelets: 162 10*3/uL (ref 150–400)
RBC: 3.28 MIL/uL — ABNORMAL LOW (ref 4.22–5.81)
RDW: 19.5 % — ABNORMAL HIGH (ref 11.5–15.5)
WBC: 9.1 10*3/uL (ref 4.0–10.5)
nRBC: 0.2 % (ref 0.0–0.2)

## 2022-12-15 LAB — COMPREHENSIVE METABOLIC PANEL
ALT: 14 U/L (ref 0–44)
AST: 14 U/L — ABNORMAL LOW (ref 15–41)
Albumin: 2.5 g/dL — ABNORMAL LOW (ref 3.5–5.0)
Alkaline Phosphatase: 45 U/L (ref 38–126)
Anion gap: 8 (ref 5–15)
BUN: 8 mg/dL (ref 8–23)
CO2: 24 mmol/L (ref 22–32)
Calcium: 7.6 mg/dL — ABNORMAL LOW (ref 8.9–10.3)
Chloride: 105 mmol/L (ref 98–111)
Creatinine, Ser: 0.92 mg/dL (ref 0.61–1.24)
GFR, Estimated: >60 mL/min (ref >60)
Glucose, Bld: 137 mg/dL — ABNORMAL HIGH (ref 70–99)
Potassium: 3.8 mmol/L (ref 3.5–5.1)
Sodium: 137 mmol/L (ref 135–145)
Total Bilirubin: 1.3 mg/dL — ABNORMAL HIGH (ref 0.3–1.2)
Total Protein: 4.7 g/dL — ABNORMAL LOW (ref 6.5–8.1)

## 2022-12-15 LAB — C-REACTIVE PROTEIN: CRP: 0.6 mg/dL (ref <1.0)

## 2022-12-15 LAB — PROCALCITONIN: Procalcitonin: <0.1 ng/mL

## 2022-12-15 LAB — BRAIN NATRIURETIC PEPTIDE: B Natriuretic Peptide: 1649 pg/mL — ABNORMAL HIGH (ref 0.0–100.0)

## 2022-12-15 LAB — TSH: TSH: 1.376 u[IU]/mL (ref 0.350–4.500)

## 2022-12-15 MED ORDER — AMIODARONE HCL 200 MG PO TABS
400.0000 mg | ORAL_TABLET | Freq: Two times a day (BID) | ORAL | Status: DC
  Administered 2022-12-15 – 2022-12-20 (×10): 400 mg via ORAL
  Filled 2022-12-15 (×10): qty 2

## 2022-12-15 MED ORDER — SODIUM CHLORIDE 0.9% FLUSH: 10.0000 mL | INTRAVENOUS | Status: DC | PRN

## 2022-12-15 MED ORDER — FUROSEMIDE 10 MG/ML IJ SOLN
40.0000 mg | Freq: Once | INTRAMUSCULAR | Status: AC
  Administered 2022-12-15: 40 mg via INTRAVENOUS
  Filled 2022-12-15: qty 4

## 2022-12-15 MED ORDER — SODIUM CHLORIDE 0.9% FLUSH
10.0000 mL | Freq: Two times a day (BID) | INTRAVENOUS | Status: DC
  Administered 2022-12-15 – 2022-12-16 (×2): 10 mL
  Administered 2022-12-16: 30 mL
  Administered 2022-12-17 – 2022-12-21 (×9): 10 mL

## 2022-12-15 NOTE — Progress Notes (Signed)
PROGRESS NOTE                                                                                                                                                                                                             Patient Demographics:    Bradley Oconnor, is a 82 y.o. male, DOB - 1940/06/06, QMV:[id number]  Outpatient Primary MD for the patient is [name], MD    LOS - 5  Admit date - 12/09/2022    Chief Complaint  Patient presents with   Altered Mental Status       Brief Narrative (HPI from H&P)  82 year old gentleman with history of L-spine stenosis, anemia of chronic disease, CAD, GERD, dyslipidemia who is not very active at home and has poor functional status presented on 12/09/2022 with confusion and weakness of 3 to 5-day duration, he was diagnosed with sepsis due to pneumonia, PE and DVT, admitted to ICU.  He was stabilized and transferred to my care on 12/15/2022.  9/12 admitted, CVL and A-line placed, EEG performed    Subjective:    Bradley Oconnor today has, No headache, No chest pain, No abdominal pain - No Nausea, No new weakness tingling or numbness, no SOB   Assessment  & Plan :   Septic Shock - Bilateral Lower Lobe Pneumonia, POA - Possible UTI, POA  -  has finished zosyn for 7 day course, sepsis pathophysiology has resolved, off of pressors, blood pressure improving on midodrine.  Continue to monitor with supportive care.   Acute Metabolic Encephalopathy and delirium - mental status waxes/wanes, likely hospital-acquired delirium and encephalopathy, CT head and EEG nonacute.  No headache or focal deficits, continue to monitor with supportive care.  Minimize narcotics and benzodiazepines.   Acute Pulmonary Emboli - Acute Right Lower Extremity DVT, common femoral vein  - on Eliquis now.  Hemodynamically stable.  Acute Kidney Injury - due to sepsis resolved after supportive care.   Paroxysmal Atrial  Fibrillation, Coronary Artery Disease - no acute issues Eliquis and statin for secondary prevention.  Beta-blocker once blood pressure improves.   Anemia of Critical Illness - Bleeding from IV sites -  stable now, on Eliquis.   Moderate protein calorie malnutrition - dietitian following on oral diet.  Chronic systolic heart failure EF 35% on echocardiogram with global hypokinesis.  Currently symptom-free and compensated,  underlying history of CAD.  Currently stable on Eliquis and statin, low-dose beta-blocker when blood pressure allows, will request cardiology to take a look and opine.     L. Internal jugular C line       Condition - Extremely Guarded  Family Communication  :     Code Status :  Full  Consults  :  PCCM, Cards  PUD Prophylaxis :  PPI   Procedures  :     EEG - no acute seizures.  CTA  1. Multiple pulmonary emboli on the right. No evidence of right heart strain. 2. Filling defect in the common femoral vein on the right, compatible with DVT. 3. Strandy atelectasis or infiltrate at the lung bases. 4. Small bilateral pleural effusions. 5. Cardiomegaly with multi-vessel coronary artery calcifications. 6. Gallbladder hydrops with multiple layering stones in the gallbladder. 7. Mild gallbladder wall thickening, possible infectious or inflammatory cystitis. 8. Diverticulosis without diverticulitis. 9. 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 Recommend follow-up ultrasound every 2 years  Lower extremity venous duplex.  Right lower extremity acute DVT.   Abdominal ultrasound.  Gallstones, fatty liver.    TTE -  1. Left ventricular ejection fraction, by estimation, is 35 to 40%. The  left ventricle has moderately decreased function. The left ventricle  demonstrates global hypokinesis. Left ventricular diastolic parameters are  indeterminate.   2. Right ventricular systolic function is moderately  reduced. The right  ventricular size is moderately enlarged. There is moderately elevated  pulmonary artery systolic pressure. The estimated right ventricular  systolic pressure is 51.0 mmHg.   3. Left atrial size was severely dilated.   4. Right atrial size was mildly dilated.   5. The mitral valve is normal in structure. Moderate to severe mitral  valve regurgitation. No evidence of mitral stenosis.   6. The aortic valve is tricuspid. There is mild calcification of the  aortic valve. Aortic valve regurgitation is mild to moderate. No aortic  stenosis is present. Aortic regurgitation PHT measures 469 msec.   7. Aortic dilatation noted. There is mild dilatation of the ascending  aorta, measuring 40 mm.   8. The inferior vena cava is dilated in size with <50% respiratory  variability, suggesting right atrial pressure of 15 mmHg.       Disposition Plan  :    Status is: Inpatient   DVT Prophylaxis  :     apixaban (ELIQUIS) tablet 10 mg  apixaban (ELIQUIS) tablet 5 mg     Lab Results  Component Value Date   PLT 162 12/15/2022    Diet :  Diet Order             DIET DYS 3 Room service appropriate? Yes with Assist; Fluid consistency: Thin  Diet effective now                    Inpatient Medications  Scheduled Meds:  apixaban  10 mg Oral BID   Followed by   Melene Muller ON 12/21/2022] apixaban  5 mg Oral BID   Chlorhexidine Gluconate Cloth  6 each Topical Once per day on Monday Tuesday Wednesday Thursday Friday Saturday   escitalopram  5 mg Oral Daily   feeding supplement  237 mL Oral TID BM   folic acid  1 mg Oral Daily   levothyroxine  100 mcg Oral Q0600   midodrine  5 mg Oral Q8H   pantoprazole  40 mg Oral Daily  ranolazine  500 mg Oral BID   rosuvastatin  20 mg Oral QPM   thiamine  100 mg Oral Daily   Continuous Infusions:  sodium chloride Stopped (12/11/22 0913)   PRN Meds:.acetaminophen, docusate sodium, Gerhardt's butt cream, ondansetron (ZOFRAN) IV, mouth  rinse, polyethylene glycol     Objective:   Vitals:   12/15/22 0000 12/15/22 0400 12/15/22 0500 12/15/22 0800  BP: 107/76 119/78  115/80  Pulse: 99 96 99 (!) 110  Resp: 15 12 13 17   Temp: 98.2 F (36.8 C)  98 F (36.7 C)   TempSrc: Oral  Oral   SpO2: 97% 91% 97%   Weight:      Height:        Wt Readings from Last 3 Encounters:  12/14/22 71.8 kg  08/11/21 81.5 kg  08/07/21 83 kg     Intake/Output Summary (Last 24 hours) at 12/15/2022 1013 Last data filed at 12/15/2022 0550 Gross per 24 hour  Intake 888.52 ml  Output 500 ml  Net 388.52 ml     Physical Exam  Awake Alert, No new F.N deficits, L. Internal jugular C line [place].AT,PERRAL Supple Neck, No JVD,   Symmetrical Chest wall movement, Good air movement bilaterally, CTAB RRR,No Gallops,Rubs or new Murmurs,  +ve B.Sounds, Abd Soft, No tenderness,   No Cyanosis, Clubbing or edema     RN pressure injury documentation: Pressure Injury 12/10/22 Pretibial Proximal;Right Stage 2 -  Partial thickness loss of dermis presenting as a shallow open injury with a red, pink wound bed without slough. (Active)  12/10/22 1610  Location: Pretibial  Location Orientation: Proximal;Right  Staging: Stage 2 -  Partial thickness loss of dermis presenting as a shallow open injury with a red, pink wound bed without slough.  Wound Description (Comments):   Present on Admission: Yes  Dressing Type Foam - Lift dressing to assess site every shift 12/14/22 2003      Data Review:    Recent Labs  Lab 12/09/22 2331 12/10/22 0159 12/12/22 0146 12/13/22 0331 12/13/22 0945 12/14/22 0340 12/15/22 0341  WBC 8.9   < > 7.6 4.7 6.1 6.6 9.1  HGB 10.7*   < > 8.3* 6.7* 8.6* 8.7* 9.9*  HCT 34.7*   < > 25.0* 20.3* 26.4* 26.1* 31.1*  PLT 327   < > 209 130* 145* 141* 162  MCV 99.4   < > 95.1 96.7 93.3 91.6 94.8  MCH 30.7   < > 31.6 31.9 30.4 30.5 30.2  MCHC 30.8   < > 33.2 33.0 32.6 33.3 31.8  RDW 16.8*   < > 16.8* 16.9* 19.4* 19.7* 19.5*   LYMPHSABS 1.4  --   --   --   --   --   --   MONOABS 0.6  --   --   --   --   --   --   EOSABS 0.0  --   --   --   --   --   --   BASOSABS 0.0  --   --   --   --   --   --    < > = values in this interval not displayed.    Recent Labs  Lab 12/09/22 2331 12/09/22 2339 12/10/22 0200 12/10/22 0300 12/10/22 0307 12/10/22 0518 12/10/22 0519 12/10/22 0829 12/10/22 1300 12/11/22 0020 12/11/22 0220 12/11/22 0917 12/12/22 0146 12/13/22 0331 12/14/22 0340 12/15/22 0341  NA 139   < >  --  140  --   --   --  141 138  --  137 138 137 139 139 137  K 3.9   < >  --  3.0*  --   --   --  3.6 4.0  --  2.9* 3.7 3.2* 2.9* 2.9* 3.8  CL 108   < >  --  113*  --   --   --  110 108  --  105  --  101 102 105 105  CO2 10*  --   --  10*  --   --   --  13* 13*  --  21*  --  26 24 26 24   ANIONGAP 21*  --   --  17*  --   --   --  18* 17*  --  11  --  10 13 8 8   GLUCOSE 58*   < >  --  82  --   --   --  159* 232*  --  168*  --  121* 130* 97 137*  BUN 21   < >  --  19  --   --   --  14 13  --  12  --  8 7* 9 8  CREATININE 1.43*   < >  --  1.29*  --   --   --  1.16 1.17  --  1.15  --  0.94 0.84 0.83 0.92  AST 28  --   --   --   --   --   --  24  --   --   --   --   --   --   --  14*  ALT 19  --   --   --   --   --   --  16  --   --   --   --   --   --   --  14  ALKPHOS 64  --   --   --   --   --   --  61  --   --   --   --   --   --   --  45  BILITOT 1.3*  --   --   --   --   --   --  1.3*  --   --   --   --   --   --   --  1.3*  ALBUMIN 2.0*  --   --   --   --   --   --  1.8*  --   --   --   --   --   --   --  2.5*  PROCALCITON  --   --   --   --   --   --  <0.10  --   --  <0.10  --   --   --   --   --   --   LATICACIDVEN  --    < > 2.8*  --  2.6* 1.2 1.5 1.8  --   --   --   --   --   --   --   --   INR  --   --   --  1.6*  --   --   --   --   --   --   --   --   --   --   --   --   TSH  --   --   --   --   --   --  0.184*  --   --   --   --   --   --   --   --   --   AMMONIA  --   --   --   --   --   --   10  --   --  <10  --   --   --   --   --   --   BNP  --   --   --   --   --   --  532.8*  --   --   --   --   --   --   --   --   --   MG <0.5*  --   --  1.1*  --   --   --  2.6* 2.0  --   --   --  1.5* 1.9 1.9 2.0  CALCIUM 6.0*  --   --  5.5*  --   --   --  6.0* 6.7*  --  6.1*  --  7.0* 7.3* 7.4* 7.6*   < > = values in this interval not displayed.      Recent Labs  Lab 12/10/22 0200 12/10/22 0300 12/10/22 0307 12/10/22 0518 12/10/22 0519 12/10/22 0829 12/10/22 1300 12/11/22 0020 12/11/22 0220 12/12/22 0146 12/13/22 0331 12/14/22 0340 12/15/22 0341  PROCALCITON  --   --   --   --  <0.10  --   --  <0.10  --   --   --   --   --   LATICACIDVEN 2.8*  --  2.6* 1.2 1.5 1.8  --   --   --   --   --   --   --   INR  --  1.6*  --   --   --   --   --   --   --   --   --   --   --   TSH  --   --   --   --  0.184*  --   --   --   --   --   --   --   --   AMMONIA  --   --   --   --  10  --   --  <10  --   --   --   --   --   BNP  --   --   --   --  532.8*  --   --   --   --   --   --   --   --   MG  --  1.1*  --   --   --  2.6* 2.0  --   --  1.5* 1.9 1.9 2.0  CALCIUM  --  5.5*  --   --   --  6.0* 6.7*  --  6.1* 7.0* 7.3* 7.4* 7.6*    --------------------------------------------------------------------------------------------------------------- Lab Results  Component Value Date   CHOL 132 03/03/2021   HDL 42.50 03/03/2021   LDLCALC 65 03/03/2021   TRIG 124.0 03/03/2021   CHOLHDL 3 03/03/2021      Radiology Reports DG Chest Port 1 View  Result Date: 12/15/2022 CLINICAL DATA:  Shortness of breath. EXAM: PORTABLE CHEST 1 VIEW COMPARISON:  12/10/2018 FINDINGS: Mild cardiac enlargement with evidence of prior cardiac surgery. Low bilateral lung volumes with bibasilar atelectasis and small to moderate bilateral pleural effusions. No overt  pulmonary edema. No pneumothorax. IMPRESSION: Low lung volumes with bibasilar atelectasis and small to moderate bilateral pleural effusions. Electronically  Signed   By: Irish Lack M.D.   On: 12/15/2022 08:03      Signature  -   Susa Raring M.D on 12/15/2022 at 10:13 AM   -  To page go to [email or web address]

## 2022-12-15 NOTE — TOC Progression Note (Signed)
Transition of Care [facility]) - Progression Note    Patient Details  Name: Bradley Oconnor MRN: [id number] Date of Birth: [birth year]  [facility] CM/SW Contact  Gordy Clement, RN Phone Number: 12/15/2022, 12:29 PM  Clinical Narrative:     CM met Patient bedside who was accompanied by his private duty Aide from home.  Patient was sleeping and did not wake to CM calling his name. Neva Seat,  from [employer]" stated that Patient has 24 hour care.  Monday -Friday from 8AM-4 PM ; Monday- Thursday from 4PM-8PM and then Mon-Thursday 8 PM -8 AM. Patient's Brother comes from his home in [place] on the weekends   Aide also stated that Patient was receiving HH PT from [facility]. Patient has all DME in the home although Aide stated he is bed bound and [facility] had been working with him on Tuesdays and Thursdays to be able to sit on bed and stand.   TOC will continue to follow patient for any additional discharge needs                 Expected Discharge Plan and Services                                               Social Determinants of Health (SDOH) Interventions SDOH Screenings   Food Insecurity: No Food Insecurity (07/16/2022)  Housing: Low Risk  (07/16/2022)  Transportation Needs: No Transportation Needs (07/16/2022)  Utilities: Not At Risk (07/16/2022)  Alcohol Screen: Low Risk  (07/16/2022)  Depression (PHQ2-9): Low Risk  (07/16/2022)  Financial Resource Strain: Low Risk  (07/16/2022)  Physical Activity: Insufficiently Active (07/16/2022)  Social Connections: Moderately Isolated (07/16/2022)  Stress: No Stress Concern Present (07/16/2022)  Tobacco Use: Low Risk  (12/09/2022)    Readmission Risk Interventions     No data to display

## 2022-12-15 NOTE — Care Management Important Message (Signed)
Important Message  Patient Details  Name: Bradley Oconnor MRN: [id number] Date of Birth: Nov 19, 1940   Medicare Important Message Given:  Yes     Dorena Bodo 12/15/2022, 3:05 PM

## 2022-12-15 NOTE — Care Management Important Message (Signed)
Important Message  Patient Details  Name: LENNELL FLAUGHER MRN: [id number] Date of Birth: [birth year]   Medicare Important Message Given:  Yes     Dorena Bodo 12/15/2022, 3:06 PM

## 2022-12-15 NOTE — Evaluation (Signed)
Occupational Therapy Evaluation Patient Details Name: Bradley Oconnor MRN: [id number] DOB: 12-06-1940 Today's Date: 12/15/2022   History of Present Illness 82 y.o. male admitted 12/09/22 with AMS for 3-5 days. Pt with sepsis due to UTI, bil PNA, PE, RLE DVT with hypotension requiring vasopressors and encephalopathy. PMhx: CAD s/p CABG, HLD, HTN, GERD, hypothyroidism   Clinical Impression   Bradley Oconnor was evaluated s/p the above admission list. He is bedbound at baseline and has 24/7 care, he typically is able to roll indep but needs assist to sit and stand; he is dependent for LB ADLs and can assist minimally with UB ADLs. Upon evaluation the pt was limited by confusion, agitation, weakness, pain, unsteady sitting balance, weakness and poor activity tolerance. Overall he needed max A +2 to roll and up to max A for sitting balance and would only tolerate sitting for a few minutes due to pain. Due to the deficits listed below the pt also needs total A for LB ADLs and max A for UB ADls. Pt will benefit from continued acute OT services and HHOT, pt and pt's family/care givers preference to d/c home.         If plan is discharge home, recommend the following: A lot of help with walking and/or transfers;Two people to help with walking and/or transfers;A lot of help with bathing/dressing/bathroom;Two people to help with bathing/dressing/bathroom;Direct supervision/assist for medications management;Direct supervision/assist for financial management;Assist for transportation;Help with stairs or ramp for entrance;Assistance with cooking/housework;Supervision due to cognitive status    Functional Status Assessment  Patient has had a recent decline in their functional status and demonstrates the ability to make significant improvements in function in a reasonable and predictable amount of time.  Equipment Recommendations  None recommended by OT       Precautions / Restrictions Precautions Precautions:  Fall Restrictions Weight Bearing Restrictions: No      Mobility Bed Mobility Overal bed mobility: Needs Assistance Bed Mobility: Rolling, Supine to Sit, Sit to Supine Rolling: Max assist, +2 for safety/equipment   Supine to sit: Max assist, HOB elevated, +2 for physical assistance Sit to supine: Max assist, +2 for physical assistance   General bed mobility comments: max assist to roll bil for pericare and pad change as pt incontinent of bM, pt agitated with pericare and grabbing therapist with +2 for safety. Pivot supine to sit toward left with max +2 assist, pt able to assist with initial leg movement then no further assist and agitated once in sitting poking at therapist. Max +2 to return to supine. No dizziness or BP drop with sitting EOB grossly 5 min. max +2 to perform lateral scoot in sitting toward Bradley Oconnor with pad    Transfers Overall transfer level: Needs assistance                 General transfer comment: pt unwilling to attempt      Balance Overall balance assessment: Needs assistance   Sitting balance-Leahy Scale: Poor Sitting balance - Comments: pt with max assist for sitting balance due to posterior bias EOB                                   ADL either performed or assessed with clinical judgement   ADL Overall ADL's : Needs assistance/impaired Eating/Feeding: Maximal assistance   Grooming: Modified independent   Upper Body Bathing: Maximal assistance   Lower Body Bathing: Total assistance;+2 for safety/equipment;+2 for physical  assistance   Upper Body Dressing : Maximal assistance   Lower Body Dressing: Total assistance;+2 for physical assistance;+2 for safety/equipment   Toilet Transfer: Total assistance;+2 for physical assistance;+2 for safety/equipment   Toileting- Clothing Manipulation and Hygiene: Total assistance;+2 for physical assistance;+2 for safety/equipment       Functional mobility during ADLs: Maximal assistance;+2 for  physical assistance;+2 for safety/equipment General ADL Comments: pt refusing most participation, holding eyes closed, yelling abotu pain, confused     Vision Baseline Vision/History: 1 Wears glasses Vision Assessment?: Vision impaired- to be further tested in functional context Additional Comments: pt holding eyes closed much of the session, states he will open his eys "next week." When eyes are open he has good tracking and visual attention     Perception Perception: Not tested       Praxis Praxis: Not tested       Pertinent Vitals/Pain Pain Assessment Pain Assessment: Faces Faces Pain Scale: Hurts even more Pain Location: pt with varied reports of location (RUE, left toe, rt thigh) Pain Descriptors / Indicators: Aching, Sore Pain Intervention(s): Monitored during session, Limited activity within patient's tolerance     Extremity/Trunk Assessment Upper Extremity Assessment Upper Extremity Assessment: RUE deficits/detail;LUE deficits/detail RUE Deficits / Details: Pt refusing movement of the RUE due to shoulder pain LUE Deficits / Details: Declined assessment, but functional movement of LUE at bed level is [facility]   Lower Extremity Assessment Lower Extremity Assessment: Defer to PT evaluation   Cervical / Trunk Assessment Cervical / Trunk Assessment: Kyphotic   Communication Communication Communication: No apparent difficulties Cueing Techniques: Verbal cues;Tactile cues   Cognition Arousal: Lethargic Behavior During Therapy: Agitated Overall Cognitive Status: Impaired/Different from baseline Area of Impairment: Orientation, Attention, Following commands, Safety/judgement, Problem solving, Memory                 Orientation Level: Disoriented to, Place, Time, Situation Current Attention Level: Sustained Memory: Decreased short-term memory Following Commands: Follows one step commands with increased time, Follows one step commands inconsistently Safety/Judgement:  Decreased awareness of safety, Decreased awareness of deficits   Problem Solving: Slow processing, Requires verbal cues, Requires tactile cues, Decreased initiation General Comments: pt needing frequent cues to maintain attention, stating he is at home, agitated with movement without awareness of deficits. Per caregiver pt normally agreeable, polite and participative     General Comments  VSS, no drop in BP once sitting EOB            Home Living Family/patient expects to be discharged to:: Private residence Living Arrangements: Alone Available Help at Discharge: Family;Personal care attendant;Available 24 hours/day Type of Home: House Home Access: Ramped entrance     Home Layout: One level               Home Equipment: Wheelchair - manual;Wheelchair - power;Other (comment);Hospital bed;BSC/3in1 (hoyer)   Additional Comments: PCG 24/7 during the week, brother cares for pt on weekends      Prior Functioning/Environment Prior Level of Function : Needs assist             Mobility Comments: pt typically able to roll indep, needs assist to come to sitting and is +2 for standing attempts (with PT and PCA) ADLs Comments: ADLs completed at bed level, pt typically able to assist with UB ADLs, rolling and and feedin himself        OT Problem List: Decreased strength;Decreased range of motion;Impaired balance (sitting and/or standing);Decreased activity tolerance;Decreased cognition;Decreased safety awareness;Decreased knowledge of use of  DME or AE;Decreased knowledge of precautions;Pain      OT Treatment/Interventions: Self-care/ADL training;Therapeutic exercise;DME and/or AE instruction;Cognitive remediation/compensation;Therapeutic activities;Visual/perceptual remediation/compensation;Patient/family education;Balance training    OT Goals(Current goals can be found in the care plan section) Acute Rehab OT Goals Patient Stated Goal: to be left alone OT Goal Formulation:  With patient Time For Goal Achievement: 12/29/22 Potential to Achieve Goals: Good ADL Goals Pt Will Perform Eating: with modified independence;sitting Pt Will Perform Grooming: with modified independence;sitting;bed level Pt Will Perform Upper Body Dressing: with min assist;bed level Additional ADL Goal #1: pt will roll L&R independently as a precursor to reduce cargiver burden at home  OT Frequency: Min 1X/week    Co-evaluation PT/OT/SLP Co-Evaluation/Treatment: Yes Reason for Co-Treatment: Complexity of the patient's impairments (multi-system involvement);For patient/therapist safety;To address functional/ADL transfers PT goals addressed during session: Mobility/safety with mobility;Balance OT goals addressed during session: ADL's and self-care      AM-PAC OT "6 Clicks" Daily Activity     Outcome Measure Help from another person eating meals?: A Lot Help from another person taking care of personal grooming?: A Lot Help from another person toileting, which includes using toliet, bedpan, or urinal?: Total Help from another person bathing (including washing, rinsing, drying)?: Total Help from another person to put on and taking off regular upper body clothing?: A Lot Help from another person to put on and taking off regular lower body clothing?: Total 6 Click Score: 9   End of Session Nurse Communication: Mobility status  Activity Tolerance: Patient tolerated treatment well Patient left: in bed;with call bell/phone within reach;with bed alarm set;with family/visitor present  OT Visit Diagnosis: Unsteadiness on feet (R26.81);Other abnormalities of gait and mobility (R26.89);Muscle weakness (generalized) (M62.81);Pain;Adult, failure to thrive (R62.7)                Time: 1610-9604 OT Time Calculation (min): 29 min Charges:  OT General Charges $OT Visit: 1 Visit OT Evaluation $OT Eval Moderate Complexity: 1 Mod  Derenda Mis, OTR/L Acute Rehabilitation Services Office  [phone] Secure Chat Communication Preferred   Donia Pounds 12/15/2022, 12:25 PM

## 2022-12-15 NOTE — Plan of Care (Signed)

## 2022-12-15 NOTE — Consult Note (Signed)
Cardiology Consultation   Patient ID: Bradley Oconnor MRN: [id number]; DOB: 03-Aug-1940  Admit date: 12/09/2022 Date of Consult: 12/15/2022  PCP:  [name], MD   [facility] Providers Cardiologist:  Nicki Guadalajara, MD   {   Patient Profile:   Bradley Oconnor is a 82 y.o. male with a hx of CAD status post CABG, hyperlipidemia, hypertension, GERD, anemia who is being seen 12/15/2022 for the evaluation of new onset HFrEF at the request of Dr. Thedore Mins.  History of Present Illness:   Bradley Oconnor is currently followed by Dr. Rennis Golden.  He has history of coronary artery disease since 1992 and has had previous inferior wall MI and had PTCA with totally occluded RCA.  Because he had progressive CAD he eventually underwent CABG with a LIMA to his LAD, vein graft sequentially to a diagonal and marginal vein graft to his PDA branch of his right carotid artery.  He has had previous stent placement to his RCA in 2002.  Then again in June 2011 he suffered NSTEMI felt due to RCA graft occlusion with good left-to-right collaterals.  He had patent grafts.  He has been maximized on medical therapy and done well over the years and particularly better with the addition of Ranexa.  Overall appears to have had stable angina.  He was last seen by Dr. Tresa Endo in April 2023 and without any anginal complaints.  Currently patient is being evaluated for septic shock secondary to bilateral pneumonia, possible UTI, acute PE, acute right lower extremity DVT, new onset atrial fibrillation.  He was initially seen by critical care on 12/09/2022 with issues of confusion for the past 3 to 5 days. Eventually patient was admitted to the ICU and started on vasopressors and antibiotics.  He was then transferred out of the ICU on 12/15/2022.  Patient was noted to be metabolic acidosis with severe hypocalcemia, severe hypomagnesemia, lactic acidosis.  Appears that he has been improving and however still remains to be on  midodrine 5 mg every 8.  Cardiology has been asked to evaluate patient due to new onset HFrEF with EF 35 to 40%.  Patient evaluated at the bedside with home health nurse and neighbor.  Patient does report losing his wife about 4 months ago and appears to be clinically depressed since about that time and has not been taking care of himself.  He has had poor appetite and is been bedbound for several months now.  Patient states that he believes that he might of had a heart attack about 8 months ago however did not seek medical attention. He stated that he had shortness of breath, diaphoresis, radiating pain that felt exactly like his previous MIs.  He stated that he did not want to know that he had an event and thought not getting an evaluation would somehow did not make that into reality.  However, patient without any chest pain since then and without any significant complaints until his most recent admission.  Additionally, there is been documentation that he has had some acute metabolic encephalopathy with delirium with mentation that seems to wax and wane however during my evaluation he seems to be cognitively aware and able to make his own clinical decisions.  At this point in his life patient states that he would prefer conservative measures now that he's "alone".  He has no children.  His brother is his POA.  He denies any chest pain, shortness of breath, swelling, orthopnea, palpitations, dizziness.  Chest x-ray  today shows small to moderate bilateral pleural effusions.  BNP has increased from 500+ to 1600+. Calcium 7.6.  Albumin 2.5.  Minimally elevated LFTs.    Past Medical History:  Diagnosis Date   Blood transfusion without reported diagnosis    CAD (coronary artery disease)    GERD (gastroesophageal reflux disease)    Heart attack (HCC)    Hyperlipidemia    Hypertension 03/07/12   ECHO-WNL     08/12/11 Lexiscan MyoviewNo significant ischemia demonstrated Low risk scan There is a moderate  sized dense scar in the LCX territoy unchanged from the prior study.. Post- stress EF is 40%.   Thyroid disease     Past Surgical History:  Procedure Laterality Date   APPENDECTOMY     CERVICAL SPINE SURGERY     titanium plate in the back of neck   CORONARY ARTERY BYPASS GRAFT     HERNIA REPAIR     SMALL INTESTINE SURGERY     THYROID SURGERY     1/2 thyroid removed on right side    Inpatient Medications: Scheduled Meds:  amiodarone  400 mg Oral BID   apixaban  10 mg Oral BID   Followed by   Melene Muller ON 12/21/2022] apixaban  5 mg Oral BID   Chlorhexidine Gluconate Cloth  6 each Topical Once per day on Monday Tuesday Wednesday Thursday Friday Saturday   escitalopram  5 mg Oral Daily   feeding supplement  237 mL Oral TID BM   folic acid  1 mg Oral Daily   furosemide  40 mg Intravenous Once   levothyroxine  100 mcg Oral Q0600   midodrine  5 mg Oral Q8H   pantoprazole  40 mg Oral Daily   ranolazine  500 mg Oral BID   rosuvastatin  20 mg Oral QPM   sodium chloride flush  10-40 mL Intracatheter Q12H   thiamine  100 mg Oral Daily   Continuous Infusions:  sodium chloride Stopped (12/11/22 0913)   PRN Meds: acetaminophen, docusate sodium, Gerhardt's butt cream, ondansetron (ZOFRAN) IV, mouth rinse, polyethylene glycol, sodium chloride flush  Allergies:    Allergies  Allergen Reactions   Procardia [Nifedipine] Other (See Comments)    Lowers bp    Phenergan [Promethazine Hcl] Nausea And Vomiting    Social History:   Social History   Socioeconomic History   Marital status: Married    Spouse name: Not on file   Number of children: Not on file   Years of education: 16   Highest education level: Some college, no degree  Occupational History   Occupation: Retired  Tobacco Use   Smoking status: Never   Smokeless tobacco: Never  Vaping Use   Vaping status: Never Used  Substance and Sexual Activity   Alcohol use: No    Alcohol/week: 0.0 standard drinks of alcohol   Drug  use: No   Sexual activity: Never  Other Topics Concern   Not on file  Social History Narrative   Married.    Social Determinants of Health   Financial Resource Strain: Low Risk  (07/16/2022)   Overall Financial Resource Strain (CARDIA)    Difficulty of Paying Living Expenses: Not hard at all  Food Insecurity: No Food Insecurity (12/15/2022)   Hunger Vital Sign    Worried About Running Out of Food in the Last Year: Never true    Ran Out of Food in the Last Year: Never true  Transportation Needs: No Transportation Needs (12/15/2022)   PRAPARE - Transportation  Lack of Transportation (Medical): No    Lack of Transportation (Non-Medical): No  Physical Activity: Insufficiently Active (07/16/2022)   Exercise Vital Sign    Days of Exercise per Week: 3 days    Minutes of Exercise per Session: 30 min  Stress: No Stress Concern Present (07/16/2022)   [employer] of Occupational Health - Occupational Stress Questionnaire    Feeling of Stress : Not at all  Social Connections: Moderately Isolated (07/16/2022)   Social Connection and Isolation Panel [NHANES]    Frequency of Communication with Friends and Family: Three times a week    Frequency of Social Gatherings with Friends and Family: Three times a week    Attends Religious Services: Never    Active Member of Clubs or Organizations: No    Attends Banker Meetings: Never    Marital Status: Married  Catering manager Violence: Not At Risk (12/15/2022)   Humiliation, Afraid, Rape, and Kick questionnaire    Fear of Current or Ex-Partner: No    Emotionally Abused: No    Physically Abused: No    Sexually Abused: No    Family History:   Family History  Problem Relation Age of Onset   Heart disease Mother    Cancer Mother        breast, stomach   Heart disease Father    Hyperlipidemia Father    Heart disease Brother    Diabetes Brother    Hyperlipidemia Brother    Heart disease Paternal Grandfather    Healthy Brother       ROS:  Please see the history of present illness.  All other ROS reviewed and negative.     Physical Exam/Data:   Vitals:   12/15/22 0500 12/15/22 0800 12/15/22 1107 12/15/22 1200  BP:  115/80 122/87 96/71  Pulse: 99 (!) 110 99 94  Resp: 13 17 13 14   Temp: 98 F (36.7 C) 98.2 F (36.8 C) 98.3 F (36.8 C)   TempSrc: Oral Oral Oral   SpO2: 97% 93% 99% 98%  Weight:      Height:        Intake/Output Summary (Last 24 hours) at 12/15/2022 1536 Last data filed at 12/15/2022 1400 Gross per 24 hour  Intake 800.06 ml  Output 300 ml  Net 500.06 ml      12/14/2022    1:08 AM 12/13/2022    1:28 AM 12/12/2022    1:49 AM  Last 3 Weights  Weight (lbs) 158 lb 4.6 oz 153 lb 7 oz 153 lb 7 oz  Weight (kg) 71.8 kg 69.6 kg 69.6 kg     Body mass index is 24.07 kg/m.  General:  Well nourished, well developed, in no acute distress HEENT: normal Neck: no JVD Vascular: No carotid bruits; Distal pulses 2+ bilaterally Cardiac: Irregularly irregular Lungs: Diminished breath sounds in the bases Abd: soft, nontender, no hepatomegaly  Ext: no edema.  Cool to the touch  Musculoskeletal:  1-2+ Skin: pale cool  Neuro:  CNs 2-12 intact, no focal abnormalities noted Psych:  Normal affect   EKG:  The EKG was personally reviewed and demonstrates: Atrial fibrillation heart rate 126.  No acute ST-T wave changes. Telemetry:  Telemetry was personally reviewed and demonstrates: No fibrillation heart rate 90-110.  Relevant CV Studies: Echocardiogram 12/10/2022 1. Left ventricular ejection fraction, by estimation, is 35 to 40%. The  left ventricle has moderately decreased function. The left ventricle  demonstrates global hypokinesis. Left ventricular diastolic parameters are  indeterminate.  2. Right ventricular systolic function is moderately reduced. The right  ventricular size is moderately enlarged. There is moderately elevated  pulmonary artery systolic pressure. The estimated right ventricular   systolic pressure is 51.0 mmHg.   3. Left atrial size was severely dilated.   4. Right atrial size was mildly dilated.   5. The mitral valve is normal in structure. Moderate to severe mitral  valve regurgitation. No evidence of mitral stenosis.   6. The aortic valve is tricuspid. There is mild calcification of the  aortic valve. Aortic valve regurgitation is mild to moderate. No aortic  stenosis is present. Aortic regurgitation PHT measures 469 msec.   7. Aortic dilatation noted. There is mild dilatation of the ascending  aorta, measuring 40 mm.   8. The inferior vena cava is dilated in size with <50% respiratory  variability, suggesting right atrial pressure of 15 mmHg.   Laboratory Data:  High Sensitivity Troponin:   Recent Labs  Lab 12/09/22 2331 12/10/22 0300 12/11/22 0220  TROPONINIHS 67* 62* 117*     Chemistry Recent Labs  Lab 12/13/22 0331 12/14/22 0340 12/15/22 0341  NA 139 139 137  K 2.9* 2.9* 3.8  CL 102 105 105  CO2 24 26 24   GLUCOSE 130* 97 137*  BUN 7* 9 8  CREATININE 0.84 0.83 0.92  CALCIUM 7.3* 7.4* 7.6*  MG 1.9 1.9 2.0  GFRNONAA >60 >60 >60  ANIONGAP 13 8 8     Recent Labs  Lab 12/09/22 2331 12/10/22 0829 12/15/22 0341  PROT 5.6* 4.6* 4.7*  ALBUMIN 2.0* 1.8* 2.5*  AST 28 24 14*  ALT 19 16 14   ALKPHOS 64 61 45  BILITOT 1.3* 1.3* 1.3*   Lipids No results for input(s): "CHOL", "TRIG", "HDL", "LABVLDL", "LDLCALC", "CHOLHDL" in the last 168 hours.  Hematology Recent Labs  Lab 12/13/22 0945 12/14/22 0340 12/15/22 0341  WBC 6.1 6.6 9.1  RBC 2.83* 2.85* 3.28*  HGB 8.6* 8.7* 9.9*  HCT 26.4* 26.1* 31.1*  MCV 93.3 91.6 94.8  MCH 30.4 30.5 30.2  MCHC 32.6 33.3 31.8  RDW 19.4* 19.7* 19.5*  PLT 145* 141* 162   Thyroid  Recent Labs  Lab 12/10/22 0829 12/15/22 0329  TSH  --  1.376  FREET4 1.43*  --     BNP Recent Labs  Lab 12/10/22 0519 12/15/22 0329  BNP 532.8* 1,649.0*    DDimer No results for input(s): "DDIMER" in the last 168  hours.   Radiology/Studies:  DG Chest Port 1 View  Result Date: 12/15/2022 CLINICAL DATA:  Shortness of breath. EXAM: PORTABLE CHEST 1 VIEW COMPARISON:  12/10/2018 FINDINGS: Mild cardiac enlargement with evidence of prior cardiac surgery. Low bilateral lung volumes with bibasilar atelectasis and small to moderate bilateral pleural effusions. No overt pulmonary edema. No pneumothorax. IMPRESSION: Low lung volumes with bibasilar atelectasis and small to moderate bilateral pleural effusions. Electronically Signed   By: Irish Lack M.D.   On: 12/15/2022 08:03     Assessment and Plan:   New onset HFrEF Currently admitted for sepsis secondary to pneumonia, acute DVT/PE, possible UTI.  Previously normal EF in 2018.  Echo this admission shows EF 35 to 40% with global hypokinesis.  Moderately reduced RV function.  Moderately enlarged RV.  Moderately elevated PASP.  RVSP 51.  Severely dilated LA. likely in low output state and still requiring midodrine 5 mg 3 times daily.  Given his extensive history of CAD, there is suspicion that this might be related to ischemic cardiomyopathy, but  echo with no WMA and mildly elevated trops in the 100s.  Also could be stress cardiomyopathy or related to his Afib. Patient at this time would prefer conservative measures and would not pursue cardiac catheterization.  He is up 15 L and approximately 5 pounds up with signs of some volume peripherally. Blood pressures are soft, titrate GDMT as able to do so when more stable.  Will give 1 dose of IV Lasix 40 mg help with afterload reduction Not likely a good SGLT2 and candidate due to UTI in bedridden status.  New onset persistent atrial fibrillation Asx and decently rate controlled with heart rates less than 110.  Likely contributing to his cardiomyopathy. Will start PO amiodarone (400 mg twice daily for 1 week starting 12/15/2022, 200 mg twice daily for 2 weeks, 200 mg daily thereafter). Continue Eliquis.  Deferring  cardioversion now as he is unlikely to sustain this so we will load on amiodarone and revisit outpatient. Will plan for 3 weeks of DOAC with cardioversion if still indicated. TSH 0.184, T4 1.43; management per primary team  CAD status post CABG Has had multiple ischemic events dating back to 36.  Overall seems to have stable disease however there is some concern that he might have experienced out-of-hospital MI in did not seek treatment.  Again patient deferring catheterization at this time.  No anginal complaints since then. Continue Ranexa 500 mg twice daily, rosuvastatin 20 mg.  On Eliquis.  Stop aspirin at discharge. PTA Imdur 120 mg, Toprol-XL 25 or 100 mg?, amlodipine 5 mg.  Defer continuing until more stable.   Prolonged QTc QTc 501.  In the setting of atrial fibrillation less likely to be accurate.  However continue to avoid QTc prolonging agents if possible. Will repeat EKG.   Moderate to severe MR/mild to moderate MR MR was previously mild to moderate in 2018 echocardiogram.  Continue to follow.  Acute PE/DVT on Eliquis Malnutrition Thyroid dysfunction     Risk Assessment/Risk Scores:   New York Heart Association (NYHA) Functional Class NYHA Class II  CHA2DS2-VASc Score = 4  This indicates a 4.8% annual risk of stroke. The patient's score is based upon: CHF History: 0 HTN History: 1 Diabetes History: 0 Stroke History: 0 Vascular Disease History: 1 Age Score: 2 Gender Score: 0   For questions or updates, please contact [facility] Please consult [email or web address] for contact info under    Signed, Abagail Kitchens, PA-C  12/15/2022 3:36 PM

## 2022-12-15 NOTE — Progress Notes (Signed)
Physical Therapy Treatment Patient Details Name: Bradley Oconnor MRN: [id number] DOB: 08-22-1940 Today's Date: 12/15/2022   History of Present Illness 82 y.o. male admitted 12/09/22 with AMS for 3-5 days. Pt with sepsis due to UTI, bil PNA, PE, RLE DVT with hypotension requiring vasopressors and encephalopathy. PMhx: CAD s/p CABG, HLD, HTN, GERD, hypothyroidism    PT Comments  Pt seen in conjunction with OT to progress mobility. Caregiver present stating pt has not walked since June 2024 but is working with HHPT 2x/wk to get EOB and begin standing trials with +2 assist. Per caregiver pt is normally agreeable, able to roll in bed without assist, state when he needs to have BM and eager to move. Pt with confusion stating he is at home, unable to follow commands to don glasses, agitated with movement reaching out to grab and poke therapists. Pt currently limited by AMS as well as weakness stating entire body is sore and very limited tolerance willingness to participate. Pt does intend to return home with caregivers and brother's assist on weekend. Will continue to follow acutely to return to baseline functional level.   Supine 107/80 (89) Sitting 122/87 (98) HR 107 SPO2 95% RA   If plan is discharge home, recommend the following: Supervision due to cognitive status;Direct supervision/assist for medications management;Assistance with feeding;Assist for transportation;Assistance with cooking/housework;A lot of help with bathing/dressing/bathroom;Two people to help with walking and/or transfers;Direct supervision/assist for financial management   Can travel by private vehicle        Equipment Recommendations  None recommended by PT    Recommendations for Other Services       Precautions / Restrictions Precautions Precautions: Fall Restrictions Weight Bearing Restrictions: No     Mobility  Bed Mobility Overal bed mobility: Needs Assistance Bed Mobility: Rolling, Supine to Sit, Sit to  Supine Rolling: Max assist, +2 for safety/equipment   Supine to sit: Max assist, HOB elevated, +2 for physical assistance Sit to supine: Max assist, +2 for physical assistance   General bed mobility comments: max assist to roll bil for pericare and pad change as pt incontinent of bM, pt agitated with pericare and grabbing therapist with +2 for safety. Pivot supine to sit toward left with max +2 assist, pt able to assist with initial leg movement then no further assist and agitated once in sitting poking at therapist. Max +2 to return to supine. No dizziness or BP drop with sitting EOB grossly 5 min. max +2 to perform lateral scoot in sitting toward [facility] with pad    Transfers                   General transfer comment: pt unwilling to attempt    Ambulation/Gait                   Stairs             Wheelchair Mobility     Tilt Bed    Modified Rankin (Stroke Patients Only)       Balance Overall balance assessment: Needs assistance   Sitting balance-Leahy Scale: Poor Sitting balance - Comments: pt with max assist for sitting balance due to posterior bias EOB                                    Cognition Arousal: Lethargic Behavior During Therapy: Agitated Overall Cognitive Status: Impaired/Different from baseline Area of Impairment: Orientation, Attention,  Following commands, Safety/judgement, Problem solving, Memory                 Orientation Level: Disoriented to, Place, Time, Situation Current Attention Level: Sustained Memory: Decreased short-term memory Following Commands: Follows one step commands with increased time, Follows one step commands inconsistently Safety/Judgement: Decreased awareness of safety, Decreased awareness of deficits   Problem Solving: Slow processing, Requires verbal cues, Requires tactile cues, Decreased initiation General Comments: pt needing frequent cues to maintain attention, stating he is at  home, agitated with movement without awareness of deficits. Per caregiver pt normally agreeable, polite and participative        Exercises      General Comments        Pertinent Vitals/Pain Pain Assessment Pain Assessment: Faces Pain Score: 4  Pain Location: pt with varied reports of location (RUE, left toe, rt thigh) Pain Descriptors / Indicators: Aching, Sore Pain Intervention(s): Limited activity within patient's tolerance, Repositioned, Monitored during session    Home Living Family/patient expects to be discharged to:: Private residence Living Arrangements: Alone Available Help at Discharge: Family;Personal care attendant;Available 24 hours/day Type of Home: House Home Access: Ramped entrance       Home Layout: One level Home Equipment: Wheelchair - manual;Wheelchair - power;Other (comment);Hospital bed;BSC/3in1 (hoyer) Additional Comments: PCG 24/7 during the week, brother cares for pt on weekends    Prior Function            PT Goals (current goals can now be found in the care plan section) Progress towards PT goals: Not progressing toward goals - comment    Frequency    Min 1X/week      PT Plan      Co-evaluation PT/OT/SLP Co-Evaluation/Treatment: Yes Reason for Co-Treatment: Complexity of the patient's impairments (multi-system involvement);For patient/therapist safety;To address functional/ADL transfers PT goals addressed during session: Mobility/safety with mobility;Balance OT goals addressed during session: ADL's and self-care      AM-PAC PT "6 Clicks" Mobility   Outcome Measure  Help needed turning from your back to your side while in a flat bed without using bedrails?: Total Help needed moving from lying on your back to sitting on the side of a flat bed without using bedrails?: Total Help needed moving to and from a bed to a chair (including a wheelchair)?: Total Help needed standing up from a chair using your arms (e.g., wheelchair or  bedside chair)?: Total Help needed to walk in hospital room?: Total Help needed climbing 3-5 steps with a railing? : Total 6 Click Score: 6    End of Session   Activity Tolerance: Treatment limited secondary to agitation Patient left: in bed;with call bell/phone within reach;with family/visitor present;with bed alarm set Nurse Communication: Mobility status;Need for lift equipment PT Visit Diagnosis: Muscle weakness (generalized) (M62.81);Other symptoms and signs involving the nervous system (R29.898);Other abnormalities of gait and mobility (R26.89)     Time: 0865-7846 PT Time Calculation (min) (ACUTE ONLY): 29 min  Charges:    $Therapeutic Activity: 8-22 mins PT General Charges $$ ACUTE PT VISIT: 1 Visit                     Merryl Hacker, PT Acute Rehabilitation Services Office: [phone]    Raymon Schlarb B Panhia Karl 12/15/2022, 11:15 AM

## 2022-12-16 DIAGNOSIS — R6521 Severe sepsis with septic shock: Secondary | ICD-10-CM | POA: Diagnosis not present

## 2022-12-16 DIAGNOSIS — I4819 Other persistent atrial fibrillation: Secondary | ICD-10-CM | POA: Diagnosis not present

## 2022-12-16 DIAGNOSIS — A419 Sepsis, unspecified organism: Secondary | ICD-10-CM | POA: Diagnosis not present

## 2022-12-16 DIAGNOSIS — I5021 Acute systolic (congestive) heart failure: Secondary | ICD-10-CM | POA: Diagnosis not present

## 2022-12-16 MED ORDER — FUROSEMIDE 10 MG/ML IJ SOLN
40.0000 mg | Freq: Two times a day (BID) | INTRAMUSCULAR | Status: DC
  Administered 2022-12-16 – 2022-12-18 (×5): 40 mg via INTRAVENOUS
  Filled 2022-12-16 (×6): qty 4

## 2022-12-16 MED ORDER — POTASSIUM CHLORIDE CRYS ER 20 MEQ PO TBCR
40.0000 meq | EXTENDED_RELEASE_TABLET | Freq: Four times a day (QID) | ORAL | Status: AC
  Administered 2022-12-16 (×2): 40 meq via ORAL
  Filled 2022-12-16 (×2): qty 2

## 2022-12-16 MED ORDER — MAGNESIUM SULFATE 4 GM/100ML IV SOLN
4.0000 g | Freq: Once | INTRAVENOUS | Status: AC
  Administered 2022-12-16: 4 g via INTRAVENOUS
  Filled 2022-12-16: qty 100

## 2022-12-16 NOTE — Progress Notes (Signed)
DAILY PROGRESS NOTE   Patient Name: Bradley Oconnor Date of Encounter: 12/16/2022 Cardiologist: Nicki Guadalajara, MD  Chief Complaint   No issues overnight  Patient Profile   Bradley Oconnor is a 82 y.o. male with a hx of CAD status post CABG, hyperlipidemia, hypertension, GERD, anemia who is being seen 12/15/2022 for the evaluation of new onset HFrEF at the request of Dr. Thedore Mins.   Subjective   Clearly has some degree of dementia - telling me how much walking and therapy has helped him, but his aide at the beside says he has not walked in 4 months. Afib rates improved somwhat overnight on amiodarone. Qtc was around 500 msec. Will check another 12 lead EKG tomorrow to re-evaluate. Received IV lasix 40 mg x 1 - net negative about 500 cc - creatinine stable with this. BNP remains elevated. Potassium low at 3.3. today.  Objective   Vitals:   12/16/22 0000 12/16/22 0400 12/16/22 0800 12/16/22 0844  BP: 108/74 102/77 (!) 118/93 (!) 135/94  Pulse: (!) 111 (!) 108 (!) 101 (!) 106  Resp: 16 18 18 15   Temp:  98.2 F (36.8 C)  97.8 F (36.6 C)  TempSrc:  Oral  Oral  SpO2: 94% 96%  93%  Weight:      Height:        Intake/Output Summary (Last 24 hours) at 12/16/2022 0919 Last data filed at 12/16/2022 0800 Gross per 24 hour  Intake 486.59 ml  Output 2000 ml  Net -1513.41 ml   Filed Weights   12/12/22 0149 12/13/22 0128 12/14/22 0108  Weight: 69.6 kg 69.6 kg 71.8 kg    Physical Exam   General appearance: alert, no distress, and pale Neck: no carotid bruit, no JVD, and thyroid not enlarged, symmetric, no tenderness/mass/nodules Lungs: clear to auscultation bilaterally Heart: irregularly irregular rhythm Abdomen: soft, non-tender; bowel sounds normal; no masses,  no organomegaly Extremities: edema 1+ LE Pulses: 2+ and symmetric Skin: pale, cool. dry Neurologic: Mental status: awake, but confused Psych: pleasant  Inpatient Medications    Scheduled Meds:  amiodarone  400 mg  Oral BID   apixaban  10 mg Oral BID   Followed by   Melene Muller ON 12/21/2022] apixaban  5 mg Oral BID   Chlorhexidine Gluconate Cloth  6 each Topical Once per day on Monday Tuesday Wednesday Thursday Friday Saturday   feeding supplement  237 mL Oral TID BM   folic acid  1 mg Oral Daily   levothyroxine  100 mcg Oral Q0600   midodrine  5 mg Oral Q8H   pantoprazole  40 mg Oral Daily   potassium chloride  40 mEq Oral Q6H   ranolazine  500 mg Oral BID   rosuvastatin  20 mg Oral QPM   sodium chloride flush  10-40 mL Intracatheter Q12H   thiamine  100 mg Oral Daily    Continuous Infusions:  sodium chloride Stopped (12/11/22 0913)    PRN Meds: acetaminophen, docusate sodium, Gerhardt's butt cream, ondansetron (ZOFRAN) IV, mouth rinse, polyethylene glycol, sodium chloride flush   Labs   Results for orders placed or performed during the hospital encounter of 12/09/22 (from the past 48 hour(s))  Glucose, capillary     Status: Abnormal   Collection Time: 12/14/22 11:28 AM  Result Value Ref Range   Glucose-Capillary 147 (H) 70 - 99 mg/dL    Comment: Glucose reference range applies only to samples taken after fasting for at least 8 hours.  Glucose, capillary  Status: Abnormal   Collection Time: 12/14/22  3:03 PM  Result Value Ref Range   Glucose-Capillary 170 (H) 70 - 99 mg/dL    Comment: Glucose reference range applies only to samples taken after fasting for at least 8 hours.  Brain natriuretic peptide     Status: Abnormal   Collection Time: 12/15/22  3:29 AM  Result Value Ref Range   B Natriuretic Peptide 1,649.0 (H) 0.0 - 100.0 pg/mL    Comment: Performed at [facility], [address], [place], [place] [address]  TSH     Status: None   Collection Time: 12/15/22  3:29 AM  Result Value Ref Range   TSH 1.376 0.350 - 4.500 uIU/mL    Comment: Performed by a 3rd Generation assay with a functional sensitivity of <=0.01 uIU/mL. Performed at [facility], [address]  [place], [place] [address]   Procalcitonin     Status: None   Collection Time: 12/15/22  3:29 AM  Result Value Ref Range   Procalcitonin <0.10 ng/mL    Comment:        Interpretation: PCT (Procalcitonin) <= 0.5 ng/mL: Systemic infection (sepsis) is not likely. Local bacterial infection is possible. (NOTE)       Sepsis PCT Algorithm           Lower Respiratory Tract                                      Infection PCT Algorithm    ----------------------------     ----------------------------         PCT < 0.25 ng/mL                PCT < 0.10 ng/mL          Strongly encourage             Strongly discourage   discontinuation of antibiotics    initiation of antibiotics    ----------------------------     -----------------------------       PCT 0.25 - 0.50 ng/mL            PCT 0.10 - 0.25 ng/mL               OR       >80% decrease in PCT            Discourage initiation of                                            antibiotics      Encourage discontinuation           of antibiotics    ----------------------------     -----------------------------         PCT >= 0.50 ng/mL              PCT 0.26 - 0.50 ng/mL               AND        <80% decrease in PCT             Encourage initiation of  antibiotics       Encourage continuation           of antibiotics    ----------------------------     -----------------------------        PCT >= 0.50 ng/mL                  PCT > 0.50 ng/mL               AND         increase in PCT                  Strongly encourage                                      initiation of antibiotics    Strongly encourage escalation           of antibiotics                                     -----------------------------                                           PCT <= 0.25 ng/mL                                                 OR                                        > 80% decrease in PCT                                       Discontinue / Do not initiate                                             antibiotics  Performed at [facility], [address], [place], [place] [address]   CBC     Status: Abnormal   Collection Time: 12/15/22  3:41 AM  Result Value Ref Range   WBC 9.1 4.0 - 10.5 K/uL   RBC 3.28 (L) 4.22 - 5.81 MIL/uL   Hemoglobin 9.9 (L) 13.0 - 17.0 g/dL   HCT 60.4 (L) 54.0 - 98.1 %   MCV 94.8 80.0 - 100.0 fL   MCH 30.2 26.0 - 34.0 pg   MCHC 31.8 30.0 - 36.0 g/dL   RDW 19.1 (H) 47.8 - 29.5 %   Platelets 162 150 - 400 K/uL   nRBC 0.2 0.0 - 0.2 %    Comment: Performed at [facility], [address], [place], [place] [address]  Magnesium     Status: None   Collection Time: 12/15/22  3:41 AM  Result Value Ref Range   Magnesium 2.0 1.7 - 2.4 mg/dL    Comment: Performed  at [facility], [address], [place], [place] [address]  Comprehensive metabolic panel     Status: Abnormal   Collection Time: 12/15/22  3:41 AM  Result Value Ref Range   Sodium 137 135 - 145 mmol/L   Potassium 3.8 3.5 - 5.1 mmol/L   Chloride 105 98 - 111 mmol/L   CO2 24 22 - 32 mmol/L   Glucose, Bld 137 (H) 70 - 99 mg/dL    Comment: Glucose reference range applies only to samples taken after fasting for at least 8 hours.   BUN 8 8 - 23 mg/dL   Creatinine, Ser 9.56 0.61 - 1.24 mg/dL   Calcium 7.6 (L) 8.9 - 10.3 mg/dL   Total Protein 4.7 (L) 6.5 - 8.1 g/dL   Albumin 2.5 (L) 3.5 - 5.0 g/dL   AST 14 (L) 15 - 41 U/L   ALT 14 0 - 44 U/L   Alkaline Phosphatase 45 38 - 126 U/L   Total Bilirubin 1.3 (H) 0.3 - 1.2 mg/dL   GFR, Estimated >38 >75 mL/min    Comment: (NOTE) Calculated using the CKD-EPI Creatinine Equation (2021)    Anion gap 8 5 - 15    Comment: Performed at [facility], [address] [place], [place] [address]  C-reactive protein     Status: None   Collection Time: 12/15/22  6:30 AM  Result Value Ref Range   CRP 0.6 <1.0 mg/dL    Comment: Performed at [facility], [address] [place], [place] [address]  Magnesium     Status: Abnormal   Collection Time: 12/16/22  4:01 AM  Result Value Ref Range   Magnesium 1.6 (L) 1.7 - 2.4 mg/dL    Comment: Performed at [facility], [address] [place], [place] [address]  C-reactive protein     Status: None   Collection Time: 12/16/22  4:01 AM  Result Value Ref Range   CRP 0.9 <1.0 mg/dL    Comment: Performed at [facility], [address], [place], [place] [address]  Procalcitonin     Status: None   Collection Time: 12/16/22  4:01 AM  Result Value Ref Range   Procalcitonin <0.10 ng/mL    Comment:        Interpretation: PCT (Procalcitonin) <= 0.5 ng/mL: Systemic infection (sepsis) is not likely. Local bacterial infection is possible. (NOTE)       Sepsis PCT Algorithm           Lower Respiratory Tract                                      Infection PCT Algorithm    ----------------------------     ----------------------------         PCT < 0.25 ng/mL                PCT < 0.10 ng/mL          Strongly encourage             Strongly discourage   discontinuation of antibiotics    initiation of antibiotics    ----------------------------     -----------------------------       PCT 0.25 - 0.50 ng/mL            PCT 0.10 - 0.25 ng/mL  OR       >80% decrease in PCT            Discourage initiation of                                            antibiotics      Encourage discontinuation           of antibiotics    ----------------------------     -----------------------------         PCT >= 0.50 ng/mL              PCT 0.26 - 0.50 ng/mL               AND        <80% decrease in PCT             Encourage initiation of                                             antibiotics       Encourage continuation           of antibiotics    ----------------------------     -----------------------------        PCT >= 0.50 ng/mL                  PCT > 0.50 ng/mL               AND         increase in  PCT                  Strongly encourage                                      initiation of antibiotics    Strongly encourage escalation           of antibiotics                                     -----------------------------                                           PCT <= 0.25 ng/mL                                                 OR                                        > 80% decrease in PCT                                      Discontinue / Do not initiate  antibiotics  Performed at [facility], [address], [place], [place] [address]   CBC with Differential/Platelet     Status: Abnormal   Collection Time: 12/16/22  4:01 AM  Result Value Ref Range   WBC 10.2 4.0 - 10.5 K/uL   RBC 3.17 (L) 4.22 - 5.81 MIL/uL   Hemoglobin 9.6 (L) 13.0 - 17.0 g/dL   HCT 70.6 (L) 23.7 - 62.8 %   MCV 95.3 80.0 - 100.0 fL   MCH 30.3 26.0 - 34.0 pg   MCHC 31.8 30.0 - 36.0 g/dL   RDW 31.5 (H) 17.6 - 16.0 %   Platelets 180 150 - 400 K/uL   nRBC 0.2 0.0 - 0.2 %   Neutrophils Relative % 75 %   Neutro Abs 7.7 1.7 - 7.7 K/uL   Lymphocytes Relative 17 %   Lymphs Abs 1.7 0.7 - 4.0 K/uL   Monocytes Relative 5 %   Monocytes Absolute 0.5 0.1 - 1.0 K/uL   Eosinophils Relative 2 %   Eosinophils Absolute 0.2 0.0 - 0.5 K/uL   Basophils Relative 0 %   Basophils Absolute 0.0 0.0 - 0.1 K/uL   Immature Granulocytes 1 %   Abs Immature Granulocytes 0.11 (H) 0.00 - 0.07 K/uL    Comment: Performed at [facility], [address], [place], [place] [address]  Brain natriuretic peptide     Status: Abnormal   Collection Time: 12/16/22  4:01 AM  Result Value Ref Range   B Natriuretic Peptide 1,571.1 (H) 0.0 - 100.0 pg/mL    Comment: Performed at [facility], [address], [place], [place] [address]  Basic metabolic panel     Status: Abnormal   Collection Time: 12/16/22  4:01 AM  Result Value Ref Range   Sodium 139 135 - 145 mmol/L   Potassium 3.3  (L) 3.5 - 5.1 mmol/L   Chloride 103 98 - 111 mmol/L   CO2 28 22 - 32 mmol/L   Glucose, Bld 113 (H) 70 - 99 mg/dL    Comment: Glucose reference range applies only to samples taken after fasting for at least 8 hours.   BUN 9 8 - 23 mg/dL   Creatinine, Ser 8.54 0.61 - 1.24 mg/dL   Calcium 7.9 (L) 8.9 - 10.3 mg/dL   GFR, Estimated >62 >70 mL/min    Comment: (NOTE) Calculated using the CKD-EPI Creatinine Equation (2021)    Anion gap 8 5 - 15    Comment: Performed at [facility], [address], [place], [place] [address]    ECG   Afib with QTc ~500 msec - Personally Reviewed  Telemetry   Afib with PVC's - Personally Reviewed  Radiology    DG Chest Port 1 View  Result Date: 12/15/2022 CLINICAL DATA:  Shortness of breath. EXAM: PORTABLE CHEST 1 VIEW COMPARISON:  12/10/2018 FINDINGS: Mild cardiac enlargement with evidence of prior cardiac surgery. Low bilateral lung volumes with bibasilar atelectasis and small to moderate bilateral pleural effusions. No overt pulmonary edema. No pneumothorax. IMPRESSION: Low lung volumes with bibasilar atelectasis and small to moderate bilateral pleural effusions. Electronically Signed   By: Irish Lack M.D.   On: 12/15/2022 08:03    Cardiac Studies   N/A  Assessment   Principal Problem:   Septic shock (HCC) Active Problems:   Persistent atrial fibrillation (HCC)   Acute systolic (congestive) heart failure (HCC)   Pressure injury of skin   Plan   Only about net  500 cc (although 1400 cc urine output) - will resume lasix 40 mg IV BID - replete potassium and magnesium. BNP improved to 1571 (was 1649) - rates somewhat better on oral amiodarone, but still loading. BP low normal.  Time Spent Directly with Patient:  I have spent a total of 25 minutes with the patient reviewing hospital notes, telemetry, EKGs, labs and examining the patient as well as establishing an assessment and plan that was discussed personally with the patient.  >  50% of time was spent in direct patient care.  Length of Stay:  LOS: 6 days   Chrystie Nose, MD, [facility], FACP  [place]  [facility]  Medical Director of the Advanced Lipid Disorders &  Cardiovascular Risk Reduction Clinic Diplomate of the American Board of Clinical Lipidology Attending Cardiologist  Direct Dial: [phone]  Fax: [phone]  Website:  [email or web address] 12/16/2022, 9:19 AM

## 2022-12-16 NOTE — Progress Notes (Signed)
PROGRESS NOTE                                                                                                                                                                                                             Patient Demographics:    Bradley Oconnor, is a 82 y.o. male, DOB - 02-01-1941, UVO:[id number]  Outpatient Primary MD for the patient is [name], MD    LOS - 6  Admit date - 12/09/2022    Chief Complaint  Patient presents with   Altered Mental Status       Brief Narrative (HPI from H&P)  82 year old gentleman with history of L-spine stenosis, anemia of chronic disease, CAD, GERD, dyslipidemia who is not very active at home and has poor functional status presented on 12/09/2022 with confusion and weakness of 3 to 5-day duration, he was diagnosed with sepsis due to pneumonia, PE and DVT, admitted to ICU.  He was stabilized and transferred to my care on 12/15/2022.  9/12 admitted, CVL and A-line placed, EEG performed    Subjective:    [name] today has, No headache, No chest pain, No abdominal pain - No Nausea, No new weakness tingling or numbness, no SOB   Assessment  & Plan :   Septic Shock - Bilateral Lower Lobe Pneumonia, POA - Possible UTI, POA  -  has finished zosyn for 7 day course, sepsis pathophysiology has resolved, off of pressors, blood pressure improving on midodrine.  Continue to monitor with supportive care.   Acute Metabolic Encephalopathy and delirium - mental status waxes/wanes, likely hospital-acquired delirium and encephalopathy, CT head and EEG nonacute.  No headache or focal deficits, continue to monitor with supportive care.  Minimize narcotics and benzodiazepines.   Acute Pulmonary Emboli - Acute Right Lower Extremity DVT, common femoral vein  - on Eliquis now.  Hemodynamically stable.  Acute Kidney Injury - due to sepsis resolved after supportive care.   Paroxysmal Atrial  Fibrillation, Coronary Artery Disease - no acute issues Eliquis and statin for secondary prevention.  Beta-blocker once blood pressure improves.   Anemia of Critical Illness - Bleeding from IV sites -  stable now, on Eliquis.   Moderate protein calorie malnutrition - dietitian following on oral diet.  Acute systolic CHF  - EF 35% on echocardiogram with global hypokinesis.  - Currently symptom-free  and compensated, underlying history of CAD.  Currently stable on Eliquis and statin, low-dose beta-blocker when blood pressure allows  Persistent A-fib - he remains on oral amiodarone, on Eliquis for anticoagulation. -QTc mildly prolonged, so we will DC Lexapro, replete potassium and magnesium  Hypokalemia  hypomagnesemia -Repleted    L. Internal jugular C line       Condition - Extremely Guarded  Family Communication  :   Discussed with his home caregiver, who is currently at bedside  Code Status :  Full  Consults  :  PCCM, Cards  PUD Prophylaxis :  PPI   Procedures  :     EEG - no acute seizures.  CTA  1. Multiple pulmonary emboli on the right. No evidence of right heart strain. 2. Filling defect in the common femoral vein on the right, compatible with DVT. 3. Strandy atelectasis or infiltrate at the lung bases. 4. Small bilateral pleural effusions. 5. Cardiomegaly with multi-vessel coronary artery calcifications. 6. Gallbladder hydrops with multiple layering stones in the gallbladder. 7. Mild gallbladder wall thickening, possible infectious or inflammatory cystitis. 8. Diverticulosis without diverticulitis. 9. 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 Recommend follow-up ultrasound every 2 years  Lower extremity venous duplex.  Right lower extremity acute DVT.   Abdominal ultrasound.  Gallstones, fatty liver.    TTE -  1. Left ventricular ejection fraction, by estimation, is 35 to 40%. The  left  ventricle has moderately decreased function. The left ventricle  demonstrates global hypokinesis. Left ventricular diastolic parameters are  indeterminate.   2. Right ventricular systolic function is moderately reduced. The right  ventricular size is moderately enlarged. There is moderately elevated  pulmonary artery systolic pressure. The estimated right ventricular  systolic pressure is 51.0 mmHg.   3. Left atrial size was severely dilated.   4. Right atrial size was mildly dilated.   5. The mitral valve is normal in structure. Moderate to severe mitral  valve regurgitation. No evidence of mitral stenosis.   6. The aortic valve is tricuspid. There is mild calcification of the  aortic valve. Aortic valve regurgitation is mild to moderate. No aortic  stenosis is present. Aortic regurgitation PHT measures 469 msec.   7. Aortic dilatation noted. There is mild dilatation of the ascending  aorta, measuring 40 mm.   8. The inferior vena cava is dilated in size with <50% respiratory  variability, suggesting right atrial pressure of 15 mmHg.       Disposition Plan  :    Status is: Inpatient   DVT Prophylaxis  :     apixaban (ELIQUIS) tablet 10 mg  apixaban (ELIQUIS) tablet 5 mg     Lab Results  Component Value Date   PLT 180 12/16/2022    Diet :  Diet Order             DIET DYS 3 Room service appropriate? Yes with Assist; Fluid consistency: Thin  Diet effective now                    Inpatient Medications  Scheduled Meds:  amiodarone  400 mg Oral BID   apixaban  10 mg Oral BID   Followed by   Melene Muller ON 12/21/2022] apixaban  5 mg Oral BID   Chlorhexidine Gluconate Cloth  6 each Topical Once per day on Monday Tuesday Wednesday Thursday Friday Saturday   feeding supplement  237 mL Oral TID BM   folic acid  1 mg Oral Daily   furosemide  40 mg Intravenous BID   levothyroxine  100 mcg Oral Q0600   pantoprazole  40 mg Oral Daily   potassium chloride  40 mEq Oral Q6H    ranolazine  500 mg Oral BID   rosuvastatin  20 mg Oral QPM   sodium chloride flush  10-40 mL Intracatheter Q12H   thiamine  100 mg Oral Daily   Continuous Infusions:  sodium chloride Stopped (12/11/22 0913)   PRN Meds:.acetaminophen, docusate sodium, Gerhardt's butt cream, ondansetron (ZOFRAN) IV, mouth rinse, polyethylene glycol, sodium chloride flush     Objective:   Vitals:   12/16/22 0000 12/16/22 0400 12/16/22 0800 12/16/22 0844  BP: 108/74 102/77 (!) 118/93 (!) 135/94  Pulse: (!) 111 (!) 108 (!) 101 (!) 106  Resp: 16 18 18 15   Temp:  98.2 F (36.8 C)  97.8 F (36.6 C)  TempSrc:  Oral  Oral  SpO2: 94% 96%  93%  Weight:      Height:        Wt Readings from Last 3 Encounters:  12/14/22 71.8 kg  08/11/21 81.5 kg  08/07/21 83 kg     Intake/Output Summary (Last 24 hours) at 12/16/2022 1341 Last data filed at 12/16/2022 1000 Gross per 24 hour  Intake 816.13 ml  Output 2000 ml  Net -1183.87 ml     Physical Exam  Awake Alert, Oriented X 2, No new F.N deficits, Normal affect Symmetrical Chest wall movement, Good air movement bilaterally, CTAB Irregular,No Gallops,Rubs or new Murmurs, No Parasternal Heave +ve B.Sounds, Abd Soft, No tenderness, No rebound - guarding or rigidity. No Cyanosis, Clubbing, +1 edema, No new Rash or bruise       RN pressure injury documentation: Pressure Injury 12/10/22 Pretibial Proximal;Right Stage 2 -  Partial thickness loss of dermis presenting as a shallow open injury with a red, pink wound bed without slough. (Active)  12/10/22 6433  Location: Pretibial  Location Orientation: Proximal;Right  Staging: Stage 2 -  Partial thickness loss of dermis presenting as a shallow open injury with a red, pink wound bed without slough.  Wound Description (Comments):   Present on Admission: Yes  Dressing Type Foam - Lift dressing to assess site every shift 12/15/22 2020      Data Review:    Recent Labs  Lab 12/09/22 2331 12/10/22 0159  12/13/22 0331 12/13/22 0945 12/14/22 0340 12/15/22 0341 12/16/22 0401  WBC 8.9   < > 4.7 6.1 6.6 9.1 10.2  HGB 10.7*   < > 6.7* 8.6* 8.7* 9.9* 9.6*  HCT 34.7*   < > 20.3* 26.4* 26.1* 31.1* 30.2*  PLT 327   < > 130* 145* 141* 162 180  MCV 99.4   < > 96.7 93.3 91.6 94.8 95.3  MCH 30.7   < > 31.9 30.4 30.5 30.2 30.3  MCHC 30.8   < > 33.0 32.6 33.3 31.8 31.8  RDW 16.8*   < > 16.9* 19.4* 19.7* 19.5* 18.9*  LYMPHSABS 1.4  --   --   --   --   --  1.7  MONOABS 0.6  --   --   --   --   --  0.5  EOSABS 0.0  --   --   --   --   --  0.2  BASOSABS 0.0  --   --   --   --   --  0.0   < > = values in this interval not  displayed.    Recent Labs  Lab 12/09/22 2331 12/09/22 2339 12/10/22 0200 12/10/22 0300 12/10/22 0307 12/10/22 0518 12/10/22 0519 12/10/22 0829 12/10/22 1300 12/11/22 0020 12/11/22 0220 12/12/22 0146 12/13/22 0331 12/14/22 0340 12/15/22 0329 12/15/22 0341 12/15/22 0630 12/16/22 0401  NA 139   < >  --  140  --   --   --  141   < >  --    < > 137 139 139  --  137  --  139  K 3.9   < >  --  3.0*  --   --   --  3.6   < >  --    < > 3.2* 2.9* 2.9*  --  3.8  --  3.3*  CL 108   < >  --  113*  --   --   --  110   < >  --    < > 101 102 105  --  105  --  103  CO2 10*  --   --  10*  --   --   --  13*   < >  --    < > 26 24 26   --  24  --  28  ANIONGAP 21*  --   --  17*  --   --   --  18*   < >  --    < > 10 13 8   --  8  --  8  GLUCOSE 58*   < >  --  82  --   --   --  159*   < >  --    < > 121* 130* 97  --  137*  --  113*  BUN 21   < >  --  19  --   --   --  14   < >  --    < > 8 7* 9  --  8  --  9  CREATININE 1.43*   < >  --  1.29*  --   --   --  1.16   < >  --    < > 0.94 0.84 0.83  --  0.92  --  0.90  AST 28  --   --   --   --   --   --  24  --   --   --   --   --   --   --  14*  --   --   ALT 19  --   --   --   --   --   --  16  --   --   --   --   --   --   --  14  --   --   ALKPHOS 64  --   --   --   --   --   --  61  --   --   --   --   --   --   --  45  --   --   BILITOT  1.3*  --   --   --   --   --   --  1.3*  --   --   --   --   --   --   --  1.3*  --   --   ALBUMIN 2.0*  --   --   --   --   --   --  1.8*  --   --   --   --   --   --   --  2.5*  --   --   CRP  --   --   --   --   --   --   --   --   --   --   --   --   --   --   --   --  0.6 0.9  PROCALCITON  --   --   --   --   --   --  <0.10  --   --  <0.10  --   --   --   --  <0.10  --   --  <0.10  LATICACIDVEN  --    < > 2.8*  --  2.6* 1.2 1.5 1.8  --   --   --   --   --   --   --   --   --   --   INR  --   --   --  1.6*  --   --   --   --   --   --   --   --   --   --   --   --   --   --   TSH  --   --   --   --   --   --  0.184*  --   --   --   --   --   --   --  1.376  --   --   --   AMMONIA  --   --   --   --   --   --  10  --   --  <10  --   --   --   --   --   --   --   --   BNP  --   --   --   --   --   --  532.8*  --   --   --   --   --   --   --  1,649.0*  --   --  1,571.1*  MG <0.5*  --   --  1.1*  --   --   --  2.6*   < >  --   --  1.5* 1.9 1.9  --  2.0  --  1.6*  CALCIUM 6.0*  --   --  5.5*  --   --   --  6.0*   < >  --    < > 7.0* 7.3* 7.4*  --  7.6*  --  7.9*   < > = values in this interval not displayed.      Recent Labs  Lab 12/10/22 0200 12/10/22 0300 12/10/22 0307 12/10/22 0518 12/10/22 0519 12/10/22 0829 12/10/22 1300 12/11/22 0020 12/11/22 0220 12/12/22 0146 12/13/22 0331 12/14/22 0340 12/15/22 0329 12/15/22 0341 12/15/22 0630 12/16/22 0401  CRP  --   --   --   --   --   --   --   --   --   --   --   --   --   --  0.6 0.9  PROCALCITON  --   --   --   --  <0.10  --   --  <0.10  --   --   --   --  <0.10  --   --  <  0.10  LATICACIDVEN 2.8*  --  2.6* 1.2 1.5 1.8  --   --   --   --   --   --   --   --   --   --   INR  --  1.6*  --   --   --   --   --   --   --   --   --   --   --   --   --   --   TSH  --   --   --   --  0.184*  --   --   --   --   --   --   --  1.376  --   --   --   AMMONIA  --   --   --   --  10  --   --  <10  --   --   --   --   --   --   --   --   BNP  --    --   --   --  532.8*  --   --   --   --   --   --   --  1,649.0*  --   --  1,571.1*  MG  --  1.1*  --   --   --  2.6*   < >  --   --  1.5* 1.9 1.9  --  2.0  --  1.6*  CALCIUM  --  5.5*  --   --   --  6.0*   < >  --    < > 7.0* 7.3* 7.4*  --  7.6*  --  7.9*   < > = values in this interval not displayed.    --------------------------------------------------------------------------------------------------------------- Lab Results  Component Value Date   CHOL 132 03/03/2021   HDL 42.50 03/03/2021   LDLCALC 65 03/03/2021   TRIG 124.0 03/03/2021   CHOLHDL 3 03/03/2021      Radiology Reports DG Chest Port 1 View  Result Date: 12/15/2022 CLINICAL DATA:  Shortness of breath. EXAM: PORTABLE CHEST 1 VIEW COMPARISON:  12/10/2018 FINDINGS: Mild cardiac enlargement with evidence of prior cardiac surgery. Low bilateral lung volumes with bibasilar atelectasis and small to moderate bilateral pleural effusions. No overt pulmonary edema. No pneumothorax. IMPRESSION: Low lung volumes with bibasilar atelectasis and small to moderate bilateral pleural effusions. Electronically Signed   By: Irish Lack M.D.   On: 12/15/2022 08:03      Signature  -   Mliss Fritz Delvonte Berenson M.D on 12/16/2022 at 1:41 PM   -  To page go to [email or web address]

## 2022-12-17 ENCOUNTER — Inpatient Hospital Stay (HOSPITAL_COMMUNITY): Payer: PPO

## 2022-12-17 DIAGNOSIS — I5021 Acute systolic (congestive) heart failure: Secondary | ICD-10-CM | POA: Diagnosis not present

## 2022-12-17 DIAGNOSIS — R6521 Severe sepsis with septic shock: Secondary | ICD-10-CM | POA: Diagnosis not present

## 2022-12-17 DIAGNOSIS — A419 Sepsis, unspecified organism: Secondary | ICD-10-CM | POA: Diagnosis not present

## 2022-12-17 DIAGNOSIS — I4819 Other persistent atrial fibrillation: Secondary | ICD-10-CM | POA: Diagnosis not present

## 2022-12-17 LAB — CBC WITH DIFFERENTIAL/PLATELET
Abs Immature Granulocytes: 0.07 10*3/uL (ref 0.00–0.07)
Basophils Absolute: 0 10*3/uL (ref 0.0–0.1)
Basophils Relative: 0 %
Eosinophils Absolute: 0.3 10*3/uL (ref 0.0–0.5)
Eosinophils Relative: 3 %
HCT: 30.4 % — ABNORMAL LOW (ref 39.0–52.0)
Hemoglobin: 9.9 g/dL — ABNORMAL LOW (ref 13.0–17.0)
Immature Granulocytes: 1 %
Lymphocytes Relative: 15 %
Lymphs Abs: 1.5 10*3/uL (ref 0.7–4.0)
MCH: 31.1 pg (ref 26.0–34.0)
MCHC: 32.6 g/dL (ref 30.0–36.0)
MCV: 95.6 fL (ref 80.0–100.0)
Monocytes Absolute: 0.6 10*3/uL (ref 0.1–1.0)
Monocytes Relative: 6 %
Neutro Abs: 7.2 10*3/uL (ref 1.7–7.7)
Neutrophils Relative %: 75 %
Platelets: 174 10*3/uL (ref 150–400)
RBC: 3.18 MIL/uL — ABNORMAL LOW (ref 4.22–5.81)
RDW: 18.7 % — ABNORMAL HIGH (ref 11.5–15.5)
WBC: 9.6 10*3/uL (ref 4.0–10.5)
nRBC: 0 % (ref 0.0–0.2)

## 2022-12-17 LAB — BASIC METABOLIC PANEL
Anion gap: 7 (ref 5–15)
BUN: 10 mg/dL (ref 8–23)
CO2: 28 mmol/L (ref 22–32)
Calcium: 7.7 mg/dL — ABNORMAL LOW (ref 8.9–10.3)
Chloride: 101 mmol/L (ref 98–111)
Creatinine, Ser: 0.95 mg/dL (ref 0.61–1.24)
GFR, Estimated: >60 mL/min (ref >60)
Glucose, Bld: 114 mg/dL — ABNORMAL HIGH (ref 70–99)
Potassium: 3.2 mmol/L — ABNORMAL LOW (ref 3.5–5.1)
Sodium: 136 mmol/L (ref 135–145)

## 2022-12-17 LAB — BRAIN NATRIURETIC PEPTIDE: B Natriuretic Peptide: 1309.5 pg/mL — ABNORMAL HIGH (ref 0.0–100.0)

## 2022-12-17 LAB — PROCALCITONIN: Procalcitonin: <0.1 ng/mL

## 2022-12-17 LAB — C-REACTIVE PROTEIN: CRP: 0.8 mg/dL (ref <1.0)

## 2022-12-17 LAB — MAGNESIUM: Magnesium: 1.8 mg/dL (ref 1.7–2.4)

## 2022-12-17 MED ORDER — POTASSIUM CHLORIDE CRYS ER 20 MEQ PO TBCR
40.0000 meq | EXTENDED_RELEASE_TABLET | Freq: Four times a day (QID) | ORAL | Status: DC
  Administered 2022-12-17: 40 meq via ORAL
  Filled 2022-12-17: qty 2

## 2022-12-17 MED ORDER — ENOXAPARIN SODIUM 80 MG/0.8ML IJ SOSY
70.0000 mg | PREFILLED_SYRINGE | Freq: Two times a day (BID) | INTRAMUSCULAR | Status: DC
  Administered 2022-12-17 – 2022-12-18 (×2): 70 mg via SUBCUTANEOUS
  Filled 2022-12-17 (×2): qty 0.8

## 2022-12-17 MED ORDER — POTASSIUM CHLORIDE 20 MEQ PO PACK
40.0000 meq | PACK | Freq: Four times a day (QID) | ORAL | Status: AC
  Administered 2022-12-17 (×2): 40 meq via ORAL
  Filled 2022-12-17 (×2): qty 2

## 2022-12-17 MED ORDER — MAGNESIUM SULFATE 2 GM/50ML IV SOLN
2.0000 g | Freq: Once | INTRAVENOUS | Status: AC
  Administered 2022-12-17: 2 g via INTRAVENOUS
  Filled 2022-12-17: qty 50

## 2022-12-17 NOTE — Progress Notes (Signed)
PROGRESS NOTE                                                                                                                                                                                                             Patient Demographics:    Bradley Oconnor, is a 82 y.o. male, DOB - September 28, 1940, EPP:[id number]  Outpatient Primary MD for the patient is [name], MD    LOS - 7  Admit date - 12/09/2022    Chief Complaint  Patient presents with   Altered Mental Status       Brief Narrative (HPI from H&P)    82 year old gentleman with history of L-spine stenosis, anemia of chronic disease, CAD, GERD, dyslipidemia who is not very active at home and has poor functional status presented on 12/09/2022 with confusion and weakness of 3 to 5-day duration, he was diagnosed with sepsis due to pneumonia, PE and DVT, admitted to ICU.  He was stabilized and transferred to my care on 12/15/2022.  9/12 admitted, CVL and A-line placed, EEG performed    Subjective:    Bradley Oconnor today denies any complaints, but upon asking caregiver at bedside, he appears with poor oral intake, poor appetite, pocketing food.     Assessment  & Plan :   Septic Shock POA Bilateral Lower Lobe Pneumonia, POA  UTI, POA   -  has finished zosyn for 7 day course, sepsis pathophysiology has resolved, off of pressors, blood pressure improving on midodrine.  Continue to monitor with supportive care.  Acute systolic CHF  - EF 35% on echocardiogram with global hypokinesis.  - Currently symptom-free and compensated, underlying history of CAD.  Currently stable on Eliquis and statin, low-dose beta-blocker when blood pressure allows -carioldoy input greatly appreciated, continue with current IV diuresis, BNP trending down  Persistent A-fib - he remains on oral amiodarone, on Eliquis for anticoagulation. -QTc mildly prolonged, so we will DC Lexapro, replete  potassium and magnesium   Acute Metabolic Encephalopathy and delirium  - mental status waxes/wanes, likely hospital-acquired delirium and encephalopathy,  - EEG nonacute.   -  Minimize narcotics and benzodiazepines. -CT head with no acute finding, showing stable changes including brain atrophy and old stroke -Will obtain MRI of the brain to rule out any acute ischemic events contribute to worsening mental status. -will check B12, folic  acid, TSH.  Failure to thrive -Patient with very poor appetite, oral intake, pocketing his food, SLP consult is pending, brother would like to consider him for feeding tube if appropriate, so for now we will change his Eliquis to Lovenox.   Acute Pulmonary Emboli  Acute Right Lower Extremity DVT, common femoral vein   - on Eliquis now.  Hemodynamically stable.  Acute Kidney Injury  - due to sepsis resolved after supportive care.   Paroxysmal Atrial Fibrillation, Coronary Artery Disease  - no acute issues Eliquis and statin for secondary prevention.  Beta-blocker once blood pressure improves.   Anemia of Critical Illness - Bleeding from IV sites  -  stable now, on Eliquis.   Moderate protein calorie malnutrition - dietitian following on oral diet.    Hypokalemia  hypomagnesemia -Repleted    L. Internal jugular C line       Condition - Extremely Guarded  Family Communication  :   Discussed with his home caregiver, who is currently at bedside, discussed with brother by phone  Code Status :  Full  Consults  :  PCCM, Cards  PUD Prophylaxis :  PPI   Procedures  :     EEG - no acute seizures.  CTA  1. Multiple pulmonary emboli on the right. No evidence of right heart strain. 2. Filling defect in the common femoral vein on the right, compatible with DVT. 3. Strandy atelectasis or infiltrate at the lung bases. 4. Small bilateral pleural effusions. 5. Cardiomegaly with multi-vessel coronary artery calcifications. 6. Gallbladder  hydrops with multiple layering stones in the gallbladder. 7. Mild gallbladder wall thickening, possible infectious or inflammatory cystitis. 8. Diverticulosis without diverticulitis. 9. 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 Recommend follow-up ultrasound every 2 years  Lower extremity venous duplex.  Right lower extremity acute DVT.   Abdominal ultrasound.  Gallstones, fatty liver.    TTE -  1. Left ventricular ejection fraction, by estimation, is 35 to 40%. The  left ventricle has moderately decreased function. The left ventricle  demonstrates global hypokinesis. Left ventricular diastolic parameters are  indeterminate.   2. Right ventricular systolic function is moderately reduced. The right  ventricular size is moderately enlarged. There is moderately elevated  pulmonary artery systolic pressure. The estimated right ventricular  systolic pressure is 51.0 mmHg.   3. Left atrial size was severely dilated.   4. Right atrial size was mildly dilated.   5. The mitral valve is normal in structure. Moderate to severe mitral  valve regurgitation. No evidence of mitral stenosis.   6. The aortic valve is tricuspid. There is mild calcification of the  aortic valve. Aortic valve regurgitation is mild to moderate. No aortic  stenosis is present. Aortic regurgitation PHT measures 469 msec.   7. Aortic dilatation noted. There is mild dilatation of the ascending  aorta, measuring 40 mm.   8. The inferior vena cava is dilated in size with <50% respiratory  variability, suggesting right atrial pressure of 15 mmHg.       Disposition Plan  :    Status is: Inpatient   DVT Prophylaxis  :     apixaban (ELIQUIS) tablet 10 mg  apixaban (ELIQUIS) tablet 5 mg     Lab Results  Component Value Date   PLT 174 12/17/2022    Diet :  Diet Order             DIET DYS 3 Room service appropriate? Yes with  Assist; Fluid consistency:  Thin  Diet effective now                    Inpatient Medications  Scheduled Meds:  amiodarone  400 mg Oral BID   apixaban  10 mg Oral BID   Followed by   Melene Muller ON 12/21/2022] apixaban  5 mg Oral BID   Chlorhexidine Gluconate Cloth  6 each Topical Once per day on Monday Tuesday Wednesday Thursday Friday Saturday   feeding supplement  237 mL Oral TID BM   folic acid  1 mg Oral Daily   furosemide  40 mg Intravenous BID   levothyroxine  100 mcg Oral Q0600   pantoprazole  40 mg Oral Daily   potassium chloride  40 mEq Oral Q6H   ranolazine  500 mg Oral BID   rosuvastatin  20 mg Oral QPM   sodium chloride flush  10-40 mL Intracatheter Q12H   thiamine  100 mg Oral Daily   Continuous Infusions:  sodium chloride Stopped (12/11/22 0913)   PRN Meds:.acetaminophen, docusate sodium, Gerhardt's butt cream, ondansetron (ZOFRAN) IV, mouth rinse, polyethylene glycol, sodium chloride flush     Objective:   Vitals:   12/17/22 0435 12/17/22 0500 12/17/22 0800 12/17/22 1240  BP: 111/70  107/79 104/68  Pulse: (!) 103  (!) 108 (!) 112  Resp: 19  14 16   Temp: (!) 97.5 F (36.4 C)  98 F (36.7 C) 97.6 F (36.4 C)  TempSrc: Oral  Axillary Oral  SpO2: 96%  95% 93%  Weight:  72.2 kg    Height:        Wt Readings from Last 3 Encounters:  12/17/22 72.2 kg  08/11/21 81.5 kg  08/07/21 83 kg     Intake/Output Summary (Last 24 hours) at 12/17/2022 1421 Last data filed at 12/17/2022 1220 Gross per 24 hour  Intake --  Output 2000 ml  Net -2000 ml     Physical Exam  Awake Alert, Oriented X 2, No new F.N deficits, Normal affect Symmetrical Chest wall movement, Good air movement bilaterally, CTAB Irregular,No Gallops,Rubs or new Murmurs, No Parasternal Heave +ve B.Sounds, Abd Soft, No tenderness, No rebound - guarding or rigidity. No Cyanosis, Clubbing, +1 edema, No new Rash or bruise       RN pressure injury documentation: Pressure Injury 12/10/22 Pretibial Proximal;Right Stage  2 -  Partial thickness loss of dermis presenting as a shallow open injury with a red, pink wound bed without slough. (Active)  12/10/22 1610  Location: Pretibial  Location Orientation: Proximal;Right  Staging: Stage 2 -  Partial thickness loss of dermis presenting as a shallow open injury with a red, pink wound bed without slough.  Wound Description (Comments):   Present on Admission: Yes  Dressing Type Foam - Lift dressing to assess site every shift 12/17/22 0800      Data Review:    Recent Labs  Lab 12/13/22 0945 12/14/22 0340 12/15/22 0341 12/16/22 0401 12/17/22 0323  WBC 6.1 6.6 9.1 10.2 9.6  HGB 8.6* 8.7* 9.9* 9.6* 9.9*  HCT 26.4* 26.1* 31.1* 30.2* 30.4*  PLT 145* 141* 162 180 174  MCV 93.3 91.6 94.8 95.3 95.6  MCH 30.4 30.5 30.2 30.3 31.1  MCHC 32.6 33.3 31.8 31.8 32.6  RDW 19.4* 19.7* 19.5* 18.9* 18.7*  LYMPHSABS  --   --   --  1.7 1.5  MONOABS  --   --   --  0.5 0.6  EOSABS  --   --   --  0.2 0.3  BASOSABS  --   --   --  0.0 0.0    Recent Labs  Lab 12/11/22 0020 12/11/22 0220 12/13/22 0331 12/14/22 0340 12/15/22 0329 12/15/22 0341 12/15/22 0630 12/16/22 0401 12/17/22 0323  NA  --    < > 139 139  --  137  --  139 136  K  --    < > 2.9* 2.9*  --  3.8  --  3.3* 3.2*  CL  --    < > 102 105  --  105  --  103 101  CO2  --    < > 24 26  --  24  --  28 28  ANIONGAP  --    < > 13 8  --  8  --  8 7  GLUCOSE  --    < > 130* 97  --  137*  --  113* 114*  BUN  --    < > 7* 9  --  8  --  9 10  CREATININE  --    < > 0.84 0.83  --  0.92  --  0.90 0.95  AST  --   --   --   --   --  14*  --   --   --   ALT  --   --   --   --   --  14  --   --   --   ALKPHOS  --   --   --   --   --  45  --   --   --   BILITOT  --   --   --   --   --  1.3*  --   --   --   ALBUMIN  --   --   --   --   --  2.5*  --   --   --   CRP  --   --   --   --   --   --  0.6 0.9 0.8  PROCALCITON <0.10  --   --   --  <0.10  --   --  <0.10 <0.10  TSH  --   --   --   --  1.376  --   --   --   --    AMMONIA <10  --   --   --   --   --   --   --   --   BNP  --   --   --   --  1,649.0*  --   --  1,571.1* 1,309.5*  MG  --    < > 1.9 1.9  --  2.0  --  1.6* 1.8  CALCIUM  --    < > 7.3* 7.4*  --  7.6*  --  7.9* 7.7*   < > = values in this interval not displayed.      Recent Labs  Lab 12/11/22 0020 12/11/22 0220 12/13/22 0331 12/14/22 0340 12/15/22 0329 12/15/22 0341 12/15/22 0630 12/16/22 0401 12/17/22 0323  CRP  --   --   --   --   --   --  0.6 0.9 0.8  PROCALCITON <0.10  --   --   --  <0.10  --   --  <0.10 <0.10  TSH  --   --   --   --  1.376  --   --   --   --  AMMONIA <10  --   --   --   --   --   --   --   --   BNP  --   --   --   --  1,649.0*  --   --  1,571.1* 1,309.5*  MG  --    < > 1.9 1.9  --  2.0  --  1.6* 1.8  CALCIUM  --    < > 7.3* 7.4*  --  7.6*  --  7.9* 7.7*   < > = values in this interval not displayed.    --------------------------------------------------------------------------------------------------------------- Lab Results  Component Value Date   CHOL 132 03/03/2021   HDL 42.50 03/03/2021   LDLCALC 65 03/03/2021   TRIG 124.0 03/03/2021   CHOLHDL 3 03/03/2021      Radiology Reports DG Chest Port 1 View  Result Date: 12/15/2022 CLINICAL DATA:  Shortness of breath. EXAM: PORTABLE CHEST 1 VIEW COMPARISON:  12/10/2018 FINDINGS: Mild cardiac enlargement with evidence of prior cardiac surgery. Low bilateral lung volumes with bibasilar atelectasis and small to moderate bilateral pleural effusions. No overt pulmonary edema. No pneumothorax. IMPRESSION: Low lung volumes with bibasilar atelectasis and small to moderate bilateral pleural effusions. Electronically Signed   By: Irish Lack M.D.   On: 12/15/2022 08:03      Signature  -   Mliss Fritz Jeremi Losito M.D on 12/17/2022 at 2:21 PM   -  To page go to [email or web address]

## 2022-12-17 NOTE — Progress Notes (Signed)
Rounded on pt for line care. Found excessive amount of blood under the dressing. Dressing changed and no obvious sign of bleeding seen at this time. Pt denies any trauma to site today. Primary rn notified of findings and encouraged to reach out with any further issues.

## 2022-12-17 NOTE — Plan of Care (Signed)

## 2022-12-17 NOTE — Progress Notes (Signed)
ANTICOAGULATION CONSULT NOTE- Follow Up  Pharmacy Consult for Lovenox Indication: pulmonary embolus and DVT  Allergies  Allergen Reactions   Procardia [Nifedipine] Other (See Comments)    Lowers bp    Phenergan [Promethazine Hcl] Nausea And Vomiting    Patient Measurements: Height: 5\' 8"  (172.7 cm) Weight: 72.2 kg (159 lb 2.8 oz) IBW/kg (Calculated) : 68.4 Heparin Dosing Weight: 70 kg  Vital Signs: Temp: 97.6 F (36.4 C) (09/19 1240) Temp Source: Oral (09/19 1240) BP: 104/68 (09/19 1240) Pulse Rate: 84 (09/19 1442)  Labs: Recent Labs    12/15/22 0341 12/16/22 0401 12/17/22 0323  HGB 9.9* 9.6* 9.9*  HCT 31.1* 30.2* 30.4*  PLT 162 180 174  CREATININE 0.92 0.90 0.95    Estimated Creatinine Clearance: 57 mL/min (by C-G formula based on SCr of 0.95 mg/dL).   Assessment: 82 y.o. year old male admitted on 12/09/2022 with R PE and DVT.  Pharmacy consulted to dose Lovenox while Apixaban is on hold.  PEG tube planned  Goal of Therapy:  Monitor platelets by anticoagulation protocol: Yes   Plan:  Lovenox 70 mg sq Q 12 hours starting tonight Follow up for Eliquis resumption  Thank you Okey Regal, PharmD 12/17/2022 2:57 PM

## 2022-12-17 NOTE — Progress Notes (Signed)
DAILY PROGRESS NOTE   Patient Name: Bradley Oconnor Date of Encounter: 12/17/2022 Cardiologist: Nicki Guadalajara, MD  Chief Complaint   No issues overnight  Patient Profile   Bradley Oconnor is a 82 y.o. male with a hx of CAD status post CABG, hyperlipidemia, hypertension, GERD, anemia who is being seen 12/15/2022 for the evaluation of new onset HFrEF at the request of Dr. Thedore Mins.   Subjective   Net negative about 1.1L overnight- still +12L overall. Creatinine stable - BNP further improved to 1309 today.   Objective   Vitals:   12/16/22 2120 12/17/22 0011 12/17/22 0435 12/17/22 0500  BP: 125/78 119/69 111/70   Pulse:  100 (!) 103   Resp:  15 19   Temp:  97.8 F (36.6 C) (!) 97.5 F (36.4 C)   TempSrc:  Oral Oral   SpO2:  95% 96%   Weight:    72.2 kg  Height:        Intake/Output Summary (Last 24 hours) at 12/17/2022 0953 Last data filed at 12/16/2022 2050 Gross per 24 hour  Intake 89.54 ml  Output 900 ml  Net -810.46 ml   Filed Weights   12/13/22 0128 12/14/22 0108 12/17/22 0500  Weight: 69.6 kg 71.8 kg 72.2 kg    Physical Exam   General appearance: alert, no distress, and pale Neck: no carotid bruit, no JVD, and thyroid not enlarged, symmetric, no tenderness/mass/nodules Lungs: clear to auscultation bilaterally Heart: irregularly irregular rhythm Abdomen: soft, non-tender; bowel sounds normal; no masses,  no organomegaly Extremities: edema 1+ LE Pulses: 2+ and symmetric Skin: pale, cool. dry Neurologic: Mental status: awake, but confused Psych: pleasant  Inpatient Medications    Scheduled Meds:  amiodarone  400 mg Oral BID   apixaban  10 mg Oral BID   Followed by   Melene Muller ON 12/21/2022] apixaban  5 mg Oral BID   Chlorhexidine Gluconate Cloth  6 each Topical Once per day on Monday Tuesday Wednesday Thursday Friday Saturday   feeding supplement  237 mL Oral TID BM   folic acid  1 mg Oral Daily   furosemide  40 mg Intravenous BID   levothyroxine  100  mcg Oral Q0600   pantoprazole  40 mg Oral Daily   potassium chloride  40 mEq Oral Q6H   ranolazine  500 mg Oral BID   rosuvastatin  20 mg Oral QPM   sodium chloride flush  10-40 mL Intracatheter Q12H   thiamine  100 mg Oral Daily    Continuous Infusions:  sodium chloride Stopped (12/11/22 0913)   magnesium sulfate bolus IVPB      PRN Meds: acetaminophen, docusate sodium, Gerhardt's butt cream, ondansetron (ZOFRAN) IV, mouth rinse, polyethylene glycol, sodium chloride flush   Labs   Results for orders placed or performed during the hospital encounter of 12/09/22 (from the past 48 hour(s))  Magnesium     Status: Abnormal   Collection Time: 12/16/22  4:01 AM  Result Value Ref Range   Magnesium 1.6 (L) 1.7 - 2.4 mg/dL    Comment: Performed at [facility], [address] [place], [place] [address]  C-reactive protein     Status: None   Collection Time: 12/16/22  4:01 AM  Result Value Ref Range   CRP 0.9 <1.0 mg/dL    Comment: Performed at [facility], [address], [place], [place] [address]  Procalcitonin     Status: None   Collection Time: 12/16/22  4:01 AM  Result Value Ref Range   Procalcitonin <0.10 ng/mL    Comment:        Interpretation: PCT (Procalcitonin) <= 0.5 ng/mL: Systemic infection (sepsis) is not likely. Local bacterial infection is possible. (NOTE)       Sepsis PCT Algorithm           Lower Respiratory Tract                                      Infection PCT Algorithm    ----------------------------     ----------------------------         PCT < 0.25 ng/mL                PCT < 0.10 ng/mL          Strongly encourage             Strongly discourage   discontinuation of antibiotics    initiation of antibiotics    ----------------------------     -----------------------------       PCT 0.25 - 0.50 ng/mL            PCT 0.10 - 0.25 ng/mL               OR       >80% decrease in PCT            Discourage initiation of                                             antibiotics      Encourage discontinuation           of antibiotics    ----------------------------     -----------------------------         PCT >= 0.50 ng/mL              PCT 0.26 - 0.50 ng/mL               AND        <80% decrease in PCT             Encourage initiation of                                             antibiotics       Encourage continuation           of antibiotics    ----------------------------     -----------------------------        PCT >= 0.50 ng/mL                  PCT > 0.50 ng/mL               AND         increase in PCT                  Strongly encourage                                      initiation of antibiotics    Strongly encourage escalation  of antibiotics                                     -----------------------------                                           PCT <= 0.25 ng/mL                                                 OR                                        > 80% decrease in PCT                                      Discontinue / Do not initiate                                             antibiotics  Performed at [facility], [address] [place], [place] [address]   CBC with Differential/Platelet     Status: Abnormal   Collection Time: 12/16/22  4:01 AM  Result Value Ref Range   WBC 10.2 4.0 - 10.5 K/uL   RBC 3.17 (L) 4.22 - 5.81 MIL/uL   Hemoglobin 9.6 (L) 13.0 - 17.0 g/dL   HCT 84.1 (L) 66.0 - 63.0 %   MCV 95.3 80.0 - 100.0 fL   MCH 30.3 26.0 - 34.0 pg   MCHC 31.8 30.0 - 36.0 g/dL   RDW 16.0 (H) 10.9 - 32.3 %   Platelets 180 150 - 400 K/uL   nRBC 0.2 0.0 - 0.2 %   Neutrophils Relative % 75 %   Neutro Abs 7.7 1.7 - 7.7 K/uL   Lymphocytes Relative 17 %   Lymphs Abs 1.7 0.7 - 4.0 K/uL   Monocytes Relative 5 %   Monocytes Absolute 0.5 0.1 - 1.0 K/uL   Eosinophils Relative 2 %   Eosinophils Absolute 0.2 0.0 - 0.5 K/uL   Basophils Relative 0 %   Basophils Absolute 0.0 0.0 - 0.1 K/uL    Immature Granulocytes 1 %   Abs Immature Granulocytes 0.11 (H) 0.00 - 0.07 K/uL    Comment: Performed at [facility], [address] [place], [place] [address]  Brain natriuretic peptide     Status: Abnormal   Collection Time: 12/16/22  4:01 AM  Result Value Ref Range   B Natriuretic Peptide 1,571.1 (H) 0.0 - 100.0 pg/mL    Comment: Performed at [facility], [address] [place], [place] [address]  Basic metabolic panel     Status: Abnormal   Collection Time: 12/16/22  4:01 AM  Result Value Ref Range   Sodium 139 135 - 145 mmol/L   Potassium 3.3 (L) 3.5 - 5.1 mmol/L   Chloride 103 98 - 111 mmol/L   CO2  28 22 - 32 mmol/L   Glucose, Bld 113 (H) 70 - 99 mg/dL    Comment: Glucose reference range applies only to samples taken after fasting for at least 8 hours.   BUN 9 8 - 23 mg/dL   Creatinine, Ser 1.61 0.61 - 1.24 mg/dL   Calcium 7.9 (L) 8.9 - 10.3 mg/dL   GFR, Estimated >09 >60 mL/min    Comment: (NOTE) Calculated using the CKD-EPI Creatinine Equation (2021)    Anion gap 8 5 - 15    Comment: Performed at [facility], [address] [place], [place] [address]  Magnesium     Status: None   Collection Time: 12/17/22  3:23 AM  Result Value Ref Range   Magnesium 1.8 1.7 - 2.4 mg/dL    Comment: Performed at [facility], [address], [place], [place] [address]  C-reactive protein     Status: None   Collection Time: 12/17/22  3:23 AM  Result Value Ref Range   CRP 0.8 <1.0 mg/dL    Comment: Performed at [facility], [address] [place], [place] [address]  Procalcitonin     Status: None   Collection Time: 12/17/22  3:23 AM  Result Value Ref Range   Procalcitonin <0.10 ng/mL    Comment:        Interpretation: PCT (Procalcitonin) <= 0.5 ng/mL: Systemic infection (sepsis) is not likely. Local bacterial infection is possible. (NOTE)       Sepsis PCT Algorithm           Lower Respiratory Tract                                      Infection  PCT Algorithm    ----------------------------     ----------------------------         PCT < 0.25 ng/mL                PCT < 0.10 ng/mL          Strongly encourage             Strongly discourage   discontinuation of antibiotics    initiation of antibiotics    ----------------------------     -----------------------------       PCT 0.25 - 0.50 ng/mL            PCT 0.10 - 0.25 ng/mL               OR       >80% decrease in PCT            Discourage initiation of                                            antibiotics      Encourage discontinuation           of antibiotics    ----------------------------     -----------------------------         PCT >= 0.50 ng/mL              PCT 0.26 - 0.50 ng/mL               AND        <80% decrease in PCT  Encourage initiation of                                             antibiotics       Encourage continuation           of antibiotics    ----------------------------     -----------------------------        PCT >= 0.50 ng/mL                  PCT > 0.50 ng/mL               AND         increase in PCT                  Strongly encourage                                      initiation of antibiotics    Strongly encourage escalation           of antibiotics                                     -----------------------------                                           PCT <= 0.25 ng/mL                                                 OR                                        > 80% decrease in PCT                                      Discontinue / Do not initiate                                             antibiotics  Performed at [facility], [address] [place], [place] [address]   CBC with Differential/Platelet     Status: Abnormal   Collection Time: 12/17/22  3:23 AM  Result Value Ref Range   WBC 9.6 4.0 - 10.5 K/uL   RBC 3.18 (L) 4.22 - 5.81 MIL/uL   Hemoglobin 9.9 (L) 13.0 - 17.0 g/dL   HCT 19.1 (L) 47.8 - 29.5 %   MCV  95.6 80.0 - 100.0 fL   MCH 31.1 26.0 - 34.0 pg   MCHC 32.6 30.0 - 36.0 g/dL   RDW 62.1 (H) 30.8 - 65.7 %   Platelets 174 150 - 400 K/uL   nRBC 0.0 0.0 - 0.2 %  Neutrophils Relative % 75 %   Neutro Abs 7.2 1.7 - 7.7 K/uL   Lymphocytes Relative 15 %   Lymphs Abs 1.5 0.7 - 4.0 K/uL   Monocytes Relative 6 %   Monocytes Absolute 0.6 0.1 - 1.0 K/uL   Eosinophils Relative 3 %   Eosinophils Absolute 0.3 0.0 - 0.5 K/uL   Basophils Relative 0 %   Basophils Absolute 0.0 0.0 - 0.1 K/uL   Immature Granulocytes 1 %   Abs Immature Granulocytes 0.07 0.00 - 0.07 K/uL    Comment: Performed at [facility], [address], [place], [place] [address]  Brain natriuretic peptide     Status: Abnormal   Collection Time: 12/17/22  3:23 AM  Result Value Ref Range   B Natriuretic Peptide 1,309.5 (H) 0.0 - 100.0 pg/mL    Comment: Performed at [facility], [address], [place], [place] [address]  Basic metabolic panel     Status: Abnormal   Collection Time: 12/17/22  3:23 AM  Result Value Ref Range   Sodium 136 135 - 145 mmol/L   Potassium 3.2 (L) 3.5 - 5.1 mmol/L   Chloride 101 98 - 111 mmol/L   CO2 28 22 - 32 mmol/L   Glucose, Bld 114 (H) 70 - 99 mg/dL    Comment: Glucose reference range applies only to samples taken after fasting for at least 8 hours.   BUN 10 8 - 23 mg/dL   Creatinine, Ser 7.56 0.61 - 1.24 mg/dL   Calcium 7.7 (L) 8.9 - 10.3 mg/dL   GFR, Estimated >43 >32 mL/min    Comment: (NOTE) Calculated using the CKD-EPI Creatinine Equation (2021)    Anion gap 7 5 - 15    Comment: Performed at [facility], [address], [place], [place] [address]    ECG   Afib with QTc ~500 msec - Personally Reviewed  Telemetry   Afib with PVC's - Personally Reviewed  Radiology    No results found.  Cardiac Studies   N/A  Assessment   Principal Problem:   Septic shock (HCC) Active Problems:   Persistent atrial fibrillation (HCC)   Acute systolic (congestive)  heart failure (HCC)   Pressure injury of skin   Plan   Continue diuresis - creatinine stable, BNP improving. Remains in afib with rates around 100 on oral amiodarone.  Time Spent Directly with Patient:  I have spent a total of 25 minutes with the patient reviewing hospital notes, telemetry, EKGs, labs and examining the patient as well as establishing an assessment and plan that was discussed personally with the patient.  > 50% of time was spent in direct patient care.  Length of Stay:  LOS: 7 days   Chrystie Nose, MD, [facility], FACP  [place]  [facility]  Medical Director of the [facility]  [facility] Risk [facility] Diplomate of the American Board of Clinical Lipidology Attending Cardiologist  Direct Dial: [phone]  Fax: [phone]  Website:  [email or web address] 12/17/2022, 9:53 AM

## 2022-12-18 DIAGNOSIS — A419 Sepsis, unspecified organism: Secondary | ICD-10-CM | POA: Diagnosis not present

## 2022-12-18 DIAGNOSIS — I4819 Other persistent atrial fibrillation: Secondary | ICD-10-CM | POA: Diagnosis not present

## 2022-12-18 DIAGNOSIS — I5021 Acute systolic (congestive) heart failure: Secondary | ICD-10-CM | POA: Diagnosis not present

## 2022-12-18 DIAGNOSIS — R6521 Severe sepsis with septic shock: Secondary | ICD-10-CM | POA: Diagnosis not present

## 2022-12-18 LAB — CBC WITH DIFFERENTIAL/PLATELET
Abs Immature Granulocytes: 0.08 10*3/uL — ABNORMAL HIGH (ref 0.00–0.07)
Basophils Absolute: 0 10*3/uL (ref 0.0–0.1)
Basophils Relative: 0 %
Eosinophils Absolute: 0.2 10*3/uL (ref 0.0–0.5)
Eosinophils Relative: 3 %
HCT: 29.1 % — ABNORMAL LOW (ref 39.0–52.0)
Hemoglobin: 9.4 g/dL — ABNORMAL LOW (ref 13.0–17.0)
Immature Granulocytes: 1 %
Lymphocytes Relative: 20 %
Lymphs Abs: 1.7 10*3/uL (ref 0.7–4.0)
MCH: 31.1 pg (ref 26.0–34.0)
MCHC: 32.3 g/dL (ref 30.0–36.0)
MCV: 96.4 fL (ref 80.0–100.0)
Monocytes Absolute: 0.7 10*3/uL (ref 0.1–1.0)
Monocytes Relative: 8 %
Neutro Abs: 5.9 10*3/uL (ref 1.7–7.7)
Neutrophils Relative %: 68 %
Platelets: 172 10*3/uL (ref 150–400)
RBC: 3.02 MIL/uL — ABNORMAL LOW (ref 4.22–5.81)
RDW: 18.6 % — ABNORMAL HIGH (ref 11.5–15.5)
WBC: 8.6 10*3/uL (ref 4.0–10.5)
nRBC: 0 % (ref 0.0–0.2)

## 2022-12-18 LAB — BASIC METABOLIC PANEL
Anion gap: 9 (ref 5–15)
BUN: 9 mg/dL (ref 8–23)
CO2: 28 mmol/L (ref 22–32)
Calcium: 7.9 mg/dL — ABNORMAL LOW (ref 8.9–10.3)
Chloride: 98 mmol/L (ref 98–111)
Creatinine, Ser: 0.99 mg/dL (ref 0.61–1.24)
GFR, Estimated: >60 mL/min (ref >60)
Glucose, Bld: 107 mg/dL — ABNORMAL HIGH (ref 70–99)
Potassium: 3.4 mmol/L — ABNORMAL LOW (ref 3.5–5.1)
Sodium: 135 mmol/L (ref 135–145)

## 2022-12-18 LAB — TSH: TSH: 2.819 u[IU]/mL (ref 0.350–4.500)

## 2022-12-18 LAB — BRAIN NATRIURETIC PEPTIDE: B Natriuretic Peptide: 878 pg/mL — ABNORMAL HIGH (ref 0.0–100.0)

## 2022-12-18 LAB — MAGNESIUM: Magnesium: 2 mg/dL (ref 1.7–2.4)

## 2022-12-18 LAB — VITAMIN B12: Vitamin B-12: 239 pg/mL (ref 180–914)

## 2022-12-18 MED ORDER — CYANOCOBALAMIN 1000 MCG/ML IJ SOLN: 1000.0000 ug | INTRAMUSCULAR | Status: DC

## 2022-12-18 MED ORDER — POTASSIUM CHLORIDE CRYS ER 20 MEQ PO TBCR
40.0000 meq | EXTENDED_RELEASE_TABLET | Freq: Four times a day (QID) | ORAL | Status: DC
Start: 2022-12-18 — End: 2022-12-18

## 2022-12-18 MED ORDER — CYANOCOBALAMIN 1000 MCG/ML IJ SOLN
1000.0000 ug | Freq: Every day | INTRAMUSCULAR | Status: DC
  Administered 2022-12-18 – 2022-12-21 (×4): 1000 ug via SUBCUTANEOUS
  Filled 2022-12-18 (×4): qty 1

## 2022-12-18 MED ORDER — ADULT MULTIVITAMIN W/MINERALS CH
1.0000 | ORAL_TABLET | Freq: Every day | ORAL | Status: DC
  Administered 2022-12-18 – 2022-12-21 (×4): 1 via ORAL
  Filled 2022-12-18 (×4): qty 1

## 2022-12-18 MED ORDER — MIRTAZAPINE 15 MG PO TBDP
15.0000 mg | ORAL_TABLET | Freq: Every day | ORAL | Status: DC
  Administered 2022-12-18 – 2022-12-19 (×2): 15 mg via ORAL
  Filled 2022-12-18 (×4): qty 1

## 2022-12-18 MED ORDER — APIXABAN 5 MG PO TABS
10.0000 mg | ORAL_TABLET | Freq: Two times a day (BID) | ORAL | Status: AC
  Administered 2022-12-18 – 2022-12-20 (×4): 10 mg via ORAL
  Filled 2022-12-18 (×5): qty 2

## 2022-12-18 MED ORDER — POTASSIUM CHLORIDE CRYS ER 20 MEQ PO TBCR
30.0000 meq | EXTENDED_RELEASE_TABLET | Freq: Four times a day (QID) | ORAL | Status: AC
  Administered 2022-12-18 (×2): 30 meq via ORAL
  Filled 2022-12-18 (×2): qty 1

## 2022-12-18 MED ORDER — APIXABAN 5 MG PO TABS
5.0000 mg | ORAL_TABLET | Freq: Two times a day (BID) | ORAL | Status: DC
  Administered 2022-12-21: 5 mg via ORAL
  Filled 2022-12-18: qty 1

## 2022-12-18 NOTE — Progress Notes (Signed)
Initial Nutrition Assessment  DOCUMENTATION CODES:   Non-severe (moderate) malnutrition in context of acute illness/injury  INTERVENTION:  Continue with Ensure TID, Provide house snack in the evening   NUTRITION DIAGNOSIS:   Moderate Malnutrition related to acute illness as evidenced by energy intake < 75% for > or equal to 1 month, moderate muscle depletion, moderate fat depletion.    GOAL:   Provide needs based on ASPEN/SCCM guidelines    MONITOR:   PO intake, Supplement acceptance, Weight trends, Labs  REASON FOR ASSESSMENT:   Consult Poor PO, Assessment of nutrition requirement/status  ASSESSMENT:   82 y.o. M, Admitted for septic shock, finished 7 day Zosyn, Pmhx; L-spine stenosis, anemia, CAD, GERD, poor functional status with confusion. With reported poor oral intake, poor appetite and food pocketing. SLP review revealed that she deems pt to be safe on current diet although if it limits intake he can be advanced to regular diet with out another consult.    Pt setting up time of visit. Sitter at bed side. He stated that UBW was 170# and has dropped down to 148#,resulting in 13% weight change x 1 month.  He said he thought he was eating enough but his nurse disagrees. He stated no appetite, Lost his wife about 4 months ago and has had COVID recently.  Educated on small frequent meals to help with oral intake. Sitter stated he will drink all of his ensures~1080 kcal/day. Offered snack and he was agreeable to get snack in evening.   Meds;  Loenox, Folic Acid, Lasix, Remeron(9/20), MVM, protonix, B12 Labs; K;3.2  NUTRITION - FOCUSED PHYSICAL EXAM:  Flowsheet Row Most Recent Value  Orbital Region Moderate depletion  Upper Arm Region Severe depletion  Thoracic and Lumbar Region Moderate depletion  Buccal Region Severe depletion  Temple Region Severe depletion  Clavicle Bone Region Severe depletion  Clavicle and Acromion Bone Region Moderate depletion  Scapular Bone  Region Moderate depletion  Dorsal Hand Severe depletion  Patellar Region Moderate depletion  Anterior Thigh Region Moderate depletion  Posterior Calf Region Moderate depletion  Edema (RD Assessment) None  Hair Reviewed  Eyes Reviewed  Mouth Reviewed  Skin Reviewed  Nails Reviewed       Diet Order:   Diet Order             DIET DYS 3 Room service appropriate? Yes with Assist; Fluid consistency: Thin  Diet effective now                   EDUCATION NEEDS:   Education needs have been addressed  Skin:  Skin Assessment: Reviewed RN Assessment (STG 2, pretibial)  Last BM:  2/19  Height:   Ht Readings from Last 1 Encounters:  12/09/22 5\' 8"  (1.727 m)    Weight:   Wt Readings from Last 1 Encounters:  12/17/22 72.2 kg    Ideal Body Weight:  70 kg  BMI:  Body mass index is 24.2 kg/m.  Estimated Nutritional Needs:   Kcal:  1610-9604 kcal  Protein:  95-109 g  Fluid:  5409-8119 ml    Ricardo Jericho, RDN, LDN

## 2022-12-18 NOTE — Progress Notes (Signed)
DAILY PROGRESS NOTE   Patient Name: Bradley Oconnor Date of Encounter: 12/18/2022 Cardiologist: Nicki Guadalajara, MD  Chief Complaint   No issues overnight  Patient Profile   Bradley Oconnor is a 82 y.o. male with a hx of CAD status post CABG, hyperlipidemia, hypertension, GERD, anemia who is being seen 12/15/2022 for the evaluation of new onset HFrEF at the request of Dr. Thedore Mins.   Subjective   Net negative 2.6L yesterday - now overall 10L positive.  Creatinine stable. BNP down to 878 (from 1309).  MRI head yesterday was negative.   Objective   Vitals:   12/17/22 1705 12/17/22 2112 12/17/22 2348 12/18/22 0400  BP:  117/71 97/63 112/67  Pulse:  82 86   Resp:  20 18 14   Temp:  98.3 F (36.8 C) 97.7 F (36.5 C)   TempSrc:  Oral Oral   SpO2: 93% 90% 93%   Weight:      Height:        Intake/Output Summary (Last 24 hours) at 12/18/2022 0952 Last data filed at 12/18/2022 2952 Gross per 24 hour  Intake --  Output 2600 ml  Net -2600 ml   Filed Weights   12/13/22 0128 12/14/22 0108 12/17/22 0500  Weight: 69.6 kg 71.8 kg 72.2 kg    Physical Exam   General appearance: alert, no distress, and pale Neck: no carotid bruit, no JVD, and thyroid not enlarged, symmetric, no tenderness/mass/nodules Lungs: clear to auscultation bilaterally Heart: irregularly irregular rhythm Abdomen: soft, non-tender; bowel sounds normal; no masses,  no organomegaly Extremities: edema trace Pulses: 2+ and symmetric Skin: pale, cool. dry Neurologic: Mental status: awake, but confused Psych: pleasant  Inpatient Medications    Scheduled Meds:  amiodarone  400 mg Oral BID   Chlorhexidine Gluconate Cloth  6 each Topical Once per day on Monday Tuesday Wednesday Thursday Friday Saturday   enoxaparin (LOVENOX) injection  70 mg Subcutaneous Q12H   feeding supplement  237 mL Oral TID BM   folic acid  1 mg Oral Daily   furosemide  40 mg Intravenous BID   levothyroxine  100 mcg Oral Q0600    multivitamin with minerals  1 tablet Oral Daily   pantoprazole  40 mg Oral Daily   potassium chloride  30 mEq Oral Q6H   ranolazine  500 mg Oral BID   rosuvastatin  20 mg Oral QPM   sodium chloride flush  10-40 mL Intracatheter Q12H   thiamine  100 mg Oral Daily    Continuous Infusions:  sodium chloride Stopped (12/11/22 0913)    PRN Meds: acetaminophen, docusate sodium, Gerhardt's butt cream, ondansetron (ZOFRAN) IV, mouth rinse, polyethylene glycol, sodium chloride flush   Labs   Results for orders placed or performed during the hospital encounter of 12/09/22 (from the past 48 hour(s))  Magnesium     Status: None   Collection Time: 12/17/22  3:23 AM  Result Value Ref Range   Magnesium 1.8 1.7 - 2.4 mg/dL    Comment: Performed at [facility], [address], [place], [place] [address]  C-reactive protein     Status: None   Collection Time: 12/17/22  3:23 AM  Result Value Ref Range   CRP 0.8 <1.0 mg/dL    Comment: Performed at [facility], [address] [place], [place] [address]  Procalcitonin     Status: None   Collection Time: 12/17/22  3:23 AM  Result Value Ref Range   Procalcitonin <0.10 ng/mL  Comment:        Interpretation: PCT (Procalcitonin) <= 0.5 ng/mL: Systemic infection (sepsis) is not likely. Local bacterial infection is possible. (NOTE)       Sepsis PCT Algorithm           Lower Respiratory Tract                                      Infection PCT Algorithm    ----------------------------     ----------------------------         PCT < 0.25 ng/mL                PCT < 0.10 ng/mL          Strongly encourage             Strongly discourage   discontinuation of antibiotics    initiation of antibiotics    ----------------------------     -----------------------------       PCT 0.25 - 0.50 ng/mL            PCT 0.10 - 0.25 ng/mL               OR       >80% decrease in PCT            Discourage initiation of                                             antibiotics      Encourage discontinuation           of antibiotics    ----------------------------     -----------------------------         PCT >= 0.50 ng/mL              PCT 0.26 - 0.50 ng/mL               AND        <80% decrease in PCT             Encourage initiation of                                             antibiotics       Encourage continuation           of antibiotics    ----------------------------     -----------------------------        PCT >= 0.50 ng/mL                  PCT > 0.50 ng/mL               AND         increase in PCT                  Strongly encourage                                      initiation of antibiotics    Strongly encourage escalation           of antibiotics                                     -----------------------------  PCT <= 0.25 ng/mL                                                 OR                                        > 80% decrease in PCT                                      Discontinue / Do not initiate                                             antibiotics  Performed at [facility], [address], [place], [place] [address]   CBC with Differential/Platelet     Status: Abnormal   Collection Time: 12/17/22  3:23 AM  Result Value Ref Range   WBC 9.6 4.0 - 10.5 K/uL   RBC 3.18 (L) 4.22 - 5.81 MIL/uL   Hemoglobin 9.9 (L) 13.0 - 17.0 g/dL   HCT 40.1 (L) 02.7 - 25.3 %   MCV 95.6 80.0 - 100.0 fL   MCH 31.1 26.0 - 34.0 pg   MCHC 32.6 30.0 - 36.0 g/dL   RDW 66.4 (H) 40.3 - 47.4 %   Platelets 174 150 - 400 K/uL   nRBC 0.0 0.0 - 0.2 %   Neutrophils Relative % 75 %   Neutro Abs 7.2 1.7 - 7.7 K/uL   Lymphocytes Relative 15 %   Lymphs Abs 1.5 0.7 - 4.0 K/uL   Monocytes Relative 6 %   Monocytes Absolute 0.6 0.1 - 1.0 K/uL   Eosinophils Relative 3 %   Eosinophils Absolute 0.3 0.0 - 0.5 K/uL   Basophils Relative 0 %   Basophils Absolute 0.0 0.0 - 0.1 K/uL   Immature  Granulocytes 1 %   Abs Immature Granulocytes 0.07 0.00 - 0.07 K/uL    Comment: Performed at [facility], [address] [place], [place] [address]  Brain natriuretic peptide     Status: Abnormal   Collection Time: 12/17/22  3:23 AM  Result Value Ref Range   B Natriuretic Peptide 1,309.5 (H) 0.0 - 100.0 pg/mL    Comment: Performed at [facility], [address], [place], [place] [address]  Basic metabolic panel     Status: Abnormal   Collection Time: 12/17/22  3:23 AM  Result Value Ref Range   Sodium 136 135 - 145 mmol/L   Potassium 3.2 (L) 3.5 - 5.1 mmol/L   Chloride 101 98 - 111 mmol/L   CO2 28 22 - 32 mmol/L   Glucose, Bld 114 (H) 70 - 99 mg/dL    Comment: Glucose reference range applies only to samples taken after fasting for at least 8 hours.   BUN 10 8 - 23 mg/dL   Creatinine, Ser 4.33 0.61 - 1.24 mg/dL   Calcium 7.7 (L) 8.9 - 10.3 mg/dL   GFR, Estimated >29 >51 mL/min    Comment: (NOTE) Calculated using the CKD-EPI Creatinine Equation (2021)  Anion gap 7 5 - 15    Comment: Performed at [facility], [address] [place], [place] [address]  Magnesium     Status: None   Collection Time: 12/18/22  4:17 AM  Result Value Ref Range   Magnesium 2.0 1.7 - 2.4 mg/dL    Comment: Performed at [facility], [address], [place], [place] [address]  CBC with Differential/Platelet     Status: Abnormal   Collection Time: 12/18/22  4:17 AM  Result Value Ref Range   WBC 8.6 4.0 - 10.5 K/uL   RBC 3.02 (L) 4.22 - 5.81 MIL/uL   Hemoglobin 9.4 (L) 13.0 - 17.0 g/dL   HCT 44.0 (L) 10.2 - 72.5 %   MCV 96.4 80.0 - 100.0 fL   MCH 31.1 26.0 - 34.0 pg   MCHC 32.3 30.0 - 36.0 g/dL   RDW 36.6 (H) 44.0 - 34.7 %   Platelets 172 150 - 400 K/uL   nRBC 0.0 0.0 - 0.2 %   Neutrophils Relative % 68 %   Neutro Abs 5.9 1.7 - 7.7 K/uL   Lymphocytes Relative 20 %   Lymphs Abs 1.7 0.7 - 4.0 K/uL   Monocytes Relative 8 %   Monocytes Absolute 0.7 0.1 - 1.0 K/uL    Eosinophils Relative 3 %   Eosinophils Absolute 0.2 0.0 - 0.5 K/uL   Basophils Relative 0 %   Basophils Absolute 0.0 0.0 - 0.1 K/uL   Immature Granulocytes 1 %   Abs Immature Granulocytes 0.08 (H) 0.00 - 0.07 K/uL    Comment: Performed at [facility], [address] [place], [place] [address]  Brain natriuretic peptide     Status: Abnormal   Collection Time: 12/18/22  4:17 AM  Result Value Ref Range   B Natriuretic Peptide 878.0 (H) 0.0 - 100.0 pg/mL    Comment: Performed at [facility], [address] [place], [place] [address]  Basic metabolic panel     Status: Abnormal   Collection Time: 12/18/22  4:17 AM  Result Value Ref Range   Sodium 135 135 - 145 mmol/L   Potassium 3.4 (L) 3.5 - 5.1 mmol/L   Chloride 98 98 - 111 mmol/L   CO2 28 22 - 32 mmol/L   Glucose, Bld 107 (H) 70 - 99 mg/dL    Comment: Glucose reference range applies only to samples taken after fasting for at least 8 hours.   BUN 9 8 - 23 mg/dL   Creatinine, Ser 6.43 0.61 - 1.24 mg/dL   Calcium 7.9 (L) 8.9 - 10.3 mg/dL   GFR, Estimated >32 >95 mL/min    Comment: (NOTE) Calculated using the CKD-EPI Creatinine Equation (2021)    Anion gap 9 5 - 15    Comment: Performed at [facility], [address], [place], [place] [address]  TSH     Status: None   Collection Time: 12/18/22  4:17 AM  Result Value Ref Range   TSH 2.819 0.350 - 4.500 uIU/mL    Comment: Performed by a 3rd Generation assay with a functional sensitivity of <=0.01 uIU/mL. Performed at [facility], [address] [place], [place] [address]     ECG   Afib with QTc ~500 msec - Personally Reviewed  Telemetry   Afib with PVC's - Personally Reviewed  Radiology    MR BRAIN WO CONTRAST  Result Date: 12/17/2022 CLINICAL DATA:  Altered mental status EXAM: MRI HEAD  WITHOUT CONTRAST TECHNIQUE: Multiplanar, multiecho pulse sequences of the brain and surrounding structures were obtained without intravenous contrast.  COMPARISON:  CT head 12/09/2022 FINDINGS: Brain: There is no acute intracranial hemorrhage, extra-axial fluid collection, or acute infarct There is background parenchymal volume loss with prominence of the ventricular system and extra-axial CSF spaces. 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 There are additional small remote infarcts in the bilateral basal ganglia and left cerebellar hemisphere. Additional patchy FLAIR signal abnormality in the supratentorial white matter likely reflects sequela of underlying chronic small-vessel ischemic change. The pituitary and suprasellar region are normal. There is no mass lesion. There is no mass effect or midline shift. Vascular: Normal flow voids. Skull and upper cervical spine: Normal marrow signal. Sinuses/Orbits: The paranasal sinuses are clear. Bilateral lens implants are in place. The globes and orbits are otherwise unremarkable. Other: The mastoid air cells and middle ear cavities are clear. IMPRESSION: No acute intracranial pathology. Electronically Signed   By: Lesia Hausen M.D.   On: 12/17/2022 14:58    Cardiac Studies   N/A  Assessment   Principal Problem:   Septic shock (HCC) Active Problems:   Persistent atrial fibrillation (HCC)   Acute systolic (congestive) heart failure (HCC)   Pressure injury of skin   Plan   Continue diuresis - creatinine stable, BNP improving. Remains in afib with rates around 100 on oral amiodarone. No new recommendations today.  Time Spent Directly with Patient:  I have spent a total of 25 minutes with the patient reviewing hospital notes, telemetry, EKGs, labs and examining the patient as well as establishing an assessment and plan that was discussed personally with the patient.  > 50% of time was spent in direct patient care.  Length of Stay:  LOS: 8 days   Chrystie Nose, MD, [facility], FACP  [place]  [facility]  Medical Director of the Advanced Lipid Disorders &  Cardiovascular  Risk Reduction Clinic Diplomate of the American Board of Clinical Lipidology Attending Cardiologist  Direct Dial: [phone]  Fax: [phone]  Website:  [email or web address] 12/18/2022, 9:52 AM

## 2022-12-18 NOTE — Progress Notes (Signed)
Physical Therapy Treatment Patient Details Name: Bradley Oconnor MRN: [id number] DOB: 23-Mar-1941 Today's Date: 12/18/2022   History of Present Illness 82 y.o. male admitted 12/09/22 with AMS for 3-5 days. Pt with sepsis due to UTI, bil PNA, PE, RLE DVT with hypotension requiring vasopressors and encephalopathy. Central line removed 9/20 AM. PMhx: CAD s/p CABG, HLD, HTN, GERD, hypothyroidism, concern for dementia    PT Comments  Pt received in supine, pleasantly agreeable to therapy session, A&O x2-3, pt needing reminder not to push/pull with LUE today after central line removed in AM (24 hour precaution) and not to pull on tele wires. Pt needing up to maxA for log roll transfer to/from EOB. Poor seated balance needing min to maxA trunk support with static/dynamic seated tasks. Pt c/o lightheadedness/fatigue with EOB sitting and tolerated 4-5 mins EOB prior to return to supine.  Pt continues to benefit from PT services to progress toward functional mobility goals.    12/18/22 1631  Vital Signs  Patient Position (if appropriate) Orthostatic Vitals  Orthostatic Lying   BP- Lying 96/54 (taken 54m prior to session)  Orthostatic Sitting  BP- Sitting (!) 85/60  After return to Supine -BP  101/63  HR  76     If plan is discharge home, recommend the following: Supervision due to cognitive status;Direct supervision/assist for medications management;Assistance with feeding;Assist for transportation;Assistance with cooking/housework;A lot of help with bathing/dressing/bathroom;Two people to help with walking and/or transfers;Direct supervision/assist for financial management   Can travel by private vehicle        Equipment Recommendations  None recommended by PT    Recommendations for Other Services       Precautions / Restrictions Precautions Precautions: Fall Precaution Comments: No heavy lifting with LUE for 24 hours after 9/20 11:20AM per IV team note after central line  removed Restrictions Weight Bearing Restrictions: No     Mobility  Bed Mobility Overal bed mobility: Needs Assistance Bed Mobility: Rolling, Sidelying to Sit, Sit to Sidelying Rolling: Mod assist, Used rails Sidelying to sit: Max assist, HOB elevated, Used rails     Sit to sidelying: Max assist, +2 for safety/equipment, HOB elevated, Used rails General bed mobility comments: log roll to reduce neck discomfort; to R EOB    Transfers                   General transfer comment: defer, pt BP too soft sitting EOB    Ambulation/Gait                   Stairs             Wheelchair Mobility     Tilt Bed    Modified Rankin (Stroke Patients Only)       Balance Overall balance assessment: Needs assistance Sitting-balance support: Bilateral upper extremity supported, Feet supported Sitting balance-Leahy Scale: Poor Sitting balance - Comments: posterior lean increased with dynamic seated tasks (LAQ) and fatigue Postural control: Posterior lean     Standing balance comment: defer                            Cognition Arousal: Alert Behavior During Therapy: WFL for tasks assessed/performed Overall Cognitive Status: Impaired/Different from baseline Area of Impairment: Orientation, Attention, Following commands, Safety/judgement, Problem solving, Memory                 Orientation Level: Disoriented to, Situation Current Attention Level: Focused Memory: Decreased recall of  precautions, Decreased short-term memory Following Commands: Follows one step commands with increased time Safety/Judgement: Decreased awareness of safety   Problem Solving: Slow processing, Difficulty sequencing, Requires tactile cues, Requires verbal cues General Comments: Pt oriented to "[place]" and self, at time appears to be using humor to cover his deficits. Pt asked who was in the room with him and said "my husband" then laughed (it was his brother). Pt  following most simple commands as able needs some multimodal cues for safer technique. Pt needs reminders occasionally not to pull on tele wires.        Exercises Other Exercises Other Exercises: seated BLE AROM: LAQ (trunk support from PTA) x5 reps ea Other Exercises: supine BUE AROM: elbow flex/ext, RUE shoulder flex/ext x5-10 reps ea    General Comments General comments (skin integrity, edema, etc.): see BP in comments above; SpO2/HR WFL on RA      Pertinent Vitals/Pain Pain Assessment Pain Assessment: Faces Faces Pain Scale: Hurts even more Pain Location: R neck and shoulder Pain Descriptors / Indicators: Aching, Sore, Grimacing Pain Intervention(s): Limited activity within patient's tolerance, Monitored during session, Repositioned    Home Living                          Prior Function            PT Goals (current goals can now be found in the care plan section) Acute Rehab PT Goals Patient Stated Goal: return home, get stronger PT Goal Formulation: With family Time For Goal Achievement: 12/27/22 Progress towards PT goals: Progressing toward goals    Frequency    Min 1X/week      PT Plan      Co-evaluation              AM-PAC PT "6 Clicks" Mobility   Outcome Measure  Help needed turning from your back to your side while in a flat bed without using bedrails?: A Lot Help needed moving from lying on your back to sitting on the side of a flat bed without using bedrails?: Total Help needed moving to and from a bed to a chair (including a wheelchair)?: Total Help needed standing up from a chair using your arms (e.g., wheelchair or bedside chair)?: Total Help needed to walk in hospital room?: Total Help needed climbing 3-5 steps with a railing? : Total 6 Click Score: 7    End of Session Equipment Utilized During Treatment: Gait belt Activity Tolerance: Patient tolerated treatment well;Treatment limited secondary to medical complications  (Comment);Other (comment) (low BP sitting EOB limiting tolerance) Patient left: in bed;with call bell/phone within reach;with bed alarm set;with family/visitor present;Other (comment) (heels floated, BUE elevated on pillows for comfort, bed in chair posture, brother in the room) Nurse Communication: Mobility status;Need for lift equipment;Other (comment) (BP) PT Visit Diagnosis: Muscle weakness (generalized) (M62.81);Other symptoms and signs involving the nervous system (R29.898);Other abnormalities of gait and mobility (R26.89)     Time: 7425-9563 PT Time Calculation (min) (ACUTE ONLY): 21 min  Charges:    $Therapeutic Activity: 8-22 mins PT General Charges $$ ACUTE PT VISIT: 1 Visit                     Antone Summons P., PTA Acute Rehabilitation Services Secure Chat Preferred 9a-5:30pm Office: [phone]    Dorathy Kinsman [facility] 12/18/2022, 6:20 PM

## 2022-12-18 NOTE — Progress Notes (Signed)
Central Line to left chest removed per protocol per MD order. Manual pressure applied for 4 mins. Vaseline gauze, gauze, and Tegaderm applied over insertion site. No bleeding or swelling noted. Instructed patient to remain in bed for thirty mins. Educated patient about S/S of infection and when to call MD; no heavy lifting or pressure on left side  of chest for 24 hours; keep dressing dry and intact for 24 hours. Pt verbalized comprehension.

## 2022-12-18 NOTE — Evaluation (Signed)
Clinical/Bedside Swallow Evaluation Patient Details  Name: Bradley Oconnor MRN: [id number] Date of Birth: [birth year]  Today's Date: 12/18/2022 Time: SLP Start Time (ACUTE ONLY): 0910 SLP Stop Time (ACUTE ONLY): 0945 SLP Time Calculation (min) (ACUTE ONLY): 35 min  Past Medical History:  Past Medical History:  Diagnosis Date   Blood transfusion without reported diagnosis    CAD (coronary artery disease)    GERD (gastroesophageal reflux disease)    Heart attack (HCC)    Hyperlipidemia    Hypertension 03/07/12   ECHO-WNL     08/12/11 Lexiscan MyoviewNo significant ischemia demonstrated Low risk scan There is a moderate sized dense scar in the LCX territoy unchanged from the prior study.. Post- stress EF is 40%.   Thyroid disease    Past Surgical History:  Past Surgical History:  Procedure Laterality Date   APPENDECTOMY     CERVICAL SPINE SURGERY     titanium plate in the back of neck   CORONARY ARTERY BYPASS GRAFT     HERNIA REPAIR     SMALL INTESTINE SURGERY     THYROID SURGERY     1/2 thyroid removed on right side   HPI:  82 y.o. male admitted 12/09/22 with AMS for 3-5 days. Pt with sepsis due to UTI, bil PNA, PE, RLE DVT with hypotension requiring vasopressors and encephalopathy. PMhx: CAD s/p CABG, HLD, HTN, GERD, hypothyroidism    Assessment / Plan / Recommendation  Clinical Impression  Patient presents with a functional oropharyngeal swallow. Very mild evidence of prolonged mastication of varying texture solids noted but otherwise patient with full oral clearance and normal appearing airway protection. Although mild confusion may be contributing to RN reports of pocketing, suspect that reports of poor appetite are mostly to blame as patient having no trouble with liquids. Encouraged patient to consume pos in order to heal. Removed some items off of meal tray as patient reporting feeling overwhelmed by quanity of food presented. He began his am meal as this SLP was leaving room.  Day time caregiver present and supportive, verbalizing understanding of information provided. At this time, given missing dentition, Dysphagia 3 diet remains appropriate although if limiting items that patient would enjoy and consume, it would be reasonable to advance to regular without repeat SLP consult. SLP Visit Diagnosis: Dysphagia, unspecified (R13.10)    Aspiration Risk       Diet Recommendation Dysphagia 3 (Mech soft);Thin liquid    Liquid Administration via: Cup;Straw Medication Administration: Whole meds with liquid Supervision: Patient able to self feed Compensations: Small sips/bites;Slow rate Postural Changes: Seated upright at 90 degrees    Other  Recommendations Oral Care Recommendations: Oral care BID    Recommendations for follow up therapy are one component of a multi-disciplinary discharge planning process, led by the attending physician.  Recommendations may be updated based on patient status, additional functional criteria and insurance authorization.  Follow up Recommendations No SLP follow up         Functional Status Assessment Patient has not had a recent decline in their functional status          Swallow Study   General HPI: 82 y.o. male admitted 12/09/22 with AMS for 3-5 days. Pt with sepsis due to UTI, bil PNA, PE, RLE DVT with hypotension requiring vasopressors and encephalopathy. PMhx: CAD s/p CABG, HLD, HTN, GERD, hypothyroidism Type of Study: Bedside Swallow Evaluation Previous Swallow Assessment: none Diet Prior to this Study: Dysphagia 3 (mechanical soft);Thin liquids (Level 0) Temperature Spikes Noted: No Respiratory  Status: Room air History of Recent Intubation: No Behavior/Cognition: Alert;Cooperative;Confused;Pleasant mood (only mild confusion) Oral Cavity Assessment: Within Functional Limits Oral Care Completed by SLP: No Oral Cavity - Dentition: Missing dentition (front 4 teeth knocked out recently) Vision: Functional for  self-feeding Self-Feeding Abilities: Able to feed self Patient Positioning: Upright in bed Baseline Vocal Quality: Normal Volitional Cough: Strong Volitional Swallow: Able to elicit    Oral/Motor/Sensory Function Overall Oral Motor/Sensory Function: Within functional limits   Ice Chips Ice chips: Not tested   Thin Liquid Thin Liquid: Within functional limits Presentation: Self Fed;Straw    Nectar Thick Nectar Thick Liquid: Not tested   Honey Thick Honey Thick Liquid: Not tested   Puree Puree: Within functional limits Presentation: Self Fed;Spoon   Solid     Solid: Within functional limits     [name] MA, CCC-SLP  [name] 12/18/2022,10:00 AM

## 2022-12-18 NOTE — Progress Notes (Signed)
PROGRESS NOTE                                                                                                                                                                                                             Patient Demographics:    Bradley Oconnor, is a 82 y.o. male, DOB - March 09, 1941, ZOX:[id number]  Outpatient Primary MD for the patient is [name], MD    LOS - 8  Admit date - 12/09/2022    Chief Complaint  Patient presents with   Altered Mental Status       Brief Narrative (HPI from H&P)    82 year old gentleman with history of L-spine stenosis, anemia of chronic disease, CAD, GERD, dyslipidemia who is not very active at home and has poor functional status presented on 12/09/2022 with confusion and weakness of 3 to 5-day duration, he was diagnosed with sepsis due to pneumonia, PE and DVT, admitted to ICU.  He was stabilized and transferred to my care on 12/15/2022.  9/12 admitted, CVL and A-line placed, EEG performed    Subjective:    Carolos Harland today denies any complaint, caregiver at bedside his appetite has been poor, but he has been drinking his Ensure and liking it.     Assessment  & Plan :   Septic Shock POA Bilateral Lower Lobe Pneumonia, POA  UTI, POA   -  has finished zosyn for 7 day course, sepsis pathophysiology has resolved, off of pressors, blood pressure improving on midodrine.  Continue to monitor with supportive care.  Acute systolic CHF  - EF 35% on echocardiogram with global hypokinesis.  - Currently symptom-free and compensated, underlying history of CAD.  Currently stable on Eliquis and statin, low-dose beta-blocker when blood pressure allows -carioldoy input greatly appreciated, continue with current IV diuresis, BNP trending down  Persistent A-fib - he remains on oral amiodarone, on Eliquis for anticoagulation. 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   -  Minimize narcotics and benzodiazepines. -CT head with no acute finding, showing stable changes including brain atrophy and old stroke -RI brain has been obtained, showing signs of atrophy, old stroke,  no acute findings -TSH within normal limit, B12 on the lower side -Will start on B12 supplement -Does appear patient with some underlying memory problem, he had rapid deterioration after his recent COVID infection and after his wife died 4 months ago, some possibly COVID encephalopathy past undiagnosed dementia, as well possibly due to some depression after his wife's death.  Starting Remeron to improve his appetite, as well might help with some depression  Failure to thrive Protein calorie malnutrition -Patient with very poor appetite, oral intake, pocketing his food, SLP consulted,, have discussed with nutritionist, he likes his supplements, so we will optimize his supplements to improve his calorie and protein intake, will add mirtazapine as well which should help with depression as well. .   Acute Pulmonary Emboli  Acute Right Lower Extremity DVT, common femoral vein   - on Eliquis now.  Hemodynamically stable.  Acute Kidney Injury  - due to sepsis resolved after supportive care.   Paroxysmal Atrial Fibrillation, Coronary Artery Disease  - no acute issues Eliquis and statin for secondary prevention.  Beta-blocker once blood pressure improves.   Anemia of Critical Illness - Bleeding from IV sites  -  stable now, on Eliquis.   Moderate protein calorie malnutrition - dietitian following on oral diet.    Hypokalemia  hypomagnesemia -Repleted    L. Internal jugular C line discontinued 9/20.       Condition - Extremely  Guarded  Family Communication  :   Discussed with his home caregiver, who is currently at bedside, discussed with brother by phone today as well.  Code Status :  Full  Consults  :  PCCM, Cards  PUD Prophylaxis :  PPI   Procedures  :     EEG - no acute seizures.  CTA  1. Multiple pulmonary emboli on the right. No evidence of right heart strain. 2. Filling defect in the common femoral vein on the right, compatible with DVT. 3. Strandy atelectasis or infiltrate at the lung bases. 4. Small bilateral pleural effusions. 5. Cardiomegaly with multi-vessel coronary artery calcifications. 6. Gallbladder hydrops with multiple layering stones in the gallbladder. 7. Mild gallbladder wall thickening, possible infectious or inflammatory cystitis. 8. Diverticulosis without diverticulitis. 9. 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 Recommend follow-up ultrasound every 2 years  Lower extremity venous duplex.  Right lower extremity acute DVT.   Abdominal ultrasound.  Gallstones, fatty liver.    TTE -  1. Left ventricular ejection fraction, by estimation, is 35 to 40%. The  left ventricle has moderately decreased function. The left ventricle  demonstrates global hypokinesis. Left ventricular diastolic parameters are  indeterminate.   2. Right ventricular systolic function is moderately reduced. The right  ventricular size is moderately enlarged. There is moderately elevated  pulmonary artery systolic pressure. The estimated right ventricular  systolic pressure is 51.0 mmHg.   3. Left atrial size was severely dilated.   4. Right atrial size was mildly dilated.   5. The mitral valve is normal in structure. Moderate to severe mitral  valve regurgitation. No evidence of mitral stenosis.   6. The aortic valve is tricuspid. There is mild calcification of the  aortic valve. Aortic valve regurgitation is mild to moderate. No aortic   stenosis is present. Aortic regurgitation PHT measures 469 msec.   7. Aortic dilatation noted. There is mild dilatation of the ascending  aorta, measuring 40 mm.   8. The inferior vena cava is dilated in size  with <50% respiratory  variability, suggesting right atrial pressure of 15 mmHg.       Disposition Plan  :    Status is: Inpatient   DVT Prophylaxis  :         Lab Results  Component Value Date   PLT 172 12/18/2022    Diet :  Diet Order             DIET DYS 3 Room service appropriate? Yes with Assist; Fluid consistency: Thin  Diet effective now                    Inpatient Medications  Scheduled Meds:  amiodarone  400 mg Oral BID   Chlorhexidine Gluconate Cloth  6 each Topical Once per day on Monday Tuesday Wednesday Thursday Friday Saturday   cyanocobalamin  1,000 mcg Subcutaneous Daily   Followed by   Melene Muller ON 12/25/2022] cyanocobalamin  1,000 mcg Subcutaneous Weekly   enoxaparin (LOVENOX) injection  70 mg Subcutaneous Q12H   feeding supplement  237 mL Oral TID BM   folic acid  1 mg Oral Daily   furosemide  40 mg Intravenous BID   levothyroxine  100 mcg Oral Q0600   mirtazapine  15 mg Oral QHS   multivitamin with minerals  1 tablet Oral Daily   pantoprazole  40 mg Oral Daily   ranolazine  500 mg Oral BID   rosuvastatin  20 mg Oral QPM   sodium chloride flush  10-40 mL Intracatheter Q12H   thiamine  100 mg Oral Daily   Continuous Infusions:  sodium chloride Stopped (12/11/22 0913)   PRN Meds:.acetaminophen, docusate sodium, Gerhardt's butt cream, ondansetron (ZOFRAN) IV, mouth rinse, polyethylene glycol, sodium chloride flush     Objective:   Vitals:   12/17/22 2112 12/17/22 2348 12/18/22 0400 12/18/22 1200  BP: 117/71 97/63 112/67   Pulse: 82 86    Resp: 20 18 14    Temp: 98.3 F (36.8 C) 97.7 F (36.5 C)  97.7 F (36.5 C)  TempSrc: Oral Oral  Oral  SpO2: 90% 93%    Weight:      Height:        Wt Readings from Last 3  Encounters:  12/17/22 72.2 kg  08/11/21 81.5 kg  08/07/21 83 kg     Intake/Output Summary (Last 24 hours) at 12/18/2022 1337 Last data filed at 12/18/2022 0607 Gross per 24 hour  Intake --  Output 1500 ml  Net -1500 ml     Physical Exam  Awake Alert, oriented x 2, frail, deconditioned, in no apparent distress Symmetrical Chest wall movement, Good air movement bilaterally, CTAB RRR,No Gallops,Rubs or new Murmurs, No Parasternal Heave +ve B.Sounds, Abd Soft, No tenderness, No rebound - guarding or rigidity. No Cyanosis, Clubbing or edema, No new Rash or bruise        RN pressure injury documentation: Pressure Injury 12/10/22 Pretibial Proximal;Right Stage 2 -  Partial thickness loss of dermis presenting as a shallow open injury with a red, pink wound bed without slough. (Active)  12/10/22 0865  Location: Pretibial  Location Orientation: Proximal;Right  Staging: Stage 2 -  Partial thickness loss of dermis presenting as a shallow open injury with a red, pink wound bed without slough.  Wound Description (Comments):   Present on Admission: Yes  Dressing Type Foam - Lift dressing to assess site every shift 12/17/22 1951      Data Review:    Recent Labs  Lab 12/14/22 0340 12/15/22 0341  12/16/22 0401 12/17/22 0323 12/18/22 0417  WBC 6.6 9.1 10.2 9.6 8.6  HGB 8.7* 9.9* 9.6* 9.9* 9.4*  HCT 26.1* 31.1* 30.2* 30.4* 29.1*  PLT 141* 162 180 174 172  MCV 91.6 94.8 95.3 95.6 96.4  MCH 30.5 30.2 30.3 31.1 31.1  MCHC 33.3 31.8 31.8 32.6 32.3  RDW 19.7* 19.5* 18.9* 18.7* 18.6*  LYMPHSABS  --   --  1.7 1.5 1.7  MONOABS  --   --  0.5 0.6 0.7  EOSABS  --   --  0.2 0.3 0.2  BASOSABS  --   --  0.0 0.0 0.0    Recent Labs  Lab 12/14/22 0340 12/15/22 0329 12/15/22 0341 12/15/22 0630 12/16/22 0401 12/17/22 0323 12/18/22 0417  NA 139  --  137  --  139 136 135  K 2.9*  --  3.8  --  3.3* 3.2* 3.4*  CL 105  --  105  --  103 101 98  CO2 26  --  24  --  28 28 28   ANIONGAP 8  --   8  --  8 7 9   GLUCOSE 97  --  137*  --  113* 114* 107*  BUN 9  --  8  --  9 10 9   CREATININE 0.83  --  0.92  --  0.90 0.95 0.99  AST  --   --  14*  --   --   --   --   ALT  --   --  14  --   --   --   --   ALKPHOS  --   --  45  --   --   --   --   BILITOT  --   --  1.3*  --   --   --   --   ALBUMIN  --   --  2.5*  --   --   --   --   CRP  --   --   --  0.6 0.9 0.8  --   PROCALCITON  --  <0.10  --   --  <0.10 <0.10  --   TSH  --  1.376  --   --   --   --  2.819  BNP  --  1,649.0*  --   --  1,571.1* 1,309.5* 878.0*  MG 1.9  --  2.0  --  1.6* 1.8 2.0  CALCIUM 7.4*  --  7.6*  --  7.9* 7.7* 7.9*      Recent Labs  Lab 12/14/22 0340 12/15/22 0329 12/15/22 0341 12/15/22 0630 12/16/22 0401 12/17/22 0323 12/18/22 0417  CRP  --   --   --  0.6 0.9 0.8  --   PROCALCITON  --  <0.10  --   --  <0.10 <0.10  --   TSH  --  1.376  --   --   --   --  2.819  BNP  --  1,649.0*  --   --  1,571.1* 1,309.5* 878.0*  MG 1.9  --  2.0  --  1.6* 1.8 2.0  CALCIUM 7.4*  --  7.6*  --  7.9* 7.7* 7.9*    --------------------------------------------------------------------------------------------------------------- Lab Results  Component Value Date   CHOL 132 03/03/2021   HDL 42.50 03/03/2021   LDLCALC 65 03/03/2021   TRIG 124.0 03/03/2021   CHOLHDL 3 03/03/2021      Radiology Reports MR BRAIN WO CONTRAST  Result Date: 12/17/2022 CLINICAL DATA:  Altered mental status EXAM: MRI HEAD WITHOUT CONTRAST TECHNIQUE: Multiplanar, multiecho pulse sequences of the brain and surrounding structures were obtained without intravenous contrast. COMPARISON:  CT head 12/09/2022 FINDINGS: Brain: There is no acute intracranial hemorrhage, extra-axial fluid collection, or acute infarct There is background parenchymal volume loss with prominence of the ventricular system and extra-axial CSF spaces. 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 There are additional small remote infarcts in the bilateral basal  ganglia and left cerebellar hemisphere. Additional patchy FLAIR signal abnormality in the supratentorial white matter likely reflects sequela of underlying chronic small-vessel ischemic change. The pituitary and suprasellar region are normal. There is no mass lesion. There is no mass effect or midline shift. Vascular: Normal flow voids. Skull and upper cervical spine: Normal marrow signal. Sinuses/Orbits: The paranasal sinuses are clear. Bilateral lens implants are in place. The globes and orbits are otherwise unremarkable. Other: The mastoid air cells and middle ear cavities are clear. IMPRESSION: No acute intracranial pathology. Electronically Signed   By: Lesia Hausen M.D.   On: 12/17/2022 14:58   DG Chest Port 1 View  Result Date: 12/15/2022 CLINICAL DATA:  Shortness of breath. EXAM: PORTABLE CHEST 1 VIEW COMPARISON:  12/10/2018 FINDINGS: Mild cardiac enlargement with evidence of prior cardiac surgery. Low bilateral lung volumes with bibasilar atelectasis and small to moderate bilateral pleural effusions. No overt pulmonary edema. No pneumothorax. IMPRESSION: Low lung volumes with bibasilar atelectasis and small to moderate bilateral pleural effusions. Electronically Signed   By: Irish Lack M.D.   On: 12/15/2022 08:03      Signature  -   Huey Bienenstock M.D on 12/18/2022 at 1:37 PM   -  To page go to [email or web address]

## 2022-12-19 DIAGNOSIS — E440 Moderate protein-calorie malnutrition: Secondary | ICD-10-CM | POA: Insufficient documentation

## 2022-12-19 DIAGNOSIS — I5021 Acute systolic (congestive) heart failure: Secondary | ICD-10-CM | POA: Diagnosis not present

## 2022-12-19 DIAGNOSIS — I4819 Other persistent atrial fibrillation: Secondary | ICD-10-CM | POA: Diagnosis not present

## 2022-12-19 DIAGNOSIS — R6521 Severe sepsis with septic shock: Secondary | ICD-10-CM | POA: Diagnosis not present

## 2022-12-19 DIAGNOSIS — A419 Sepsis, unspecified organism: Secondary | ICD-10-CM | POA: Diagnosis not present

## 2022-12-19 LAB — BASIC METABOLIC PANEL
Anion gap: 10 (ref 5–15)
BUN: 11 mg/dL (ref 8–23)
CO2: 25 mmol/L (ref 22–32)
Calcium: 8.4 mg/dL — ABNORMAL LOW (ref 8.9–10.3)
Chloride: 98 mmol/L (ref 98–111)
Creatinine, Ser: 1.04 mg/dL (ref 0.61–1.24)
GFR, Estimated: >60 mL/min (ref >60)
Glucose, Bld: 98 mg/dL (ref 70–99)
Potassium: 4.1 mmol/L (ref 3.5–5.1)
Sodium: 133 mmol/L — ABNORMAL LOW (ref 135–145)

## 2022-12-19 LAB — CBC WITH DIFFERENTIAL/PLATELET
Abs Immature Granulocytes: 0.04 10*3/uL (ref 0.00–0.07)
Basophils Absolute: 0 10*3/uL (ref 0.0–0.1)
Basophils Relative: 0 %
Eosinophils Absolute: 0.1 10*3/uL (ref 0.0–0.5)
Eosinophils Relative: 2 %
HCT: 29.1 % — ABNORMAL LOW (ref 39.0–52.0)
Hemoglobin: 9.5 g/dL — ABNORMAL LOW (ref 13.0–17.0)
Immature Granulocytes: 1 %
Lymphocytes Relative: 18 %
Lymphs Abs: 1.2 10*3/uL (ref 0.7–4.0)
MCH: 31.7 pg (ref 26.0–34.0)
MCHC: 32.6 g/dL (ref 30.0–36.0)
MCV: 97 fL (ref 80.0–100.0)
Monocytes Absolute: 0.5 10*3/uL (ref 0.1–1.0)
Monocytes Relative: 8 %
Neutro Abs: 4.7 10*3/uL (ref 1.7–7.7)
Neutrophils Relative %: 71 %
Platelets: 172 10*3/uL (ref 150–400)
RBC: 3 MIL/uL — ABNORMAL LOW (ref 4.22–5.81)
RDW: 18.5 % — ABNORMAL HIGH (ref 11.5–15.5)
WBC: 6.6 10*3/uL (ref 4.0–10.5)
nRBC: 0 % (ref 0.0–0.2)

## 2022-12-19 LAB — BRAIN NATRIURETIC PEPTIDE: B Natriuretic Peptide: 719.9 pg/mL — ABNORMAL HIGH (ref 0.0–100.0)

## 2022-12-19 LAB — MAGNESIUM: Magnesium: 1.7 mg/dL (ref 1.7–2.4)

## 2022-12-19 MED ORDER — FUROSEMIDE 10 MG/ML IJ SOLN: 40.0000 mg | Freq: Every day | INTRAMUSCULAR | Status: DC

## 2022-12-19 MED ORDER — FUROSEMIDE 10 MG/ML IJ SOLN
40.0000 mg | Freq: Every day | INTRAMUSCULAR | Status: AC
  Administered 2022-12-19: 40 mg via INTRAVENOUS
  Filled 2022-12-19: qty 4

## 2022-12-19 MED ORDER — TORSEMIDE 20 MG PO TABS
40.0000 mg | ORAL_TABLET | Freq: Every day | ORAL | Status: DC
  Administered 2022-12-20 – 2022-12-21 (×2): 40 mg via ORAL
  Filled 2022-12-19 (×2): qty 2

## 2022-12-19 MED ORDER — TORSEMIDE 20 MG PO TABS: 20.0000 mg | ORAL_TABLET | Freq: Every day | ORAL | Status: DC

## 2022-12-19 NOTE — Plan of Care (Signed)
Problem: Education: Goal: Knowledge of General Education information will improve Description: Including pain rating scale, medication(s)/side effects and non-pharmacologic comfort measures Outcome: Progressing   Problem: Clinical Measurements: Goal: Will remain free from infection Outcome: Progressing   Problem: Activity: Goal: Risk for activity intolerance will decrease Outcome: Progressing   Problem: Nutrition: Goal: Adequate nutrition will be maintained Outcome: Progressing   Problem: Safety: Goal: Ability to remain free from injury will improve Outcome: Progressing   Problem: Skin Integrity: Goal: Risk for impaired skin integrity will decrease Outcome: Progressing

## 2022-12-19 NOTE — Progress Notes (Signed)
DAILY PROGRESS NOTE   Patient Name: Bradley Oconnor Date of Encounter: 12/19/2022 Cardiologist: Nicki Guadalajara, MD  Chief Complaint   No issues overnight  Patient Profile   Bradley Oconnor is a 82 y.o. male with a hx of CAD status post CABG, hyperlipidemia, hypertension, GERD, anemia who is being seen 12/15/2022 for the evaluation of new onset HFrEF at the request of Dr. Thedore Mins.   Subjective   Diuresis is slowing - BNP still elevated. Creatinine is slowly climbing. Sodium is low today at 133.  Objective   Vitals:   12/19/22 0800 12/19/22 0900 12/19/22 1135 12/19/22 1300  BP:      Pulse:      Resp:      Temp: 98.1 F (36.7 C)     TempSrc: Oral  Oral   SpO2: 92% 91% 97% 94%  Weight:      Height:        Intake/Output Summary (Last 24 hours) at 12/19/2022 1458 Last data filed at 12/19/2022 1300 Gross per 24 hour  Intake 510 ml  Output 600 ml  Net -90 ml   Filed Weights   12/14/22 0108 12/17/22 0500 12/19/22 0500  Weight: 71.8 kg 72.2 kg 68.3 kg    Physical Exam   General appearance: alert, no distress, and pale Neck: no carotid bruit, no JVD, and thyroid not enlarged, symmetric, no tenderness/mass/nodules Lungs: clear to auscultation bilaterally Heart: irregularly irregular rhythm Abdomen: soft, non-tender; bowel sounds normal; no masses,  no organomegaly Extremities: edema trace Pulses: 2+ and symmetric Skin: pale, cool. dry Neurologic: Mental status: awake, but confused Psych: pleasant  Inpatient Medications    Scheduled Meds:  amiodarone  400 mg Oral BID   apixaban  10 mg Oral BID   Followed by   Melene Muller ON 12/21/2022] apixaban  5 mg Oral BID   Chlorhexidine Gluconate Cloth  6 each Topical Once per day on Monday Tuesday Wednesday Thursday Friday Saturday   cyanocobalamin  1,000 mcg Subcutaneous Daily   Followed by   Melene Muller ON 12/25/2022] cyanocobalamin  1,000 mcg Subcutaneous Weekly   feeding supplement  237 mL Oral TID BM   folic acid  1 mg Oral  Daily   furosemide  40 mg Intravenous Daily   levothyroxine  100 mcg Oral Q0600   mirtazapine  15 mg Oral QHS   multivitamin with minerals  1 tablet Oral Daily   pantoprazole  40 mg Oral Daily   ranolazine  500 mg Oral BID   rosuvastatin  20 mg Oral QPM   sodium chloride flush  10-40 mL Intracatheter Q12H   thiamine  100 mg Oral Daily    Continuous Infusions:  sodium chloride Stopped (12/11/22 0913)    PRN Meds: acetaminophen, docusate sodium, Gerhardt's butt cream, ondansetron (ZOFRAN) IV, mouth rinse, polyethylene glycol, sodium chloride flush   Labs   Results for orders placed or performed during the hospital encounter of 12/09/22 (from the past 48 hour(s))  Magnesium     Status: None   Collection Time: 12/18/22  4:17 AM  Result Value Ref Range   Magnesium 2.0 1.7 - 2.4 mg/dL    Comment: Performed at [facility], [address] [place], [place] [address]  CBC with Differential/Platelet     Status: Abnormal   Collection Time: 12/18/22  4:17 AM  Result Value Ref Range   WBC 8.6 4.0 - 10.5 K/uL   RBC 3.02 (L) 4.22 - 5.81 MIL/uL   Hemoglobin 9.4 (L) 13.0 -  17.0 g/dL   HCT 96.0 (L) 45.4 - 09.8 %   MCV 96.4 80.0 - 100.0 fL   MCH 31.1 26.0 - 34.0 pg   MCHC 32.3 30.0 - 36.0 g/dL   RDW 11.9 (H) 14.7 - 82.9 %   Platelets 172 150 - 400 K/uL   nRBC 0.0 0.0 - 0.2 %   Neutrophils Relative % 68 %   Neutro Abs 5.9 1.7 - 7.7 K/uL   Lymphocytes Relative 20 %   Lymphs Abs 1.7 0.7 - 4.0 K/uL   Monocytes Relative 8 %   Monocytes Absolute 0.7 0.1 - 1.0 K/uL   Eosinophils Relative 3 %   Eosinophils Absolute 0.2 0.0 - 0.5 K/uL   Basophils Relative 0 %   Basophils Absolute 0.0 0.0 - 0.1 K/uL   Immature Granulocytes 1 %   Abs Immature Granulocytes 0.08 (H) 0.00 - 0.07 K/uL    Comment: Performed at [facility], [address], [place], [place] [address]  Brain natriuretic peptide     Status: Abnormal   Collection Time: 12/18/22  4:17 AM  Result Value Ref Range   B  Natriuretic Peptide 878.0 (H) 0.0 - 100.0 pg/mL    Comment: Performed at [facility], [address] [place], [place] [address]  Basic metabolic panel     Status: Abnormal   Collection Time: 12/18/22  4:17 AM  Result Value Ref Range   Sodium 135 135 - 145 mmol/L   Potassium 3.4 (L) 3.5 - 5.1 mmol/L   Chloride 98 98 - 111 mmol/L   CO2 28 22 - 32 mmol/L   Glucose, Bld 107 (H) 70 - 99 mg/dL    Comment: Glucose reference range applies only to samples taken after fasting for at least 8 hours.   BUN 9 8 - 23 mg/dL   Creatinine, Ser 8.46 0.61 - 1.24 mg/dL   Calcium 7.9 (L) 8.9 - 10.3 mg/dL   GFR, Estimated >96 >29 mL/min    Comment: (NOTE) Calculated using the CKD-EPI Creatinine Equation (2021)    Anion gap 9 5 - 15    Comment: Performed at [facility], [address], [place], [place] [address]  TSH     Status: None   Collection Time: 12/18/22  4:17 AM  Result Value Ref Range   TSH 2.819 0.350 - 4.500 uIU/mL    Comment: Performed by a 3rd Generation assay with a functional sensitivity of <=0.01 uIU/mL. Performed at [facility], [address] [place], [place] [address]   Magnesium     Status: None   Collection Time: 12/19/22  8:37 AM  Result Value Ref Range   Magnesium 1.7 1.7 - 2.4 mg/dL    Comment: Performed at [facility], [address] [place], [place] [address]  CBC with Differential/Platelet     Status: Abnormal   Collection Time: 12/19/22  8:37 AM  Result Value Ref Range   WBC 6.6 4.0 - 10.5 K/uL   RBC 3.00 (L) 4.22 - 5.81 MIL/uL   Hemoglobin 9.5 (L) 13.0 - 17.0 g/dL   HCT 53.6 (L) 64.4 - 03.4 %   MCV 97.0 80.0 - 100.0 fL   MCH 31.7 26.0 - 34.0 pg   MCHC 32.6 30.0 - 36.0 g/dL   RDW 74.2 (H) 59.5 - 63.8 %   Platelets 172 150 - 400 K/uL   nRBC 0.0 0.0 - 0.2 %   Neutrophils Relative % 71 %  Neutro Abs 4.7 1.7 - 7.7 K/uL   Lymphocytes Relative 18 %   Lymphs Abs 1.2 0.7 - 4.0 K/uL   Monocytes Relative 8 %   Monocytes Absolute 0.5 0.1 -  1.0 K/uL   Eosinophils Relative 2 %   Eosinophils Absolute 0.1 0.0 - 0.5 K/uL   Basophils Relative 0 %   Basophils Absolute 0.0 0.0 - 0.1 K/uL   Immature Granulocytes 1 %   Abs Immature Granulocytes 0.04 0.00 - 0.07 K/uL    Comment: Performed at [facility], [address] [place], [place] [address]  Brain natriuretic peptide     Status: Abnormal   Collection Time: 12/19/22  8:37 AM  Result Value Ref Range   B Natriuretic Peptide 719.9 (H) 0.0 - 100.0 pg/mL    Comment: Performed at [facility], [address] [place], [place] [address]  Basic metabolic panel     Status: Abnormal   Collection Time: 12/19/22  8:37 AM  Result Value Ref Range   Sodium 133 (L) 135 - 145 mmol/L   Potassium 4.1 3.5 - 5.1 mmol/L   Chloride 98 98 - 111 mmol/L   CO2 25 22 - 32 mmol/L   Glucose, Bld 98 70 - 99 mg/dL    Comment: Glucose reference range applies only to samples taken after fasting for at least 8 hours.   BUN 11 8 - 23 mg/dL   Creatinine, Ser 1.91 0.61 - 1.24 mg/dL   Calcium 8.4 (L) 8.9 - 10.3 mg/dL   GFR, Estimated >47 >82 mL/min    Comment: (NOTE) Calculated using the CKD-EPI Creatinine Equation (2021)    Anion gap 10 5 - 15    Comment: Performed at [facility], [address] [place], [place] [address]    ECG   Afib with QTc ~500 msec - Personally Reviewed  Telemetry   Afib with PVC's - Personally Reviewed  Radiology    No results found.  Cardiac Studies   N/A  Assessment   Principal Problem:   Septic shock (HCC) Active Problems:   Persistent atrial fibrillation (HCC)   Acute systolic (congestive) heart failure (HCC)   Pressure injury of skin   Malnutrition of moderate degree   Plan   Single dose lasix ordered for tonight- will transition to oral torsemide 40 mg daily tomorrow. Can reduce amiodarone to 200 mg BID on Monday.   Time Spent Directly with Patient:  I have spent a total of 25 minutes with the patient reviewing hospital notes,  telemetry, EKGs, labs and examining the patient as well as establishing an assessment and plan that was discussed personally with the patient.  > 50% of time was spent in direct patient care.  Length of Stay:  LOS: 9 days   Chrystie Nose, MD, [facility], FACP  [place]  [facility]  Medical Director of the [facility] Lipid Disorders &  Cardiovascular Risk Reduction Clinic Diplomate of the American Board of Clinical Lipidology Attending Cardiologist  Direct Dial: [phone]  Fax: [phone]  Website:  [email or web address] 12/19/2022, 2:58 PM

## 2022-12-19 NOTE — Plan of Care (Signed)
Patient weak and poor appetite

## 2022-12-19 NOTE — Progress Notes (Signed)
PROGRESS NOTE                                                                                                                                                                                                             Patient Demographics:    Bradley Oconnor, is a 82 y.o. male, DOB - December 13, 1940, WUJ:[id number]  Outpatient Primary MD for the patient is [name], MD    LOS - 9  Admit date - 12/09/2022    Chief Complaint  Patient presents with   Altered Mental Status       Brief Narrative (HPI from H&P)    82 year old gentleman with history of L-spine stenosis, anemia of chronic disease, CAD, GERD, dyslipidemia who is not very active at home and has poor functional status presented on 12/09/2022 with confusion and weakness of 3 to 5-day duration, he was diagnosed with sepsis due to pneumonia, PE and DVT, admitted to ICU.  He was stabilized and transferred to my care on 12/15/2022.  9/12 admitted, CVL and A-line placed, EEG performed    Subjective:    Bradley Oconnor today no significant events overnight as discussed with staff, he denies any complaints, patient with poor appetite, but he is doing very well with his supplements drink Oconnor his ensures .    Assessment  & Plan :   Septic Shock POA Bilateral Lower Lobe Pneumonia, POA  UTI, POA   -  has finished zosyn for 7 day course, sepsis pathophysiology has resolved, off of pressors, blood pressure improving on midodrine.  Continue to monitor with supportive care.  Acute systolic CHF  - EF 35% on echocardiogram with global hypokinesis.  - Currently symptom-free and compensated, underlying history of CAD.  Currently stable on Eliquis and statin, low-dose beta-blocker when blood pressure allows -carioldoy input greatly appreciated, IV diuresis, BNP trending down, blood pressure started to become soft, he is transition to oral torsemide.  Persistent A-fib - he  remains on oral amiodarone, creased amiodarone on Monday to 200 mg p.o. twice daily per cardiology recommendation on Eliquis for anticoagulation. 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   -  Minimize narcotics and benzodiazepines. -CT head with no acute finding, showing stable changes including brain atrophy and old stroke -RI brain has been obtained, showing signs of atrophy, old stroke, no acute findings -TSH within normal limit, B12 on the lower side -Will start on B12 supplement -Does appear patient with some underlying memory problem, he had rapid deterioration after his recent COVID infection and after his wife died 4 months ago, some possibly COVID encephalopathy past undiagnosed dementia, as well possibly due to some depression after his wife's death.  Starting Remeron to improve his appetite, as well might help with some depression  Failure to thrive Protein calorie malnutrition -Patient with very poor appetite, oral intake, pocketing his food, SLP consulted,, have discussed with nutritionist, he likes his supplements, so we will optimize his supplements to improve his calorie and protein intake, will add mirtazapine as well which should help with depression as well. .   Acute Pulmonary Emboli  Acute Right Lower Extremity DVT, common femoral vein   - on Eliquis now.  Hemodynamically stable.  Acute Kidney Injury  - due to sepsis resolved after supportive care.   Paroxysmal Atrial Fibrillation, Coronary Artery Disease  - no acute issues Eliquis and statin for secondary prevention.  Beta-blocker once blood pressure improves.   Anemia of Critical Illness - Bleeding from IV sites  -  stable now, on Eliquis.    Moderate protein calorie malnutrition - dietitian following on oral diet.    Hypokalemia  hypomagnesemia -Repleted    L. Internal jugular C line discontinued 9/20.       Condition - Extremely Guarded  Family Communication  :   Discussed with his home caregiver, who is currently at bedside, discussed with brother by phone 9/20  Code Status :  Full  Consults  :  PCCM, Cards  PUD Prophylaxis :  PPI   Procedures  :     EEG - no acute seizures.  CTA  1. Multiple pulmonary emboli on the right. No evidence of right heart strain. 2. Filling defect in the common femoral vein on the right, compatible with DVT. 3. Strandy atelectasis or infiltrate at the lung bases. 4. Small bilateral pleural effusions. 5. Cardiomegaly with multi-vessel coronary artery calcifications. 6. Gallbladder hydrops with multiple layering stones in the gallbladder. 7. Mild gallbladder wall thickening, possible infectious or inflammatory cystitis. 8. Diverticulosis without diverticulitis. 9. 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 Recommend follow-up ultrasound every 2 years  Lower extremity venous duplex.  Right lower extremity acute DVT.   Abdominal ultrasound.  Gallstones, fatty liver.    TTE -  1. Left ventricular ejection fraction, by estimation, is 35 to 40%. The  left ventricle has moderately decreased function. The left ventricle  demonstrates global hypokinesis. Left ventricular diastolic parameters are  indeterminate.   2. Right ventricular systolic function is moderately reduced. The right  ventricular size is moderately enlarged. There is moderately elevated  pulmonary artery systolic pressure. The estimated right ventricular  systolic pressure is 51.0 mmHg.   3. Left atrial size was severely dilated.   4. Right atrial size was mildly dilated.   5. The mitral valve is normal in structure. Moderate to severe mitral   valve regurgitation. No evidence of mitral stenosis.   6. The aortic valve is tricuspid. There is mild calcification of the  aortic valve. Aortic valve regurgitation is mild to moderate. No aortic  stenosis is present. Aortic regurgitation PHT measures 469 msec.  7. Aortic dilatation noted. There is mild dilatation of the ascending  aorta, measuring 40 mm.   8. The inferior vena cava is dilated in size with <50% respiratory  variability, suggesting right atrial pressure of 15 mmHg.       Disposition Plan  :    Status is: Inpatient   DVT Prophylaxis  :     apixaban (ELIQUIS) tablet 10 mg  apixaban (ELIQUIS) tablet 5 mg     Lab Results  Component Value Date   PLT 172 12/19/2022    Diet :  Diet Order             DIET DYS 3 Room service appropriate? Yes with Assist; Fluid consistency: Thin  Diet effective now                    Inpatient Medications  Scheduled Meds:  amiodarone  400 mg Oral BID   apixaban  10 mg Oral BID   Followed by   Melene Muller ON 12/21/2022] apixaban  5 mg Oral BID   Chlorhexidine Gluconate Cloth  6 each Topical Once per day on Monday Tuesday Wednesday Thursday Friday Saturday   cyanocobalamin  1,000 mcg Subcutaneous Daily   Followed by   Melene Muller ON 12/25/2022] cyanocobalamin  1,000 mcg Subcutaneous Weekly   feeding supplement  237 mL Oral TID BM   folic acid  1 mg Oral Daily   furosemide  40 mg Intravenous Daily   levothyroxine  100 mcg Oral Q0600   mirtazapine  15 mg Oral QHS   multivitamin with minerals  1 tablet Oral Daily   pantoprazole  40 mg Oral Daily   ranolazine  500 mg Oral BID   rosuvastatin  20 mg Oral QPM   sodium chloride flush  10-40 mL Intracatheter Q12H   thiamine  100 mg Oral Daily   Continuous Infusions:  sodium chloride Stopped (12/11/22 0913)   PRN Meds:.acetaminophen, docusate sodium, Gerhardt's butt cream, ondansetron (ZOFRAN) IV, mouth rinse, polyethylene glycol, sodium chloride flush     Objective:    Vitals:   12/19/22 0500 12/19/22 0800 12/19/22 0900 12/19/22 1135  BP:      Pulse:      Resp:      Temp:  98.1 F (36.7 C)    TempSrc:  Oral  Oral  SpO2:  92% 91% 97%  Weight: 68.3 kg     Height:        Wt Readings from Last 3 Encounters:  12/19/22 68.3 kg  08/11/21 81.5 kg  08/07/21 83 kg     Intake/Output Summary (Last 24 hours) at 12/19/2022 1319 Last data filed at 12/19/2022 1000 Gross per 24 hour  Intake 310 ml  Output 600 ml  Net -290 ml     Physical Exam  Awake Alert, Oriented X 2, frail, deconditioned Symmetrical Chest wall movement, Good air movement bilaterally, CTAB Irregular,No Gallops,Rubs or new Murmurs, No Parasternal Heave +ve B.Sounds, Abd Soft, No tenderness, No rebound - guarding or rigidity. No Cyanosis, Clubbing edema is improving, bilateral lower extremity edema present with some bruising.      RN pressure injury documentation: Pressure Injury 12/10/22 Pretibial Proximal;Right Stage 2 -  Partial thickness loss of dermis presenting as a shallow open injury with a red, pink wound bed without slough. (Active)  12/10/22 1610  Location: Pretibial  Location Orientation: Proximal;Right  Staging: Stage 2 -  Partial thickness loss of dermis presenting as a shallow open injury with a red, pink wound  bed without slough.  Wound Description (Comments):   Present on Admission: Yes  Dressing Type Foam - Lift dressing to assess site every shift 12/19/22 0800      Data Review:    Recent Labs  Lab 12/15/22 0341 12/16/22 0401 12/17/22 0323 12/18/22 0417 12/19/22 0837  WBC 9.1 10.2 9.6 8.6 6.6  HGB 9.9* 9.6* 9.9* 9.4* 9.5*  HCT 31.1* 30.2* 30.4* 29.1* 29.1*  PLT 162 180 174 172 172  MCV 94.8 95.3 95.6 96.4 97.0  MCH 30.2 30.3 31.1 31.1 31.7  MCHC 31.8 31.8 32.6 32.3 32.6  RDW 19.5* 18.9* 18.7* 18.6* 18.5*  LYMPHSABS  --  1.7 1.5 1.7 1.2  MONOABS  --  0.5 0.6 0.7 0.5  EOSABS  --  0.2 0.3 0.2 0.1  BASOSABS  --  0.0 0.0 0.0 0.0    Recent  Labs  Lab 12/15/22 0329 12/15/22 0341 12/15/22 0630 12/16/22 0401 12/17/22 0323 12/18/22 0417 12/19/22 0837  NA  --  137  --  139 136 135 133*  K  --  3.8  --  3.3* 3.2* 3.4* 4.1  CL  --  105  --  103 101 98 98  CO2  --  24  --  28 28 28 25   ANIONGAP  --  8  --  8 7 9 10   GLUCOSE  --  137*  --  113* 114* 107* 98  BUN  --  8  --  9 10 9 11   CREATININE  --  0.92  --  0.90 0.95 0.99 1.04  AST  --  14*  --   --   --   --   --   ALT  --  14  --   --   --   --   --   ALKPHOS  --  45  --   --   --   --   --   BILITOT  --  1.3*  --   --   --   --   --   ALBUMIN  --  2.5*  --   --   --   --   --   CRP  --   --  0.6 0.9 0.8  --   --   PROCALCITON <0.10  --   --  <0.10 <0.10  --   --   TSH 1.376  --   --   --   --  2.819  --   BNP 1,649.0*  --   --  1,571.1* 1,309.5* 878.0* 719.9*  MG  --  2.0  --  1.6* 1.8 2.0 1.7  CALCIUM  --  7.6*  --  7.9* 7.7* 7.9* 8.4*      Recent Labs  Lab 12/15/22 0329 12/15/22 0341 12/15/22 0630 12/16/22 0401 12/17/22 0323 12/18/22 0417 12/19/22 0837  CRP  --   --  0.6 0.9 0.8  --   --   PROCALCITON <0.10  --   --  <0.10 <0.10  --   --   TSH 1.376  --   --   --   --  2.819  --   BNP 1,649.0*  --   --  1,571.1* 1,309.5* 878.0* 719.9*  MG  --  2.0  --  1.6* 1.8 2.0 1.7  CALCIUM  --  7.6*  --  7.9* 7.7* 7.9* 8.4*    --------------------------------------------------------------------------------------------------------------- Lab Results  Component Value Date   CHOL 132 03/03/2021   HDL 42.50  03/03/2021   LDLCALC 65 03/03/2021   TRIG 124.0 03/03/2021   CHOLHDL 3 03/03/2021      Radiology Reports MR BRAIN WO CONTRAST  Result Date: 12/17/2022 CLINICAL DATA:  Altered mental status EXAM: MRI HEAD WITHOUT CONTRAST TECHNIQUE: Multiplanar, multiecho pulse sequences of the brain and surrounding structures were obtained without intravenous contrast. COMPARISON:  CT head 12/09/2022 FINDINGS: Brain: There is no acute intracranial hemorrhage, extra-axial  fluid collection, or acute infarct There is background parenchymal volume loss with prominence of the ventricular system and extra-axial CSF spaces. 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 There are additional small remote infarcts in the bilateral basal ganglia and left cerebellar hemisphere. Additional patchy FLAIR signal abnormality in the supratentorial white matter likely reflects sequela of underlying chronic small-vessel ischemic change. The pituitary and suprasellar region are normal. There is no mass lesion. There is no mass effect or midline shift. Vascular: Normal flow voids. Skull and upper cervical spine: Normal marrow signal. Sinuses/Orbits: The paranasal sinuses are clear. Bilateral lens implants are in place. The globes and orbits are otherwise unremarkable. Other: The mastoid air cells and middle ear cavities are clear. IMPRESSION: No acute intracranial pathology. Electronically Signed   By: Lesia Hausen M.D.   On: 12/17/2022 14:58      Signature  -   Mliss Fritz Charistopher Rumble M.D on 12/19/2022 at 1:19 PM   -  To page go to [email or web address]

## 2022-12-20 DIAGNOSIS — R6521 Severe sepsis with septic shock: Secondary | ICD-10-CM | POA: Diagnosis not present

## 2022-12-20 DIAGNOSIS — I5021 Acute systolic (congestive) heart failure: Secondary | ICD-10-CM | POA: Diagnosis not present

## 2022-12-20 DIAGNOSIS — A419 Sepsis, unspecified organism: Secondary | ICD-10-CM | POA: Diagnosis not present

## 2022-12-20 DIAGNOSIS — I4819 Other persistent atrial fibrillation: Secondary | ICD-10-CM | POA: Diagnosis not present

## 2022-12-20 LAB — CBC WITH DIFFERENTIAL/PLATELET
Abs Immature Granulocytes: 0.04 10*3/uL (ref 0.00–0.07)
Basophils Absolute: 0 10*3/uL (ref 0.0–0.1)
Basophils Relative: 0 %
Eosinophils Absolute: 0.1 10*3/uL (ref 0.0–0.5)
Eosinophils Relative: 2 %
HCT: 30.5 % — ABNORMAL LOW (ref 39.0–52.0)
Hemoglobin: 10 g/dL — ABNORMAL LOW (ref 13.0–17.0)
Immature Granulocytes: 1 %
Lymphocytes Relative: 22 %
Lymphs Abs: 1.4 10*3/uL (ref 0.7–4.0)
MCH: 31.2 pg (ref 26.0–34.0)
MCHC: 32.8 g/dL (ref 30.0–36.0)
MCV: 95 fL (ref 80.0–100.0)
Monocytes Absolute: 0.6 10*3/uL (ref 0.1–1.0)
Monocytes Relative: 9 %
Neutro Abs: 4.2 10*3/uL (ref 1.7–7.7)
Neutrophils Relative %: 66 %
Platelets: 132 10*3/uL — ABNORMAL LOW (ref 150–400)
RBC: 3.21 MIL/uL — ABNORMAL LOW (ref 4.22–5.81)
RDW: 18.4 % — ABNORMAL HIGH (ref 11.5–15.5)
WBC: 6.3 10*3/uL (ref 4.0–10.5)
nRBC: 0 % (ref 0.0–0.2)

## 2022-12-20 LAB — BASIC METABOLIC PANEL
Anion gap: 7 (ref 5–15)
BUN: 13 mg/dL (ref 8–23)
CO2: 28 mmol/L (ref 22–32)
Calcium: 8.1 mg/dL — ABNORMAL LOW (ref 8.9–10.3)
Chloride: 100 mmol/L (ref 98–111)
Creatinine, Ser: 1.2 mg/dL (ref 0.61–1.24)
GFR, Estimated: >60 mL/min (ref >60)
Glucose, Bld: 127 mg/dL — ABNORMAL HIGH (ref 70–99)
Potassium: 4 mmol/L (ref 3.5–5.1)
Sodium: 135 mmol/L (ref 135–145)

## 2022-12-20 MED ORDER — AMIODARONE HCL 200 MG PO TABS
200.0000 mg | ORAL_TABLET | Freq: Two times a day (BID) | ORAL | Status: DC
  Administered 2022-12-21: 200 mg via ORAL
  Filled 2022-12-20: qty 1

## 2022-12-20 MED ORDER — SENNOSIDES-DOCUSATE SODIUM 8.6-50 MG PO TABS
2.0000 | ORAL_TABLET | Freq: Two times a day (BID) | ORAL | Status: DC
  Administered 2022-12-20 – 2022-12-21 (×2): 2 via ORAL
  Filled 2022-12-20 (×3): qty 2

## 2022-12-20 MED ORDER — MAGNESIUM SULFATE IN D5W 1-5 GM/100ML-% IV SOLN
1.0000 g | Freq: Once | INTRAVENOUS | Status: AC
  Administered 2022-12-20: 1 g via INTRAVENOUS
  Filled 2022-12-20: qty 100

## 2022-12-20 MED ORDER — K PHOS MONO-SOD PHOS DI & MONO 155-852-130 MG PO TABS
500.0000 mg | ORAL_TABLET | Freq: Two times a day (BID) | ORAL | Status: AC
  Administered 2022-12-20: 500 mg via ORAL
  Filled 2022-12-20 (×2): qty 2

## 2022-12-20 NOTE — Plan of Care (Signed)
Patient's appetite increased and less confused today.

## 2022-12-20 NOTE — Progress Notes (Signed)
PROGRESS NOTE                                                                                                                                                                                                             Patient Demographics:    Bradley Oconnor, is a 82 y.o. male, DOB - 1940/04/23, GMW:[id number]  Outpatient Primary MD for the patient is [name], MD    LOS - 10  Admit date - 12/09/2022    Chief Complaint  Patient presents with   Altered Mental Status       Brief Narrative (HPI from H&P)    82 year old gentleman with history of L-spine stenosis, anemia of chronic disease, CAD, GERD, dyslipidemia who is not very active at home and has poor functional status presented on 12/09/2022 with confusion and weakness of 3 to 5-day duration, he was diagnosed with sepsis due to pneumonia, PE and DVT, admitted to ICU.  He was stabilized and transferred to [facility] on 12/15/2022.  He was noted to be in volume overload, acute systolic CHF with EF 35%, and A-fib with RVR as well, please see discussion below   Subjective:    Bradley Oconnor today no significant events overnight, he reports to be more appropriate and interactive, appetite has improved he drank his Ensure yesterday, he could eat some breakfast and lunch today.     Assessment  & Plan :   Septic Shock POA Bilateral Lower Lobe Pneumonia, POA  UTI, POA   -  has finished zosyn for 7 day course, sepsis pathophysiology has resolved, off of pressors, blood pressure improving on midodrine.  Continue to monitor with supportive care.  Acute systolic CHF  - EF 35% on echocardiogram with global hypokinesis.  - Currently symptom-free and compensated, underlying history of CAD.  Currently stable on Eliquis and statin, low-dose beta-blocker when blood pressure allows -carioldoy input greatly appreciated, IV diuresis, BNP trending down, blood  pressure started to become soft, he is transition to oral torsemide.  Persistent A-fib - he remains on oral amiodarone, plan to decrease amiodarone on Monday to 200 mg p.o. twice daily per cardiology recommendation on Eliquis for anticoagulation. 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   -  Minimize narcotics and benzodiazepines. -CT head with no acute finding, showing stable changes including brain atrophy and old stroke -RI brain has been obtained, showing signs of atrophy, old stroke, no acute findings -TSH within normal limit, B12 on the lower side -Will start on B12 supplement -Does appear patient with some underlying memory problem, he had rapid deterioration after his recent COVID infection and after his wife died 4 months ago, some possibly COVID encephalopathy past undiagnosed dementia, as well possibly due to some depression after his wife's death.  Starting Remeron to improve his appetite, as well might help with his depression  Failure to thrive Protein calorie malnutrition -Patient with very poor appetite, oral intake, pocketing his food, SLP consulted,, have discussed with nutritionist, he likes his supplements, so we will optimize his supplements to improve his calorie and protein intake, will add mirtazapine as well which should help with depression as well. Marland Kitchen  Appetite is improving.   Acute Pulmonary Emboli  Acute Right Lower Extremity DVT, common femoral vein   - on Eliquis now.  Hemodynamically stable.  Acute Kidney Injury  - due to sepsis resolved after supportive care.   Paroxysmal Atrial Fibrillation, Coronary Artery Disease  - no acute issues Eliquis and  statin for secondary prevention.  Beta-blocker once blood pressure improves.   Anemia of Critical Illness - Bleeding from IV sites  -  stable now, on Eliquis.   Moderate protein calorie malnutrition - dietitian following on oral diet.    Hypokalemia  hypomagnesemia -Repleted    L. Internal jugular C line discontinued 9/20.       Condition - Extremely Guarded  Family Communication  :   Discussed with his home caregiver, who is currently at bedside, discussed with brother by phone 9/20, discussed with brother at bedside today  Code Status :  Full  Consults  :  PCCM, Cards  PUD Prophylaxis :  PPI   Procedures  :     EEG - no acute seizures.  CTA  1. Multiple pulmonary emboli on the right. No evidence of right heart strain. 2. Filling defect in the common femoral vein on the right, compatible with DVT. 3. Strandy atelectasis or infiltrate at the lung bases. 4. Small bilateral pleural effusions. 5. Cardiomegaly with multi-vessel coronary artery calcifications. 6. Gallbladder hydrops with multiple layering stones in the gallbladder. 7. Mild gallbladder wall thickening, possible infectious or inflammatory cystitis. 8. Diverticulosis without diverticulitis. 9. 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 Recommend follow-up ultrasound every 2 years  Lower extremity venous duplex.  Right lower extremity acute DVT.   Abdominal ultrasound.  Gallstones, fatty liver.    TTE -  1. Left ventricular ejection fraction, by estimation, is 35 to 40%. The  left ventricle has moderately decreased function. The left ventricle  demonstrates global hypokinesis. Left ventricular diastolic parameters are  indeterminate.   2. Right ventricular systolic function is moderately reduced. The right  ventricular size is moderately enlarged. There is moderately elevated  pulmonary artery systolic pressure. The estimated right  ventricular  systolic pressure is 51.0 mmHg.   3. Left atrial size was severely dilated.   4. Right atrial size was mildly dilated.   5. The mitral valve is normal in structure. Moderate to severe mitral  valve regurgitation. No evidence of mitral stenosis.   6. The  aortic valve is tricuspid. There is mild calcification of the  aortic valve. Aortic valve regurgitation is mild to moderate. No aortic  stenosis is present. Aortic regurgitation PHT measures 469 msec.   7. Aortic dilatation noted. There is mild dilatation of the ascending  aorta, measuring 40 mm.   8. The inferior vena cava is dilated in size with <50% respiratory  variability, suggesting right atrial pressure of 15 mmHg.       Disposition Plan  :    Status is: Inpatient   DVT Prophylaxis  :     apixaban (ELIQUIS) tablet 10 mg  apixaban (ELIQUIS) tablet 5 mg     Lab Results  Component Value Date   PLT 172 12/19/2022    Diet :  Diet Order             DIET DYS 3 Room service appropriate? Yes with Assist; Fluid consistency: Thin  Diet effective now                    Inpatient Medications  Scheduled Meds:  [START ON 12/21/2022] amiodarone  200 mg Oral BID   apixaban  10 mg Oral BID   Followed by   Melene Muller ON 12/21/2022] apixaban  5 mg Oral BID   Chlorhexidine Gluconate Cloth  6 each Topical Once per day on Monday Tuesday Wednesday Thursday Friday Saturday   cyanocobalamin  1,000 mcg Subcutaneous Daily   Followed by   Melene Muller ON 12/25/2022] cyanocobalamin  1,000 mcg Subcutaneous Weekly   feeding supplement  237 mL Oral TID BM   folic acid  1 mg Oral Daily   levothyroxine  100 mcg Oral Q0600   mirtazapine  15 mg Oral QHS   multivitamin with minerals  1 tablet Oral Daily   pantoprazole  40 mg Oral Daily   phosphorus  500 mg Oral BID   ranolazine  500 mg Oral BID   rosuvastatin  20 mg Oral QPM   senna-docusate  2 tablet Oral BID   sodium chloride flush  10-40 mL Intracatheter Q12H   thiamine  100 mg  Oral Daily   torsemide  40 mg Oral Daily   Continuous Infusions:  sodium chloride Stopped (12/11/22 0913)   PRN Meds:.acetaminophen, docusate sodium, Gerhardt's butt cream, ondansetron (ZOFRAN) IV, mouth rinse, polyethylene glycol, sodium chloride flush     Objective:   Vitals:   12/20/22 0000 12/20/22 0500 12/20/22 0547 12/20/22 1156  BP: 104/64  106/61   Pulse: 70  66   Resp: 18  20   Temp: 98 F (36.7 C)  97.7 F (36.5 C)   TempSrc: Oral  Oral Oral  SpO2: 92%  100%   Weight:  67.5 kg    Height:        Wt Readings from Last 3 Encounters:  12/20/22 67.5 kg  08/11/21 81.5 kg  08/07/21 83 kg     Intake/Output Summary (Last 24 hours) at 12/20/2022 1319 Last data filed at 12/20/2022 0815 Gross per 24 hour  Intake 430 ml  Output 1250 ml  Net -820 ml     Physical Exam  Awake Alert, interactive, appears to be in a better mood today, extremely frail, deconditioned Symmetrical Chest wall movement, Good air movement bilaterally, CTAB Regular, no Gallops,Rubs or new Murmurs, No Parasternal Heave +ve B.Sounds, Abd Soft, No tenderness, No rebound - guarding or rigidity. No Cyanosis, Clubbing  bilateral lower extremity edema present with some bruising left upper extremity.  RN pressure injury documentation: Pressure Injury 12/10/22 Pretibial Proximal;Right Stage 2 -  Partial thickness loss of dermis presenting as a shallow open injury with a red, pink wound bed without slough. (Active)  12/10/22 8413  Location: Pretibial  Location Orientation: Proximal;Right  Staging: Stage 2 -  Partial thickness loss of dermis presenting as a shallow open injury with a red, pink wound bed without slough.  Wound Description (Comments):   Present on Admission: Yes  Dressing Type Foam - Lift dressing to assess site every shift 12/20/22 0815      Data Review:    Recent Labs  Lab 12/15/22 0341 12/16/22 0401 12/17/22 0323 12/18/22 0417 12/19/22 0837  WBC 9.1 10.2 9.6 8.6 6.6   HGB 9.9* 9.6* 9.9* 9.4* 9.5*  HCT 31.1* 30.2* 30.4* 29.1* 29.1*  PLT 162 180 174 172 172  MCV 94.8 95.3 95.6 96.4 97.0  MCH 30.2 30.3 31.1 31.1 31.7  MCHC 31.8 31.8 32.6 32.3 32.6  RDW 19.5* 18.9* 18.7* 18.6* 18.5*  LYMPHSABS  --  1.7 1.5 1.7 1.2  MONOABS  --  0.5 0.6 0.7 0.5  EOSABS  --  0.2 0.3 0.2 0.1  BASOSABS  --  0.0 0.0 0.0 0.0    Recent Labs  Lab 12/15/22 0329 12/15/22 0341 12/15/22 0630 12/16/22 0401 12/17/22 0323 12/18/22 0417 12/19/22 0837  NA  --  137  --  139 136 135 133*  K  --  3.8  --  3.3* 3.2* 3.4* 4.1  CL  --  105  --  103 101 98 98  CO2  --  24  --  28 28 28 25   ANIONGAP  --  8  --  8 7 9 10   GLUCOSE  --  137*  --  113* 114* 107* 98  BUN  --  8  --  9 10 9 11   CREATININE  --  0.92  --  0.90 0.95 0.99 1.04  AST  --  14*  --   --   --   --   --   ALT  --  14  --   --   --   --   --   ALKPHOS  --  45  --   --   --   --   --   BILITOT  --  1.3*  --   --   --   --   --   ALBUMIN  --  2.5*  --   --   --   --   --   CRP  --   --  0.6 0.9 0.8  --   --   PROCALCITON <0.10  --   --  <0.10 <0.10  --   --   TSH 1.376  --   --   --   --  2.819  --   BNP 1,649.0*  --   --  1,571.1* 1,309.5* 878.0* 719.9*  MG  --  2.0  --  1.6* 1.8 2.0 1.7  CALCIUM  --  7.6*  --  7.9* 7.7* 7.9* 8.4*      Recent Labs  Lab 12/15/22 0329 12/15/22 0341 12/15/22 0630 12/16/22 0401 12/17/22 0323 12/18/22 0417 12/19/22 0837  CRP  --   --  0.6 0.9 0.8  --   --   PROCALCITON <0.10  --   --  <0.10 <0.10  --   --   TSH 1.376  --   --   --   --  2.819  --   BNP 1,649.0*  --   --  1,571.1* 1,309.5* 878.0* 719.9*  MG  --  2.0  --  1.6* 1.8 2.0 1.7  CALCIUM  --  7.6*  --  7.9* 7.7* 7.9* 8.4*    --------------------------------------------------------------------------------------------------------------- Lab Results  Component Value Date   CHOL 132 03/03/2021   HDL 42.50 03/03/2021   LDLCALC 65 03/03/2021   TRIG 124.0 03/03/2021   CHOLHDL 3 03/03/2021      Radiology  Reports MR BRAIN WO CONTRAST  Result Date: 12/17/2022 CLINICAL DATA:  Altered mental status EXAM: MRI HEAD WITHOUT CONTRAST TECHNIQUE: Multiplanar, multiecho pulse sequences of the brain and surrounding structures were obtained without intravenous contrast. COMPARISON:  CT head 12/09/2022 FINDINGS: Brain: There is no acute intracranial hemorrhage, extra-axial fluid collection, or acute infarct There is background parenchymal volume loss with prominence of the ventricular system and extra-axial CSF spaces. 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 There are additional small remote infarcts in the bilateral basal ganglia and left cerebellar hemisphere. Additional patchy FLAIR signal abnormality in the supratentorial white matter likely reflects sequela of underlying chronic small-vessel ischemic change. The pituitary and suprasellar region are normal. There is no mass lesion. There is no mass effect or midline shift. Vascular: Normal flow voids. Skull and upper cervical spine: Normal marrow signal. Sinuses/Orbits: The paranasal sinuses are clear. Bilateral lens implants are in place. The globes and orbits are otherwise unremarkable. Other: The mastoid air cells and middle ear cavities are clear. IMPRESSION: No acute intracranial pathology. Electronically Signed   By: Lesia Hausen M.D.   On: 12/17/2022 14:58      Signature  -   Mliss Fritz Ahaana Rochette M.D on 12/20/2022 at 1:19 PM   -  To page go to [email or web address]

## 2022-12-20 NOTE — Plan of Care (Signed)
Problem: Education: Goal: Knowledge of General Education information will improve Description: Including pain rating scale, medication(s)/side effects and non-pharmacologic comfort measures Outcome: Progressing   Problem: Clinical Measurements: Goal: Ability to maintain clinical measurements within normal limits will improve Outcome: Progressing Goal: Will remain free from infection Outcome: Progressing   Problem: Nutrition: Goal: Adequate nutrition will be maintained Outcome: Progressing   Problem: Safety: Goal: Ability to remain free from injury will improve Outcome: Progressing   Problem: Skin Integrity: Goal: Risk for impaired skin integrity will decrease Outcome: Progressing

## 2022-12-20 NOTE — Progress Notes (Signed)
DAILY PROGRESS NOTE   Patient Name: Bradley Oconnor Date of Encounter: 12/20/2022 Cardiologist: Nicki Guadalajara, MD  Chief Complaint   No issues overnight  Patient Profile   Bradley Oconnor is a 82 y.o. male with a hx of CAD status post CABG, hyperlipidemia, hypertension, GERD, anemia who is being seen 12/15/2022 for the evaluation of new onset HFrEF at the request of Dr. Thedore Mins.   Subjective   Noted to be back in sinus rhythm today. Negative 620 cc overnight. No labs today.  Objective   Vitals:   12/19/22 2100 12/20/22 0000 12/20/22 0500 12/20/22 0547  BP: 110/70 104/64  106/61  Pulse: 73 70  66  Resp: 20 18  20   Temp: 98 F (36.7 C) 98 F (36.7 C)  97.7 F (36.5 C)  TempSrc: Oral Oral  Oral  SpO2: 97% 92%  100%  Weight:   67.5 kg   Height:        Intake/Output Summary (Last 24 hours) at 12/20/2022 1035 Last data filed at 12/19/2022 2154 Gross per 24 hour  Intake 330 ml  Output 1250 ml  Net -920 ml   Filed Weights   12/17/22 0500 12/19/22 0500 12/20/22 0500  Weight: 72.2 kg 68.3 kg 67.5 kg    Physical Exam   General appearance: alert, no distress, and pale Neck: no carotid bruit, no JVD, and thyroid not enlarged, symmetric, no tenderness/mass/nodules Lungs: clear to auscultation bilaterally Heart: irregularly irregular rhythm Abdomen: soft, non-tender; bowel sounds normal; no masses,  no organomegaly Extremities: edema trace Pulses: 2+ and symmetric Skin: pale, cool. dry Neurologic: Mental status: awake, but confused Psych: pleasant  Inpatient Medications    Scheduled Meds:  amiodarone  400 mg Oral BID   apixaban  10 mg Oral BID   Followed by   Melene Muller ON 12/21/2022] apixaban  5 mg Oral BID   Chlorhexidine Gluconate Cloth  6 each Topical Once per day on Monday Tuesday Wednesday Thursday Friday Saturday   cyanocobalamin  1,000 mcg Subcutaneous Daily   Followed by   Melene Muller ON 12/25/2022] cyanocobalamin  1,000 mcg Subcutaneous Weekly   feeding  supplement  237 mL Oral TID BM   folic acid  1 mg Oral Daily   levothyroxine  100 mcg Oral Q0600   mirtazapine  15 mg Oral QHS   multivitamin with minerals  1 tablet Oral Daily   pantoprazole  40 mg Oral Daily   ranolazine  500 mg Oral BID   rosuvastatin  20 mg Oral QPM   sodium chloride flush  10-40 mL Intracatheter Q12H   thiamine  100 mg Oral Daily   torsemide  40 mg Oral Daily    Continuous Infusions:  sodium chloride Stopped (12/11/22 0913)    PRN Meds: acetaminophen, docusate sodium, Gerhardt's butt cream, ondansetron (ZOFRAN) IV, mouth rinse, polyethylene glycol, sodium chloride flush   Labs   Results for orders placed or performed during the hospital encounter of 12/09/22 (from the past 48 hour(s))  Magnesium     Status: None   Collection Time: 12/19/22  8:37 AM  Result Value Ref Range   Magnesium 1.7 1.7 - 2.4 mg/dL    Comment: Performed at [facility], [address], [place], [place] [address]  CBC with Differential/Platelet     Status: Abnormal   Collection Time: 12/19/22  8:37 AM  Result Value Ref Range   WBC 6.6 4.0 - 10.5 K/uL   RBC 3.00 (L) 4.22 - 5.81 MIL/uL  Hemoglobin 9.5 (L) 13.0 - 17.0 g/dL   HCT 78.2 (L) 95.6 - 21.3 %   MCV 97.0 80.0 - 100.0 fL   MCH 31.7 26.0 - 34.0 pg   MCHC 32.6 30.0 - 36.0 g/dL   RDW 08.6 (H) 57.8 - 46.9 %   Platelets 172 150 - 400 K/uL   nRBC 0.0 0.0 - 0.2 %   Neutrophils Relative % 71 %   Neutro Abs 4.7 1.7 - 7.7 K/uL   Lymphocytes Relative 18 %   Lymphs Abs 1.2 0.7 - 4.0 K/uL   Monocytes Relative 8 %   Monocytes Absolute 0.5 0.1 - 1.0 K/uL   Eosinophils Relative 2 %   Eosinophils Absolute 0.1 0.0 - 0.5 K/uL   Basophils Relative 0 %   Basophils Absolute 0.0 0.0 - 0.1 K/uL   Immature Granulocytes 1 %   Abs Immature Granulocytes 0.04 0.00 - 0.07 K/uL    Comment: Performed at [facility], [address], [place], [place] [address]  Brain natriuretic peptide     Status: Abnormal   Collection Time: 12/19/22   8:37 AM  Result Value Ref Range   B Natriuretic Peptide 719.9 (H) 0.0 - 100.0 pg/mL    Comment: Performed at [facility], [address] [place], [place] [address]  Basic metabolic panel     Status: Abnormal   Collection Time: 12/19/22  8:37 AM  Result Value Ref Range   Sodium 133 (L) 135 - 145 mmol/L   Potassium 4.1 3.5 - 5.1 mmol/L   Chloride 98 98 - 111 mmol/L   CO2 25 22 - 32 mmol/L   Glucose, Bld 98 70 - 99 mg/dL    Comment: Glucose reference range applies only to samples taken after fasting for at least 8 hours.   BUN 11 8 - 23 mg/dL   Creatinine, Ser 4.40 0.61 - 1.24 mg/dL   Calcium 8.4 (L) 8.9 - 10.3 mg/dL   GFR, Estimated >10 >27 mL/min    Comment: (NOTE) Calculated using the CKD-EPI Creatinine Equation (2021)    Anion gap 10 5 - 15    Comment: Performed at [facility], [address], [place], [place] [address]    ECG   Afib with QTc ~500 msec - Personally Reviewed  Telemetry   Afib with PVC's - Personally Reviewed  Radiology    No results found.  Cardiac Studies   N/A  Assessment   Principal Problem:   Septic shock (HCC) Active Problems:   Persistent atrial fibrillation (HCC)   Acute systolic (congestive) heart failure (HCC)   Pressure injury of skin   Malnutrition of moderate degree   Plan   Back in sinus rhythm - will reduce amiodarone to 200 mg BID starting tomorrow. Starting oral torsemide 40 mg daily today. Overall improved.  Time Spent Directly with Patient:  I have spent a total of 25 minutes with the patient reviewing hospital notes, telemetry, EKGs, labs and examining the patient as well as establishing an assessment and plan that was discussed personally with the patient.  > 50% of time was spent in direct patient care.  Length of Stay:  LOS: 10 days   Chrystie Nose, MD, [facility], FACP  [place]  [facility]  Medical Director of the [facility] Lipid [facility] &  Cardiovascular Risk Reduction Clinic Diplomate of  the American Board of Clinical Lipidology Attending Cardiologist  Direct Dial: [phone]  Fax: [phone]  Website:  [email or web address]  Iantha Fallen  C Zae Kirtz 12/20/2022, 10:35 AM

## 2022-12-20 NOTE — Progress Notes (Signed)
Patient refused all his night meds. Made MD aware.

## 2022-12-21 ENCOUNTER — Other Ambulatory Visit (HOSPITAL_COMMUNITY): Payer: Self-pay

## 2022-12-21 DIAGNOSIS — E440 Moderate protein-calorie malnutrition: Secondary | ICD-10-CM

## 2022-12-21 DIAGNOSIS — A419 Sepsis, unspecified organism: Secondary | ICD-10-CM | POA: Diagnosis not present

## 2022-12-21 DIAGNOSIS — I5041 Acute combined systolic (congestive) and diastolic (congestive) heart failure: Secondary | ICD-10-CM

## 2022-12-21 DIAGNOSIS — R9431 Abnormal electrocardiogram [ECG] [EKG]: Secondary | ICD-10-CM

## 2022-12-21 DIAGNOSIS — I4819 Other persistent atrial fibrillation: Secondary | ICD-10-CM | POA: Diagnosis not present

## 2022-12-21 DIAGNOSIS — R6521 Severe sepsis with septic shock: Secondary | ICD-10-CM | POA: Diagnosis not present

## 2022-12-21 LAB — PHOSPHORUS: Phosphorus: 3.9 mg/dL (ref 2.5–4.6)

## 2022-12-21 LAB — BASIC METABOLIC PANEL
Anion gap: 11 (ref 5–15)
BUN: 15 mg/dL (ref 8–23)
CO2: 24 mmol/L (ref 22–32)
Calcium: 8.1 mg/dL — ABNORMAL LOW (ref 8.9–10.3)
Chloride: 100 mmol/L (ref 98–111)
Creatinine, Ser: 1.17 mg/dL (ref 0.61–1.24)
GFR, Estimated: >60 mL/min (ref >60)
Glucose, Bld: 91 mg/dL (ref 70–99)
Potassium: 4.4 mmol/L (ref 3.5–5.1)
Sodium: 135 mmol/L (ref 135–145)

## 2022-12-21 LAB — MAGNESIUM: Magnesium: 1.9 mg/dL (ref 1.7–2.4)

## 2022-12-21 MED ORDER — MIRTAZAPINE 15 MG PO TBDP
15.0000 mg | ORAL_TABLET | Freq: Every day | ORAL | 0 refills | Status: DC
  Filled 2022-12-21: qty 30, 30d supply, fill #0

## 2022-12-21 MED ORDER — MAGIC MOUTHWASH W/LIDOCAINE
5.0000 mL | Freq: Four times a day (QID) | ORAL | Status: DC
  Filled 2022-12-21 (×2): qty 5

## 2022-12-21 MED ORDER — AMIODARONE HCL 200 MG PO TABS
200.0000 mg | ORAL_TABLET | Freq: Every day | ORAL | 0 refills | Status: DC
  Filled 2022-12-21: qty 30, 30d supply, fill #0

## 2022-12-21 MED ORDER — VITAMIN B-12 1000 MCG PO TABS
1000.0000 ug | ORAL_TABLET | Freq: Every day | ORAL | 0 refills | Status: DC
  Filled 2022-12-21: qty 90, 90d supply, fill #0

## 2022-12-21 MED ORDER — APIXABAN 5 MG PO TABS
5.0000 mg | ORAL_TABLET | Freq: Two times a day (BID) | ORAL | 0 refills | Status: DC
Start: 2022-12-21 — End: 2023-01-18
  Filled 2022-12-21: qty 60, 30d supply, fill #0

## 2022-12-21 MED ORDER — AMIODARONE HCL 200 MG PO TABS: 200.0000 mg | ORAL_TABLET | Freq: Every day | ORAL | Status: DC

## 2022-12-21 MED ORDER — TORSEMIDE 20 MG PO TABS: 20.0000 mg | ORAL_TABLET | Freq: Every day | ORAL | Status: DC

## 2022-12-21 MED ORDER — TORSEMIDE 20 MG PO TABS
20.0000 mg | ORAL_TABLET | Freq: Every day | ORAL | 0 refills | Status: DC
  Filled 2022-12-21: qty 30, 30d supply, fill #0

## 2022-12-21 MED ORDER — MAGIC MOUTHWASH W/LIDOCAINE
5.0000 mL | Freq: Four times a day (QID) | ORAL | 0 refills | Status: DC
  Filled 2022-12-21: qty 300, 15d supply, fill #0

## 2022-12-21 MED ORDER — FOLIC ACID 1 MG PO TABS: 1.0000 mg | ORAL_TABLET | Freq: Every day | ORAL | Status: DC

## 2022-12-21 MED ORDER — ACETAMINOPHEN 325 MG PO TABS: 650.0000 mg | ORAL_TABLET | Freq: Four times a day (QID) | ORAL | Status: DC | PRN

## 2022-12-21 NOTE — Plan of Care (Signed)
  Problem: Education: Goal: Knowledge of General Education information will improve Description: Including pain rating scale, medication(s)/side effects and non-pharmacologic comfort measures Outcome: Progressing   Problem: Clinical Measurements: Goal: Ability to maintain clinical measurements within normal limits will improve Outcome: Progressing Goal: Will remain free from infection Outcome: Progressing   Problem: Pain Managment: Goal: General experience of comfort will improve Outcome: Progressing   Problem: Skin Integrity: Goal: Risk for impaired skin integrity will decrease Outcome: Progressing

## 2022-12-21 NOTE — Progress Notes (Addendum)
Rounding Note    Patient Name: Bradley Oconnor Date of Encounter: 12/21/2022  [facility] Cardiologist: Nicki Guadalajara, MD   Subjective   Net negative 470cc yesterday.  Labs pending.  BP 94/59.  Denies any chest pain or dyspnea.  Inpatient Medications    Scheduled Meds:  amiodarone  200 mg Oral BID   apixaban  5 mg Oral BID   Chlorhexidine Gluconate Cloth  6 each Topical Once per day on Monday Tuesday Wednesday Thursday Friday Saturday   cyanocobalamin  1,000 mcg Subcutaneous Daily   Followed by   Melene Muller ON 12/25/2022] cyanocobalamin  1,000 mcg Subcutaneous Weekly   feeding supplement  237 mL Oral TID BM   folic acid  1 mg Oral Daily   levothyroxine  100 mcg Oral Q0600   mirtazapine  15 mg Oral QHS   multivitamin with minerals  1 tablet Oral Daily   pantoprazole  40 mg Oral Daily   ranolazine  500 mg Oral BID   rosuvastatin  20 mg Oral QPM   senna-docusate  2 tablet Oral BID   sodium chloride flush  10-40 mL Intracatheter Q12H   thiamine  100 mg Oral Daily   torsemide  40 mg Oral Daily   Continuous Infusions:  sodium chloride Stopped (12/11/22 0913)   PRN Meds: acetaminophen, docusate sodium, Gerhardt's butt cream, ondansetron (ZOFRAN) IV, mouth rinse, polyethylene glycol, sodium chloride flush   Vital Signs    Vitals:   12/21/22 0035 12/21/22 0200 12/21/22 0400 12/21/22 0702  BP: 119/61  114/63 (!) 94/59  Pulse: 71  66 70  Resp: 14 (!) 21 14 18   Temp: 98.3 F (36.8 C)  98.1 F (36.7 C) (!) 97.3 F (36.3 C)  TempSrc: Oral  Oral   SpO2: 92%  91% 94%  Weight:   67.3 kg   Height:        Intake/Output Summary (Last 24 hours) at 12/21/2022 1121 Last data filed at 12/21/2022 5409 Gross per 24 hour  Intake 410 ml  Output 1300 ml  Net -890 ml      12/21/2022    4:00 AM 12/20/2022    5:00 AM 12/19/2022    5:00 AM  Last 3 Weights  Weight (lbs) 148 lb 5.9 oz 148 lb 13 oz 150 lb 9.2 oz  Weight (kg) 67.3 kg 67.5 kg 68.3 kg      Telemetry    NSR -  Personally Reviewed  ECG    NSR, LVH, Qtc 519 - Personally Reviewed  Physical Exam   GEN: No acute distress.   Neck: No JVD Cardiac: RRR, no murmurs, rubs, or gallops.  Respiratory: Clear to auscultation bilaterally. GI: Soft, nontender, non-distended  MS: No edema; No deformity. Neuro:  Nonfocal  Psych: Normal affect   Labs    High Sensitivity Troponin:   Recent Labs  Lab 12/09/22 2331 12/10/22 0300 12/11/22 0220  TROPONINIHS 67* 62* 117*     Chemistry Recent Labs  Lab 12/15/22 0341 12/16/22 0401 12/17/22 0323 12/18/22 0417 12/19/22 0837 12/20/22 1626  NA 137   < > 136 135 133* 135  K 3.8   < > 3.2* 3.4* 4.1 4.0  CL 105   < > 101 98 98 100  CO2 24   < > 28 28 25 28   GLUCOSE 137*   < > 114* 107* 98 127*  BUN 8   < > 10 9 11 13   CREATININE 0.92   < > 0.95 0.99 1.04 1.20  CALCIUM 7.6*   < > 7.7* 7.9* 8.4* 8.1*  MG 2.0   < > 1.8 2.0 1.7  --   PROT 4.7*  --   --   --   --   --   ALBUMIN 2.5*  --   --   --   --   --   AST 14*  --   --   --   --   --   ALT 14  --   --   --   --   --   ALKPHOS 45  --   --   --   --   --   BILITOT 1.3*  --   --   --   --   --   GFRNONAA >60   < > >60 >60 >60 >60  ANIONGAP 8   < > 7 9 10 7    < > = values in this interval not displayed.    Lipids No results for input(s): "CHOL", "TRIG", "HDL", "LABVLDL", "LDLCALC", "CHOLHDL" in the last 168 hours.  Hematology Recent Labs  Lab 12/18/22 0417 12/19/22 0837 12/20/22 1626  WBC 8.6 6.6 6.3  RBC 3.02* 3.00* 3.21*  HGB 9.4* 9.5* 10.0*  HCT 29.1* 29.1* 30.5*  MCV 96.4 97.0 95.0  MCH 31.1 31.7 31.2  MCHC 32.3 32.6 32.8  RDW 18.6* 18.5* 18.4*  PLT 172 172 132*   Thyroid  Recent Labs  Lab 12/18/22 0417  TSH 2.819    BNP Recent Labs  Lab 12/17/22 0323 12/18/22 0417 12/19/22 0837  BNP 1,309.5* 878.0* 719.9*    DDimer No results for input(s): "DDIMER" in the last 168 hours.   Radiology    No results found.  Cardiac Studies     Patient Profile     82 y.o. male  with a history of CAD status post CABG, hypertension, anemia who presented with sepsis due to pneumonia and PE/DVT.  He was found to have acute systolic heart failure, with EF 35%, and A-fib with RVR  Assessment & Plan    Acute combined heart failure: Echocardiogram this admission shows EF 35-40%.  He has been diuresed, now switched to p.o. torsemide.  Can add GDMT as BP tolerates, unable to add at this point due to low BP. Could be stress cardiomyopathy in setting of sepsis or due to A-fib.  If does not improve with recovery of his acute illness and maintenance of sinus rhythm, would consider ischemic evaluation as outpatient.  Atrial fibrillation with RVR: Continue Eliquis.  Started on amiodarone, now in sinus rhythm.  QT prolonged (Qtc 519 today), will reduced amio dose to 200 mg daily, will need to monitor as outpatient.  Would plan to continue amio for next month as he recovers from acute illness, can then potentially stop medication  Septic shock: Due to pneumonia, completed course of Zosyn.  Off pressors.  Was requiring midodrine, but has been weaned off.  Acute PE/DVT: On Eliquis  [facility] will sign off.   Medication Recommendations:  torsemide 20 mg daily, amiodarone 200 mg daily, Eliquis 5 mg BID Other recommendations (labs, testing, etc):  BMET/magnesium in 1 week Follow up as an outpatient:  Will schedule in Afib clinic in 1-2 weeks and with Dr Tresa Endo or APP in general cardiology in 1 month   For questions or updates, please contact  [facility] Please consult [email or web address] for contact info under        Signed, Little Ishikawa, MD  12/21/2022,  11:21 AM

## 2022-12-21 NOTE — TOC Transition Note (Signed)
Transition of Care [facility]) - CM/SW Discharge Note   Patient Details  Name: Bradley Oconnor MRN: [id number] Date of Birth: Dec 03, 1940  [facility] CM/SW Contact:  Gordy Clement, RN Phone Number: 12/21/2022, 1:20 PM   Clinical Narrative:     Patient will DC to home today. [facility] ( [facility]) will be providing Home Health. Patient also has 24/7 caregivers including a friend and neighbor, Bradley Oconnor.  Brother will continue coming on weekends. No equipment is needed- everything already in the home.  [facility] will transport  Caregiver will be at the home            Patient Goals and CMS Choice      Discharge Placement                         Discharge Plan and Services Additional resources added to the After Visit Summary for                                       Social Determinants of Health (SDOH) Interventions SDOH Screenings   Food Insecurity: No Food Insecurity (12/15/2022)  Housing: Low Risk  (12/15/2022)  Transportation Needs: No Transportation Needs (12/15/2022)  Utilities: Not At Risk (12/15/2022)  Alcohol Screen: Low Risk  (07/16/2022)  Depression (PHQ2-9): Low Risk  (07/16/2022)  Financial Resource Strain: Low Risk  (07/16/2022)  Physical Activity: Insufficiently Active (07/16/2022)  Social Connections: Moderately Isolated (07/16/2022)  Stress: No Stress Concern Present (07/16/2022)  Tobacco Use: Low Risk  (12/09/2022)     Readmission Risk Interventions     No data to display

## 2022-12-21 NOTE — Discharge Summary (Signed)
Physician Discharge Summary  Bradley Oconnor XBM:[id number] DOB: 02/23/1941 DOA: 12/09/2022  PCP: [name], MD  Admit date: 12/09/2022 Discharge date: 12/21/2022  Admitted From: (Home) Disposition:  (Home )  Recommendations for Outpatient Follow-up:  Follow up with PCP in 1-2 weeks Please obtain BMP/CBC in one week Patient to follow-up with cardiology as an outpatient Keep encouraging patient to drink supplements Please check B12 level after 4 weeks of oral replacement.  And if remains low, then we will need to transition to IM supplements  Home Health: (YES)  Diet recommendation: Regular diet  Brief/Interim Summary:  82 year old gentleman with history of L-spine stenosis, anemia of chronic disease, CAD, GERD, dyslipidemia who is not very active at home and has poor functional status presented on 12/09/2022 with confusion and weakness of 3 to 5-day duration, he was diagnosed with sepsis due to pneumonia, PE and DVT, admitted to ICU.  He was stabilized and transferred to [facility] on 12/15/2022.  He was noted to be in volume overload, acute systolic CHF with EF 35%, and A-fib with RVR as well, please see discussion below   Septic Shock POA Bilateral Lower Lobe Pneumonia, POA  UTI, POA   -  has finished zosyn for 7 day course, sepsis pathophysiology has resolved, off of pressors, blood pressure improving on midodrine.  No further antibiotics needed at time of discharge.   Acute combined diastolic-systolic CHF  - EF 35% on echocardiogram with global hypokinesis.  Cardiology input greatly appreciated, no significant evidence of volume overload, required aggressive IV diuresis, volume status much improved, he is being discharged on torsemide 20 mg oral daily.    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   -  Minimize narcotics and benzodiazepines. -CT head with no acute finding, showing stable changes including brain atrophy and old stroke -RI brain has been obtained, showing signs of atrophy, old stroke, no acute findings -TSH within normal limit, B12 on the lower side - started on B12 supplement -Does appear patient with some underlying memory problem, he had rapid deterioration after his recent COVID infection and after his wife died 4 months ago, some possibly COVID encephalopathy past undiagnosed dementia, as well possibly due to some depression after his wife's death.  Have increased Remeron dose, which worked well by improving his appetite, and he appears to be in better mood, having good night sleep.    Failure to thrive Protein calorie malnutrition -Patient with very poor appetite, oral intake, pocketing his food, SLP consulted,, have discussed with nutritionist, he likes his supplements, so we will optimize his supplements to improve his calorie and protein intake, increase his dose of mirtazapine as well which should help with depression as well. Marland Kitchen  Appetite is improving.   Acute Pulmonary Emboli  Acute Right Lower Extremity DVT, common femoral vein   - on Eliquis now.  Hemodynamically stable.   Acute Kidney Injury  -  due to sepsis resolved after supportive care.   Paroxysmal Atrial Fibrillation, Coronary Artery Disease  - no acute issues Eliquis and  statin for secondary prevention.  Beta-blocker once blood pressure improves.   Anemia of Critical Illness - Bleeding from IV sites  -  stable now, on Eliquis.   Moderate protein calorie malnutrition - dietitian following on oral diet.    Hypokalemia  hypomagnesemia -Repleted        Discharge Diagnoses:  Principal Problem:   Septic shock (HCC) Active Problems:   Persistent atrial fibrillation (HCC)   Acute combined systolic and diastolic heart failure (HCC)   Pressure injury of skin   Malnutrition of moderate degree   Prolonged QT interval    Discharge Instructions  Discharge Instructions     Diet - low sodium heart healthy   Complete by: As directed    Discharge instructions   Complete by: As directed    Follow with Primary MD [name], MD in 7 days   Get CBC, CMP,  checked  by Primary MD next visit.    Activity: As tolerated with Full fall precautions use walker/cane & assistance as needed   Disposition Home    Diet: regular diet  For Heart failure patients - Check your Weight same time everyday, if you gain over 2 pounds, or you develop in leg swelling, experience more shortness of breath or chest pain, call your Primary MD immediately. Follow Cardiac Low Salt Diet and 1.5 lit/day fluid restriction.   On your next visit with your primary care physician please Get Medicines reviewed and adjusted.   Please request your Prim.MD to go over all Hospital Tests and Procedure/Radiological results at the follow up, please get all Hospital records sent to your Prim MD by signing hospital release before you go home.   If you experience worsening of your admission symptoms, develop shortness of breath, life threatening emergency, suicidal or homicidal thoughts you must seek medical attention immediately by calling 911 or calling your MD immediately  if symptoms less severe.  You Must read complete instructions/literature along with all the possible adverse  reactions/side effects for all the Medicines you take and that have been prescribed to you. Take any new Medicines after you have completely understood and accpet all the possible adverse reactions/side effects.   Do not drive, operating heavy machinery, perform activities at heights, swimming or participation in water activities or provide baby sitting services if your were admitted for syncope or siezures until you have seen by Primary MD or a Neurologist and advised to do so again.  Do not drive when taking Pain medications.    Do not take more than prescribed Pain, Sleep and Anxiety Medications  Special Instructions: If you have smoked or chewed Tobacco  in the last 2 yrs please stop smoking, stop any regular Alcohol  and or any Recreational drug use.  Wear Seat belts while driving.   Please note  You were cared for by a hospitalist during your hospital stay. If you have any questions about your discharge medications or the care you received while you were in the hospital after you are discharged, you can call the unit and asked to speak with the hospitalist on call if the hospitalist that took care of you is not available. Once you are discharged, your primary care physician will handle any further medical issues. Please note that NO REFILLS for any discharge medications will be authorized once you are discharged, as it is imperative  that you return to your primary care physician (or establish a relationship with a primary care physician if you do not have one) for your aftercare needs so that they can reassess your need for medications and monitor your lab values.   Increase activity slowly   Complete by: As directed    No wound care   Complete by: As directed       Allergies as of 12/21/2022       Reactions   Procardia [nifedipine] Other (See Comments)   Lowers bp   Phenergan [promethazine Hcl] Nausea And Vomiting        Medication List     STOP taking these medications     amLODipine 5 MG tablet Commonly known as: NORVASC   amoxicillin-clavulanate 875-125 MG tablet Commonly known as: AUGMENTIN   aspirin EC 81 MG tablet   ciprofloxacin 500 MG tablet Commonly known as: CIPRO   escitalopram 5 MG tablet Commonly known as: LEXAPRO   hydrochlorothiazide 12.5 MG capsule Commonly known as: MICROZIDE   isosorbide mononitrate 120 MG 24 hr tablet Commonly known as: IMDUR   metoprolol succinate 100 MG 24 hr tablet Commonly known as: TOPROL-XL   metoprolol succinate 25 MG 24 hr tablet Commonly known as: TOPROL-XL   mirtazapine 7.5 MG tablet Commonly known as: REMERON Replaced by: mirtazapine 15 MG disintegrating tablet   nitroGLYCERIN 0.4 MG SL tablet Commonly known as: NITROSTAT   QUEtiapine 25 MG tablet Commonly known as: SEROQUEL       TAKE these medications    acetaminophen 325 MG tablet Commonly known as: TYLENOL Take 2 tablets (650 mg total) by mouth every 6 (six) hours as needed for mild pain, fever or headache.   amiodarone 200 MG tablet Commonly known as: PACERONE Take 1 tablet (200 mg total) by mouth daily. Start taking on: December 22, 2022   cyanocobalamin 1000 MCG tablet Commonly known as: VITAMIN B12 Take 1 tablet (1,000 mcg total) by mouth daily.   Eliquis 5 MG Tabs tablet Generic drug: apixaban Take 1 tablet (5 mg total) by mouth 2 (two) times daily.   folic acid 1 MG tablet Commonly known as: FOLVITE Take 1 tablet (1 mg total) by mouth daily. Start taking on: December 22, 2022   levothyroxine 100 MCG tablet Commonly known as: SYNTHROID TAKE 1 TABLET BY MOUTH ONCE DAILY IN THE MORNING -  NEED  OFFICE  VISIT  WITH  DR.  Tresa Endo  FOR  FUTURE  REFILLS   mirtazapine 15 MG disintegrating tablet Commonly known as: REMERON SOL-TAB Take 1 tablet (15 mg total) by mouth at bedtime. Replaces: mirtazapine 7.5 MG tablet   omeprazole 20 MG capsule Commonly known as: PRILOSEC Take 20 mg by mouth daily.   ondansetron 4  MG tablet Commonly known as: ZOFRAN Take 4 mg by mouth every 8 (eight) hours as needed for nausea or vomiting.   potassium chloride SA 20 MEQ tablet Commonly known as: KLOR-CON M Take 20 mEq by mouth daily.   ranolazine 1000 MG SR tablet Commonly known as: RANEXA TAKE 1  BY MOUTH TWICE DAILY   rosuvastatin 20 MG tablet Commonly known as: CRESTOR TAKE 1 TABLET BY MOUTH ONCE DAILY IN THE EVENING   torsemide 20 MG tablet Commonly known as: DEMADEX Take 1 tablet (20 mg total) by mouth daily. Start taking on: December 22, 2022        Follow-up Information     [name], MD Follow up.   Specialties: Family Medicine,  Sports Medicine Contact information: [address] [place] [address] [phone]                Allergies  Allergen Reactions   Procardia [Nifedipine] Other (See Comments)    Lowers bp    Phenergan [Promethazine Hcl] Nausea And Vomiting    Consultations: [facility] Cardiology   Procedures/Studies: MR BRAIN WO CONTRAST  Result Date: 12/17/2022 CLINICAL DATA:  Altered mental status EXAM: MRI HEAD WITHOUT CONTRAST TECHNIQUE: Multiplanar, multiecho pulse sequences of the brain and surrounding structures were obtained without intravenous contrast. COMPARISON:  CT head 12/09/2022 FINDINGS: Brain: There is no acute intracranial hemorrhage, extra-axial fluid collection, or acute infarct There is background parenchymal volume loss with prominence of the ventricular system and extra-axial CSF spaces. 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 There are additional small remote infarcts in the bilateral basal ganglia and left cerebellar hemisphere. Additional patchy FLAIR signal abnormality in the supratentorial white matter likely reflects sequela of underlying chronic small-vessel ischemic change. The pituitary and suprasellar region are normal. There is no mass lesion. There is no mass effect or midline shift. Vascular: Normal flow  voids. Skull and upper cervical spine: Normal marrow signal. Sinuses/Orbits: The paranasal sinuses are clear. Bilateral lens implants are in place. The globes and orbits are otherwise unremarkable. Other: The mastoid air cells and middle ear cavities are clear. IMPRESSION: No acute intracranial pathology. Electronically Signed   By: Lesia Hausen M.D.   On: 12/17/2022 14:58   DG Chest Port 1 View  Result Date: 12/15/2022 CLINICAL DATA:  Shortness of breath. EXAM: PORTABLE CHEST 1 VIEW COMPARISON:  12/10/2018 FINDINGS: Mild cardiac enlargement with evidence of prior cardiac surgery. Low bilateral lung volumes with bibasilar atelectasis and small to moderate bilateral pleural effusions. No overt pulmonary edema. No pneumothorax. IMPRESSION: Low lung volumes with bibasilar atelectasis and small to moderate bilateral pleural effusions. Electronically Signed   By: Irish Lack M.D.   On: 12/15/2022 08:03   VAS [place] LOWER EXTREMITY VENOUS (DVT)  Result Date: 12/10/2022  Lower Venous DVT Study Patient Name:  LINN LIECHTY  Date of Exam:   12/10/2022 Medical Rec #: [id number]         Accession #:    [id number] Date of Birth: 1941/01/25         Patient Gender: M Patient Age:   22 years Exam Location:  [facility] Procedure:      VAS [place] LOWER EXTREMITY VENOUS (DVT) Referring Phys: Pia Mau --------------------------------------------------------------------------------  Indications: Pulmonary embolism. Other Indications: Filling defect in right common femoral vein by CT. Limitations: Patient shivering with chills. Comparison Study: No prior study on file Performing Technologist: Sherren Kerns RVS  Examination Guidelines: A complete evaluation includes B-mode imaging, spectral Doppler, color Doppler, and power Doppler as needed of all accessible portions of each vessel. Bilateral testing is considered an integral part of a complete examination. Limited examinations for reoccurring indications may be  performed as noted. The reflux portion of the exam is performed with the patient in reverse Trendelenburg.  +----------+---------------+---------+-----------+----------+------------------+ RIGHT     CompressibilityPhasicitySpontaneityPropertiesThrombus Aging     +----------+---------------+---------+-----------+----------+------------------+ CFV       Partial        Yes      Yes                  MOBILE acute       +----------+---------------+---------+-----------+----------+------------------+ SFJ       Full                                                            +----------+---------------+---------+-----------+----------+------------------+  FV Prox   Partial        Yes      Yes                  Acute              +----------+---------------+---------+-----------+----------+------------------+ FV Mid    Full           Yes      Yes                                     +----------+---------------+---------+-----------+----------+------------------+ FV Distal Full           Yes      Yes                                     +----------+---------------+---------+-----------+----------+------------------+ PFV       Partial        Yes      Yes                  Acute              +----------+---------------+---------+-----------+----------+------------------+ POP       Partial        Yes      Yes                  Acute              +----------+---------------+---------+-----------+----------+------------------+ PTV       None                                         acute in proximal                                                         portion            +----------+---------------+---------+-----------+----------+------------------+ PERO      None                                         acute in proximal                                                         portion             +----------+---------------+---------+-----------+----------+------------------+ EIV                                                    patent             +----------+---------------+---------+-----------+----------+------------------+ distal CIV               Yes  Yes                  patent             +----------+---------------+---------+-----------+----------+------------------+   +---------+---------------+---------+-----------+----------+--------------+ LEFT     CompressibilityPhasicitySpontaneityPropertiesThrombus Aging +---------+---------------+---------+-----------+----------+--------------+ CFV      Full           Yes      Yes                                 +---------+---------------+---------+-----------+----------+--------------+ SFJ      Full                                                        +---------+---------------+---------+-----------+----------+--------------+ FV Prox  Full                                                        +---------+---------------+---------+-----------+----------+--------------+ FV Mid   Full                                                        +---------+---------------+---------+-----------+----------+--------------+ FV DistalFull                                                        +---------+---------------+---------+-----------+----------+--------------+ PFV      Full                                                        +---------+---------------+---------+-----------+----------+--------------+ POP      Full           Yes      Yes                                 +---------+---------------+---------+-----------+----------+--------------+ PTV      Full                                                        +---------+---------------+---------+-----------+----------+--------------+ PERO     Full                                                         +---------+---------------+---------+-----------+----------+--------------+     Summary: BILATERAL: -No evidence of popliteal cyst, bilaterally.  RIGHT: - Findings consistent with acute deep vein thrombosis involving the right common femoral vein (MOBILE), partial thrombus in the right proximal femoral, right profunda, and right popliteal veins, and acute DVT noted in the right proximal posterior tibial,  and right peroneal veins. Right external iliac and distal common iliac veins are patent.   LEFT: - No evidence of deep vein thrombosis in the lower extremity. No indirect evidence of obstruction proximal to the inguinal ligament.   *See table(s) above for measurements and observations. Electronically signed by Heath Lark on 12/10/2022 at 10:48:22 PM.    Final    ECHOCARDIOGRAM COMPLETE  Result Date: 12/10/2022    ECHOCARDIOGRAM REPORT   Patient Name:   BLAYDE BOAT Date of Exam: 12/10/2022 Medical Rec #:  [id number]        Height:       68.0 in Accession #:    [id number]       Weight:       153.4 lb Date of Birth:  Dec 09, 1940        BSA:          1.826 m Patient Age:    82 years         BP:           88/49 mmHg Patient Gender: M                HR:           102 bpm. Exam Location:  Inpatient Procedure: 2D Echo, Cardiac Doppler and Color Doppler Indications:    Pulmonary Embolus I26.09  History:        Patient has prior history of Echocardiogram examinations, most                 recent 07/07/2016. CAD; Risk Factors:Hypertension and                 Dyslipidemia.  Sonographer:    Lucendia Herrlich Referring Phys: [id number] Jonny Ruiz D PAYNE IMPRESSIONS  1. Left ventricular ejection fraction, by estimation, is 35 to 40%. The left ventricle has moderately decreased function. The left ventricle demonstrates global hypokinesis. Left ventricular diastolic parameters are indeterminate.  2. Right ventricular systolic function is moderately reduced. The right ventricular size is moderately enlarged. There is moderately  elevated pulmonary artery systolic pressure. The estimated right ventricular systolic pressure is 51.0 mmHg.  3. Left atrial size was severely dilated.  4. Right atrial size was mildly dilated.  5. The mitral valve is normal in structure. Moderate to severe mitral valve regurgitation. No evidence of mitral stenosis.  6. The aortic valve is tricuspid. There is mild calcification of the aortic valve. Aortic valve regurgitation is mild to moderate. No aortic stenosis is present. Aortic regurgitation PHT measures 469 msec.  7. Aortic dilatation noted. There is mild dilatation of the ascending aorta, measuring 40 mm.  8. The inferior vena cava is dilated in size with <50% respiratory variability, suggesting right atrial pressure of 15 mmHg. FINDINGS  Left Ventricle: Left ventricular ejection fraction, by estimation, is 35 to 40%. The left ventricle has moderately decreased function. The left ventricle demonstrates global hypokinesis. The left ventricular internal cavity size was normal in size. There is no left ventricular hypertrophy. Left ventricular diastolic parameters are indeterminate. Right Ventricle: The right ventricular size is moderately enlarged. No increase in right ventricular wall thickness. Right ventricular systolic function is moderately reduced. There is moderately elevated pulmonary artery systolic pressure. The tricuspid  regurgitant velocity is 3.00  m/s, and with an assumed right atrial pressure of 15 mmHg, the estimated right ventricular systolic pressure is 51.0 mmHg. Left Atrium: Left atrial size was severely dilated. Right Atrium: Right atrial size was mildly dilated. Pericardium: There is no evidence of pericardial effusion. Mitral Valve: The mitral valve is normal in structure. Moderate to severe mitral valve regurgitation. No evidence of mitral valve stenosis. Tricuspid Valve: The tricuspid valve is normal in structure. Tricuspid valve regurgitation is mild . No evidence of tricuspid  stenosis. Aortic Valve: The aortic valve is tricuspid. There is mild calcification of the aortic valve. Aortic valve regurgitation is mild to moderate. Aortic regurgitation PHT measures 469 msec. No aortic stenosis is present. Aortic valve peak gradient measures 5.1 mmHg. Pulmonic Valve: The pulmonic valve was normal in structure. Pulmonic valve regurgitation is trivial. No evidence of pulmonic stenosis. Aorta: Aortic dilatation noted. There is mild dilatation of the ascending aorta, measuring 40 mm. Venous: The inferior vena cava is dilated in size with less than 50% respiratory variability, suggesting right atrial pressure of 15 mmHg. IAS/Shunts: No atrial level shunt detected by color flow Doppler.  LEFT VENTRICLE PLAX 2D LVIDd:         5.50 cm      Diastology LVIDs:         4.50 cm      LV e' medial:    9.95 cm/s LV PW:         0.80 cm      LV E/e' medial:  9.0 LV IVS:        0.80 cm      LV e' lateral:   23.60 cm/s LVOT diam:     2.20 cm      LV E/e' lateral: 3.8 LV SV:         54 LV SV Index:   29 LVOT Area:     3.80 cm  LV Volumes (MOD) LV vol d, MOD A2C: 118.0 ml LV vol d, MOD A4C: 103.0 ml LV vol s, MOD A2C: 51.4 ml LV vol s, MOD A4C: 59.8 ml LV SV MOD A2C:     66.6 ml LV SV MOD A4C:     103.0 ml LV SV MOD BP:      49.8 ml RIGHT VENTRICLE             IVC RV S prime:     14.10 cm/s  IVC diam: 2.60 cm TAPSE (M-mode): 1.3 cm LEFT ATRIUM              Index        RIGHT ATRIUM           Index LA diam:        4.70 cm  2.57 cm/m   RA Area:     15.30 cm LA Vol (A2C):   84.2 ml  46.11 ml/m  RA Volume:   32.50 ml  17.80 ml/m LA Vol (A4C):   103.0 ml 56.41 ml/m LA Biplane Vol: 94.5 ml  51.75 ml/m  AORTIC VALVE                 PULMONIC VALVE AV Area (Vmax): 3.08 cm     PR End Diast Vel: 9.24 msec AV Vmax:        113.00 cm/s AV Peak Grad:   5.1 mmHg LVOT Vmax:      91.57 cm/s LVOT Vmean:     60.667 cm/s LVOT VTI:       0.141 m AI  PHT:         469 msec  AORTA Ao Root diam: 3.60 cm Ao Asc diam:  4.00 cm MITRAL  VALVE               TRICUSPID VALVE MV Area (PHT): 4.80 cm    TR Peak grad:   36.0 mmHg MV Decel Time: 158 msec    TR Vmax:        300.00 cm/s MR Peak grad: 73.2 mmHg MR Vmax:      427.67 cm/s  SHUNTS MV E velocity: 90.00 cm/s  Systemic VTI:  0.14 m                            Systemic Diam: 2.20 cm Arvilla Meres MD Electronically signed by Arvilla Meres MD Signature Date/Time: 12/10/2022/1:09:44 PM    Final    EEG adult  Result Date: 12/10/2022 Charlsie Quest, MD     12/10/2022  2:50 PM Patient Name: JEFFRY RISS MRN: [id number] Epilepsy Attending: Charlsie Quest Referring Physician/Provider: Kalman Shan, MD Date: 12/10/2022 Duration: 26.13 mins Patient history: 82yo M with ams getting eeg to evaluate for seizure. Level of alertness: Awake, asleep AEDs during EEG study: Ativan Technical aspects: This EEG study was done with scalp electrodes positioned according to the 10-20 International system of electrode placement. Electrical activity was reviewed with band pass filter of 1-70Hz , sensitivity of 7 uV/mm, display speed of 23mm/sec with a 60Hz  notched filter applied as appropriate. EEG data were recorded continuously and digitally stored.  Video monitoring was available and reviewed as appropriate. Description: The posterior dominant rhythm consists of 7.5 Hz activity of moderate voltage (25-35 uV) seen predominantly in posterior head regions, symmetric and reactive to eye opening and eye closing. Sleep was characterized by vertex waves, sleep spindles (12 to 14 Hz), maximal frontocentral region. EEG showed intermittent generalized 3 to 6 Hz theta-delta slowing. Hyperventilation and photic stimulation were not performed.   ABNORMALITY - Intermittent slow, generalized IMPRESSION: This study is suggestive of mild diffuse encephalopathy. No seizures or epileptiform discharges were seen throughout the recording. Charlsie Quest   DG CHEST PORT 1 VIEW  Result Date: 12/10/2022 CLINICAL DATA:   [id number] Central line clotted, initial encounter [facility]) [id number]. EXAM: PORTABLE CHEST 1 VIEW COMPARISON:  Chest radiograph 12/09/2022. FINDINGS: Interval placement of the left subclavian approach central venous catheter with tip projecting over the upper SVC. Low lung volumes accentuate the pulmonary vasculature and cardiomediastinal silhouette. Bibasilar atelectasis. No consolidation or pulmonary edema. No pleural effusion or pneumothorax. IMPRESSION: Interval placement of the left subclavian approach central venous catheter with tip projecting over the upper SVC. No pneumothorax. Electronically Signed   By: Orvan Falconer M.D.   On: 12/10/2022 08:59   US Abdomen Limited  Result Date: 12/10/2022 CLINICAL DATA:  Right upper quadrant pain. EXAM: ULTRASOUND ABDOMEN LIMITED RIGHT UPPER QUADRANT COMPARISON:  None Available. FINDINGS: Exam somewhat limited due to patient's level of consciousness and inability to move or cooperate during the exam. Gallbladder: Gallbladder somewhat distended with moderate sludge present. No definite stones. No wall thickening or pericholecystic fluid. Patient not contrast during the examination as unable to assess sonographic Murphy sign. Common bile duct: Diameter: 3.1 mm. Liver: No focal lesion identified. Mild increase parenchymal echogenicity compatible with a degree of steatosis. Portal vein is patent on color Doppler imaging with normal direction of blood flow towards the liver. Other: None. IMPRESSION: 1. Moderate gallbladder sludge without  additional sonographic evidence to suggest acute cholecystitis. 2.  Suggestion of hepatic steatosis. Electronically Signed   By: Elberta Fortis M.D.   On: 12/10/2022 07:56   [place] EKG SITE RITE  Result Date: 12/10/2022 If Site Rite image not attached, placement could not be confirmed due to current cardiac rhythm.  CT Angio Chest PE W and/or Wo Contrast  Addendum Date: 12/10/2022   ADDENDUM REPORT: 12/10/2022 04:21 ADDENDUM: Critical  Value/emergent results were called by telephone at the time of interpretation on 12/10/2022 at 4:21 am to provider Berle Mull , who verbally acknowledged these results. Electronically Signed   By: Thornell Sartorius M.D.   On: 12/10/2022 04:21   Result Date: 12/10/2022 CLINICAL DATA:  Pulmonary embolism suspected, high probability. Abdominal pain, acute, nonlocalized. EXAM: CT ANGIOGRAPHY CHEST CT ABDOMEN AND PELVIS WITH CONTRAST TECHNIQUE: Multidetector CT imaging of the chest was performed using the standard protocol during bolus administration of intravenous contrast. Multiplanar CT image reconstructions and MIPs were obtained to evaluate the vascular anatomy. Multidetector CT imaging of the abdomen and pelvis was performed using the standard protocol during bolus administration of intravenous contrast. RADIATION DOSE REDUCTION: This exam was performed according to the departmental dose-optimization program which includes automated exposure control, adjustment of the mA and/or kV according to patient size and/or use of iterative reconstruction technique. CONTRAST:  75mL OMNIPAQUE IOHEXOL 350 MG/ML SOLN COMPARISON:  10/31/2022, 08/31/2009. FINDINGS: CTA CHEST FINDINGS Cardiovascular: The heart is enlarged and there is a small pericardial effusion. Multi-vessel coronary artery calcifications are noted. There is atherosclerotic calcification of the aorta without evidence of aneurysm. The pulmonary trunk is normal in caliber. Multiple pulmonary artery filling defects are identified in the lobar, segmental, and subsegmental arteries on the right. There is no evidence of right heart strain. Mediastinum/Nodes: No mediastinal, hilar, or axillary lymphadenopathy. The thyroid gland, trachea, and esophagus are within normal limits. Lungs/Pleura: Strandy atelectasis or infiltrate is present at the lung bases. There are small bilateral pleural effusions, greater on the right than on the left. No pneumothorax.  Musculoskeletal: Sternotomy wires are noted. Cervical spinal fusion hardware is present. Degenerative changes are present in the thoracic spine. No acute osseous abnormality. Review of the MIP images confirms the above findings. CT ABDOMEN and PELVIS FINDINGS Hepatobiliary: No focal abnormality in the liver. No biliary ductal dilatation. The gallbladder is distended measuring 6.4 cm in diameter with multiple layering stones. Pancreas: Unremarkable. No pancreatic ductal dilatation or surrounding inflammatory changes. Spleen: Normal in size without focal abnormality. Adrenals/Urinary Tract: The adrenal glands are within normal limits. The kidneys enhance symmetrically. Cysts are present in the right kidney. No renal calculus or hydronephrosis. Foley catheter terminates in the urinary bladder. There is mild bladder wall thickening. Stomach/Bowel: Gastric wall thickening is noted. No bowel obstruction, free air, or pneumatosis. Scattered diverticula are present along the colon without evidence of diverticulitis. No focal bowel wall thickening. The appendix is not seen. Vascular/Lymphatic: There is atherosclerotic calcification of the aorta with focal aneurysmal dilatation of the proximal abdominal aorta measuring 3.8 x 2.7 cm. There is aneurysmal dilatation of the common iliac artery on the right measuring 2.2 cm. No abdominal or pelvic lymphadenopathy. A filling defect is present in the common femoral vein on the right. Reproductive: Prostate is unremarkable. Other: No abdominopelvic ascites. Musculoskeletal: Degenerative changes are present in the lumbar spine. No acute osseous abnormality. Review of the MIP images confirms the above findings. IMPRESSION: 1. Multiple pulmonary emboli on the right. No evidence of right heart strain. 2.  Filling defect in the common femoral vein on the right, compatible with DVT. 3. Strandy atelectasis or infiltrate at the lung bases. 4. Small bilateral pleural effusions. 5. Cardiomegaly  with multi-vessel coronary artery calcifications. 6. Gallbladder hydrops with multiple layering stones in the gallbladder. 7. Mild gallbladder wall thickening, possible infectious or inflammatory cystitis. 8. Diverticulosis without diverticulitis. 9. 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 Recommend follow-up ultrasound every 2 years. This recommendation follows ACR consensus guidelines: White Paper of the ACR Incidental Findings Committee II on Vascular Findings. J Am Coll Radiol 2013; 10:789-794. Electronically Signed: By: Thornell Sartorius M.D. On: 12/10/2022 04:17   CT ABDOMEN PELVIS W CONTRAST  Addendum Date: 12/10/2022   ADDENDUM REPORT: 12/10/2022 04:21 ADDENDUM: Critical Value/emergent results were called by telephone at the time of interpretation on 12/10/2022 at 4:21 am to provider Berle Mull , who verbally acknowledged these results. Electronically Signed   By: Thornell Sartorius M.D.   On: 12/10/2022 04:21   Result Date: 12/10/2022 CLINICAL DATA:  Pulmonary embolism suspected, high probability. Abdominal pain, acute, nonlocalized. EXAM: CT ANGIOGRAPHY CHEST CT ABDOMEN AND PELVIS WITH CONTRAST TECHNIQUE: Multidetector CT imaging of the chest was performed using the standard protocol during bolus administration of intravenous contrast. Multiplanar CT image reconstructions and MIPs were obtained to evaluate the vascular anatomy. Multidetector CT imaging of the abdomen and pelvis was performed using the standard protocol during bolus administration of intravenous contrast. RADIATION DOSE REDUCTION: This exam was performed according to the departmental dose-optimization program which includes automated exposure control, adjustment of the mA and/or kV according to patient size and/or use of iterative reconstruction technique. CONTRAST:  75mL OMNIPAQUE IOHEXOL 350 MG/ML SOLN COMPARISON:  10/31/2022, 08/31/2009. FINDINGS: CTA CHEST FINDINGS  Cardiovascular: The heart is enlarged and there is a small pericardial effusion. Multi-vessel coronary artery calcifications are noted. There is atherosclerotic calcification of the aorta without evidence of aneurysm. The pulmonary trunk is normal in caliber. Multiple pulmonary artery filling defects are identified in the lobar, segmental, and subsegmental arteries on the right. There is no evidence of right heart strain. Mediastinum/Nodes: No mediastinal, hilar, or axillary lymphadenopathy. The thyroid gland, trachea, and esophagus are within normal limits. Lungs/Pleura: Strandy atelectasis or infiltrate is present at the lung bases. There are small bilateral pleural effusions, greater on the right than on the left. No pneumothorax. Musculoskeletal: Sternotomy wires are noted. Cervical spinal fusion hardware is present. Degenerative changes are present in the thoracic spine. No acute osseous abnormality. Review of the MIP images confirms the above findings. CT ABDOMEN and PELVIS FINDINGS Hepatobiliary: No focal abnormality in the liver. No biliary ductal dilatation. The gallbladder is distended measuring 6.4 cm in diameter with multiple layering stones. Pancreas: Unremarkable. No pancreatic ductal dilatation or surrounding inflammatory changes. Spleen: Normal in size without focal abnormality. Adrenals/Urinary Tract: The adrenal glands are within normal limits. The kidneys enhance symmetrically. Cysts are present in the right kidney. No renal calculus or hydronephrosis. Foley catheter terminates in the urinary bladder. There is mild bladder wall thickening. Stomach/Bowel: Gastric wall thickening is noted. No bowel obstruction, free air, or pneumatosis. Scattered diverticula are present along the colon without evidence of diverticulitis. No focal bowel wall thickening. The appendix is not seen. Vascular/Lymphatic: There is atherosclerotic calcification of the aorta with focal aneurysmal dilatation of the proximal  abdominal aorta measuring 3.8 x 2.7 cm. There is aneurysmal dilatation of the common iliac artery on the right measuring 2.2 cm. No abdominal or pelvic lymphadenopathy.  A filling defect is present in the common femoral vein on the right. Reproductive: Prostate is unremarkable. Other: No abdominopelvic ascites. Musculoskeletal: Degenerative changes are present in the lumbar spine. No acute osseous abnormality. Review of the MIP images confirms the above findings. IMPRESSION: 1. Multiple pulmonary emboli on the right. No evidence of right heart strain. 2. Filling defect in the common femoral vein on the right, compatible with DVT. 3. Strandy atelectasis or infiltrate at the lung bases. 4. Small bilateral pleural effusions. 5. Cardiomegaly with multi-vessel coronary artery calcifications. 6. Gallbladder hydrops with multiple layering stones in the gallbladder. 7. Mild gallbladder wall thickening, possible infectious or inflammatory cystitis. 8. Diverticulosis without diverticulitis. 9. 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 Recommend follow-up ultrasound every 2 years. This recommendation follows ACR consensus guidelines: White Paper of the ACR Incidental Findings Committee II on Vascular Findings. J Am Coll Radiol 2013; 10:789-794. Electronically Signed: By: Thornell Sartorius M.D. On: 12/10/2022 04:17   CT HEAD WO CONTRAST ( )  Result Date: 12/09/2022 CLINICAL DATA:  Delirium. EXAM: CT HEAD WITHOUT CONTRAST TECHNIQUE: Contiguous axial images were obtained from the base of the skull through the vertex without intravenous contrast. RADIATION DOSE REDUCTION: This exam was performed according to the departmental dose-optimization program which includes automated exposure control, adjustment of the mA and/or kV according to patient size and/or use of iterative reconstruction technique. COMPARISON:  Head CT 07/22/2019 FINDINGS: Brain: No evidence of acute  infarction, hemorrhage, hydrocephalus, extra-axial collection or mass lesion/mass effect. There is stable mild periventricular white matter hypodensity and stable mild diffuse atrophy. 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 Vascular: No hyperdense vessel or unexpected calcification. Skull: Normal. Negative for fracture or focal lesion. Sinuses/Orbits: No acute finding. Other: None. IMPRESSION: 1. No acute intracranial abnormality identified. 2. Stable mild periventricular white matter hypodensity and stable mild diffuse atrophy. 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 Electronically Signed   By: Darliss Cheney M.D.   On: 12/09/2022 23:32   DG Chest 2 View  Result Date: 12/09/2022 CLINICAL DATA:  Dyspnea, altered mental status EXAM: CHEST - 2 VIEW COMPARISON:  12/31/2016 FINDINGS: Lateral examination slightly limited with exclusion of the a posterior lungs and costophrenic angles. Lungs are clear. No pneumothorax or pleural effusion. Coronary artery bypass grafting has been performed. Cardiac size within normal limits. Pulmonary vascularity is normal. No acute bone abnormality. IMPRESSION: 1. No active cardiopulmonary disease. 2. Status post coronary artery bypass grafting. Electronically Signed   By: Helyn Numbers M.D.   On: 12/09/2022 23:07     Subjective:  Significant events overnight, appetite has improved, he is drinking his supplements as well Discharge Exam: Vitals:   12/21/22 0702 12/21/22 0800  BP: (!) 94/59 108/63  Pulse: 70 78  Resp: 18 18  Temp: (!) 97.3 F (36.3 C)   SpO2: 94% 93%   Vitals:   12/21/22 0200 12/21/22 0400 12/21/22 0702 12/21/22 0800  BP:  114/63 (!) 94/59 108/63  Pulse:  66 70 78  Resp: (!) 21 14 18 18   Temp:  98.1 F (36.7 C) (!) 97.3 F (36.3 C)   TempSrc:  Oral    SpO2:  91% 94% 93%  Weight:  67.3 kg    Height:        General: Pt is alert, awake, not in acute distress, frail, deconditioned Cardiovascular: RRR, S1/S2 +, no  rubs, no gallops Respiratory: CTA bilaterally, no wheezing, no rhonchi Abdominal: Soft, NT,  ND, bowel sounds + Extremities: no edema, significant bruising and ecchymosis    The results of significant diagnostics from this hospitalization (including imaging, microbiology, ancillary and laboratory) are listed below for reference.     Microbiology: No results found for this or any previous visit (from the past 240 hour(s)).   Labs: BNP (last 3 results) Recent Labs    12/17/22 0323 12/18/22 0417 12/19/22 0837  BNP 1,309.5* 878.0* 719.9*   Basic Metabolic Panel: Recent Labs  Lab 12/15/22 0341 12/16/22 0401 12/17/22 0323 12/18/22 0417 12/19/22 0837 12/20/22 1626 12/21/22 0413  NA 137 139 136 135 133* 135  --   K 3.8 3.3* 3.2* 3.4* 4.1 4.0  --   CL 105 103 101 98 98 100  --   CO2 24 28 28 28 25 28   --   GLUCOSE 137* 113* 114* 107* 98 127*  --   BUN 8 9 10 9 11 13   --   CREATININE 0.92 0.90 0.95 0.99 1.04 1.20  --   CALCIUM 7.6* 7.9* 7.7* 7.9* 8.4* 8.1*  --   MG 2.0 1.6* 1.8 2.0 1.7  --   --   PHOS  --   --   --   --   --   --  3.9   Liver Function Tests: Recent Labs  Lab 12/15/22 0341  AST 14*  ALT 14  ALKPHOS 45  BILITOT 1.3*  PROT 4.7*  ALBUMIN 2.5*   No results for input(s): "LIPASE", "AMYLASE" in the last 168 hours. No results for input(s): "AMMONIA" in the last 168 hours. CBC: Recent Labs  Lab 12/16/22 0401 12/17/22 0323 12/18/22 0417 12/19/22 0837 12/20/22 1626  WBC 10.2 9.6 8.6 6.6 6.3  NEUTROABS 7.7 7.2 5.9 4.7 4.2  HGB 9.6* 9.9* 9.4* 9.5* 10.0*  HCT 30.2* 30.4* 29.1* 29.1* 30.5*  MCV 95.3 95.6 96.4 97.0 95.0  PLT 180 174 172 172 132*   Cardiac Enzymes: No results for input(s): "CKTOTAL", "CKMB", "CKMBINDEX", "TROPONINI" in the last 168 hours. BNP: Invalid input(s): "POCBNP" CBG: Recent Labs  Lab 12/14/22 1503  GLUCAP 170*   D-Dimer No results for input(s): "DDIMER" in the last 72 hours. Hgb A1c No results for input(s): "HGBA1C" in  the last 72 hours. Lipid Profile No results for input(s): "CHOL", "HDL", "LDLCALC", "TRIG", "CHOLHDL", "LDLDIRECT" in the last 72 hours. Thyroid function studies No results for input(s): "TSH", "T4TOTAL", "T3FREE", "THYROIDAB" in the last 72 hours.  Invalid input(s): "FREET3" Anemia work up No results for input(s): "VITAMINB12", "FOLATE", "FERRITIN", "TIBC", "IRON", "RETICCTPCT" in the last 72 hours. Urinalysis    Component Value Date/Time   COLORURINE AMBER (A) 12/10/2022 0250   APPEARANCEUR HAZY (A) 12/10/2022 0250   LABSPEC 1.023 12/10/2022 0250   PHURINE 5.0 12/10/2022 0250   GLUCOSEU NEGATIVE 12/10/2022 0250   HGBUR NEGATIVE 12/10/2022 0250   BILIRUBINUR NEGATIVE 12/10/2022 0250   BILIRUBINUR small 04/17/2014 1515   KETONESUR 5 (A) 12/10/2022 0250   PROTEINUR 30 (A) 12/10/2022 0250   UROBILINOGEN 0.2 04/17/2014 1515   UROBILINOGEN 0.2 08/30/2009 1137   NITRITE NEGATIVE 12/10/2022 0250   LEUKOCYTESUR NEGATIVE 12/10/2022 0250   Sepsis Labs Recent Labs  Lab 12/17/22 0323 12/18/22 0417 12/19/22 0837 12/20/22 1626  WBC 9.6 8.6 6.6 6.3   Microbiology No results found for this or any previous visit (from the past 240 hour(s)).   Time coordinating discharge: Over 30 minutes  SIGNED:   Huey Bienenstock, MD  [facility] 12/21/2022, 2:40 PM Pager   If  7PM-7AM, please contact night-coverage [email or web address] Password [email or web address]

## 2022-12-22 ENCOUNTER — Other Ambulatory Visit (HOSPITAL_COMMUNITY): Payer: Self-pay

## 2022-12-28 ENCOUNTER — Ambulatory Visit (HOSPITAL_COMMUNITY): Payer: PPO | Admitting: Internal Medicine

## 2022-12-29 ENCOUNTER — Emergency Department (HOSPITAL_COMMUNITY)
Admission: EM | Admit: 2022-12-29 | Discharge: 2022-12-30 | Disposition: A | Discharge status: Home / Self Care | Payer: PPO | Attending: Emergency Medicine | Admitting: Emergency Medicine

## 2022-12-29 ENCOUNTER — Emergency Department (HOSPITAL_COMMUNITY): Payer: PPO

## 2022-12-29 ENCOUNTER — Encounter (HOSPITAL_COMMUNITY): Payer: Self-pay

## 2022-12-29 ENCOUNTER — Other Ambulatory Visit: Payer: Self-pay

## 2022-12-29 DIAGNOSIS — R1030 Lower abdominal pain, unspecified: Secondary | ICD-10-CM | POA: Diagnosis present

## 2022-12-29 DIAGNOSIS — Z7901 Long term (current) use of anticoagulants: Secondary | ICD-10-CM | POA: Insufficient documentation

## 2022-12-29 DIAGNOSIS — N390 Urinary tract infection, site not specified: Secondary | ICD-10-CM | POA: Diagnosis not present

## 2022-12-29 DIAGNOSIS — R1032 Left lower quadrant pain: Secondary | ICD-10-CM | POA: Diagnosis not present

## 2022-12-29 DIAGNOSIS — K828 Other specified diseases of gallbladder: Secondary | ICD-10-CM | POA: Diagnosis not present

## 2022-12-29 DIAGNOSIS — N400 Enlarged prostate without lower urinary tract symptoms: Secondary | ICD-10-CM | POA: Diagnosis not present

## 2022-12-29 DIAGNOSIS — K8020 Calculus of gallbladder without cholecystitis without obstruction: Secondary | ICD-10-CM | POA: Diagnosis not present

## 2022-12-29 DIAGNOSIS — I714 Abdominal aortic aneurysm, without rupture, unspecified: Secondary | ICD-10-CM | POA: Diagnosis not present

## 2022-12-29 DIAGNOSIS — R1084 Generalized abdominal pain: Secondary | ICD-10-CM | POA: Diagnosis not present

## 2022-12-29 DIAGNOSIS — E876 Hypokalemia: Secondary | ICD-10-CM | POA: Diagnosis not present

## 2022-12-29 DIAGNOSIS — I959 Hypotension, unspecified: Secondary | ICD-10-CM | POA: Diagnosis not present

## 2022-12-29 LAB — CBC WITH DIFFERENTIAL/PLATELET
Abs Immature Granulocytes: 0.02 K/uL (ref 0.00–0.07)
Basophils Absolute: 0 K/uL (ref 0.0–0.1)
Basophils Relative: 0 %
Eosinophils Absolute: 0.2 K/uL (ref 0.0–0.5)
Eosinophils Relative: 4 %
HCT: 36.3 % — ABNORMAL LOW (ref 39.0–52.0)
Hemoglobin: 11.8 g/dL — ABNORMAL LOW (ref 13.0–17.0)
Immature Granulocytes: 0 %
Lymphocytes Relative: 31 %
Lymphs Abs: 1.8 K/uL (ref 0.7–4.0)
MCH: 32 pg (ref 26.0–34.0)
MCHC: 32.5 g/dL (ref 30.0–36.0)
MCV: 98.4 fL (ref 80.0–100.0)
Monocytes Absolute: 0.4 K/uL (ref 0.1–1.0)
Monocytes Relative: 8 %
Neutro Abs: 3.3 K/uL (ref 1.7–7.7)
Neutrophils Relative %: 57 %
Platelets: 262 K/uL (ref 150–400)
RBC: 3.69 MIL/uL — ABNORMAL LOW (ref 4.22–5.81)
RDW: 18.6 % — ABNORMAL HIGH (ref 11.5–15.5)
WBC: 5.9 K/uL (ref 4.0–10.5)
nRBC: 0 % (ref 0.0–0.2)

## 2022-12-29 LAB — COMPREHENSIVE METABOLIC PANEL WITH GFR
ALT: 21 U/L (ref 0–44)
AST: 34 U/L (ref 15–41)
Albumin: 3.1 g/dL — ABNORMAL LOW (ref 3.5–5.0)
Alkaline Phosphatase: 85 U/L (ref 38–126)
Anion gap: 14 (ref 5–15)
BUN: 19 mg/dL (ref 8–23)
CO2: 28 mmol/L (ref 22–32)
Calcium: 8.7 mg/dL — ABNORMAL LOW (ref 8.9–10.3)
Chloride: 95 mmol/L — ABNORMAL LOW (ref 98–111)
Creatinine, Ser: 1.05 mg/dL (ref 0.61–1.24)
GFR, Estimated: >60 mL/min
Glucose, Bld: 112 mg/dL — ABNORMAL HIGH (ref 70–99)
Potassium: 2.7 mmol/L — CL (ref 3.5–5.1)
Sodium: 137 mmol/L (ref 135–145)
Total Bilirubin: 1.6 mg/dL — ABNORMAL HIGH (ref 0.3–1.2)
Total Protein: 6.9 g/dL (ref 6.5–8.1)

## 2022-12-29 LAB — URINALYSIS, ROUTINE W REFLEX MICROSCOPIC
Bilirubin Urine: NEGATIVE
Glucose, UA: NEGATIVE mg/dL
Hgb urine dipstick: NEGATIVE
Ketones, ur: NEGATIVE mg/dL
Nitrite: NEGATIVE
Protein, ur: NEGATIVE mg/dL
Specific Gravity, Urine: 1.02 (ref 1.005–1.030)
pH: 5 (ref 5.0–8.0)

## 2022-12-29 LAB — LIPASE, BLOOD: Lipase: 45 U/L (ref 11–51)

## 2022-12-29 MED ORDER — IOHEXOL 350 MG/ML SOLN
75.0000 mL | Freq: Once | INTRAVENOUS | Status: AC | PRN
  Administered 2022-12-29: 75 mL via INTRAVENOUS

## 2022-12-29 MED ORDER — CEPHALEXIN 500 MG PO CAPS: 500.0000 mg | ORAL_CAPSULE | Freq: Two times a day (BID) | ORAL | 0 refills | Status: AC

## 2022-12-29 MED ORDER — POTASSIUM CHLORIDE CRYS ER 20 MEQ PO TBCR
40.0000 meq | EXTENDED_RELEASE_TABLET | Freq: Once | ORAL | Status: AC
  Administered 2022-12-29: 40 meq via ORAL
  Filled 2022-12-29: qty 2

## 2022-12-29 MED ORDER — CEPHALEXIN 250 MG PO CAPS
500.0000 mg | ORAL_CAPSULE | Freq: Once | ORAL | Status: AC
  Administered 2022-12-29: 500 mg via ORAL
  Filled 2022-12-29: qty 2

## 2022-12-29 MED ORDER — SODIUM CHLORIDE 0.9 % IV SOLN: INTRAVENOUS | Status: DC

## 2022-12-29 MED ORDER — POTASSIUM CHLORIDE IN NACL 20-0.9 MEQ/L-% IV SOLN
Freq: Once | INTRAVENOUS | Status: AC
  Filled 2022-12-29: qty 1000

## 2022-12-29 NOTE — Discharge Instructions (Signed)
Today's evaluation has been generally reassuring.  Given your ongoing efforts to recover from your hospitalization it is important to monitor your condition carefully, take your newly prescribed antibiotics as directed and follow-up with your physician.  Return here for concerning changes in your condition.

## 2022-12-29 NOTE — ED Provider Notes (Signed)
[place] EMERGENCY DEPARTMENT AT [facility] Provider Note   CSN: [id number] Arrival date & time: 12/29/22  1720     History  Chief Complaint  Patient presents with   Abdominal Pain    BRIER REID is a 82 y.o. male.  HPI Presents in home with a caregiver who assists with the history. Presents with concern for diffuse lower abdominal pain. The patient describes some loose stool recently, the caregiver notes that he has been having normal bowel movements for the past few days. No anorexia, no urinary complaints aside from polyuria. Patient was discharged from this facility about 1 week ago, he notes that since that time he has had the pain.     Home Medications Prior to Admission medications   Medication Sig Start Date End Date Taking? Authorizing Provider  amiodarone (PACERONE) 200 MG tablet Take 1 tablet (200 mg total) by mouth daily. 12/22/22  Yes Elgergawy, Leana Roe, MD  apixaban (ELIQUIS) 5 MG TABS tablet Take 1 tablet (5 mg total) by mouth 2 (two) times daily. 12/21/22  Yes Elgergawy, Leana Roe, MD  cephALEXin (KEFLEX) 500 MG capsule Take 1 capsule (500 mg total) by mouth 2 (two) times daily for 5 days. 12/29/22 01/03/23 Yes Gerhard Munch, MD  cyanocobalamin (VITAMIN B12) 1000 MCG tablet Take 1 tablet (1,000 mcg total) by mouth daily. 12/21/22  Yes Elgergawy, Leana Roe, MD  folic acid (FOLVITE) 1 MG tablet Take 1 tablet (1 mg total) by mouth daily. 12/22/22  Yes Elgergawy, Leana Roe, MD  ibuprofen (ADVIL) 200 MG tablet Take 200 mg by mouth daily as needed for headache or moderate pain.   Yes [provider]  levothyroxine (SYNTHROID) 100 MCG tablet TAKE 1 TABLET BY MOUTH ONCE DAILY IN THE MORNING -  NEED  OFFICE  VISIT  WITH  DR.  Tresa Endo  FOR  FUTURE  REFILLS 06/29/22  Yes Lennette Bihari, MD  magic mouthwash w/lidocaine SOLN Take 5 mLs by mouth 4 (four) times daily. 12/21/22  Yes Elgergawy, Leana Roe, MD  mirtazapine (REMERON SOL-TAB) 15 MG disintegrating  tablet Take 1 tablet (15 mg total) by mouth at bedtime. 12/21/22  Yes Elgergawy, Leana Roe, MD  Multiple Vitamins-Minerals (MULTIVITAMIN GUMMIES MENS) CHEW Chew 1 each by mouth daily.   Yes [provider]  Sennosides 25 MG TABS Take 25-75 mg by mouth daily.   Yes [provider]  torsemide (DEMADEX) 20 MG tablet Take 1 tablet (20 mg total) by mouth daily. 12/22/22  Yes Elgergawy, Leana Roe, MD      Allergies    Procardia [nifedipine] and Phenergan [promethazine hcl]    Review of Systems   Review of Systems  All other systems reviewed and are negative.   Physical Exam Updated Vital Signs BP 104/68 (BP Location: Left Arm)   Pulse 78   Temp 97.7 F (36.5 C) (Oral)   Resp 16   Ht 5\' 8"  (1.727 m)   Wt 67.3 kg   SpO2 100%   BMI 22.56 kg/m  Physical Exam Vitals and nursing note reviewed.  Constitutional:      General: He is not in acute distress.    Appearance: He is well-developed.  HENT:     Head: Normocephalic and atraumatic.  Eyes:     Conjunctiva/sclera: Conjunctivae normal.  Cardiovascular:     Rate and Rhythm: Normal rate and regular rhythm.  Pulmonary:     Effort: Pulmonary effort is normal. No respiratory distress.     Breath  sounds: No stridor.  Abdominal:     General: There is no distension.     Tenderness: There is abdominal tenderness in the right lower quadrant, suprapubic area and left lower quadrant. There is no guarding or rebound. Negative signs include Murphy's sign and McBurney's sign.  Skin:    General: Skin is warm and dry.  Neurological:     Mental Status: He is alert and oriented to person, place, and time.     ED Results / Procedures / Treatments   Labs (all labs ordered are listed, but only abnormal results are displayed) Labs Reviewed  COMPREHENSIVE METABOLIC PANEL - Abnormal; Notable for the following components:      Result Value   Potassium 2.7 (*)    Chloride 95 (*)    Glucose, Bld 112 (*)    Calcium 8.7 (*)    Albumin  3.1 (*)    Total Bilirubin 1.6 (*)    All other components within normal limits  CBC WITH DIFFERENTIAL/PLATELET - Abnormal; Notable for the following components:   RBC 3.69 (*)    Hemoglobin 11.8 (*)    HCT 36.3 (*)    RDW 18.6 (*)    All other components within normal limits  URINALYSIS, ROUTINE W REFLEX MICROSCOPIC - Abnormal; Notable for the following components:   Leukocytes,Ua TRACE (*)    Bacteria, UA RARE (*)    All other components within normal limits  LIPASE, BLOOD    EKG None  Radiology CT ABDOMEN PELVIS W CONTRAST  Result Date: 12/29/2022 CLINICAL DATA:  Left lower quadrant abdominal pain EXAM: CT ABDOMEN AND PELVIS WITH CONTRAST TECHNIQUE: Multidetector CT imaging of the abdomen and pelvis was performed using the standard protocol following bolus administration of intravenous contrast. RADIATION DOSE REDUCTION: This exam was performed according to the departmental dose-optimization program which includes automated exposure control, adjustment of the mA and/or kV according to patient size and/or use of iterative reconstruction technique. CONTRAST:  75mL OMNIPAQUE IOHEXOL 350 MG/ML SOLN COMPARISON:  12/10/2022 FINDINGS: Lower chest: Small bilateral pleural effusions with basilar atelectasis. Residual contrast material or opaque medication in the esophagus. This may represent dysmotility or reflux. Hepatobiliary: Layering small stones in the gallbladder. Gallbladder is distended. No wall thickening. No bile duct dilatation. No focal liver lesions. Pancreas: Unremarkable. No pancreatic ductal dilatation or surrounding inflammatory changes. Spleen: Normal in size without focal abnormality. Adrenals/Urinary Tract: No adrenal gland nodules. Nephrograms are symmetrical. No hydronephrosis or hydroureter. Renal cysts, largest on the left lower pole measuring 4.6 cm diameter. No change since prior study. No imaging follow-up is indicated. Bladder wall is thickened, possibly due to cystitis or  outlet obstruction. Correlate with urinalysis. Stomach/Bowel: Diffuse gastric wall thickening may be due to under distention or gastritis. Similar appearance to prior study. No small or large bowel distention. Postoperative changes in the cecum with apparent partial resection and anastomosis. 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 No change since prior study. Calcification of the aorta and iliac arteries. No dissection. Reproductive: Prostate gland is mildly enlarged. Other: No free air or free fluid in the abdomen. Abdominal wall musculature appears intact. Musculoskeletal: Degenerative changes in the spine and hips. Sternotomy wires. IMPRESSION: 1. Small bilateral pleural effusions with basilar atelectasis. 2. Cholelithiasis with layering small stones in the gallbladder. Gallbladder distention. No discrete inflammatory infiltration. 3. 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 Recommend follow-up ultrasound every 5 years. (Ref.: J Vasc Surg. 2018; 67:2-77 and J Am Coll  Radiol 2013;10(10):789-794.) 4. Diffuse gastric wall thickening may be due to under distention or gastritis. 5. Bladder wall thickening may be due to outlet obstruction or cystitis. Correlate with urinalysis. 6. Prostate gland is enlarged. Electronically Signed   By: Burman Nieves M.D.   On: 12/29/2022 21:48    Procedures Procedures    Medications Ordered in ED Medications  0.9 %  sodium chloride infusion (0 mLs Intravenous Stopped 12/29/22 2105)  0.9 % NaCl with KCl 20 mEq/ L  infusion ( Intravenous New Bag/Given 12/29/22 2105)  cephALEXin (KEFLEX) capsule 500 mg (has no administration in time range)  potassium chloride SA (KLOR-CON M) CR tablet 40 mEq (40 mEq Oral Given 12/29/22 1949)  iohexol (OMNIPAQUE) 350 MG/ML injection 75 mL (75 mLs Intravenous Contrast Given 12/29/22 2030)    ED Course/ Medical Decision Making/ A&P                                  Medical Decision Making Elderly male presents from home with lower abdominal pain.  Given his description of loose stool, though his caregiver disagrees, with abdominal pain, GI pathology including colitis, diverticulitis, urinary tract infection all considered. Patient's initial vital signs are reassuring. Cardiac 80 sinus normal Pulse ox 100% room air normal   Amount and/or Complexity of Data Reviewed Independent Historian: caregiver External Data Reviewed: notes.    Details: DC summary from last week reviewed Labs: ordered. Decision-making details documented in ED Course. Radiology: ordered and independent interpretation performed. Decision-making details documented in ED Course.  Risk Prescription drug management. Decision regarding hospitalization. Diagnosis or treatment significantly limited by social determinants of health.  Update: Patient now accompanied by his brother, we discussed findings thus far 10:15 PM Patient accompanied by brother, and 2 caregivers.  We discussed findings today, in context of recent hospitalization for sepsis, A-fib, pneumonia. Patient has no evidence for A-fib, heart rate in the 70s, sinus.  He has no evidence for decompensated state/bacteremia/sepsis with no leukocytosis, no fever.  Some evidence for urinary tract infection given his lower abdominal tenderness, abnormal urinalysis, and history of prostate issues.  CT scan generally reassuring, consistent with possible cystitis.  There are gallstones, but he has no upper abdominal tenderness, no vomiting, no fever, no leukocytosis. Little evidence for acute cholecystitis. Patient will start antibiotics here complete his potassium repletion, continue close outpatient follow-up.        Final Clinical Impression(s) / ED Diagnoses Final diagnoses:  Lower abdominal pain  Lower urinary tract infectious disease  Hypokalemia     Gerhard Munch, MD 12/29/22 2215

## 2022-12-29 NOTE — ED Triage Notes (Signed)
Patient arrives from home for eval of lower abdominal pain, intermittent since his last admission to the hospital. Denies n/v/d. Endorses constipation but has been having normal bowel movements since yesterday.

## 2023-01-02 DIAGNOSIS — I10 Essential (primary) hypertension: Secondary | ICD-10-CM | POA: Diagnosis not present

## 2023-01-02 DIAGNOSIS — I509 Heart failure, unspecified: Secondary | ICD-10-CM | POA: Diagnosis not present

## 2023-01-02 DIAGNOSIS — L98419 Non-pressure chronic ulcer of buttock with unspecified severity: Secondary | ICD-10-CM | POA: Diagnosis not present

## 2023-01-02 DIAGNOSIS — E785 Hyperlipidemia, unspecified: Secondary | ICD-10-CM | POA: Diagnosis not present

## 2023-01-07 ENCOUNTER — Other Ambulatory Visit (HOSPITAL_COMMUNITY): Payer: Self-pay

## 2023-01-11 ENCOUNTER — Encounter: Payer: Self-pay | Admitting: Pharmacist

## 2023-01-11 NOTE — Progress Notes (Signed)
Pharmacy Quality Measure Review  This patient is appearing on a report for being at risk of failing the adherence measure for cholesterol (statin) medications this calendar year.   Medication: rosuvastatin  Last fill date: 11/02/2022 for 30 day supply  Looks like rosuvastatin was stopped 12/29/2022 by pharmacy technician but the only notes I see from 10/01 are from and ED visit and there is no mention of stopping rosuvastatin.   Will check with PCP about restarting due to CAD and history of CABG  Henrene Pastor, PharmD Clinical Pharmacist [facility]  Population Health [phone]   Addendum 01/14/2023 -  I called to speak with Mr. Febo about statin therapy and his granddaughter answered the phone. She said that Mr Robben was grieving his wife's death and they were trying to give him some space. She asked that I call Friday and speak to Mr. Covello brother who is his power of attorney. Reviewed records and looks like Mr Tat's wife passes away 09/11/22.  Will follow up about medications later this week.   Shiana Rappleye.

## 2023-01-12 NOTE — Progress Notes (Signed)
I do recommend restart of rosuvastatin.  Not sure of any specific reason for discontinuation, but please verify with patient.  Thanks.

## 2023-01-18 ENCOUNTER — Other Ambulatory Visit: Payer: Self-pay | Admitting: Family Medicine

## 2023-01-18 ENCOUNTER — Encounter: Payer: Self-pay | Admitting: Nurse Practitioner

## 2023-01-18 ENCOUNTER — Other Ambulatory Visit (HOSPITAL_COMMUNITY): Payer: Self-pay

## 2023-01-18 ENCOUNTER — Ambulatory Visit: Payer: PPO | Attending: Nurse Practitioner | Admitting: Nurse Practitioner

## 2023-01-18 VITALS — BP 92/64 | Ht 68.0 in | Wt 148.0 lb

## 2023-01-18 DIAGNOSIS — N390 Urinary tract infection, site not specified: Secondary | ICD-10-CM | POA: Diagnosis not present

## 2023-01-18 DIAGNOSIS — I480 Paroxysmal atrial fibrillation: Secondary | ICD-10-CM

## 2023-01-18 DIAGNOSIS — E785 Hyperlipidemia, unspecified: Secondary | ICD-10-CM | POA: Diagnosis not present

## 2023-01-18 DIAGNOSIS — I10 Essential (primary) hypertension: Secondary | ICD-10-CM | POA: Diagnosis not present

## 2023-01-18 DIAGNOSIS — Z86711 Personal history of pulmonary embolism: Secondary | ICD-10-CM

## 2023-01-18 DIAGNOSIS — I2510 Atherosclerotic heart disease of native coronary artery without angina pectoris: Secondary | ICD-10-CM

## 2023-01-18 DIAGNOSIS — I5022 Chronic systolic (congestive) heart failure: Secondary | ICD-10-CM

## 2023-01-18 DIAGNOSIS — Z86718 Personal history of other venous thrombosis and embolism: Secondary | ICD-10-CM

## 2023-01-18 NOTE — Progress Notes (Unsigned)
Office Visit    Patient Name: Bradley Oconnor Date of Encounter: 01/18/2023  Primary Care Provider:  [name], MD Primary Cardiologist:  Nicki Guadalajara, MD  Chief Complaint    82 year old male with a history of CAD s/p CABG was 4 in 1993 (LIMA-LAD, SVG-diagonal, SVG-OM, SVG-PDA), DES-RCA in 2002, PE/DVT, paroxysmal atrial fibrillation, chronic systolic heart failure, hypertension, hyperlipidemia, anemia, and GERD who presents for hospital follow-up related to heart failure and atrial fibrillation.  Past Medical History    Past Medical History:  Diagnosis Date   Blood transfusion without reported diagnosis    CAD (coronary artery disease)    GERD (gastroesophageal reflux disease)    Heart attack (HCC)    Hyperlipidemia    Hypertension 03/07/12   ECHO-WNL     08/12/11 Lexiscan MyoviewNo significant ischemia demonstrated Low risk scan There is a moderate sized dense scar in the LCX territoy unchanged from the prior study.. Post- stress EF is 40%.   Thyroid disease    Past Surgical History:  Procedure Laterality Date   APPENDECTOMY     CERVICAL SPINE SURGERY     titanium plate in the back of neck   CORONARY ARTERY BYPASS GRAFT     HERNIA REPAIR     SMALL INTESTINE SURGERY     THYROID SURGERY     1/2 thyroid removed on right side    Allergies  Allergies  Allergen Reactions   Procardia [Nifedipine] Other (See Comments)    Hypotension    Phenergan [Promethazine Hcl] Nausea And Vomiting     Labs/Other Studies Reviewed    The following studies were reviewed today:  Cardiac Studies & Procedures       ECHOCARDIOGRAM  ECHOCARDIOGRAM COMPLETE 12/10/2022  Narrative ECHOCARDIOGRAM REPORT    Patient Name:   Bradley Oconnor Date of Exam: 12/10/2022 Medical Rec #:  [id number]        Height:       68.0 in Accession #:    [id number]       Weight:       153.4 lb Date of Birth:  11/07/1940        BSA:          1.826 m Patient Age:    82 years         BP:            88/49 mmHg Patient Gender: M                HR:           102 bpm. Exam Location:  Inpatient  Procedure: 2D Echo, Cardiac Doppler and Color Doppler  Indications:    Pulmonary Embolus I26.09  History:        Patient has prior history of Echocardiogram examinations, most recent 07/07/2016. CAD; Risk Factors:Hypertension and Dyslipidemia.  Sonographer:    Lucendia Herrlich Referring Phys: [id number] Jonny Ruiz D PAYNE  IMPRESSIONS   1. Left ventricular ejection fraction, by estimation, is 35 to 40%. The left ventricle has moderately decreased function. The left ventricle demonstrates global hypokinesis. Left ventricular diastolic parameters are indeterminate. 2. Right ventricular systolic function is moderately reduced. The right ventricular size is moderately enlarged. There is moderately elevated pulmonary artery systolic pressure. The estimated right ventricular systolic pressure is 51.0 mmHg. 3. Left atrial size was severely dilated. 4. Right atrial size was mildly dilated. 5. The mitral valve is normal in structure. Moderate to severe mitral valve regurgitation. No evidence of mitral stenosis. 6.  The aortic valve is tricuspid. There is mild calcification of the aortic valve. Aortic valve regurgitation is mild to moderate. No aortic stenosis is present. Aortic regurgitation PHT measures 469 msec. 7. Aortic dilatation noted. There is mild dilatation of the ascending aorta, measuring 40 mm. 8. The inferior vena cava is dilated in size with <50% respiratory variability, suggesting right atrial pressure of 15 mmHg.  FINDINGS Left Ventricle: Left ventricular ejection fraction, by estimation, is 35 to 40%. The left ventricle has moderately decreased function. The left ventricle demonstrates global hypokinesis. The left ventricular internal cavity size was normal in size. There is no left ventricular hypertrophy. Left ventricular diastolic parameters are indeterminate.  Right Ventricle: The right  ventricular size is moderately enlarged. No increase in right ventricular wall thickness. Right ventricular systolic function is moderately reduced. There is moderately elevated pulmonary artery systolic pressure. The tricuspid regurgitant velocity is 3.00 m/s, and with an assumed right atrial pressure of 15 mmHg, the estimated right ventricular systolic pressure is 51.0 mmHg.  Left Atrium: Left atrial size was severely dilated.  Right Atrium: Right atrial size was mildly dilated.  Pericardium: There is no evidence of pericardial effusion.  Mitral Valve: The mitral valve is normal in structure. Moderate to severe mitral valve regurgitation. No evidence of mitral valve stenosis.  Tricuspid Valve: The tricuspid valve is normal in structure. Tricuspid valve regurgitation is mild . No evidence of tricuspid stenosis.  Aortic Valve: The aortic valve is tricuspid. There is mild calcification of the aortic valve. Aortic valve regurgitation is mild to moderate. Aortic regurgitation PHT measures 469 msec. No aortic stenosis is present. Aortic valve peak gradient measures 5.1 mmHg.  Pulmonic Valve: The pulmonic valve was normal in structure. Pulmonic valve regurgitation is trivial. No evidence of pulmonic stenosis.  Aorta: Aortic dilatation noted. There is mild dilatation of the ascending aorta, measuring 40 mm.  Venous: The inferior vena cava is dilated in size with less than 50% respiratory variability, suggesting right atrial pressure of 15 mmHg.  IAS/Shunts: No atrial level shunt detected by color flow Doppler.   LEFT VENTRICLE PLAX 2D LVIDd:         5.50 cm      Diastology LVIDs:         4.50 cm      LV e' medial:    9.95 cm/s LV PW:         0.80 cm      LV E/e' medial:  9.0 LV IVS:        0.80 cm      LV e' lateral:   23.60 cm/s LVOT diam:     2.20 cm      LV E/e' lateral: 3.8 LV SV:         54 LV SV Index:   29 LVOT Area:     3.80 cm  LV Volumes (MOD) LV vol d, MOD A2C: 118.0 ml LV  vol d, MOD A4C: 103.0 ml LV vol s, MOD A2C: 51.4 ml LV vol s, MOD A4C: 59.8 ml LV SV MOD A2C:     66.6 ml LV SV MOD A4C:     103.0 ml LV SV MOD BP:      49.8 ml  RIGHT VENTRICLE             IVC RV S prime:     14.10 cm/s  IVC diam: 2.60 cm TAPSE (M-mode): 1.3 cm  LEFT ATRIUM  Index        RIGHT ATRIUM           Index LA diam:        4.70 cm  2.57 cm/m   RA Area:     15.30 cm LA Vol (A2C):   84.2 ml  46.11 ml/m  RA Volume:   32.50 ml  17.80 ml/m LA Vol (A4C):   103.0 ml 56.41 ml/m LA Biplane Vol: 94.5 ml  51.75 ml/m AORTIC VALVE                 PULMONIC VALVE AV Area (Vmax): 3.08 cm     PR End Diast Vel: 9.24 msec AV Vmax:        113.00 cm/s AV Peak Grad:   5.1 mmHg LVOT Vmax:      91.57 cm/s LVOT Vmean:     60.667 cm/s LVOT VTI:       0.141 m AI PHT:         469 msec  AORTA Ao Root diam: 3.60 cm Ao Asc diam:  4.00 cm  MITRAL VALVE               TRICUSPID VALVE MV Area (PHT): 4.80 cm    TR Peak grad:   36.0 mmHg MV Decel Time: 158 msec    TR Vmax:        300.00 cm/s MR Peak grad: 73.2 mmHg MR Vmax:      427.67 cm/s  SHUNTS MV E velocity: 90.00 cm/s  Systemic VTI:  0.14 m Systemic Diam: 2.20 cm  Arvilla Meres MD Electronically signed by Arvilla Meres MD Signature Date/Time: 12/10/2022/1:09:44 PM    Final            Recent Labs: 12/18/2022: TSH 2.819 12/19/2022: B Natriuretic Peptide 719.9 12/21/2022: Magnesium 1.9 12/29/2022: ALT 21; BUN 19; Creatinine, Ser 1.05; Hemoglobin 11.8; Platelets 262; Potassium 2.7; Sodium 137  Recent Lipid Panel    Component Value Date/Time   CHOL 132 03/03/2021 1214   CHOL 141 06/18/2017 1233   TRIG 124.0 03/03/2021 1214   HDL 42.50 03/03/2021 1214   HDL 44 06/18/2017 1233   CHOLHDL 3 03/03/2021 1214   VLDL 24.8 03/03/2021 1214   LDLCALC 65 03/03/2021 1214   LDLCALC 70 06/18/2017 1233    History of Present Illness    82 year old male with the above past medical history including CAD, PE/DVT, paroxysmal  atrial fibrillation, chronic systolic heart failure, hypertension, hyperlipidemia, anemia, and GERD.  He has a history of extensive CAD as above.  He was hospitalized in June 2011 in the setting of NSTEMI which was felt to be due to RCA graft occlusion which supplied the PDA and PLA vessels.  The PDA was extensively collateralized via the left circumflex territory, native RCA was totally occluded at the mid level, LIMA graft was widely patent as well as the SVG to diagonal and OM.  Medical management was advised.  He was last seen in the office on 07/16/2021 and was stable from a cardiac standpoint.  He denied symptoms concerning for angina. He was hospitalized in September 2024 in the setting of sepsis due to pneumonia, PE, DVT, acute systolic heart failure, and atrial fibrillation with RVR.  Cardiology was consulted.  He was loaded with amiodarone and converted to sinus rhythm.  He was started on Eliquis.  It was noted that amiodarone could likely be discontinued after 1 month following recovery from acute illness.  Echocardiogram during his hospitalization showed EF  35 to 40%, moderately decreased LV function, LV global hypokinesis, moderately reduced RV, moderately elevated PASP, moderate to severe mitral valve regurgitation, mild to moderate aortic valve regurgitation, mild dilation of the ascending aorta measuring 40 mm.  He was diuresed with IV Lasix.  Further escalation of GDMT was limited in the setting of hypotension.  It was noted that should his EF remain reduced following recovery from acute illness, outpatient ischemic evaluation could be considered.  He was also noted to have acute pulmonary emboli, right lower extremity DVT as well as acute metabolic encephalopathy, delirium, EEG was nonacute.  CT of the head without acute finding.  Additional hospital findings included protein calorie malnutrition, failure to thrive, AKI.  Lexapro was discontinued in the setting of prolonged QTc.  He was discharged  home in stable condition on 12/21/2022.  He was seen in the ED on 12/29/2022 in the setting of lower abdominal pain.  He was treated for a UTI.  He presents today for follow-up accompanied by his brother, family member, and caregiver.  Since his last visit and recent hospitalization he has stable from a cardiac standpoint.  He notes generalized weakness, fatigue, shortness of breath with activity.  He denies chest pain, palpitations, edema, PND, orthopnea, weight gain.  He is not able to stand and has not walked since his hospitalization.  He continues to work with physical therapy and currently has 24-hour home care.  His BP remains low.   Home Medications    Current Outpatient Medications  Medication Sig Dispense Refill   acetaminophen (TYLENOL) 325 MG tablet Take 650 mg by mouth every 6 (six) hours as needed.     amiodarone (PACERONE) 200 MG tablet Take 1 tablet (200 mg total) by mouth daily. 30 tablet 0   apixaban (ELIQUIS) 5 MG TABS tablet Take 1 tablet (5 mg total) by mouth 2 (two) times daily. 60 tablet 0   cyanocobalamin (VITAMIN B12) 1000 MCG tablet Take 1 tablet (1,000 mcg total) by mouth daily. 90 tablet 0   folic acid (FOLVITE) 1 MG tablet Take 1 tablet (1 mg total) by mouth daily.     Melatonin 10 MG TABS Take 10 mg by mouth at bedtime as needed.     mirtazapine (REMERON SOL-TAB) 15 MG disintegrating tablet Take 1 tablet (15 mg total) by mouth at bedtime. 30 tablet 0   Multiple Vitamins-Minerals (MULTIVITAMIN GUMMIES MENS) CHEW Chew 1 each by mouth daily.     psyllium (METAMUCIL) 58.6 % packet Take 1 packet by mouth daily.     ibuprofen (ADVIL) 200 MG tablet Take 200 mg by mouth daily as needed for headache or moderate pain. (Patient not taking: Reported on 01/18/2023)     levothyroxine (SYNTHROID) 100 MCG tablet TAKE 1 TABLET BY MOUTH ONCE DAILY IN THE MORNING -  NEED  OFFICE  VISIT  WITH  DR.  Tresa Endo  FOR  FUTURE  REFILLS (Patient not taking: Reported on 01/18/2023) 90 tablet 0   magic  mouthwash w/lidocaine SOLN Take 5 mLs by mouth 4 (four) times daily. (Patient not taking: Reported on 01/18/2023) 300 mL 0   Sennosides 25 MG TABS Take 25-75 mg by mouth daily. (Patient not taking: Reported on 01/18/2023)     torsemide (DEMADEX) 20 MG tablet Take 1 tablet (20 mg total) by mouth daily. (Patient not taking: Reported on 01/18/2023) 30 tablet 0   No current facility-administered medications for this visit.     Review of Systems    He denies chest  pain, palpitations, pnd, orthopnea, n, v, dizziness, syncope, edema, weight gain, or early satiety. All other systems reviewed and are otherwise negative except as noted above.   Physical Exam    VS:  BP 92/64   Ht 5\' 8"  (1.727 m)   Wt 148 lb (67.1 kg)   BMI 22.50 kg/m  GEN: Well nourished, well developed, in no acute distress. HEENT: normal. Neck: Supple, no JVD, carotid bruits, or masses. Cardiac: RRR, no murmurs, rubs, or gallops. No clubbing, cyanosis, edema.  Radials/DP/PT 2+ and equal bilaterally.  Respiratory:  Respirations regular and unlabored, clear to auscultation bilaterally. GI: Soft, nontender, nondistended, BS + x 4. MS: no deformity or atrophy. Skin: warm and dry, no rash. Neuro:  Strength and sensation are intact. Psych: Normal affect.  Accessory Clinical Findings    ECG personally reviewed by me today - EKG Interpretation Date/Time:  Monday January 18 2023 11:21:49 EDT Ventricular Rate:  86 PR Interval:  156 QRS Duration:  92 QT Interval:  484 QTC Calculation: 579 R Axis:   -17  Text Interpretation: Long QTc Normal sinus rhythm Interpretation limited secondary to artifact Left ventricular hypertrophy with repolarization abnormality ( R in aVL ) Confirmed by Bernadene Person ([id number]) on 01/18/2023 11:26:08 AM    - no acute changes.   Lab Results  Component Value Date   WBC 5.9 12/29/2022   HGB 11.8 (L) 12/29/2022   HCT 36.3 (L) 12/29/2022   MCV 98.4 12/29/2022   PLT 262 12/29/2022   Lab Results   Component Value Date   CREATININE 1.05 12/29/2022   BUN 19 12/29/2022   NA 137 12/29/2022   K 2.7 (LL) 12/29/2022   CL 95 (L) 12/29/2022   CO2 28 12/29/2022   Lab Results  Component Value Date   ALT 21 12/29/2022   AST 34 12/29/2022   ALKPHOS 85 12/29/2022   BILITOT 1.6 (H) 12/29/2022   Lab Results  Component Value Date   CHOL 132 03/03/2021   HDL 42.50 03/03/2021   LDLCALC 65 03/03/2021   TRIG 124.0 03/03/2021   CHOLHDL 3 03/03/2021    Lab Results  Component Value Date   HGBA1C 6.2 03/03/2021    Assessment & Plan    1. CAD:  S/p CABG x 4 in 1993 (LIMA-LAD, SVG-diagonal, SVG-OM, SVG-PDA), DES-RCA in 2002. S/p NSTEMI in 2011 which was felt to be due to RCA graft occlusion which supplied the PDA and PLA vessels.  The PDA was extensively collateralized via the left circumflex territory, native RCA was totally occluded at the mid level, LIMA graft was widely patent as well as the SVG to diagonal and OM. Medical management was advised. Echo during recent hospitalization showed EF 35 to 40%, moderately decreased LV function, LV global hypokinesis, moderately reduced RV, moderately elevated PASP, moderate to severe mitral valve regurgitation, mild to moderate aortic valve regurgitation, mild dilation of the ascending aorta measuring 40 mm. It was noted that should his EF remain reduced following recovery from acute illness, outpatient ischemic evaluation could be considered. Stable with no anginal symptoms. Repeat echo pending as below. No ASA in the setting of chronic DOAC therapy.    2. Chronic systolic heart failure: Echo during recent hospitalization the setting of sepsis showed reduced EF as above.  Diuresed with IV Lasix.  Euvolemic and well compensated on exam in office today.  Will change torsemide to as needed use in the setting of hypotension. Escalation of GDMT limited in the setting of significant hypotension.  Continue  to monitor BP and report SBP consistently less than 90.   Will check CBC, BMET today.  Will schedule repeat echocardiogram for 1 to 2 months.  3. Paroxysmal atrial fibrillation: Diagnosed during recent hospitalization in the setting of sepsis. EKG with significant artifact, difficult to interpret, however, he is maintaining sinus rhythm. Given concern for prolonged QTc, will discontinue amiodarone.  Reviewed ED precautions. Continue Eliquis.  4. History of PE/DVT: Diagnosed during recent hospitalization.  On Eliquis.   5. Hypertension: Now with significant hypotension.  Will change torsemide to as needed use as above.  Will discontinue amiodarone as above.  Not currently on any other antihypertensive therapy.  6. Hyperlipidemia: No recent LDL on file. On Crestor. Managed per PCP.    7. Disposition: Follow-up next available with  Dr. Tresa Endo or 2-3 months with APP.       Joylene Grapes, NP 01/18/2023, 12:01 PM

## 2023-01-18 NOTE — Patient Instructions (Signed)
Medication Instructions:  Stop Amiodarone as directed. Torsemide 20 mg daily as needed for swelling, weight gain of 3 lb over night or 5 lb in 1 week or shortness of breath.   *If you need a refill on your cardiac medications before your next appointment, please call your pharmacy*   Lab Work: BMET & CBC today  Testing/Procedures: Your physician has requested that you have an echocardiogram in 1-2 months. Echocardiography is a painless test that uses sound waves to create images of your heart. It provides your doctor with information about the size and shape of your heart and how well your heart's chambers and valves are working. This procedure takes approximately one hour. There are no restrictions for this procedure. Please do NOT wear cologne, perfume, aftershave, or lotions (deodorant is allowed). Please arrive 15 minutes prior to your appointment time.    Follow-Up: At [facility], you and your health needs are our priority.  As part of our continuing mission to provide you with exceptional heart care, we have created designated Provider Care Teams.  These Care Teams include your primary Cardiologist (physician) and Advanced Practice Providers (APPs -  Physician Assistants and Nurse Practitioners) who all work together to provide you with the care you need, when you need it.  We recommend signing up for the patient portal called "MyChart".  Sign up information is provided on this After Visit Summary.  MyChart is used to connect with patients for Virtual Visits (Telemedicine).  Patients are able to view lab/test results, encounter notes, upcoming appointments, etc.  Non-urgent messages can be sent to your provider as well.   To learn more about what you can do with MyChart, go to [email or web address].    Your next appointment:    Next available with Dr. Tresa Endo or 2-3 months with any APP  Provider:   Nicki Guadalajara, MD  or any APP

## 2023-01-19 ENCOUNTER — Other Ambulatory Visit: Payer: Self-pay

## 2023-01-19 ENCOUNTER — Other Ambulatory Visit (HOSPITAL_COMMUNITY): Payer: Self-pay

## 2023-01-19 ENCOUNTER — Encounter: Payer: Self-pay | Admitting: Nurse Practitioner

## 2023-01-19 LAB — BASIC METABOLIC PANEL
BUN/Creatinine Ratio: 20 (ref 10–24)
BUN: 19 mg/dL (ref 8–27)
CO2: 25 mmol/L (ref 20–29)
Calcium: 9 mg/dL (ref 8.6–10.2)
Chloride: 97 mmol/L (ref 96–106)
Creatinine, Ser: 0.94 mg/dL (ref 0.76–1.27)
Glucose: 141 mg/dL — ABNORMAL HIGH (ref 70–99)
Potassium: 3.4 mmol/L — ABNORMAL LOW (ref 3.5–5.2)
Sodium: 143 mmol/L (ref 134–144)
eGFR: 80 mL/min/{1.73_m2} (ref >59)

## 2023-01-19 LAB — CBC
Hematocrit: 36 % — ABNORMAL LOW (ref 37.5–51.0)
Hemoglobin: 11.4 g/dL — ABNORMAL LOW (ref 13.0–17.7)
MCH: 31.3 pg (ref 26.6–33.0)
MCHC: 31.7 g/dL (ref 31.5–35.7)
MCV: 99 fL — ABNORMAL HIGH (ref 79–97)
Platelets: 274 10*3/uL (ref 150–450)
RBC: 3.64 x10E6/uL — ABNORMAL LOW (ref 4.14–5.80)
RDW: 15.4 % (ref 11.6–15.4)
WBC: 6.8 10*3/uL (ref 3.4–10.8)

## 2023-01-19 MED ORDER — VITAMIN B-12 1000 MCG PO TABS
1000.0000 ug | ORAL_TABLET | Freq: Every day | ORAL | 0 refills | Status: DC
  Filled 2023-01-19: qty 90, 90d supply, fill #0

## 2023-01-19 MED ORDER — TORSEMIDE 20 MG PO TABS
20.0000 mg | ORAL_TABLET | Freq: Every day | ORAL | 0 refills | Status: DC
  Filled 2023-01-19: qty 30, 30d supply, fill #0

## 2023-01-19 MED ORDER — MIRTAZAPINE 15 MG PO TBDP
15.0000 mg | ORAL_TABLET | Freq: Every day | ORAL | 0 refills | Status: DC
Start: 2023-01-19 — End: 2023-03-01
  Filled 2023-01-19: qty 30, 30d supply, fill #0

## 2023-01-19 MED ORDER — APIXABAN 5 MG PO TABS
5.0000 mg | ORAL_TABLET | Freq: Two times a day (BID) | ORAL | 0 refills | Status: DC
  Filled 2023-01-19: qty 60, 30d supply, fill #0

## 2023-01-19 NOTE — Telephone Encounter (Signed)
Chart reviewed, recently hospitalized, has not yet had follow-up visit with me.  I do see that he had a visit with cardiology yesterday and they stopped amiodarone, continued on torsemide 20 mg daily if needed for swelling.    I will refill meds temporarily but needs hospital follow-up visit and med review visit with me in the next week or 2 if possible.

## 2023-01-19 NOTE — Telephone Encounter (Signed)
Wife is listed on pt's DPR and her phone number was starred as the preferred number to call. Called wife to schedule HFU. Young male answered her phone and was very rude. I was very polite when I called and asked to speak to pt's wife. He asked me why I was looking for her so I told him where I was calling from. He informed me that she passed away 4 months ago and told me next time I need to work on the way I call people and hung up. Appt was not successfully scheduled as I don't feel comfortable calling back to get fussed at again.

## 2023-01-20 ENCOUNTER — Telehealth: Payer: Self-pay

## 2023-01-20 NOTE — Telephone Encounter (Signed)
Lmom to discuss lab results. Waiting on a return call.  

## 2023-01-21 ENCOUNTER — Other Ambulatory Visit (HOSPITAL_COMMUNITY): Payer: Self-pay

## 2023-01-22 ENCOUNTER — Other Ambulatory Visit (HOSPITAL_COMMUNITY): Payer: Self-pay

## 2023-01-22 ENCOUNTER — Other Ambulatory Visit: Payer: Self-pay | Admitting: Pharmacist

## 2023-01-22 NOTE — Telephone Encounter (Signed)
Spoke with pts brother. He was notified of pts lab results. Pt will continue current medication and f/u as planned.

## 2023-01-22 NOTE — Progress Notes (Signed)
Spoke with patient's granddaughter last week and she asked if I would call back to speak with patient's brother because he was assisting with medications. Spoke with [employer] today. He was not aware of any side effects or instructions for patient to stop rosuvastatin. He requested updated Rx be sent to [facility].   Will request Rx to PCP

## 2023-01-22 NOTE — Telephone Encounter (Signed)
Lmom to discuss lab results. Waiting on a return call.  

## 2023-01-23 MED ORDER — ROSUVASTATIN CALCIUM 20 MG PO TABS: 10.0000 mg | ORAL_TABLET | Freq: Every day | ORAL | 3 refills | Status: DC

## 2023-01-26 ENCOUNTER — Emergency Department (HOSPITAL_COMMUNITY): Payer: PPO

## 2023-01-26 ENCOUNTER — Encounter (HOSPITAL_COMMUNITY): Payer: Self-pay

## 2023-01-26 ENCOUNTER — Observation Stay (HOSPITAL_COMMUNITY)
Admission: EM | Admit: 2023-01-26 | Discharge: 2023-01-30 | Disposition: A | Discharge status: Home / Self Care | Payer: PPO | Attending: Internal Medicine | Admitting: Internal Medicine

## 2023-01-26 ENCOUNTER — Other Ambulatory Visit: Payer: Self-pay

## 2023-01-26 DIAGNOSIS — R1011 Right upper quadrant pain: Secondary | ICD-10-CM | POA: Diagnosis not present

## 2023-01-26 DIAGNOSIS — R1030 Lower abdominal pain, unspecified: Principal | ICD-10-CM

## 2023-01-26 DIAGNOSIS — Z7901 Long term (current) use of anticoagulants: Secondary | ICD-10-CM | POA: Diagnosis not present

## 2023-01-26 DIAGNOSIS — I5042 Chronic combined systolic (congestive) and diastolic (congestive) heart failure: Secondary | ICD-10-CM | POA: Diagnosis present

## 2023-01-26 DIAGNOSIS — I10 Essential (primary) hypertension: Secondary | ICD-10-CM | POA: Diagnosis present

## 2023-01-26 DIAGNOSIS — E039 Hypothyroidism, unspecified: Secondary | ICD-10-CM | POA: Diagnosis not present

## 2023-01-26 DIAGNOSIS — K810 Acute cholecystitis: Principal | ICD-10-CM | POA: Insufficient documentation

## 2023-01-26 DIAGNOSIS — I25709 Atherosclerosis of coronary artery bypass graft(s), unspecified, with unspecified angina pectoris: Secondary | ICD-10-CM | POA: Diagnosis not present

## 2023-01-26 DIAGNOSIS — I4819 Other persistent atrial fibrillation: Secondary | ICD-10-CM | POA: Diagnosis not present

## 2023-01-26 DIAGNOSIS — E785 Hyperlipidemia, unspecified: Secondary | ICD-10-CM | POA: Diagnosis present

## 2023-01-26 DIAGNOSIS — Z86711 Personal history of pulmonary embolism: Secondary | ICD-10-CM | POA: Insufficient documentation

## 2023-01-26 DIAGNOSIS — Z79899 Other long term (current) drug therapy: Secondary | ICD-10-CM | POA: Insufficient documentation

## 2023-01-26 DIAGNOSIS — Z951 Presence of aortocoronary bypass graft: Secondary | ICD-10-CM | POA: Diagnosis not present

## 2023-01-26 DIAGNOSIS — I2510 Atherosclerotic heart disease of native coronary artery without angina pectoris: Secondary | ICD-10-CM | POA: Insufficient documentation

## 2023-01-26 DIAGNOSIS — K828 Other specified diseases of gallbladder: Secondary | ICD-10-CM | POA: Diagnosis not present

## 2023-01-26 DIAGNOSIS — K8020 Calculus of gallbladder without cholecystitis without obstruction: Secondary | ICD-10-CM | POA: Diagnosis not present

## 2023-01-26 DIAGNOSIS — K219 Gastro-esophageal reflux disease without esophagitis: Secondary | ICD-10-CM | POA: Diagnosis not present

## 2023-01-26 DIAGNOSIS — R9431 Abnormal electrocardiogram [ECG] [EKG]: Secondary | ICD-10-CM | POA: Diagnosis present

## 2023-01-26 DIAGNOSIS — I110 Hypertensive heart disease with heart failure: Secondary | ICD-10-CM | POA: Diagnosis not present

## 2023-01-26 DIAGNOSIS — Z23 Encounter for immunization: Secondary | ICD-10-CM | POA: Diagnosis not present

## 2023-01-26 DIAGNOSIS — E876 Hypokalemia: Secondary | ICD-10-CM | POA: Diagnosis not present

## 2023-01-26 DIAGNOSIS — R1032 Left lower quadrant pain: Secondary | ICD-10-CM | POA: Diagnosis not present

## 2023-01-26 DIAGNOSIS — I25810 Atherosclerosis of coronary artery bypass graft(s) without angina pectoris: Secondary | ICD-10-CM | POA: Diagnosis present

## 2023-01-26 LAB — CBC WITH DIFFERENTIAL/PLATELET
Abs Immature Granulocytes: 0.01 10*3/uL (ref 0.00–0.07)
Basophils Absolute: 0 10*3/uL (ref 0.0–0.1)
Basophils Relative: 1 %
Eosinophils Absolute: 0.2 10*3/uL (ref 0.0–0.5)
Eosinophils Relative: 3 %
HCT: 35.8 % — ABNORMAL LOW (ref 39.0–52.0)
Hemoglobin: 11.6 g/dL — ABNORMAL LOW (ref 13.0–17.0)
Immature Granulocytes: 0 %
Lymphocytes Relative: 44 %
Lymphs Abs: 2.6 10*3/uL (ref 0.7–4.0)
MCH: 31.1 pg (ref 26.0–34.0)
MCHC: 32.4 g/dL (ref 30.0–36.0)
MCV: 96 fL (ref 80.0–100.0)
Monocytes Absolute: 0.4 10*3/uL (ref 0.1–1.0)
Monocytes Relative: 7 %
Neutro Abs: 2.7 10*3/uL (ref 1.7–7.7)
Neutrophils Relative %: 45 %
Platelets: 222 10*3/uL (ref 150–400)
RBC: 3.73 MIL/uL — ABNORMAL LOW (ref 4.22–5.81)
RDW: 16.6 % — ABNORMAL HIGH (ref 11.5–15.5)
WBC: 5.9 10*3/uL (ref 4.0–10.5)
nRBC: 0 % (ref 0.0–0.2)

## 2023-01-26 LAB — URINALYSIS, ROUTINE W REFLEX MICROSCOPIC
Bacteria, UA: NONE SEEN
Bilirubin Urine: NEGATIVE
Glucose, UA: NEGATIVE mg/dL
Hgb urine dipstick: NEGATIVE
Ketones, ur: NEGATIVE mg/dL
Nitrite: NEGATIVE
Protein, ur: NEGATIVE mg/dL
Specific Gravity, Urine: 1.006 (ref 1.005–1.030)
WBC, UA: >50 WBC/hpf (ref 0–5)
pH: 7 (ref 5.0–8.0)

## 2023-01-26 LAB — LIPASE, BLOOD: Lipase: 34 U/L (ref 11–51)

## 2023-01-26 LAB — COMPREHENSIVE METABOLIC PANEL
ALT: 15 U/L (ref 0–44)
AST: 22 U/L (ref 15–41)
Albumin: 2.7 g/dL — ABNORMAL LOW (ref 3.5–5.0)
Alkaline Phosphatase: 62 U/L (ref 38–126)
Anion gap: 13 (ref 5–15)
BUN: 8 mg/dL (ref 8–23)
CO2: 25 mmol/L (ref 22–32)
Calcium: 8.2 mg/dL — ABNORMAL LOW (ref 8.9–10.3)
Chloride: 104 mmol/L (ref 98–111)
Creatinine, Ser: 0.88 mg/dL (ref 0.61–1.24)
GFR, Estimated: >60 mL/min (ref >60)
Glucose, Bld: 127 mg/dL — ABNORMAL HIGH (ref 70–99)
Potassium: 2.9 mmol/L — ABNORMAL LOW (ref 3.5–5.1)
Sodium: 142 mmol/L (ref 135–145)
Total Bilirubin: 0.7 mg/dL (ref 0.3–1.2)
Total Protein: 5.9 g/dL — ABNORMAL LOW (ref 6.5–8.1)

## 2023-01-26 LAB — MAGNESIUM: Magnesium: 1.3 mg/dL — ABNORMAL LOW (ref 1.7–2.4)

## 2023-01-26 MED ORDER — POTASSIUM CHLORIDE 10 MEQ/100ML IV SOLN: 10.0000 meq | Freq: Once | INTRAVENOUS | Status: DC

## 2023-01-26 MED ORDER — LEVOTHYROXINE SODIUM 100 MCG PO TABS: 100.0000 ug | ORAL_TABLET | Freq: Every day | ORAL | Status: DC

## 2023-01-26 MED ORDER — MORPHINE SULFATE (PF) 4 MG/ML IV SOLN
4.0000 mg | Freq: Once | INTRAVENOUS | Status: AC
  Administered 2023-01-26: 4 mg via INTRAVENOUS
  Filled 2023-01-26: qty 1

## 2023-01-26 MED ORDER — ACETAMINOPHEN 325 MG PO TABS
650.0000 mg | ORAL_TABLET | Freq: Four times a day (QID) | ORAL | Status: DC | PRN
  Administered 2023-01-29: 650 mg via ORAL
  Filled 2023-01-26: qty 2

## 2023-01-26 MED ORDER — ROSUVASTATIN CALCIUM 5 MG PO TABS
10.0000 mg | ORAL_TABLET | Freq: Every day | ORAL | Status: DC
  Administered 2023-01-28 – 2023-01-30 (×3): 10 mg via ORAL
  Filled 2023-01-26 (×3): qty 2

## 2023-01-26 MED ORDER — INFLUENZA VAC A&B SURF ANT ADJ 0.5 ML IM SUSY
0.5000 mL | PREFILLED_SYRINGE | INTRAMUSCULAR | Status: AC
  Administered 2023-01-27: 0.5 mL via INTRAMUSCULAR
  Filled 2023-01-26: qty 0.5

## 2023-01-26 MED ORDER — ONDANSETRON HCL 4 MG/2ML IJ SOLN
4.0000 mg | Freq: Once | INTRAMUSCULAR | Status: AC
  Administered 2023-01-26: 4 mg via INTRAVENOUS
  Filled 2023-01-26: qty 2

## 2023-01-26 MED ORDER — MELATONIN 5 MG PO TABS
10.0000 mg | ORAL_TABLET | Freq: Every evening | ORAL | Status: DC | PRN
  Administered 2023-01-28: 10 mg via ORAL
  Filled 2023-01-26: qty 2

## 2023-01-26 MED ORDER — FENTANYL CITRATE PF 50 MCG/ML IJ SOSY
25.0000 ug | PREFILLED_SYRINGE | Freq: Once | INTRAMUSCULAR | Status: AC
  Administered 2023-01-26: 25 ug via INTRAVENOUS
  Filled 2023-01-26: qty 1

## 2023-01-26 MED ORDER — TORSEMIDE 20 MG PO TABS: 20.0000 mg | ORAL_TABLET | Freq: Every day | ORAL | Status: DC

## 2023-01-26 MED ORDER — LACTATED RINGERS IV BOLUS
1000.0000 mL | Freq: Once | INTRAVENOUS | Status: AC
  Administered 2023-01-26: 1000 mL via INTRAVENOUS

## 2023-01-26 MED ORDER — ACETAMINOPHEN 650 MG RE SUPP: 650.0000 mg | Freq: Four times a day (QID) | RECTAL | Status: DC | PRN

## 2023-01-26 MED ORDER — HYDROMORPHONE HCL 1 MG/ML IJ SOLN
0.5000 mg | INTRAMUSCULAR | Status: DC | PRN
  Administered 2023-01-26 – 2023-01-27 (×3): 0.5 mg via INTRAVENOUS
  Filled 2023-01-26 (×4): qty 0.5

## 2023-01-26 MED ORDER — POLYETHYLENE GLYCOL 3350 17 G PO PACK
17.0000 g | PACK | Freq: Every day | ORAL | Status: DC | PRN
Start: 2023-01-26 — End: 2023-01-30

## 2023-01-26 MED ORDER — POTASSIUM CHLORIDE 10 MEQ/100ML IV SOLN
10.0000 meq | INTRAVENOUS | Status: AC
  Administered 2023-01-26 (×3): 10 meq via INTRAVENOUS
  Filled 2023-01-26 (×3): qty 100

## 2023-01-26 MED ORDER — SODIUM CHLORIDE 0.9% FLUSH
3.0000 mL | Freq: Two times a day (BID) | INTRAVENOUS | Status: DC
  Administered 2023-01-26 – 2023-01-30 (×8): 3 mL via INTRAVENOUS

## 2023-01-26 NOTE — ED Provider Notes (Signed)
[place] EMERGENCY DEPARTMENT AT [facility] Provider Note   CSN: [id number] Arrival date & time: 01/26/23  1003     History {Add pertinent medical, surgical, social history, OB history to HPI:1} Chief Complaint  Patient presents with   Abdominal Pain    Bradley Oconnor is a 82 y.o. male.  HPI Patient presents with abdominal pain.  Pain is left mid lateral abdomen, has been waxing, waning for several days.  After her initial thoughts of constipation were addressed with prune juice, the patient has had bowel movements, but continues to have episodic pain.  No fever, no vomiting.  He has been taking his medication.  He arrives via EMS, they report no hemodynamic instability en route, acknowledging the patient typically has low blood pressure and had a systolic about 100 in transport.     Home Medications Prior to Admission medications   Medication Sig Start Date End Date Taking? Authorizing Provider  acetaminophen (TYLENOL) 325 MG tablet Take 650 mg by mouth every 6 (six) hours as needed.    [provider]  apixaban (ELIQUIS) 5 MG TABS tablet Take 1 tablet (5 mg total) by mouth 2 (two) times daily. 01/19/23   [name], MD  cyanocobalamin (VITAMIN B12) 1000 MCG tablet Take 1 tablet (1,000 mcg total) by mouth daily. 01/19/23   [name], MD  folic acid (FOLVITE) 1 MG tablet Take 1 tablet (1 mg total) by mouth daily. 12/22/22   Elgergawy, Leana Roe, MD  ibuprofen (ADVIL) 200 MG tablet Take 200 mg by mouth daily as needed for headache or moderate pain. Patient not taking: Reported on 01/18/2023    [provider]  levothyroxine (SYNTHROID) 100 MCG tablet TAKE 1 TABLET BY MOUTH ONCE DAILY IN THE MORNING -  NEED  OFFICE  VISIT  WITH  DR.  Tresa Endo  FOR  FUTURE  REFILLS Patient not taking: Reported on 01/18/2023 06/29/22   Lennette Bihari, MD  magic mouthwash w/lidocaine SOLN Take 5 mLs by mouth 4 (four) times daily. Patient not taking: Reported on  01/18/2023 12/21/22   Elgergawy, Leana Roe, MD  Melatonin 10 MG TABS Take 10 mg by mouth at bedtime as needed.    [provider]  mirtazapine (REMERON SOL-TAB) 15 MG disintegrating tablet Take 1 tablet (15 mg total) by mouth at bedtime. 01/19/23   [name], MD  Multiple Vitamins-Minerals (MULTIVITAMIN GUMMIES MENS) CHEW Chew 1 each by mouth daily.    [provider]  psyllium (METAMUCIL) 58.6 % packet Take 1 packet by mouth daily.    [provider]  rosuvastatin (CRESTOR) 20 MG tablet Take 0.5 tablets (10 mg total) by mouth daily. 01/23/23   [name], MD  Sennosides 25 MG TABS Take 25-75 mg by mouth daily. Patient not taking: Reported on 01/18/2023    [provider]  torsemide (DEMADEX) 20 MG tablet Take 1 tablet (20 mg total) by mouth daily. 01/19/23   [name], MD      Allergies    Procardia [nifedipine] and Phenergan [promethazine hcl]    Review of Systems   Review of Systems  Physical Exam Updated Vital Signs Pulse 70   Temp 98.6 F (37 C) (Oral)   Resp 18   Ht 5\' 8"  (1.727 m)   Wt 67 kg   SpO2 99%   BMI 22.46 kg/m  Physical Exam Vitals and nursing note reviewed.  Constitutional:      General: He is not  in acute distress.    Appearance: He is well-developed.  HENT:     Head: Normocephalic and atraumatic.  Eyes:     Conjunctiva/sclera: Conjunctivae normal.  Cardiovascular:     Rate and Rhythm: Normal rate.     Heart sounds: Normal heart sounds.  Pulmonary:     Effort: Pulmonary effort is normal. No respiratory distress.     Breath sounds: No stridor.  Abdominal:     General: There is no distension.     Tenderness: There is generalized abdominal tenderness. There is guarding.  Skin:    General: Skin is warm and dry.  Neurological:     Mental Status: He is alert and oriented to person, place, and time.     ED Results / Procedures / Treatments   Labs (all labs ordered are listed, but only abnormal  results are displayed) Labs Reviewed  COMPREHENSIVE METABOLIC PANEL  LIPASE, BLOOD  CBC WITH DIFFERENTIAL/PLATELET  URINALYSIS, ROUTINE W REFLEX MICROSCOPIC    EKG None  Radiology No results found.  Procedures Procedures  {Document cardiac monitor, telemetry assessment procedure when appropriate:1}  Medications Ordered in ED Medications  fentaNYL (SUBLIMAZE) injection 25 mcg (has no administration in time range)  ondansetron (ZOFRAN) injection 4 mg (has no administration in time range)    ED Course/ Medical Decision Making/ A&P   {   Click here for ABCD2, HEART and other calculatorsREFRESH Note before signing :1}                              Medical Decision Making Elderly male with history of multiple medical issues including kidney stones, kidney infection, hypertension presents with left-sided abdominal pain, inconsistent bowel movements.  Characteristics are suggestive of renal pathology, though history of bowel movement changes suggests GI etiology.  With this broad differential, patient had labs CT fentanyl Zofran monitoring.   Amount and/or Complexity of Data Reviewed Independent Historian: EMS External Data Reviewed: notes. Labs: ordered. Decision-making details documented in ED Course. Radiology: ordered and independent interpretation performed. Decision-making details documented in ED Course.  Risk Prescription drug management. Decision regarding hospitalization. Diagnosis or treatment significantly limited by social determinants of health.   ***  {Document critical care time when appropriate:1} {Document review of labs and clinical decision tools ie heart score, Chads2Vasc2 etc:1}  {Document your independent review of radiology images, and any outside records:1} {Document your discussion with family members, caretakers, and with consultants:1} {Document social determinants of health affecting pt's care:1} {Document your decision making why or why not  admission, treatments were needed:1} Final Clinical Impression(s) / ED Diagnoses Final diagnoses:  None    Rx / DC Orders ED Discharge Orders     None

## 2023-01-26 NOTE — ED Notes (Signed)
Patient transported to Ultrasound 

## 2023-01-26 NOTE — ED Triage Notes (Signed)
BIBM from home d/t L mid abd pain. Had same pain last week, was given OTC meds by his caregivers with no effect. Pain "coming in waves". Diarrhea after having metamucil and prune juice. B/P 100 palp HR 68 O2 98% RA CBG 156

## 2023-01-26 NOTE — H&P (Signed)
History and Physical   Bradley Oconnor:[id number] DOB: 12-Oct-1940 DOA: 01/26/2023  PCP: [name], MD   Patient coming from: Home  Chief Complaint: Abdominal pain  HPI: Bradley Oconnor is a 82 y.o. male with medical history significant of hypertension, hyperlipidemia, hypothyroidism, atrial fibrillation, QT prolongation, GERD, CAD status post CABG, chronic systolic CHF, presenting with several days of abdominal pain.  Patient reports some mid abdominal pain for the past 3 days that has been intermittent.  Initially he thought it was just constipation but was able to have some bowel movements after drinking prune juice but the intermittent pain has persisted.  Due to this called EMS to transport him to the ED.  Denies fevers, chills, chest pain, shortness of breath, constipation, diarrhea, nausea, vomiting.  ED Course: Vital signs in the ED notable for blood pressure in the 90s to 100 systolic which is his baseline.  Lab workup included CMP with potassium 2.9, glucose 127, calcium 8.2, protein 5.9, albumin 2.7.  CBC showed hemoglobin stable 11.6.  Lipase negative.  Urinalysis with leukocytes only.  CT of the abdomen pelvis showed small bilateral pleural effusions, cholelithiasis, mild gallbladder distention, CAD.  Right upper quadrant ultrasound showed distended gallbladder with layering stones and positive sonographic Murphy sign.  Patient received morphine, fentanyl, Zofran in the ED as well as 30 mEq IV potassium and 1 L IV fluids.  General surgery consulted and will see the patient, asking for medicine admission.  Review of Systems: As per HPI otherwise all other systems reviewed and are negative.  Past Medical History:  Diagnosis Date   Blood transfusion without reported diagnosis    CAD (coronary artery disease)    GERD (gastroesophageal reflux disease)    Heart attack (HCC)    Hyperlipidemia    Hypertension 03/07/2012   ECHO-WNL     08/12/11 Lexiscan MyoviewNo  significant ischemia demonstrated Low risk scan There is a moderate sized dense scar in the LCX territoy unchanged from the prior study.. Post- stress EF is 40%.   Septic shock (HCC) 12/10/2022   Thyroid disease     Past Surgical History:  Procedure Laterality Date   APPENDECTOMY     CERVICAL SPINE SURGERY     titanium plate in the back of neck   CORONARY ARTERY BYPASS GRAFT     HERNIA REPAIR     SMALL INTESTINE SURGERY     THYROID SURGERY     1/2 thyroid removed on right side    Social History  reports that he has never smoked. He has never used smokeless tobacco. He reports that he does not drink alcohol and does not use drugs.  Allergies  Allergen Reactions   Procardia [Nifedipine] Other (See Comments)    Hypotension    Phenergan [Promethazine Hcl] Nausea And Vomiting    Family History  Problem Relation Age of Onset   Heart disease Mother    Cancer Mother        breast, stomach   Heart disease Father    Hyperlipidemia Father    Heart disease Brother    Diabetes Brother    Hyperlipidemia Brother    Heart disease Paternal Grandfather    Healthy Brother   Reviewed on admission  Prior to Admission medications   Medication Sig Start Date End Date Taking? Authorizing Provider  acetaminophen (TYLENOL) 325 MG tablet Take 650 mg by mouth every 6 (six) hours as needed.    [provider]  apixaban (ELIQUIS) 5 MG TABS tablet  Take 1 tablet (5 mg total) by mouth 2 (two) times daily. 01/19/23   [name], MD  cyanocobalamin (VITAMIN B12) 1000 MCG tablet Take 1 tablet (1,000 mcg total) by mouth daily. 01/19/23   [name], MD  folic acid (FOLVITE) 1 MG tablet Take 1 tablet (1 mg total) by mouth daily. 12/22/22   Elgergawy, Leana Roe, MD  ibuprofen (ADVIL) 200 MG tablet Take 200 mg by mouth daily as needed for headache or moderate pain. Patient not taking: Reported on 01/18/2023    [provider]  levothyroxine (SYNTHROID) 100 MCG tablet TAKE 1  TABLET BY MOUTH ONCE DAILY IN THE MORNING -  NEED  OFFICE  VISIT  WITH  DR.  Tresa Endo  FOR  FUTURE  REFILLS Patient not taking: Reported on 01/18/2023 06/29/22   Lennette Bihari, MD  magic mouthwash w/lidocaine SOLN Take 5 mLs by mouth 4 (four) times daily. Patient not taking: Reported on 01/18/2023 12/21/22   Elgergawy, Leana Roe, MD  Melatonin 10 MG TABS Take 10 mg by mouth at bedtime as needed.    [provider]  mirtazapine (REMERON SOL-TAB) 15 MG disintegrating tablet Take 1 tablet (15 mg total) by mouth at bedtime. 01/19/23   [name], MD  Multiple Vitamins-Minerals (MULTIVITAMIN GUMMIES MENS) CHEW Chew 1 each by mouth daily.    [provider]  psyllium (METAMUCIL) 58.6 % packet Take 1 packet by mouth daily.    [provider]  rosuvastatin (CRESTOR) 20 MG tablet Take 0.5 tablets (10 mg total) by mouth daily. 01/23/23   [name], MD  Sennosides 25 MG TABS Take 25-75 mg by mouth daily. Patient not taking: Reported on 01/18/2023    [provider]  torsemide (DEMADEX) 20 MG tablet Take 1 tablet (20 mg total) by mouth daily. 01/19/23   [name], MD    Physical Exam: Vitals:   01/26/23 1330 01/26/23 1410 01/26/23 1430 01/26/23 1630  BP: (!) 106/58  (!) 101/59 101/70  Pulse: 61  65 62  Resp: 13  18 14   Temp:  98 F (36.7 C)    TempSrc:  Oral    SpO2: 100%  99% 100%  Weight:      Height:        Physical Exam Constitutional:      General: He is not in acute distress.    Appearance: Normal appearance.  HENT:     Head: Normocephalic and atraumatic.     Mouth/Throat:     Mouth: Mucous membranes are moist.     Pharynx: Oropharynx is clear.  Eyes:     Extraocular Movements: Extraocular movements intact.     Pupils: Pupils are equal, round, and reactive to light.  Cardiovascular:     Rate and Rhythm: Normal rate and regular rhythm.     Pulses: Normal pulses.     Heart sounds: Normal heart sounds.  Pulmonary:     Effort:  Pulmonary effort is normal. No respiratory distress.     Breath sounds: Normal breath sounds.  Abdominal:     General: Bowel sounds are normal. There is no distension.     Palpations: Abdomen is soft.     Tenderness: There is abdominal tenderness (Suprapubic and bilateral lower quadrants).  Musculoskeletal:        General: No swelling or deformity.  Skin:    General: Skin is warm and dry.  Neurological:     General: No focal deficit present.  Mental Status: Mental status is at baseline.     Labs on Admission: I have personally reviewed following labs and imaging studies  CBC: Recent Labs  Lab 01/26/23 1026  WBC 5.9  NEUTROABS 2.7  HGB 11.6*  HCT 35.8*  MCV 96.0  PLT 222    Basic Metabolic Panel: Recent Labs  Lab 01/26/23 1026  NA 142  K 2.9*  CL 104  CO2 25  GLUCOSE 127*  BUN 8  CREATININE 0.88  CALCIUM 8.2*    GFR: Estimated Creatinine Clearance: 60.3 mL/min (by C-G formula based on SCr of 0.88 mg/dL).  Liver Function Tests: Recent Labs  Lab 01/26/23 1026  AST 22  ALT 15  ALKPHOS 62  BILITOT 0.7  PROT 5.9*  ALBUMIN 2.7*    Urine analysis:    Component Value Date/Time   COLORURINE YELLOW 01/26/2023 1015   APPEARANCEUR CLEAR 01/26/2023 1015   LABSPEC 1.006 01/26/2023 1015   PHURINE 7.0 01/26/2023 1015   GLUCOSEU NEGATIVE 01/26/2023 1015   HGBUR NEGATIVE 01/26/2023 1015   BILIRUBINUR NEGATIVE 01/26/2023 1015   BILIRUBINUR small 04/17/2014 1515   KETONESUR NEGATIVE 01/26/2023 1015   PROTEINUR NEGATIVE 01/26/2023 1015   UROBILINOGEN 0.2 04/17/2014 1515   UROBILINOGEN 0.2 08/30/2009 1137   NITRITE NEGATIVE 01/26/2023 1015   LEUKOCYTESUR LARGE (A) 01/26/2023 1015    Radiological Exams on Admission: US Abdomen Limited RUQ (LIVER/GB)  Result Date: 01/26/2023 CLINICAL DATA:  Right upper quadrant abdominal pain EXAM: ULTRASOUND ABDOMEN LIMITED RIGHT UPPER QUADRANT COMPARISON:  None Available. FINDINGS: Gallbladder: Distended gallbladder.  Layering stones. No wall thickening visualized. Positive sonographic Murphy sign noted by sonographer. Common bile duct: Not well seen. Liver: No focal lesion identified. Within normal limits in parenchymal echogenicity. Portal vein is patent on color Doppler imaging with normal direction of blood flow towards the liver. Other: Partially imaged right pleural effusion. IMPRESSION: Distended gallbladder containing layering stones with positive sonographic Murphy's sign, suspicious for acute cholecystitis. Electronically Signed   By: Agustin Cree M.D.   On: 01/26/2023 14:53   CT ABDOMEN PELVIS WO CONTRAST  Result Date: 01/26/2023 CLINICAL DATA:  Left lower quadrant abdominal pain EXAM: CT ABDOMEN AND PELVIS WITHOUT CONTRAST TECHNIQUE: Multidetector CT imaging of the abdomen and pelvis was performed following the standard protocol without IV contrast. RADIATION DOSE REDUCTION: This exam was performed according to the departmental dose-optimization program which includes automated exposure control, adjustment of the mA and/or kV according to patient size and/or use of iterative reconstruction technique. COMPARISON:  12/29/2022 FINDINGS: Lower chest: Small bilateral pleural effusions, decreased since prior study. Minimal dependent atelectasis. Coronary artery and aortic atherosclerosis. Hepatobiliary: Layering stones within the gallbladder which is mildly distended. No biliary ductal dilatation. No visible ductal stones. Pancreas: No focal abnormality or ductal dilatation. Spleen: No focal abnormality.  Normal size. Adrenals/Urinary Tract: No adrenal abnormality. No focal renal abnormality. No stones or hydronephrosis. Urinary bladder is unremarkable. Stomach/Bowel: Stomach, large and small bowel grossly unremarkable. Vascular/Lymphatic: Diffuse aortoiliac atherosclerosis. No evidence of aneurysm or adenopathy. Reproductive: No visible focal abnormality. Other: No free fluid or free air. Musculoskeletal: No acute bony  abnormality. IMPRESSION: Small bilateral pleural effusions, decreased since prior study. Layering cholelithiasis. Mild distention of the gallbladder, slightly decreased since prior study. Coronary artery disease, aortic atherosclerosis. No acute findings. Electronically Signed   By: Charlett Nose M.D.   On: 01/26/2023 12:40    EKG: Not yet performed  Assessment/Plan Principal Problem:   Acute cholecystitis Active Problems:   CAD (coronary artery disease)  of artery bypass graft   Hypothyroidism   Hyperlipidemia with target LDL less than 70   GERD (gastroesophageal reflux disease)   HTN (hypertension)   Persistent atrial fibrillation (HCC)   Chronic combined systolic and diastolic CHF (congestive heart failure) (HCC)   Prolonged QT interval   Abdominal pain ?Symptomatic gallstones ?Urinary retention > Patient presenting with abdominal pain for the past several days that has been intermittent.  CT showed only cholelithiasis and mild gallbladder distention.  Similar findings from right upper quadrant but with layering stones and positive sonographic Eulah Pont sign was concerning for clinical acute cholecystitis, though imaging findings are not typical. > No leukocytosis, no other obvious wall thickening. > General Surgery consulted and will follow but asked for medicine admission.  > Patient received pain medication, Zofran, IV fluids in the ED. - Monitor on telemetry unit overnight - Appreciate general surgery recommendations and assistance - Continue with as needed pain medication - Holding Eliquis for potential surgery - Supportive care - Sips with meds ADDENDUM > Note from general surgery finalized after admission ordered > General Surgery did not feel his presentation is consistent with cholecystitis.  Symptomatic gallstones also unlikely. > On their exam pain is primarily suprapubic.  My exam also reflect suprapubic tenderness but also reaches out bilaterally towards the left and right  lower quadrants. > Review of CT scan does show full with not distended bladder.  We have been able to get a urine sample we will will check bladder scan to see if any of this is contributing to his tenderness. > Urinalysis showed only leukocytes. > CT ab pelvis was otherwise without acute abnormality. - Bladder scan - Resume Eliquis - Liquid diet, advance as tolerated   Hypertension > More recently has had issues with low blood pressure. - Is prescribed torsemide as needed for CHF  Hyperlipidemia - Continue home rosuvastatin  Hypothyroidism - Continue home Synthroid  Atrial fibrillation - Continue home Eliquis - Amiodarone recently discontinued due to QT prolongation   QT prolongation - Amiodarone recently discontinued as above - EKG now and tomorrow morning - Avoid QT prolonging medications  CAD > Status post CABG in 1993 with some subsequent interventions required. - Continue home rosuvastatin - Eliquis  Chronic systolic CHF > Last echo with EF 35 to 40%, indeterminate Solik function, moderately reduced RV function.  Also with moderate to severe mitral valve regurgitation, mild to moderate aortic valve regurgitation. > At outpatient follow-up his torsemide was changed to as needed for 3 pounds in 1 day, 5 pounds in 7 days, shortness of breath due to recent issues with low blood pressure. > However, he reports taking a daily - Continue home torsemide  DVT prophylaxis: SCDs Code Status:   Full Family Communication:  None on admission  Disposition Plan:   Patient is from:  Home  Anticipated DC to:  Pending clinical course  Anticipated DC date:  1 to 3 days  Anticipated DC barriers: None  Consults called:  General Surgery Admission status:  Observation, telemetry  Severity of Illness: The appropriate patient status for this patient is OBSERVATION. Observation status is judged to be reasonable and necessary in order to provide the required intensity of service to ensure  the patient's safety. The patient's presenting symptoms, physical exam findings, and initial radiographic and laboratory data in the context of their medical condition is felt to place them at decreased risk for further clinical deterioration. Furthermore, it is anticipated that the patient will be medically stable for discharge  from the hospital within 2 midnights of admission.    Synetta Fail MD [facility]  How to contact the [facility] Attending or Consulting provider 7A - 7P or covering provider during after hours 7P -7A, for this patient?   Check the care team in [facility] and look for a) attending/consulting [facility] provider listed and b) the [facility] team listed Log into [email or web address] and use [place]'s universal password to access. If you do not have the password, please contact the hospital operator. Locate the [facility] provider you are looking for under [facility] and page to a number that you can be directly reached. If you still have difficulty reaching the provider, please page the [facility] (Director on Call) for the Hospitalists listed on amion for assistance.  01/26/2023, 5:03 PM

## 2023-01-26 NOTE — ED Provider Notes (Signed)
Received patient in turnover from Dr. Jeraldine Loots.  Please see their note for further details of Hx, PE.  Briefly patient is a 82 y.o. male with a Abdominal Pain .  Patient found to have a cholecystitis.  Plan to be evaluated by general surgery.  Patient has been evaluated by general surgery.  Recommending medical admission and they will follow while in the hospital..    Melene Plan, DO 01/26/23 1616

## 2023-01-26 NOTE — Plan of Care (Signed)

## 2023-01-26 NOTE — ED Notes (Signed)
ED TO INPATIENT HANDOFF REPORT  ED Nurse Name and Phone #: Dahlia Client [phone]  S Name/Age/Gender Bradley Oconnor 82 y.o. male Room/Bed: 026C/026C  Code Status   Code Status: Full Code  Home/SNF/Other Home Patient oriented to: self, place, time, and situation Is this baseline? Yes   Triage Complete: Triage complete  Chief Complaint Acute cholecystitis [K81.0]  Triage Note [facility] from home d/t L mid abd pain. Had same pain last week, was given OTC meds by his caregivers with no effect. Pain "coming in waves". Diarrhea after having metamucil and prune juice. B/P 100 palp HR 68 O2 98% RA CBG 156   Allergies Allergies  Allergen Reactions   Procardia [Nifedipine] Other (See Comments)    Hypotension    Phenergan [Promethazine Hcl] Nausea And Vomiting    Level of Care/Admitting Diagnosis ED Disposition     ED Disposition  Admit   Condition  --   Comment  Hospital Area: [facility] [[id number]]  Level of Care: Telemetry Surgical [105]  May place patient in observation at [facility] or [facility] if equivalent level of care is available:: No  Covid Evaluation: Asymptomatic - no recent exposure (last 10 days) testing not required  Diagnosis: Acute cholecystitis [575.0.ICD-9-CM]  Admitting Physician: Synetta Fail [[id number]]  Attending Physician: Synetta Fail [[id number]]          B Medical/Surgery History Past Medical History:  Diagnosis Date   Blood transfusion without reported diagnosis    CAD (coronary artery disease)    GERD (gastroesophageal reflux disease)    Heart attack (HCC)    Hyperlipidemia    Hypertension 03/07/2012   ECHO-WNL     08/12/11 Lexiscan MyoviewNo significant ischemia demonstrated Low risk scan There is a moderate sized dense scar in the LCX territoy unchanged from the prior study.. Post- stress EF is 40%.   Septic shock (HCC) 12/10/2022   Thyroid disease    Past Surgical History:  Procedure Laterality Date    APPENDECTOMY     CERVICAL SPINE SURGERY     titanium plate in the back of neck   CORONARY ARTERY BYPASS GRAFT     HERNIA REPAIR     SMALL INTESTINE SURGERY     THYROID SURGERY     1/2 thyroid removed on right side     A IV Location/Drains/Wounds Patient Lines/Drains/Airways Status     Active Line/Drains/Airways     Name Placement date Placement time Site Days   Peripheral IV 01/26/23 20 G Left Antecubital 01/26/23  1026  Antecubital  less than 1   Pressure Injury 12/10/22 Pretibial Proximal;Right Stage 2 -  Partial thickness loss of dermis presenting as a shallow open injury with a red, pink wound bed without slough. 12/10/22  0628  -- 47   Wound / Incision (Open or Dehisced) 12/10/22 Skin tear Arm Left;Lower;Posterior 12/10/22  0628  Arm  47   Wound / Incision (Open or Dehisced) 12/10/22 Skin tear Arm Anterior;Lower;Proximal;Right skin tear 12/10/22  0730  Arm  47   Wound / Incision (Open or Dehisced) 12/11/22 Skin tear Arm Anterior;Left;Lower;Proximal 12/11/22  0730  Arm  46            Intake/Output Last 24 hours  Intake/Output Summary (Last 24 hours) at 01/26/2023 1704 Last data filed at 01/26/2023 1459 Gross per 24 hour  Intake 1200 ml  Output --  Net 1200 ml    Labs/Imaging Results for orders placed or performed during the hospital encounter of 01/26/23 (  from the past 48 hour(s))  Urinalysis, Routine w reflex microscopic -Urine, Clean Catch     Status: Abnormal   Collection Time: 01/26/23 10:15 AM  Result Value Ref Range   Color, Urine YELLOW YELLOW   APPearance CLEAR CLEAR   Specific Gravity, Urine 1.006 1.005 - 1.030   pH 7.0 5.0 - 8.0   Glucose, UA NEGATIVE NEGATIVE mg/dL   Hgb urine dipstick NEGATIVE NEGATIVE   Bilirubin Urine NEGATIVE NEGATIVE   Ketones, ur NEGATIVE NEGATIVE mg/dL   Protein, ur NEGATIVE NEGATIVE mg/dL   Nitrite NEGATIVE NEGATIVE   Leukocytes,Ua LARGE (A) NEGATIVE   RBC / HPF 0-5 0 - 5 RBC/hpf   WBC, UA >50 0 - 5 WBC/hpf   Bacteria,  UA NONE SEEN NONE SEEN   Squamous Epithelial / HPF 0-5 0 - 5 /HPF   Budding Yeast PRESENT     Comment: Performed at [facility], [address], [place], [place] [address]  Comprehensive metabolic panel     Status: Abnormal   Collection Time: 01/26/23 10:26 AM  Result Value Ref Range   Sodium 142 135 - 145 mmol/L   Potassium 2.9 (L) 3.5 - 5.1 mmol/L   Chloride 104 98 - 111 mmol/L   CO2 25 22 - 32 mmol/L   Glucose, Bld 127 (H) 70 - 99 mg/dL    Comment: Glucose reference range applies only to samples taken after fasting for at least 8 hours.   BUN 8 8 - 23 mg/dL   Creatinine, Ser 2.84 0.61 - 1.24 mg/dL   Calcium 8.2 (L) 8.9 - 10.3 mg/dL   Total Protein 5.9 (L) 6.5 - 8.1 g/dL   Albumin 2.7 (L) 3.5 - 5.0 g/dL   AST 22 15 - 41 U/L   ALT 15 0 - 44 U/L   Alkaline Phosphatase 62 38 - 126 U/L   Total Bilirubin 0.7 0.3 - 1.2 mg/dL   GFR, Estimated >13 >24 mL/min    Comment: (NOTE) Calculated using the CKD-EPI Creatinine Equation (2021)    Anion gap 13 5 - 15    Comment: Performed at [facility], [address], [place], [place] [address]  Lipase, blood     Status: None   Collection Time: 01/26/23 10:26 AM  Result Value Ref Range   Lipase 34 11 - 51 U/L    Comment: Performed at [facility], [address] [place], [place] [address]  CBC with Diff     Status: Abnormal   Collection Time: 01/26/23 10:26 AM  Result Value Ref Range   WBC 5.9 4.0 - 10.5 K/uL   RBC 3.73 (L) 4.22 - 5.81 MIL/uL   Hemoglobin 11.6 (L) 13.0 - 17.0 g/dL   HCT 64.4 (L) 03.4 - 74.2 %   MCV 96.0 80.0 - 100.0 fL   MCH 31.1 26.0 - 34.0 pg   MCHC 32.4 30.0 - 36.0 g/dL   RDW 59.5 (H) 63.8 - 75.6 %   Platelets 222 150 - 400 K/uL   nRBC 0.0 0.0 - 0.2 %   Neutrophils Relative % 45 %   Neutro Abs 2.7 1.7 - 7.7 K/uL   Lymphocytes Relative 44 %   Lymphs Abs 2.6 0.7 - 4.0 K/uL   Monocytes Relative 7 %   Monocytes Absolute 0.4 0.1 - 1.0 K/uL   Eosinophils Relative 3 %   Eosinophils Absolute 0.2  0.0 - 0.5 K/uL   Basophils Relative 1 %   Basophils Absolute 0.0 0.0 -  0.1 K/uL   Immature Granulocytes 0 %   Abs Immature Granulocytes 0.01 0.00 - 0.07 K/uL    Comment: Performed at [facility], [address] [place], [place] [address]   US Abdomen Limited RUQ (LIVER/GB)  Result Date: 01/26/2023 CLINICAL DATA:  Right upper quadrant abdominal pain EXAM: ULTRASOUND ABDOMEN LIMITED RIGHT UPPER QUADRANT COMPARISON:  None Available. FINDINGS: Gallbladder: Distended gallbladder. Layering stones. No wall thickening visualized. Positive sonographic Murphy sign noted by sonographer. Common bile duct: Not well seen. Liver: No focal lesion identified. Within normal limits in parenchymal echogenicity. Portal vein is patent on color Doppler imaging with normal direction of blood flow towards the liver. Other: Partially imaged right pleural effusion. IMPRESSION: Distended gallbladder containing layering stones with positive sonographic Murphy's sign, suspicious for acute cholecystitis. Electronically Signed   By: Agustin Cree M.D.   On: 01/26/2023 14:53   CT ABDOMEN PELVIS WO CONTRAST  Result Date: 01/26/2023 CLINICAL DATA:  Left lower quadrant abdominal pain EXAM: CT ABDOMEN AND PELVIS WITHOUT CONTRAST TECHNIQUE: Multidetector CT imaging of the abdomen and pelvis was performed following the standard protocol without IV contrast. RADIATION DOSE REDUCTION: This exam was performed according to the departmental dose-optimization program which includes automated exposure control, adjustment of the mA and/or kV according to patient size and/or use of iterative reconstruction technique. COMPARISON:  12/29/2022 FINDINGS: Lower chest: Small bilateral pleural effusions, decreased since prior study. Minimal dependent atelectasis. Coronary artery and aortic atherosclerosis. Hepatobiliary: Layering stones within the gallbladder which is mildly distended. No biliary ductal dilatation. No visible ductal stones. Pancreas:  No focal abnormality or ductal dilatation. Spleen: No focal abnormality.  Normal size. Adrenals/Urinary Tract: No adrenal abnormality. No focal renal abnormality. No stones or hydronephrosis. Urinary bladder is unremarkable. Stomach/Bowel: Stomach, large and small bowel grossly unremarkable. Vascular/Lymphatic: Diffuse aortoiliac atherosclerosis. No evidence of aneurysm or adenopathy. Reproductive: No visible focal abnormality. Other: No free fluid or free air. Musculoskeletal: No acute bony abnormality. IMPRESSION: Small bilateral pleural effusions, decreased since prior study. Layering cholelithiasis. Mild distention of the gallbladder, slightly decreased since prior study. Coronary artery disease, aortic atherosclerosis. No acute findings. Electronically Signed   By: Charlett Nose M.D.   On: 01/26/2023 12:40    Pending Labs Unresulted Labs (From admission, onward)     Start     Ordered   01/27/23 0500  Comprehensive metabolic panel  Tomorrow morning,   R        01/26/23 1700   01/27/23 0500  CBC  Tomorrow morning,   R        01/26/23 1700   01/26/23 1700  Magnesium  Add-on,   AD        01/26/23 1700            Vitals/Pain Today's Vitals   01/26/23 1410 01/26/23 1430 01/26/23 1630 01/26/23 1657  BP:  (!) 101/59 101/70   Pulse:  65 62   Resp:  18 14   Temp: 98 F (36.7 C)     TempSrc: Oral     SpO2:  99% 100%   Weight:      Height:      PainSc:    3     Isolation Precautions No active isolations  Medications Medications  rosuvastatin (CRESTOR) tablet 10 mg (has no administration in time range)  Melatonin TABS 10 mg (has no administration in time range)  levothyroxine (SYNTHROID) tablet 100 mcg (has no administration in time range)  sodium chloride flush (NS) 0.9 % injection  3 mL (has no administration in time range)  acetaminophen (TYLENOL) tablet 650 mg (has no administration in time range)    Or  acetaminophen (TYLENOL) suppository 650 mg (has no administration in time  range)  polyethylene glycol (MIRALAX / GLYCOLAX) packet 17 g (has no administration in time range)  fentaNYL (SUBLIMAZE) injection 25 mcg (25 mcg Intravenous Given 01/26/23 1029)  ondansetron (ZOFRAN) injection 4 mg (4 mg Intravenous Given 01/26/23 1029)  lactated ringers bolus 1,000 mL (0 mLs Intravenous Stopped 01/26/23 1143)  morphine (PF) 4 MG/ML injection 4 mg (4 mg Intravenous Given 01/26/23 1107)  potassium chloride 10 mEq in 100 mL IVPB (0 mEq Intravenous Stopped 01/26/23 1629)  morphine (PF) 4 MG/ML injection 4 mg (4 mg Intravenous Given 01/26/23 1635)    Mobility non-ambulatory     Focused Assessments     R Recommendations: See Admitting Provider Note  Report given to:   Additional Notes:

## 2023-01-26 NOTE — Progress Notes (Incomplete)
I personally saw the patient and performed a substantive portion of the medical decision making, in conjunction with the Physician Assistant, Hosie Spangle, PA-C for the treatment of  abdominal pain.  I reviewed recent lab values, vitals, and notes.  This care required moderate level of medical decision making.  Clinically inconsistent with acute cholecystitis. GB distention likely 2/2   Bradley Monks, MD General and Trauma Surgery [facility] Surgery

## 2023-01-26 NOTE — Consult Note (Signed)
[facility] Surgery Consult Note  Bradley Oconnor 11-10-1940  [id number].    Requesting MD: Gerhard Munch Chief Complaint/Reason for Consult: cholecystitis  HPI:  Bradley Oconnor is a 82 y.o. 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 Pain is suprapubic and all across his lower abdomen.patient and care giver state that the pain has be constant, with intermittent bouts of improvement, for 2 weeks or more. He also reports penile pain with urination as well as generalized pelvic pressure. The pain is not exacerbated by PO intake. He denies nausea or vomiting. Initially thought he was constipated so he tried taking some prune juice and metamucil. He was able to have a bowel movement but this did not improve his pain. Denies fever.   Of note, patient was admitted last month for septic shock secondary to pneumonia and UTI. He had a complicated course with new onset HFrEF, A fib RVR, acute PE/DVT, failure to thrive. His caregiver states he has not walked since the summer of this year due to recurrent hospitalizations. He apparently has a brother who lives out of state.  In the ED his BP has been soft, otherwise hemodynamically stable. Lab work pertinent for WBC 5.9, LFTs and lipase WNL. CT a/p with no acute findings. Abdominal u/s shows a distended gallbladder containing layering stones with positive sonographic Murphy's sign, suspicious for acute cholecystitis.   Abdominal surgical history: ileocecectomy for ruptured appendix mid-1990s, hernia repair Nonsmoker Denies alcohol or illicit drug use Lives home along with 24/7 RN support and a caregiver   ROS: ROS  All systems reviewed and otherwise negative except for as above  Family History  Problem Relation Age of Onset   Heart disease  Mother    Cancer Mother        breast, stomach   Heart disease Father    Hyperlipidemia Father    Heart disease Brother    Diabetes Brother    Hyperlipidemia Brother    Heart disease Paternal [employer] surgeon     Past Medical History:  Diagnosis Date   Blood transfusion without reported diagnosis    CAD (coronary artery disease)    GERD (gastroesophageal reflux disease)    Heart attack (HCC)    Hyperlipidemia    Hypertension 03/07/12   ECHO-WNL     08/12/11 Lexiscan MyoviewNo significant ischemia demonstrated Low risk scan There is a moderate sized dense scar in the LCX territoy unchanged from the prior study.. Post- stress EF is 40%.   Thyroid disease     Past Surgical History:  Procedure Laterality Date   APPENDECTOMY     CERVICAL SPINE SURGERY     titanium plate in the back of neck   CORONARY ARTERY BYPASS GRAFT     HERNIA REPAIR     SMALL INTESTINE SURGERY     THYROID SURGERY     1/2 thyroid removed on right side    Social History:  reports that he has never smoked. He has never used smokeless tobacco. He reports that he does not drink alcohol and does not use drugs.  Allergies:  Allergies  Allergen Reactions   Procardia [Nifedipine] Other (See Comments)    Hypotension    Phenergan [Promethazine Hcl] Nausea And Vomiting    (Not in a hospital admission)   Prior  to Admission medications   Medication Sig Start Date End Date Taking? Authorizing Provider  acetaminophen (TYLENOL) 325 MG tablet Take 650 mg by mouth every 6 (six) hours as needed.    [provider]  apixaban (ELIQUIS) 5 MG TABS tablet Take 1 tablet (5 mg total) by mouth 2 (two) times daily. 01/19/23   [name], MD  cyanocobalamin (VITAMIN B12) 1000 MCG tablet Take 1 tablet (1,000 mcg total) by mouth daily. 01/19/23   [name], MD  folic acid (FOLVITE) 1 MG tablet Take 1 tablet (1 mg total) by mouth daily. 12/22/22   Elgergawy, Leana Roe, MD  ibuprofen (ADVIL)  200 MG tablet Take 200 mg by mouth daily as needed for headache or moderate pain. Patient not taking: Reported on 01/18/2023    [provider]  levothyroxine (SYNTHROID) 100 MCG tablet TAKE 1 TABLET BY MOUTH ONCE DAILY IN THE MORNING -  NEED  OFFICE  VISIT  WITH  DR.  Tresa Endo  FOR  FUTURE  REFILLS Patient not taking: Reported on 01/18/2023 06/29/22   Lennette Bihari, MD  magic mouthwash w/lidocaine SOLN Take 5 mLs by mouth 4 (four) times daily. Patient not taking: Reported on 01/18/2023 12/21/22   Elgergawy, Leana Roe, MD  Melatonin 10 MG TABS Take 10 mg by mouth at bedtime as needed.    [provider]  mirtazapine (REMERON SOL-TAB) 15 MG disintegrating tablet Take 1 tablet (15 mg total) by mouth at bedtime. 01/19/23   [name], MD  Multiple Vitamins-Minerals (MULTIVITAMIN GUMMIES MENS) CHEW Chew 1 each by mouth daily.    [provider]  psyllium (METAMUCIL) 58.6 % packet Take 1 packet by mouth daily.    [provider]  rosuvastatin (CRESTOR) 20 MG tablet Take 0.5 tablets (10 mg total) by mouth daily. 01/23/23   [name], MD  Sennosides 25 MG TABS Take 25-75 mg by mouth daily. Patient not taking: Reported on 01/18/2023    [provider]  torsemide (DEMADEX) 20 MG tablet Take 1 tablet (20 mg total) by mouth daily. 01/19/23   [name], MD    Blood pressure (!) 101/59, pulse 65, temperature 98 F (36.7 C), temperature source Oral, resp. rate 18, height 5\' 8"  (1.727 m), weight 67 kg, SpO2 99%. Physical Exam: General: pleasant, chronically ill appearing man in NAD. HEENT: head is normocephalic, atraumatic.  Sclera are noninjected.  Pupils equal and round.  Ears and nose without any masses or lesions.  Mouth is pink and moist. Dentition poor Heart: regular, rate, and rhythm.  Normal s1,s2. No obvious murmurs, gallops, or rubs noted.  Palpable radial and pedal pulses bilaterally  Lungs: CTAB, no wheezes, rhonchi, or rales noted.   Respiratory effort nonlabored Abd: soft, nondistended, nontender in the RUQ,tender to light palpation of the SP region with guarding.  MS: no BUE/BLE edema, calves soft and nontender Skin: warm and dry with no masses, lesions, or rashes Psych: A&Ox4 with an appropriate affect Neuro: MAEs, no gross motor or sensory deficits BUE/BLE  Results for orders placed or performed during the hospital encounter of 01/26/23 (from the past 48 hour(s))  Urinalysis, Routine w reflex microscopic -Urine, Clean Catch     Status: Abnormal   Collection Time: 01/26/23 10:15 AM  Result Value Ref Range   Color, Urine YELLOW YELLOW   APPearance CLEAR CLEAR   Specific Gravity, Urine 1.006 1.005 - 1.030   pH 7.0 5.0 - 8.0   Glucose, UA NEGATIVE NEGATIVE  mg/dL   Hgb urine dipstick NEGATIVE NEGATIVE   Bilirubin Urine NEGATIVE NEGATIVE   Ketones, ur NEGATIVE NEGATIVE mg/dL   Protein, ur NEGATIVE NEGATIVE mg/dL   Nitrite NEGATIVE NEGATIVE   Leukocytes,Ua LARGE (A) NEGATIVE   RBC / HPF 0-5 0 - 5 RBC/hpf   WBC, UA >50 0 - 5 WBC/hpf   Bacteria, UA NONE SEEN NONE SEEN   Squamous Epithelial / HPF 0-5 0 - 5 /HPF   Budding Yeast PRESENT     Comment: Performed at [facility], [address] [place], [place] [address]  Comprehensive metabolic panel     Status: Abnormal   Collection Time: 01/26/23 10:26 AM  Result Value Ref Range   Sodium 142 135 - 145 mmol/L   Potassium 2.9 (L) 3.5 - 5.1 mmol/L   Chloride 104 98 - 111 mmol/L   CO2 25 22 - 32 mmol/L   Glucose, Bld 127 (H) 70 - 99 mg/dL    Comment: Glucose reference range applies only to samples taken after fasting for at least 8 hours.   BUN 8 8 - 23 mg/dL   Creatinine, Ser 6.21 0.61 - 1.24 mg/dL   Calcium 8.2 (L) 8.9 - 10.3 mg/dL   Total Protein 5.9 (L) 6.5 - 8.1 g/dL   Albumin 2.7 (L) 3.5 - 5.0 g/dL   AST 22 15 - 41 U/L   ALT 15 0 - 44 U/L   Alkaline Phosphatase 62 38 - 126 U/L   Total Bilirubin 0.7 0.3 - 1.2 mg/dL   GFR, Estimated >30 >86 mL/min     Comment: (NOTE) Calculated using the CKD-EPI Creatinine Equation (2021)    Anion gap 13 5 - 15    Comment: Performed at [facility], [address] [place], [place] [address]  Lipase, blood     Status: None   Collection Time: 01/26/23 10:26 AM  Result Value Ref Range   Lipase 34 11 - 51 U/L    Comment: Performed at [facility], [address] [place], [place] [address]  CBC with Diff     Status: Abnormal   Collection Time: 01/26/23 10:26 AM  Result Value Ref Range   WBC 5.9 4.0 - 10.5 K/uL   RBC 3.73 (L) 4.22 - 5.81 MIL/uL   Hemoglobin 11.6 (L) 13.0 - 17.0 g/dL   HCT 28.4 (L) 13.2 - 44.0 %   MCV 96.0 80.0 - 100.0 fL   MCH 31.1 26.0 - 34.0 pg   MCHC 32.4 30.0 - 36.0 g/dL   RDW 10.2 (H) 72.5 - 36.6 %   Platelets 222 150 - 400 K/uL   nRBC 0.0 0.0 - 0.2 %   Neutrophils Relative % 45 %   Neutro Abs 2.7 1.7 - 7.7 K/uL   Lymphocytes Relative 44 %   Lymphs Abs 2.6 0.7 - 4.0 K/uL   Monocytes Relative 7 %   Monocytes Absolute 0.4 0.1 - 1.0 K/uL   Eosinophils Relative 3 %   Eosinophils Absolute 0.2 0.0 - 0.5 K/uL   Basophils Relative 1 %   Basophils Absolute 0.0 0.0 - 0.1 K/uL   Immature Granulocytes 0 %   Abs Immature Granulocytes 0.01 0.00 - 0.07 K/uL    Comment: Performed at [facility], [address] [place], [place] [address]   US Abdomen Limited RUQ (LIVER/GB)  Result Date: 01/26/2023 CLINICAL DATA:  Right upper quadrant abdominal pain EXAM: ULTRASOUND ABDOMEN LIMITED RIGHT UPPER QUADRANT COMPARISON:  None Available.  FINDINGS: Gallbladder: Distended gallbladder. Layering stones. No wall thickening visualized. Positive sonographic Murphy sign noted by sonographer. Common bile duct: Not well seen. Liver: No focal lesion identified. Within normal limits in parenchymal echogenicity. Portal vein is patent on color Doppler imaging with normal direction of blood flow towards the liver. Other: Partially imaged right pleural effusion. IMPRESSION: Distended  gallbladder containing layering stones with positive sonographic Murphy's sign, suspicious for acute cholecystitis. Electronically Signed   By: Agustin Cree M.D.   On: 01/26/2023 14:53   CT ABDOMEN PELVIS WO CONTRAST  Result Date: 01/26/2023 CLINICAL DATA:  Left lower quadrant abdominal pain EXAM: CT ABDOMEN AND PELVIS WITHOUT CONTRAST TECHNIQUE: Multidetector CT imaging of the abdomen and pelvis was performed following the standard protocol without IV contrast. RADIATION DOSE REDUCTION: This exam was performed according to the departmental dose-optimization program which includes automated exposure control, adjustment of the mA and/or kV according to patient size and/or use of iterative reconstruction technique. COMPARISON:  12/29/2022 FINDINGS: Lower chest: Small bilateral pleural effusions, decreased since prior study. Minimal dependent atelectasis. Coronary artery and aortic atherosclerosis. Hepatobiliary: Layering stones within the gallbladder which is mildly distended. No biliary ductal dilatation. No visible ductal stones. Pancreas: No focal abnormality or ductal dilatation. Spleen: No focal abnormality.  Normal size. Adrenals/Urinary Tract: No adrenal abnormality. No focal renal abnormality. No stones or hydronephrosis. Urinary bladder is unremarkable. Stomach/Bowel: Stomach, large and small bowel grossly unremarkable. Vascular/Lymphatic: Diffuse aortoiliac atherosclerosis. No evidence of aneurysm or adenopathy. Reproductive: No visible focal abnormality. Other: No free fluid or free air. Musculoskeletal: No acute bony abnormality. IMPRESSION: Small bilateral pleural effusions, decreased since prior study. Layering cholelithiasis. Mild distention of the gallbladder, slightly decreased since prior study. Coronary artery disease, aortic atherosclerosis. No acute findings. Electronically Signed   By: Charlett Nose M.D.   On: 01/26/2023 12:40    Anti-infectives (From admission, onward)    None         Assessment/Plan Abdominal pain Gallstones - Patient presents with several days of constant, lower abdominal pain and a question of cholecystitis on u/s. He has gallstones and a distended gallbladder, but no wall thickening or pericholecystic fluid. WBC and LFTs are WNL. On exam he is completely non-tender in the RUQ and his history is not consistent with biliary colic. He very tender in the suprapubic region and endorses urinary sxs. I have an extremely low suspicion for cholecystitis. No role for surgery at this time.  I do not feel HIDA is warranted as he has no symptoms or labs results to support cholecystitis at this time, just a distended gallbladder with stones on imaging.   CCS will sign off, please call as needed.  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   Hosie Spangle, [facility] Surgery 01/26/2023, 4:10 PM Please see Amion for pager number during day hours 7:00am-4:30pm

## 2023-01-27 DIAGNOSIS — K810 Acute cholecystitis: Secondary | ICD-10-CM | POA: Diagnosis not present

## 2023-01-27 DIAGNOSIS — I509 Heart failure, unspecified: Secondary | ICD-10-CM | POA: Diagnosis not present

## 2023-01-27 DIAGNOSIS — I5042 Chronic combined systolic (congestive) and diastolic (congestive) heart failure: Secondary | ICD-10-CM | POA: Diagnosis not present

## 2023-01-27 DIAGNOSIS — I4819 Other persistent atrial fibrillation: Secondary | ICD-10-CM | POA: Diagnosis not present

## 2023-01-27 DIAGNOSIS — R1030 Lower abdominal pain, unspecified: Secondary | ICD-10-CM | POA: Diagnosis not present

## 2023-01-27 DIAGNOSIS — Z7901 Long term (current) use of anticoagulants: Secondary | ICD-10-CM | POA: Diagnosis not present

## 2023-01-27 DIAGNOSIS — E039 Hypothyroidism, unspecified: Secondary | ICD-10-CM | POA: Diagnosis not present

## 2023-01-27 DIAGNOSIS — E876 Hypokalemia: Secondary | ICD-10-CM | POA: Diagnosis not present

## 2023-01-27 DIAGNOSIS — R9431 Abnormal electrocardiogram [ECG] [EKG]: Secondary | ICD-10-CM | POA: Diagnosis not present

## 2023-01-27 DIAGNOSIS — Z79899 Other long term (current) drug therapy: Secondary | ICD-10-CM | POA: Diagnosis not present

## 2023-01-27 DIAGNOSIS — L89153 Pressure ulcer of sacral region, stage 3: Secondary | ICD-10-CM | POA: Diagnosis not present

## 2023-01-27 DIAGNOSIS — I2510 Atherosclerotic heart disease of native coronary artery without angina pectoris: Secondary | ICD-10-CM | POA: Diagnosis not present

## 2023-01-27 DIAGNOSIS — I110 Hypertensive heart disease with heart failure: Secondary | ICD-10-CM | POA: Diagnosis not present

## 2023-01-27 DIAGNOSIS — Z951 Presence of aortocoronary bypass graft: Secondary | ICD-10-CM | POA: Diagnosis not present

## 2023-01-27 LAB — COMPREHENSIVE METABOLIC PANEL
ALT: 14 U/L (ref 0–44)
AST: 16 U/L (ref 15–41)
Albumin: 2.3 g/dL — ABNORMAL LOW (ref 3.5–5.0)
Alkaline Phosphatase: 58 U/L (ref 38–126)
Anion gap: 7 (ref 5–15)
BUN: 7 mg/dL — ABNORMAL LOW (ref 8–23)
CO2: 29 mmol/L (ref 22–32)
Calcium: 8.2 mg/dL — ABNORMAL LOW (ref 8.9–10.3)
Chloride: 105 mmol/L (ref 98–111)
Creatinine, Ser: 0.85 mg/dL (ref 0.61–1.24)
GFR, Estimated: >60 mL/min (ref >60)
Glucose, Bld: 105 mg/dL — ABNORMAL HIGH (ref 70–99)
Potassium: 3.3 mmol/L — ABNORMAL LOW (ref 3.5–5.1)
Sodium: 141 mmol/L (ref 135–145)
Total Bilirubin: 0.8 mg/dL (ref 0.3–1.2)
Total Protein: 5.1 g/dL — ABNORMAL LOW (ref 6.5–8.1)

## 2023-01-27 LAB — CBC
HCT: 33 % — ABNORMAL LOW (ref 39.0–52.0)
Hemoglobin: 10.4 g/dL — ABNORMAL LOW (ref 13.0–17.0)
MCH: 30.7 pg (ref 26.0–34.0)
MCHC: 31.5 g/dL (ref 30.0–36.0)
MCV: 97.3 fL (ref 80.0–100.0)
Platelets: 210 10*3/uL (ref 150–400)
RBC: 3.39 MIL/uL — ABNORMAL LOW (ref 4.22–5.81)
RDW: 16.9 % — ABNORMAL HIGH (ref 11.5–15.5)
WBC: 5.5 10*3/uL (ref 4.0–10.5)
nRBC: 0 % (ref 0.0–0.2)

## 2023-01-27 LAB — MAGNESIUM: Magnesium: 1.2 mg/dL — ABNORMAL LOW (ref 1.7–2.4)

## 2023-01-27 MED ORDER — OXYCODONE HCL 5 MG PO TABS: 10.0000 mg | ORAL_TABLET | Freq: Four times a day (QID) | ORAL | Status: DC | PRN

## 2023-01-27 MED ORDER — OXYCODONE HCL 5 MG PO TABS
5.0000 mg | ORAL_TABLET | Freq: Four times a day (QID) | ORAL | Status: DC | PRN
  Administered 2023-01-27: 5 mg via ORAL
  Filled 2023-01-27: qty 1

## 2023-01-27 MED ORDER — POTASSIUM CHLORIDE CRYS ER 20 MEQ PO TBCR
40.0000 meq | EXTENDED_RELEASE_TABLET | Freq: Once | ORAL | Status: AC
  Administered 2023-01-27: 40 meq via ORAL
  Filled 2023-01-27: qty 2

## 2023-01-27 MED ORDER — MAGNESIUM SULFATE 4 GM/100ML IV SOLN
4.0000 g | Freq: Once | INTRAVENOUS | Status: AC
  Administered 2023-01-27: 4 g via INTRAVENOUS
  Filled 2023-01-27: qty 100

## 2023-01-27 MED ORDER — HYDROMORPHONE HCL 1 MG/ML IJ SOLN: 1.0000 mg | INTRAMUSCULAR | Status: DC | PRN

## 2023-01-27 MED ORDER — KETOROLAC TROMETHAMINE 15 MG/ML IJ SOLN
15.0000 mg | Freq: Once | INTRAMUSCULAR | Status: AC
  Administered 2023-01-27: 15 mg via INTRAVENOUS
  Filled 2023-01-27: qty 1

## 2023-01-27 NOTE — Plan of Care (Signed)

## 2023-01-27 NOTE — Progress Notes (Signed)
PROGRESS NOTE  Bradley Oconnor  YTK:[id number] DOB: 08-30-1940 DOA: 01/26/2023 PCP: [name], MD   Brief Narrative: Patient is a 82 year old male with history of hypertension, hyperlipidemia, hypothyroidism, paroxysmal A-fib, atrial fibrillation, GERD, coronary artery disease status post CABG, chronic systolic CHF, mitral regurgitation who presented with mid abdominal pain that was ongoing for 3 days.  On presentation he was hemodynamically stable.  Lab work showed potassium of 2.9, magnesium of 1.3.  CT abdomen chest which showed small bilateral pleural effusion, cholelithiasis, mild gallbladder distention.  Right upper quadrant ultrasound was suspicious for acute cholecystitis with positive sonographic Murphy sign.  General surgery consulted.  As per general surgery, there is low suspicion for acute cholecystitis.  Plan for HIDA scan today  Assessment & Plan:  Principal Problem:   Intractable lower abdominal pain Active Problems:   CAD (coronary artery disease) of artery bypass graft   Hypothyroidism   Hyperlipidemia with target LDL less than 70   GERD (gastroesophageal reflux disease)   HTN (hypertension)   Persistent atrial fibrillation (HCC)   Chronic combined systolic and diastolic CHF (congestive heart failure) (HCC)   Prolonged QT interval   Cholelithiasis  Abdominal pain/cholelithiasis/Suspected acute cholecystitis: Presented with pain in the mid abdomen.  Pain is just above the suprapubic region.  No tenderness on the right upper quadrant.  No fever or leukocytosis. Ultrasound of the abdomen/CT abdomen/pelvis was suspicious for acute cholecystitis but on clinical examination, there was no right upper quadrant tenderness. After discussion with general surgery, we are planning for HIDA scan.  Will keep him n.p.o.  Hold antibiotics for now.  Hypertension: Currently blood pressure stable.  Antihypertensives on hold  Paroxysmal A-fib: Currently in normal sinus rhythm.   Monitor on telemetry.  Eliquis on hold  Hyperlipidemia: On Crestor  Hypothyroidism: On Synthyroid  Prolonged Qtc: Chronic problem.  Amiodarone was discontinued as an outpatient.  Coronary artery disease: Status post CABG.  No anginal symptoms.  Chronic systolic CHF/moderate to severe mitral regurgitation: Currently euvolemic.  Last echo showed EF of 35 to 40%, moderately reduced right ventricular function, moderate to severe mitral valve regurgitation.  Follows with cardiology.  Takes torsemide at home  Hypokalemia/hypomagnesemia: Will continue monitoring and supplementation as needed.     Pressure Injury 01/26/23 Coccyx Medial Stage 1 -  Intact skin with non-blanchable redness of a localized area usually over a bony prominence. (Active)  01/26/23 1847  Location: Coccyx  Location Orientation: Medial  Staging: Stage 1 -  Intact skin with non-blanchable redness of a localized area usually over a bony prominence.  Wound Description (Comments):   Present on Admission: Yes  Dressing Type Foam - Lift dressing to assess site every shift 01/26/23 2000    DVT prophylaxis:SCDs Start: 01/26/23 1656     Code Status: Full Code  Family Communication: Discussed with family at bedside  Patient status:Obs   Patient is from :home  Anticipated discharge FT:DDUK  Estimated DC date:1-2 days   Consultants: General Surgery  Procedures: None  Antimicrobials:  Anti-infectives (From admission, onward)    None       Subjective:  Patient seen and examined the bedside today.  He looks comfortable during my evaluation.  No fever.  On room air.  Continues to complain of intermittent abdominal pain but stable the suprapubic region.  There are some tenderness over there but no right upper quadrant tenderness.  Patient is not in any kind of distress.  He looks comfortable and was about to eat his breakfast  Objective: Vitals:   01/26/23 2122 01/27/23 0200 01/27/23 0600 01/27/23 0753  BP:  100/64 102/68 109/72 (!) 96/56  Pulse: 63 94 88 67  Resp: 17 15  17   Temp: 97.9 F (36.6 C) 97.8 F (36.6 C) 98 F (36.7 C) 98 F (36.7 C)  TempSrc: Oral Oral Oral Oral  SpO2: 99% 97% 99% 100%  Weight:      Height:        Intake/Output Summary (Last 24 hours) at 01/27/2023 1028 Last data filed at 01/27/2023 0600 Gross per 24 hour  Intake 1440 ml  Output 490 ml  Net 950 ml   Filed Weights   01/26/23 1009  Weight: 67 kg    Examination:  General exam: Overall comfortable, not in distress, pleasant elderly male HEENT: PERRL Respiratory system:  no wheezes or crackles  Cardiovascular system: S1 & S2 heard, RRR.  Gastrointestinal system: Abdomen is nondistended, soft .  Tenderness on the mid abdomen.  Bowel sounds present Central nervous system: Alert and oriented Extremities: No edema, no clubbing ,no cyanosis Skin: No rashes, no ulcers,no icterus     Data Reviewed: I have personally reviewed following labs and imaging studies  CBC: Recent Labs  Lab 01/26/23 1026 01/27/23 0852  WBC 5.9 5.5  NEUTROABS 2.7  --   HGB 11.6* 10.4*  HCT 35.8* 33.0*  MCV 96.0 97.3  PLT 222 210   Basic Metabolic Panel: Recent Labs  Lab 01/26/23 1026  NA 142  K 2.9*  CL 104  CO2 25  GLUCOSE 127*  BUN 8  CREATININE 0.88  CALCIUM 8.2*  MG 1.3*     No results found for this or any previous visit (from the past 240 hour(s)).   Radiology Studies: US Abdomen Limited RUQ (LIVER/GB)  Result Date: 01/26/2023 CLINICAL DATA:  Right upper quadrant abdominal pain EXAM: ULTRASOUND ABDOMEN LIMITED RIGHT UPPER QUADRANT COMPARISON:  None Available. FINDINGS: Gallbladder: Distended gallbladder. Layering stones. No wall thickening visualized. Positive sonographic Murphy sign noted by sonographer. Common bile duct: Not well seen. Liver: No focal lesion identified. Within normal limits in parenchymal echogenicity. Portal vein is patent on color Doppler imaging with normal direction of blood  flow towards the liver. Other: Partially imaged right pleural effusion. IMPRESSION: Distended gallbladder containing layering stones with positive sonographic Murphy's sign, suspicious for acute cholecystitis. Electronically Signed   By: Agustin Cree M.D.   On: 01/26/2023 14:53   CT ABDOMEN PELVIS WO CONTRAST  Result Date: 01/26/2023 CLINICAL DATA:  Left lower quadrant abdominal pain EXAM: CT ABDOMEN AND PELVIS WITHOUT CONTRAST TECHNIQUE: Multidetector CT imaging of the abdomen and pelvis was performed following the standard protocol without IV contrast. RADIATION DOSE REDUCTION: This exam was performed according to the departmental dose-optimization program which includes automated exposure control, adjustment of the mA and/or kV according to patient size and/or use of iterative reconstruction technique. COMPARISON:  12/29/2022 FINDINGS: Lower chest: Small bilateral pleural effusions, decreased since prior study. Minimal dependent atelectasis. Coronary artery and aortic atherosclerosis. Hepatobiliary: Layering stones within the gallbladder which is mildly distended. No biliary ductal dilatation. No visible ductal stones. Pancreas: No focal abnormality or ductal dilatation. Spleen: No focal abnormality.  Normal size. Adrenals/Urinary Tract: No adrenal abnormality. No focal renal abnormality. No stones or hydronephrosis. Urinary bladder is unremarkable. Stomach/Bowel: Stomach, large and small bowel grossly unremarkable. Vascular/Lymphatic: Diffuse aortoiliac atherosclerosis. No evidence of aneurysm or adenopathy. Reproductive: No visible focal abnormality. Other: No free fluid or free air. Musculoskeletal: No acute bony abnormality. IMPRESSION: Small  bilateral pleural effusions, decreased since prior study. Layering cholelithiasis. Mild distention of the gallbladder, slightly decreased since prior study. Coronary artery disease, aortic atherosclerosis. No acute findings. Electronically Signed   By: Charlett Nose  M.D.   On: 01/26/2023 12:40    Scheduled Meds:  rosuvastatin  10 mg Oral Daily   sodium chloride flush  3 mL Intravenous Q12H   Continuous Infusions:   LOS: 0 days   Burnadette Pop, MD [facility] P10/30/2024, 10:28 AM

## 2023-01-28 ENCOUNTER — Observation Stay (HOSPITAL_COMMUNITY): Payer: PPO

## 2023-01-28 DIAGNOSIS — K8010 Calculus of gallbladder with chronic cholecystitis without obstruction: Secondary | ICD-10-CM | POA: Diagnosis not present

## 2023-01-28 DIAGNOSIS — R1011 Right upper quadrant pain: Secondary | ICD-10-CM | POA: Diagnosis not present

## 2023-01-28 DIAGNOSIS — R1030 Lower abdominal pain, unspecified: Secondary | ICD-10-CM | POA: Diagnosis not present

## 2023-01-28 DIAGNOSIS — K8020 Calculus of gallbladder without cholecystitis without obstruction: Secondary | ICD-10-CM | POA: Diagnosis not present

## 2023-01-28 LAB — COMPREHENSIVE METABOLIC PANEL
ALT: 13 U/L (ref 0–44)
AST: 18 U/L (ref 15–41)
Albumin: 2.7 g/dL — ABNORMAL LOW (ref 3.5–5.0)
Alkaline Phosphatase: 61 U/L (ref 38–126)
Anion gap: 10 (ref 5–15)
BUN: 11 mg/dL (ref 8–23)
CO2: 25 mmol/L (ref 22–32)
Calcium: 8.6 mg/dL — ABNORMAL LOW (ref 8.9–10.3)
Chloride: 106 mmol/L (ref 98–111)
Creatinine, Ser: 0.95 mg/dL (ref 0.61–1.24)
GFR, Estimated: >60 mL/min (ref >60)
Glucose, Bld: 125 mg/dL — ABNORMAL HIGH (ref 70–99)
Potassium: 3.9 mmol/L (ref 3.5–5.1)
Sodium: 141 mmol/L (ref 135–145)
Total Bilirubin: 0.9 mg/dL (ref 0.3–1.2)
Total Protein: 5.8 g/dL — ABNORMAL LOW (ref 6.5–8.1)

## 2023-01-28 LAB — MAGNESIUM: Magnesium: 2.1 mg/dL (ref 1.7–2.4)

## 2023-01-28 MED ORDER — TECHNETIUM TC 99M MEBROFENIN IV KIT
5.2000 | PACK | Freq: Once | INTRAVENOUS | Status: AC | PRN
  Administered 2023-01-28: 5.2 via INTRAVENOUS

## 2023-01-28 MED ORDER — OXYBUTYNIN CHLORIDE 5 MG PO TABS
5.0000 mg | ORAL_TABLET | Freq: Three times a day (TID) | ORAL | Status: DC | PRN
  Administered 2023-01-28: 5 mg via ORAL
  Filled 2023-01-28 (×2): qty 1

## 2023-01-28 NOTE — Plan of Care (Signed)

## 2023-01-28 NOTE — Progress Notes (Signed)
PROGRESS NOTE  Bradley Oconnor  ZOX:[id number] DOB: Jun 04, 1940 DOA: 01/26/2023 PCP: [name], MD   Brief Narrative: Patient is a 82 year old male with history of hypertension, hyperlipidemia, hypothyroidism, paroxysmal A-fib, atrial fibrillation, GERD, coronary artery disease status post CABG, chronic systolic CHF, mitral regurgitation who presented with mid abdominal pain that was ongoing for 3 days.  On presentation he was hemodynamically stable.  Lab work showed potassium of 2.9, magnesium of 1.3.  CT abdomen chest which showed small bilateral pleural effusion, cholelithiasis, mild gallbladder distention.  Right upper quadrant ultrasound was suspicious for acute cholecystitis with positive sonographic Murphy sign.  General surgery consulted.  As per general surgery, there is low suspicion for acute cholecystitis. S/P HIDA.  Awaiting recommendation from general surgery  Assessment & Plan:  Principal Problem:   Intractable lower abdominal pain Active Problems:   CAD (coronary artery disease) of artery bypass graft   Hypothyroidism   Hyperlipidemia with target LDL less than 70   GERD (gastroesophageal reflux disease)   HTN (hypertension)   Persistent atrial fibrillation (HCC)   Chronic combined systolic and diastolic CHF (congestive heart failure) (HCC)   Prolonged QT interval   Cholelithiasis  Abdominal pain/cholelithiasis/Suspected acute cholecystitis: Presented with pain in the mid abdomen.  Pain was just above the suprapubic region.  No tenderness on the right upper quadrant.  No fever or leukocytosis. Ultrasound of the abdomen/CT abdomen/pelvis was suspicious for acute cholecystitis but on clinical examination, there was no right upper quadrant tenderness. After discussion with general surgery, we ordered HIDA scan.  Showed poor filling of gallbladder, gallbladder dysfunction not excluded.  I have requested general surgery to see the patient today.  Antibiotics on  hold  Hypertension: Currently blood pressure stable.  Antihypertensives on hold  Paroxysmal A-fib: Currently in normal sinus rhythm.  Monitor on telemetry.  Eliquis on hold  Hyperlipidemia: On Crestor  Hypothyroidism: On Synthyroid  Prolonged Qtc: Chronic problem.  Amiodarone was discontinued as an outpatient.  Coronary artery disease: Status post CABG.  No anginal symptoms.  Chronic systolic CHF/moderate to severe mitral regurgitation: Currently euvolemic.  Last echo showed EF of 35 to 40%, moderately reduced right ventricular function, moderate to severe mitral valve regurgitation.  Follows with cardiology.  Takes torsemide at home  Hypokalemia/hypomagnesemia: Will continue monitoring and supplementation as needed.     Pressure Injury 01/26/23 Coccyx Medial Stage 1 -  Intact skin with non-blanchable redness of a localized area usually over a bony prominence. (Active)  01/26/23 1847  Location: Coccyx  Location Orientation: Medial  Staging: Stage 1 -  Intact skin with non-blanchable redness of a localized area usually over a bony prominence.  Wound Description (Comments):   Present on Admission: Yes  Dressing Type Foam - Lift dressing to assess site every shift 01/28/23 0715    DVT prophylaxis:SCDs Start: 01/26/23 1656     Code Status: Full Code  Family Communication: Discussed with son at bedside  Patient status:Obs   Patient is from :home  Anticipated discharge WJ:XBJY  Estimated DC date:1-2 days, awaiting recommendation from general surgery   Consultants: General Surgery  Procedures: None  Antimicrobials:  Anti-infectives (From admission, onward)    None       Subjective:  Patient seen examined the bedside today.  Hemodynamically stable.  He came from HIDA.  He does not complain of any abdomen pain today, Dilaudid helped. No nausea or vomiting.  Abdomen soft, nondistended, nontender.  Long discussion had at the bedside with the patient and son  about the  abnormal HIDA scan result.  They are expecting to talk to general surgery.  Objective: Vitals:   01/27/23 1621 01/27/23 1931 01/27/23 2022 01/28/23 0328  BP: (!) 92/59 123/60 102/62 113/63  Pulse: 73 72 69 67  Resp: 17 19 17 19   Temp: (!) 97.5 F (36.4 C) 98.6 F (37 C) 98.1 F (36.7 C) 99.5 F (37.5 C)  TempSrc:  Oral  Oral  SpO2: 94% 97% 100% 100%  Weight:    60.9 kg  Height:        Intake/Output Summary (Last 24 hours) at 01/28/2023 1326 Last data filed at 01/28/2023 0900 Gross per 24 hour  Intake 272.93 ml  Output 400 ml  Net -127.07 ml   Filed Weights   01/26/23 1009 01/28/23 0328  Weight: 67 kg 60.9 kg    Examination:  General exam: Overall comfortable, not in distress, pleasant elderly male HEENT: PERRL Respiratory system:  no wheezes or crackles  Cardiovascular system: S1 & S2 heard, RRR.  Gastrointestinal system: Abdomen is nondistended, soft and nontender. Central nervous system: Alert and oriented Extremities: No edema, no clubbing ,no cyanosis Skin: No rashes, no ulcers,no icterus     Data Reviewed: I have personally reviewed following labs and imaging studies  CBC: Recent Labs  Lab 01/26/23 1026 01/27/23 0852  WBC 5.9 5.5  NEUTROABS 2.7  --   HGB 11.6* 10.4*  HCT 35.8* 33.0*  MCV 96.0 97.3  PLT 222 210   Basic Metabolic Panel: Recent Labs  Lab 01/26/23 1026 01/27/23 0852  NA 142 141  K 2.9* 3.3*  CL 104 105  CO2 25 29  GLUCOSE 127* 105*  BUN 8 7*  CREATININE 0.88 0.85  CALCIUM 8.2* 8.2*  MG 1.3* 1.2*     No results found for this or any previous visit (from the past 240 hour(s)).   Radiology Studies: NM Hepato W/EF  Result Date: 01/28/2023 CLINICAL DATA:  Cholecystitis, cholelithiasis EXAM: NUCLEAR MEDICINE HEPATOBILIARY IMAGING WITH GALLBLADDER EF TECHNIQUE: Sequential images of the abdomen were obtained out to 60 minutes following intravenous administration of radiopharmaceutical. After oral ingestion of Ensure,  gallbladder ejection fraction was determined. At 60 min, normal ejection fraction is greater than 33%. RADIOPHARMACEUTICALS:  5.2 mCi Tc-22m  Choletec IV COMPARISON:  Ultrasound and CT examinations from 01/26/2023 FINDINGS: Satisfactory uptake of radiopharmaceutical from the blood pool. Biliary activity visible by 5 minutes, bowel activity seen at 15 minutes. Initial non filling of the gallbladder over the first 60 minutes. Because today's exam was requested as a check for gallbladder ejection fraction rather than assessment for cholecystitis, administration of morphine to encourage gallbladder filling was not feasible as it would interfere with the ejection fraction calculation. Accordingly, I elected to continue imaging the patient out and eventually at about 90 minutes we started to demonstrate definite filling of the gallbladder. This was an unusual pattern with some activity primarily tracking along the top of the gallbladder fossa but not filling atypical gallbladder shape. Patient has gallstones, but these occupy 20% or less of the gallbladder. We checked with the patient's history, there is no recent meal to indicate that the gallbladder should be contracted. I am uncertain why the gallbladder only seems to partially fill. Using this partially filled appearance, we attempted to carry out a gallbladder ejection fraction. This was not very effective, there is poor emptying of likely less than 10%. Extensive motion artifact causes the digital subtraction graph and calculated results to be inaccurate. I question the validity  of the ejection fraction calculation given the very poor filling of the gallbladder to start with. Normal gallbladder ejection fraction with Ensure is greater than 33% and less than 80%. IMPRESSION: 1. Poor filling of the gallbladder which only occurred between 90 and 120 minutes into the exam, and which was very limited in amount. Morphine was not utilized to encourage gallbladder filling  because it would have interfered with the gallbladder ejection fraction calculation. Because the poor filling of the gallbladder, in which it seems only part of the gallbladder fills, ejection fraction calculation is not felt to be valid and cannot be issued. Motion artifact during the ejection fraction calculation only exacerbates this problem. Gallbladder dysfunction cannot be readily excluded. Poor filling of the gallbladder could possibly be from stenosis in the cystic duct although the cystic duct is not felt to be completely obstructed. 2. The patient has known gallstones. 3. The common bile duct is patent. Prompt excretion of radiopharmaceutical into the bowel. 4. Prompt uptake of radiopharmaceutical from the blood pool. Electronically Signed   By: Gaylyn Rong M.D.   On: 01/28/2023 12:48    Scheduled Meds:  rosuvastatin  10 mg Oral Daily   sodium chloride flush  3 mL Intravenous Q12H   Continuous Infusions:   LOS: 0 days   Burnadette Pop, MD [facility] P10/31/2024, 1:26 PM

## 2023-01-28 NOTE — Progress Notes (Signed)
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 will continue to follow for discharge disposition. Please call with any [facility] related questions or concerns.  Thank you, Thea Gist, BSN [facility] liaison  [phone]

## 2023-01-28 NOTE — Progress Notes (Signed)
Patient ID: Bradley Oconnor, male   DOB: Nov 02, 1940, 82 y.o.   MRN: [id number] [facility] Surgery Progress Note     Subjective: CC-  Daughter at bedside Continues to complain of suprapubic pain. Tolerated a sandwich today without worsening symptoms. Denies n/v.  Objective: Vital signs in last 24 hours: Temp:  [97.5 F (36.4 C)-99.5 F (37.5 C)] 99.5 F (37.5 C) (10/31 0328) Pulse Rate:  [67-73] 67 (10/31 0328) Resp:  [17-19] 19 (10/31 0328) BP: (92-123)/(59-63) 113/63 (10/31 0328) SpO2:  [94 %-100 %] 100 % (10/31 0328) Weight:  [60.9 kg] 60.9 kg (10/31 0328) Last BM Date : 01/26/23  Intake/Output from previous day: 10/30 0701 - 10/31 0700 In: 752.9 [P.O.:720; IV Piggyback:32.9] Out: 700 [Urine:700] Intake/Output this shift: No intake/output data recorded.  PE: Gen:  Alert, NAD, pleasant Abd: soft, ND, nontender in the RUQ, tender to light palpation of the SP region with guarding.   Lab Results:  Recent Labs    01/26/23 1026 01/27/23 0852  WBC 5.9 5.5  HGB 11.6* 10.4*  HCT 35.8* 33.0*  PLT 222 210   BMET Recent Labs    01/27/23 0852 01/28/23 1337  NA 141 141  K 3.3* 3.9  CL 105 106  CO2 29 25  GLUCOSE 105* 125*  BUN 7* 11  CREATININE 0.85 0.95  CALCIUM 8.2* 8.6*   PT/INR No results for input(s): "LABPROT", "INR" in the last 72 hours. CMP     Component Value Date/Time   NA 141 01/28/2023 1337   NA 143 01/18/2023 1221   K 3.9 01/28/2023 1337   CL 106 01/28/2023 1337   CO2 25 01/28/2023 1337   GLUCOSE 125 (H) 01/28/2023 1337   BUN 11 01/28/2023 1337   BUN 19 01/18/2023 1221   CREATININE 0.95 01/28/2023 1337   CREATININE 0.83 04/29/2016 1213   CALCIUM 8.6 (L) 01/28/2023 1337   PROT 5.8 (L) 01/28/2023 1337   PROT 7.4 06/18/2017 1233   ALBUMIN 2.7 (L) 01/28/2023 1337   ALBUMIN 4.1 06/18/2017 1233   AST 18 01/28/2023 1337   ALT 13 01/28/2023 1337   ALKPHOS 61 01/28/2023 1337   BILITOT 0.9 01/28/2023 1337   BILITOT 0.9 06/18/2017 1233    GFRNONAA >60 01/28/2023 1337   GFRNONAA 55 (L) 04/17/2014 1510   GFRAA 78 06/18/2017 1233   GFRAA 64 04/17/2014 1510   Lipase     Component Value Date/Time   LIPASE 34 01/26/2023 1026       Studies/Results: NM Hepato W/EF  Result Date: 01/28/2023 CLINICAL DATA:  Cholecystitis, cholelithiasis EXAM: NUCLEAR MEDICINE HEPATOBILIARY IMAGING WITH GALLBLADDER EF TECHNIQUE: Sequential images of the abdomen were obtained out to 60 minutes following intravenous administration of radiopharmaceutical. After oral ingestion of Ensure, gallbladder ejection fraction was determined. At 60 min, normal ejection fraction is greater than 33%. RADIOPHARMACEUTICALS:  5.2 mCi Tc-49m  Choletec IV COMPARISON:  Ultrasound and CT examinations from 01/26/2023 FINDINGS: Satisfactory uptake of radiopharmaceutical from the blood pool. Biliary activity visible by 5 minutes, bowel activity seen at 15 minutes. Initial non filling of the gallbladder over the first 60 minutes. Because today's exam was requested as a check for gallbladder ejection fraction rather than assessment for cholecystitis, administration of morphine to encourage gallbladder filling was not feasible as it would interfere with the ejection fraction calculation. Accordingly, I elected to continue imaging the patient out and eventually at about 90 minutes we started to demonstrate definite filling of the gallbladder. This was an unusual pattern with some activity  primarily tracking along the top of the gallbladder fossa but not filling atypical gallbladder shape. Patient has gallstones, but these occupy 20% or less of the gallbladder. We checked with the patient's history, there is no recent meal to indicate that the gallbladder should be contracted. I am uncertain why the gallbladder only seems to partially fill. Using this partially filled appearance, we attempted to carry out a gallbladder ejection fraction. This was not very effective, there is poor emptying of  likely less than 10%. Extensive motion artifact causes the digital subtraction graph and calculated results to be inaccurate. I question the validity of the ejection fraction calculation given the very poor filling of the gallbladder to start with. Normal gallbladder ejection fraction with Ensure is greater than 33% and less than 80%. IMPRESSION: 1. Poor filling of the gallbladder which only occurred between 90 and 120 minutes into the exam, and which was very limited in amount. Morphine was not utilized to encourage gallbladder filling because it would have interfered with the gallbladder ejection fraction calculation. Because the poor filling of the gallbladder, in which it seems only part of the gallbladder fills, ejection fraction calculation is not felt to be valid and cannot be issued. Motion artifact during the ejection fraction calculation only exacerbates this problem. Gallbladder dysfunction cannot be readily excluded. Poor filling of the gallbladder could possibly be from stenosis in the cystic duct although the cystic duct is not felt to be completely obstructed. 2. The patient has known gallstones. 3. The common bile duct is patent. Prompt excretion of radiopharmaceutical into the bowel. 4. Prompt uptake of radiopharmaceutical from the blood pool. Electronically Signed   By: Gaylyn Rong M.D.   On: 01/28/2023 12:48    Anti-infectives: Anti-infectives (From admission, onward)    None        Assessment/Plan Abdominal pain Gallstones - Called to reeavluate abdominal pain after HIDA somewhat abnormal. He still has no RUQ tenderness on exam and complains of pain in the bilateral lower quadrants. He ate a sandwich today without worsening symptoms. He clinically does not have cholecystitis and we would not recommend cholecystectomy. I have added medication PRN for bladder spasms to see if this helps.  CCS will sign off, please call as needed.   ID - none VTE - on eliquis for afib FEN -  IVF, ok for a diet from [facility] standpoint Foley - none   Acute PE/DVT diagnosed 12/10/22 on Eliquis (last dose this morning) Persistent A fib CHF with EF 35-40% (ECHO 12/10/22) HTN HLD Hypothyroidism GERD CAD s/p CABG  I reviewed last 24 h vitals and pain scores, last 48 h intake and output, last 24 h labs and trends, and last 24 h imaging results.    LOS: 0 days    Franne Forts, [facility] 01/28/2023, 3:24 PM Please see Amion for pager number during day hours 7:00am-4:30pm

## 2023-01-29 DIAGNOSIS — R1030 Lower abdominal pain, unspecified: Secondary | ICD-10-CM | POA: Diagnosis not present

## 2023-01-29 LAB — CBC
HCT: 31.2 % — ABNORMAL LOW (ref 39.0–52.0)
Hemoglobin: 9.9 g/dL — ABNORMAL LOW (ref 13.0–17.0)
MCH: 31 pg (ref 26.0–34.0)
MCHC: 31.7 g/dL (ref 30.0–36.0)
MCV: 97.8 fL (ref 80.0–100.0)
Platelets: 201 10*3/uL (ref 150–400)
RBC: 3.19 MIL/uL — ABNORMAL LOW (ref 4.22–5.81)
RDW: 16.6 % — ABNORMAL HIGH (ref 11.5–15.5)
WBC: 4.6 10*3/uL (ref 4.0–10.5)
nRBC: 0 % (ref 0.0–0.2)

## 2023-01-29 MED ORDER — HYDROMORPHONE HCL 1 MG/ML IJ SOLN: 0.5000 mg | INTRAMUSCULAR | Status: DC | PRN

## 2023-01-29 MED ORDER — HYDROCORTISONE 1 % EX CREA
TOPICAL_CREAM | Freq: Three times a day (TID) | CUTANEOUS | Status: DC
  Administered 2023-01-30: 1 via TOPICAL
  Filled 2023-01-29: qty 28

## 2023-01-29 MED ORDER — PANTOPRAZOLE SODIUM 40 MG PO TBEC
40.0000 mg | DELAYED_RELEASE_TABLET | Freq: Two times a day (BID) | ORAL | Status: DC
  Administered 2023-01-29 – 2023-01-30 (×3): 40 mg via ORAL
  Filled 2023-01-29 (×3): qty 1

## 2023-01-29 MED ORDER — OXYCODONE HCL 5 MG PO TABS: 5.0000 mg | ORAL_TABLET | Freq: Four times a day (QID) | ORAL | Status: DC | PRN

## 2023-01-29 NOTE — Plan of Care (Signed)
  Problem: Education: Goal: Knowledge of General Education information will improve Description: Including pain rating scale, medication(s)/side effects and non-pharmacologic comfort measures 01/29/2023 1537 by [place], RN Outcome: Progressing 01/29/2023 1537 by [place], RN Outcome: Progressing   Problem: Health Behavior/Discharge Planning: Goal: Ability to manage health-related needs will improve Outcome: Progressing   Problem: Clinical Measurements: Goal: Ability to maintain clinical measurements within normal limits will improve Outcome: Progressing Goal: Will remain free from infection Outcome: Progressing Goal: Diagnostic test results will improve Outcome: Progressing Goal: Respiratory complications will improve Outcome: Progressing Goal: Cardiovascular complication will be avoided Outcome: Progressing   Problem: Activity: Goal: Risk for activity intolerance will decrease Outcome: Progressing   Problem: Nutrition: Goal: Adequate nutrition will be maintained Outcome: Progressing   Problem: Coping: Goal: Level of anxiety will decrease Outcome: Progressing   Problem: Elimination: Goal: Will not experience complications related to bowel motility Outcome: Progressing Goal: Will not experience complications related to urinary retention Outcome: Progressing   Problem: Pain Management: Goal: General experience of comfort will improve Outcome: Progressing   Problem: Safety: Goal: Ability to remain free from injury will improve Outcome: Progressing   Problem: Skin Integrity: Goal: Risk for impaired skin integrity will decrease Outcome: Progressing

## 2023-01-29 NOTE — Progress Notes (Signed)
PROGRESS NOTE  Bradley Oconnor  RUE:[id number] DOB: December 29, 1940 DOA: 01/26/2023 PCP: [name], MD   Brief Narrative: Patient is a 82 year old male with history of hypertension, hyperlipidemia, hypothyroidism, paroxysmal A-fib, atrial fibrillation, GERD, coronary artery disease status post CABG, chronic systolic CHF, mitral regurgitation who presented with mid abdominal pain that was ongoing for 3 days.  On presentation he was hemodynamically stable.  Lab work showed potassium of 2.9, magnesium of 1.3.  CT abdomen chest which showed small bilateral pleural effusion, cholelithiasis, mild gallbladder distention.  Right upper quadrant ultrasound was suspicious for acute cholecystitis with positive sonographic Murphy sign.  General surgery consulted.  As per general surgery, there is low suspicion for acute cholecystitis. S/P HIDA.  Abdominal pain has resolved.  Possible discharge tomorrow morning  Assessment & Plan:  Principal Problem:   Intractable lower abdominal pain Active Problems:   CAD (coronary artery disease) of artery bypass graft   Hypothyroidism   Hyperlipidemia with target LDL less than 70   GERD (gastroesophageal reflux disease)   HTN (hypertension)   Persistent atrial fibrillation (HCC)   Chronic combined systolic and diastolic CHF (congestive heart failure) (HCC)   Prolonged QT interval   Cholelithiasis  Abdominal pain/cholelithiasis/Suspected acute cholecystitis: Presented with pain in the mid abdomen.  Pain was just above the suprapubic region.  No tenderness on the right upper quadrant.  No fever or leukocytosis. Ultrasound of the abdomen/CT abdomen/pelvis was suspicious for acute cholecystitis but on clinical examination, there was no right upper quadrant tenderness. After discussion with general surgery, we ordered HIDA scan.  Showed poor filling of gallbladder, gallbladder dysfunction not excluded.  We requested general surgery evaluation.  No concern for  cholecystitis.  His abdominal pain has completely resolved today.  He is tolerating diet.  Dark stool: 2 episodes today, very low volume.  No history of hemorrhoids.  Could be from constipation / hard stool or internal hemorrhoids.  Hemorrhoid cream ordered.  Denies any abdominal pain.  Will check CBC today.  Will continue to hold Eliquis.  Started on Protonix.  Hypertension: Currently blood pressure stable.  Antihypertensives on hold  Paroxysmal A-fib: Currently in normal sinus rhythm.  Monitor on telemetry.  Eliquis on hold  Hyperlipidemia: On Crestor  Hypothyroidism: On Synthyroid  Prolonged Qtc: Chronic problem.  Amiodarone was discontinued as an outpatient.  Coronary artery disease: Status post CABG.  No anginal symptoms.  Chronic systolic CHF/moderate to severe mitral regurgitation: Currently euvolemic.  Last echo showed EF of 35 to 40%, moderately reduced right ventricular function, moderate to severe mitral valve regurgitation.  Follows with cardiology.  Takes torsemide at home  Hypokalemia/hypomagnesemia: Supplemented and corrected    Pressure Injury 01/26/23 Coccyx Medial Stage 1 -  Intact skin with non-blanchable redness of a localized area usually over a bony prominence. (Active)  01/26/23 1847  Location: Coccyx  Location Orientation: Medial  Staging: Stage 1 -  Intact skin with non-blanchable redness of a localized area usually over a bony prominence.  Wound Description (Comments):   Present on Admission: Yes  Dressing Type Foam - Lift dressing to assess site every shift 01/28/23 2100    DVT prophylaxis:SCDs Start: 01/26/23 1656     Code Status: Full Code  Family Communication: Discussed with brother on phone on 11/1  Patient status:Obs   Patient is from :home  Anticipated discharge JY:NWGN  Estimated DC date: Tomorrow  Consultants: General Surgery  Procedures: None  Antimicrobials:  Anti-infectives (From admission, onward)    None  Subjective:  Patient seen and examined the bedside today.  Hemodynamically stable.  Overall comfortable.  Eating his breakfast.  Denies any abdomen pain.  Had a small hard stool this morning.  Nurse reported that there was some dark blood at times with the stool.  No frank bleeding.  Objective: Vitals:   01/28/23 1541 01/28/23 2001 01/29/23 0452 01/29/23 0726  BP: (!) 98/54 102/62 110/65 (!) 111/59  Pulse: 79 73 68 66  Resp: 17 16 16 17   Temp: 97.6 F (36.4 C) 98 F (36.7 C) 98.2 F (36.8 C) (!) 97.4 F (36.3 C)  TempSrc:  Oral Oral   SpO2: 100% 99% 97% 100%  Weight:   38.2 kg   Height:        Intake/Output Summary (Last 24 hours) at 01/29/2023 1049 Last data filed at 01/29/2023 0500 Gross per 24 hour  Intake 240 ml  Output 200 ml  Net 40 ml   Filed Weights   01/26/23 1009 01/28/23 0328 01/29/23 0452  Weight: 67 kg 60.9 kg 38.2 kg    Examination:  General exam: Overall comfortable, not in distress, pleasant elderly male HEENT: PERRL Respiratory system:  no wheezes or crackles  Cardiovascular system: S1 & S2 heard, RRR.  Gastrointestinal system: Abdomen is nondistended, soft and nontender. Central nervous system: Alert and oriented Extremities: No edema, no clubbing ,no cyanosis Skin: No rashes, no ulcers,no icterus     Data Reviewed: I have personally reviewed following labs and imaging studies  CBC: Recent Labs  Lab 01/26/23 1026 01/27/23 0852  WBC 5.9 5.5  NEUTROABS 2.7  --   HGB 11.6* 10.4*  HCT 35.8* 33.0*  MCV 96.0 97.3  PLT 222 210   Basic Metabolic Panel: Recent Labs  Lab 01/26/23 1026 01/27/23 0852 01/28/23 1337  NA 142 141 141  K 2.9* 3.3* 3.9  CL 104 105 106  CO2 25 29 25   GLUCOSE 127* 105* 125*  BUN 8 7* 11  CREATININE 0.88 0.85 0.95  CALCIUM 8.2* 8.2* 8.6*  MG 1.3* 1.2* 2.1     No results found for this or any previous visit (from the past 240 hour(s)).   Radiology Studies: NM Hepato W/EF  Result Date:  01/28/2023 CLINICAL DATA:  Cholecystitis, cholelithiasis EXAM: NUCLEAR MEDICINE HEPATOBILIARY IMAGING WITH GALLBLADDER EF TECHNIQUE: Sequential images of the abdomen were obtained out to 60 minutes following intravenous administration of radiopharmaceutical. After oral ingestion of Ensure, gallbladder ejection fraction was determined. At 60 min, normal ejection fraction is greater than 33%. RADIOPHARMACEUTICALS:  5.2 mCi Tc-63m  Choletec IV COMPARISON:  Ultrasound and CT examinations from 01/26/2023 FINDINGS: Satisfactory uptake of radiopharmaceutical from the blood pool. Biliary activity visible by 5 minutes, bowel activity seen at 15 minutes. Initial non filling of the gallbladder over the first 60 minutes. Because today's exam was requested as a check for gallbladder ejection fraction rather than assessment for cholecystitis, administration of morphine to encourage gallbladder filling was not feasible as it would interfere with the ejection fraction calculation. Accordingly, I elected to continue imaging the patient out and eventually at about 90 minutes we started to demonstrate definite filling of the gallbladder. This was an unusual pattern with some activity primarily tracking along the top of the gallbladder fossa but not filling atypical gallbladder shape. Patient has gallstones, but these occupy 20% or less of the gallbladder. We checked with the patient's history, there is no recent meal to indicate that the gallbladder should be contracted. I am uncertain why the gallbladder only  seems to partially fill. Using this partially filled appearance, we attempted to carry out a gallbladder ejection fraction. This was not very effective, there is poor emptying of likely less than 10%. Extensive motion artifact causes the digital subtraction graph and calculated results to be inaccurate. I question the validity of the ejection fraction calculation given the very poor filling of the gallbladder to start with.  Normal gallbladder ejection fraction with Ensure is greater than 33% and less than 80%. IMPRESSION: 1. Poor filling of the gallbladder which only occurred between 90 and 120 minutes into the exam, and which was very limited in amount. Morphine was not utilized to encourage gallbladder filling because it would have interfered with the gallbladder ejection fraction calculation. Because the poor filling of the gallbladder, in which it seems only part of the gallbladder fills, ejection fraction calculation is not felt to be valid and cannot be issued. Motion artifact during the ejection fraction calculation only exacerbates this problem. Gallbladder dysfunction cannot be readily excluded. Poor filling of the gallbladder could possibly be from stenosis in the cystic duct although the cystic duct is not felt to be completely obstructed. 2. The patient has known gallstones. 3. The common bile duct is patent. Prompt excretion of radiopharmaceutical into the bowel. 4. Prompt uptake of radiopharmaceutical from the blood pool. Electronically Signed   By: Gaylyn Rong M.D.   On: 01/28/2023 12:48    Scheduled Meds:  hydrocortisone cream   Topical TID   pantoprazole  40 mg Oral BID   rosuvastatin  10 mg Oral Daily   sodium chloride flush  3 mL Intravenous Q12H   Continuous Infusions:   LOS: 0 days   Burnadette Pop, MD [facility] P11/03/2022, 10:49 AM

## 2023-01-29 NOTE — Plan of Care (Signed)
  Problem: Education: Goal: Knowledge of General Education information will improve Description: Including pain rating scale, medication(s)/side effects and non-pharmacologic comfort measures Outcome: Progressing   Problem: Elimination: Goal: Will not experience complications related to bowel motility Outcome: Progressing   Problem: Pain Management: Goal: General experience of comfort will improve Outcome: Progressing

## 2023-01-30 ENCOUNTER — Encounter: Payer: Self-pay | Admitting: Internal Medicine

## 2023-01-30 DIAGNOSIS — R1030 Lower abdominal pain, unspecified: Secondary | ICD-10-CM | POA: Diagnosis not present

## 2023-01-30 LAB — CBC
HCT: 31.9 % — ABNORMAL LOW (ref 39.0–52.0)
Hemoglobin: 10.2 g/dL — ABNORMAL LOW (ref 13.0–17.0)
MCH: 31.4 pg (ref 26.0–34.0)
MCHC: 32 g/dL (ref 30.0–36.0)
MCV: 98.2 fL (ref 80.0–100.0)
Platelets: 208 10*3/uL (ref 150–400)
RBC: 3.25 MIL/uL — ABNORMAL LOW (ref 4.22–5.81)
RDW: 16.5 % — ABNORMAL HIGH (ref 11.5–15.5)
WBC: 5 10*3/uL (ref 4.0–10.5)
nRBC: 0 % (ref 0.0–0.2)

## 2023-01-30 MED ORDER — PANTOPRAZOLE SODIUM 40 MG PO TBEC: 40.0000 mg | DELAYED_RELEASE_TABLET | Freq: Every day | ORAL | 0 refills | Status: DC

## 2023-01-30 MED ORDER — OXYBUTYNIN CHLORIDE 5 MG PO TABS: 5.0000 mg | ORAL_TABLET | Freq: Three times a day (TID) | ORAL | 0 refills | Status: DC | PRN

## 2023-01-30 MED ORDER — SENNA 8.6 MG PO TABS: 1.0000 | ORAL_TABLET | Freq: Every day | ORAL | 0 refills | Status: DC

## 2023-01-30 NOTE — Plan of Care (Signed)
Understands rationale for discharge

## 2023-01-30 NOTE — Discharge Summary (Addendum)
Physician Discharge Summary  DANFORD TAT ZOX:[id number] DOB: 09/04/1940 DOA: 01/26/2023  PCP: [name], MD  Admit date: 01/26/2023 Discharge date: 01/30/2023  Admitted From: Home Disposition:  Home  Discharge Condition:Stable CODE STATUS:FULL Diet recommendation: Heart Healthy   Brief/Interim Summary: Patient is a 82 year old male with history of hypertension, hyperlipidemia, hypothyroidism, paroxysmal A-fib, atrial fibrillation, GERD, coronary artery disease status post CABG, chronic systolic CHF, mitral regurgitation who presented with mid abdominal pain that was ongoing for 3 days.  On presentation he was hemodynamically stable.  Lab work showed potassium of 2.9, magnesium of 1.3.  CT abdomen chest which showed small bilateral pleural effusion, cholelithiasis, mild gallbladder distention.  Right upper quadrant ultrasound was suspicious for acute cholecystitis with positive sonographic Murphy sign.  General surgery consulted.  As per general surgery, there is low suspicion for acute cholecystitis. S/P HIDA.  Abdominal pain has resolved.  Medically stable for discharge home today.  Following problems were addressed during the hospitalization:  Abdominal pain/cholelithiasis/Suspected acute cholecystitis: Presented with pain in the mid abdomen.  Pain was just above the suprapubic region.  No tenderness on the right upper quadrant.  No fever or leukocytosis. Ultrasound of the abdomen/CT abdomen/pelvis was suspicious for acute cholecystitis but on clinical examination, there was no right upper quadrant tenderness. After discussion with general surgery, we ordered HIDA scan.  Showed poor filling of gallbladder, gallbladder dysfunction not excluded.  We requested general surgery evaluation.  No concern for cholecystitis.  His abdominal pain has completely resolved today.  He is tolerating diet.  Continue Ditropan as needed   Dark stool: 2 episodes today, very low volume.  No history of  hemorrhoids.  Could be from constipation / hard stool or internal hemorrhoids.  Hemorrhoid cream ordered.  Denies any abdominal pain.  Started on Protonix.  Hemoglobin stable  Paroxysmal A-fib: Currently in normal sinus rhythm.  Eliquis to be continued    Hypothyroidism: On Synthyroid   Prolonged Qtc: Chronic problem.  Amiodarone was discontinued as an outpatient.   Coronary artery disease: Status post CABG.  No anginal symptoms.   Chronic systolic CHF/moderate to severe mitral regurgitation: Currently euvolemic.  Last echo showed EF of 35 to 40%, moderately reduced right ventricular function, moderate to severe mitral valve regurgitation.  Follows with cardiology.  Takes torsemide at home   Hypokalemia/hypomagnesemia: Supplemented and corrected   Discharge Diagnoses:  Principal Problem:   Intractable lower abdominal pain Active Problems:   CAD (coronary artery disease) of artery bypass graft   Hypothyroidism   Hyperlipidemia with target LDL less than 70   GERD (gastroesophageal reflux disease)   HTN (hypertension)   Persistent atrial fibrillation (HCC)   Chronic combined systolic and diastolic CHF (congestive heart failure) (HCC)   Prolonged QT interval   Cholelithiasis    Discharge Instructions  Discharge Instructions     Diet - low sodium heart healthy   Complete by: As directed    Discharge instructions   Complete by: As directed    1) Please take your medications as instructed 2)Follow up with your PCP in a week   Increase activity slowly   Complete by: As directed    No wound care   Complete by: As directed       Allergies as of 01/30/2023       Reactions   Procardia [nifedipine] Other (See Comments)   Hypotension   Phenergan [promethazine Hcl] Nausea And Vomiting        Medication List  STOP taking these medications    amoxicillin-clavulanate 875-125 MG tablet Commonly known as: AUGMENTIN       TAKE these medications    acetaminophen 325  MG tablet Commonly known as: TYLENOL Take 650 mg by mouth every 6 (six) hours as needed.   cyanocobalamin 1000 MCG tablet Commonly known as: VITAMIN B12 Take 1 tablet (1,000 mcg total) by mouth daily.   Eliquis 5 MG Tabs tablet Generic drug: apixaban Take 1 tablet (5 mg total) by mouth 2 (two) times daily.   folic acid 1 MG tablet Commonly known as: FOLVITE Take 1 tablet (1 mg total) by mouth daily.   Melatonin 10 MG Tabs Take 10 mg by mouth at bedtime as needed.   mirtazapine 15 MG disintegrating tablet Commonly known as: REMERON SOL-TAB Take 1 tablet (15 mg total) by mouth at bedtime.   oxybutynin 5 MG tablet Commonly known as: DITROPAN Take 1 tablet (5 mg total) by mouth every 8 (eight) hours as needed for bladder spasms.   pantoprazole 40 MG tablet Commonly known as: PROTONIX Take 1 tablet (40 mg total) by mouth daily.   potassium chloride SA 20 MEQ tablet Commonly known as: KLOR-CON M Take 20 mEq by mouth daily.   psyllium 58.6 % packet Commonly known as: METAMUCIL Take 1 packet by mouth daily.   senna 8.6 MG Tabs tablet Commonly known as: SENOKOT Take 1 tablet (8.6 mg total) by mouth daily.   torsemide 20 MG tablet Commonly known as: DEMADEX Take 1 tablet (20 mg total) by mouth daily.        Follow-up Information     [name], MD. Schedule an appointment as soon as possible for a visit in 1 week(s).   Specialties: Family Medicine, Sports Medicine Contact information: [address] [place] [address] [phone]                Allergies  Allergen Reactions   Procardia [Nifedipine] Other (See Comments)    Hypotension    Phenergan [Promethazine Hcl] Nausea And Vomiting    Consultations: General Surgery   Procedures/Studies: NM Hepato W/EF  Result Date: 01/28/2023 CLINICAL DATA:  Cholecystitis, cholelithiasis EXAM: NUCLEAR MEDICINE HEPATOBILIARY IMAGING WITH GALLBLADDER EF TECHNIQUE: Sequential images of the  abdomen were obtained out to 60 minutes following intravenous administration of radiopharmaceutical. After oral ingestion of Ensure, gallbladder ejection fraction was determined. At 60 min, normal ejection fraction is greater than 33%. RADIOPHARMACEUTICALS:  5.2 mCi Tc-23m  Choletec IV COMPARISON:  Ultrasound and CT examinations from 01/26/2023 FINDINGS: Satisfactory uptake of radiopharmaceutical from the blood pool. Biliary activity visible by 5 minutes, bowel activity seen at 15 minutes. Initial non filling of the gallbladder over the first 60 minutes. Because today's exam was requested as a check for gallbladder ejection fraction rather than assessment for cholecystitis, administration of morphine to encourage gallbladder filling was not feasible as it would interfere with the ejection fraction calculation. Accordingly, I elected to continue imaging the patient out and eventually at about 90 minutes we started to demonstrate definite filling of the gallbladder. This was an unusual pattern with some activity primarily tracking along the top of the gallbladder fossa but not filling atypical gallbladder shape. Patient has gallstones, but these occupy 20% or less of the gallbladder. We checked with the patient's history, there is no recent meal to indicate that the gallbladder should be contracted. I am uncertain why the gallbladder only seems to partially fill. Using this partially filled appearance, we  attempted to carry out a gallbladder ejection fraction. This was not very effective, there is poor emptying of likely less than 10%. Extensive motion artifact causes the digital subtraction graph and calculated results to be inaccurate. I question the validity of the ejection fraction calculation given the very poor filling of the gallbladder to start with. Normal gallbladder ejection fraction with Ensure is greater than 33% and less than 80%. IMPRESSION: 1. Poor filling of the gallbladder which only occurred between  90 and 120 minutes into the exam, and which was very limited in amount. Morphine was not utilized to encourage gallbladder filling because it would have interfered with the gallbladder ejection fraction calculation. Because the poor filling of the gallbladder, in which it seems only part of the gallbladder fills, ejection fraction calculation is not felt to be valid and cannot be issued. Motion artifact during the ejection fraction calculation only exacerbates this problem. Gallbladder dysfunction cannot be readily excluded. Poor filling of the gallbladder could possibly be from stenosis in the cystic duct although the cystic duct is not felt to be completely obstructed. 2. The patient has known gallstones. 3. The common bile duct is patent. Prompt excretion of radiopharmaceutical into the bowel. 4. Prompt uptake of radiopharmaceutical from the blood pool. Electronically Signed   By: Gaylyn Rong M.D.   On: 01/28/2023 12:48   US Abdomen Limited RUQ (LIVER/GB)  Result Date: 01/26/2023 CLINICAL DATA:  Right upper quadrant abdominal pain EXAM: ULTRASOUND ABDOMEN LIMITED RIGHT UPPER QUADRANT COMPARISON:  None Available. FINDINGS: Gallbladder: Distended gallbladder. Layering stones. No wall thickening visualized. Positive sonographic Murphy sign noted by sonographer. Common bile duct: Not well seen. Liver: No focal lesion identified. Within normal limits in parenchymal echogenicity. Portal vein is patent on color Doppler imaging with normal direction of blood flow towards the liver. Other: Partially imaged right pleural effusion. IMPRESSION: Distended gallbladder containing layering stones with positive sonographic Murphy's sign, suspicious for acute cholecystitis. Electronically Signed   By: Agustin Cree M.D.   On: 01/26/2023 14:53   CT ABDOMEN PELVIS WO CONTRAST  Result Date: 01/26/2023 CLINICAL DATA:  Left lower quadrant abdominal pain EXAM: CT ABDOMEN AND PELVIS WITHOUT CONTRAST TECHNIQUE: Multidetector  CT imaging of the abdomen and pelvis was performed following the standard protocol without IV contrast. RADIATION DOSE REDUCTION: This exam was performed according to the departmental dose-optimization program which includes automated exposure control, adjustment of the mA and/or kV according to patient size and/or use of iterative reconstruction technique. COMPARISON:  12/29/2022 FINDINGS: Lower chest: Small bilateral pleural effusions, decreased since prior study. Minimal dependent atelectasis. Coronary artery and aortic atherosclerosis. Hepatobiliary: Layering stones within the gallbladder which is mildly distended. No biliary ductal dilatation. No visible ductal stones. Pancreas: No focal abnormality or ductal dilatation. Spleen: No focal abnormality.  Normal size. Adrenals/Urinary Tract: No adrenal abnormality. No focal renal abnormality. No stones or hydronephrosis. Urinary bladder is unremarkable. Stomach/Bowel: Stomach, large and small bowel grossly unremarkable. Vascular/Lymphatic: Diffuse aortoiliac atherosclerosis. No evidence of aneurysm or adenopathy. Reproductive: No visible focal abnormality. Other: No free fluid or free air. Musculoskeletal: No acute bony abnormality. IMPRESSION: Small bilateral pleural effusions, decreased since prior study. Layering cholelithiasis. Mild distention of the gallbladder, slightly decreased since prior study. Coronary artery disease, aortic atherosclerosis. No acute findings. Electronically Signed   By: Charlett Nose M.D.   On: 01/26/2023 12:40      Subjective: Patient seen and examined at bedside today.  Hemodynamically stable.  Comfortable.  Tolerating diet.  Had a bowel movement  last night.  It was nonbloody.  Denies any abdomen pain, nausea or vomiting today.  Medically stable for discharge home.I called and discussed with his brother Randal  about discharge planning.  Discharge Exam: Vitals:   01/30/23 0350 01/30/23 0720  BP: 108/63 105/70  Pulse: 73 73   Resp: 18 17  Temp: 98 F (36.7 C) 98 F (36.7 C)  SpO2: 99% 100%   Vitals:   01/29/23 2000 01/30/23 0350 01/30/23 0412 01/30/23 0720  BP: 99/71 108/63  105/70  Pulse: 70 73  73  Resp: 20 18  17   Temp: 98 F (36.7 C) 98 F (36.7 C)  98 F (36.7 C)  TempSrc: Oral Oral    SpO2: 98% 99%  100%  Weight:   63 kg   Height:        General: Pt is alert, awake, not in acute distress Cardiovascular: RRR, S1/S2 +, no rubs, no gallops Respiratory: CTA bilaterally, no wheezing, no rhonchi Abdominal: Soft, NT, ND, bowel sounds + Extremities: no edema, no cyanosis    The results of significant diagnostics from this hospitalization (including imaging, microbiology, ancillary and laboratory) are listed below for reference.     Microbiology: No results found for this or any previous visit (from the past 240 hour(s)).   Labs: BNP (last 3 results) Recent Labs    12/17/22 0323 12/18/22 0417 12/19/22 0837  BNP 1,309.5* 878.0* 719.9*   Basic Metabolic Panel: Recent Labs  Lab 01/26/23 1026 01/27/23 0852 01/28/23 1337  NA 142 141 141  K 2.9* 3.3* 3.9  CL 104 105 106  CO2 25 29 25   GLUCOSE 127* 105* 125*  BUN 8 7* 11  CREATININE 0.88 0.85 0.95  CALCIUM 8.2* 8.2* 8.6*  MG 1.3* 1.2* 2.1   Liver Function Tests: Recent Labs  Lab 01/26/23 1026 01/27/23 0852 01/28/23 1337  AST 22 16 18   ALT 15 14 13   ALKPHOS 62 58 61  BILITOT 0.7 0.8 0.9  PROT 5.9* 5.1* 5.8*  ALBUMIN 2.7* 2.3* 2.7*   Recent Labs  Lab 01/26/23 1026  LIPASE 34   No results for input(s): "AMMONIA" in the last 168 hours. CBC: Recent Labs  Lab 01/26/23 1026 01/27/23 0852 01/29/23 1044 01/30/23 0423  WBC 5.9 5.5 4.6 5.0  NEUTROABS 2.7  --   --   --   HGB 11.6* 10.4* 9.9* 10.2*  HCT 35.8* 33.0* 31.2* 31.9*  MCV 96.0 97.3 97.8 98.2  PLT 222 210 201 208   Cardiac Enzymes: No results for input(s): "CKTOTAL", "CKMB", "CKMBINDEX", "TROPONINI" in the last 168 hours. BNP: Invalid input(s):  "POCBNP" CBG: No results for input(s): "GLUCAP" in the last 168 hours. D-Dimer No results for input(s): "DDIMER" in the last 72 hours. Hgb A1c No results for input(s): "HGBA1C" in the last 72 hours. Lipid Profile No results for input(s): "CHOL", "HDL", "LDLCALC", "TRIG", "CHOLHDL", "LDLDIRECT" in the last 72 hours. Thyroid function studies No results for input(s): "TSH", "T4TOTAL", "T3FREE", "THYROIDAB" in the last 72 hours.  Invalid input(s): "FREET3" Anemia work up No results for input(s): "VITAMINB12", "FOLATE", "FERRITIN", "TIBC", "IRON", "RETICCTPCT" in the last 72 hours. Urinalysis    Component Value Date/Time   COLORURINE YELLOW 01/26/2023 1015   APPEARANCEUR CLEAR 01/26/2023 1015   LABSPEC 1.006 01/26/2023 1015   PHURINE 7.0 01/26/2023 1015   GLUCOSEU NEGATIVE 01/26/2023 1015   HGBUR NEGATIVE 01/26/2023 1015   BILIRUBINUR NEGATIVE 01/26/2023 1015   BILIRUBINUR small 04/17/2014 1515   KETONESUR NEGATIVE 01/26/2023 1015  PROTEINUR NEGATIVE 01/26/2023 1015   UROBILINOGEN 0.2 04/17/2014 1515   UROBILINOGEN 0.2 08/30/2009 1137   NITRITE NEGATIVE 01/26/2023 1015   LEUKOCYTESUR LARGE (A) 01/26/2023 1015   Sepsis Labs Recent Labs  Lab 01/26/23 1026 01/27/23 0852 01/29/23 1044 01/30/23 0423  WBC 5.9 5.5 4.6 5.0   Microbiology No results found for this or any previous visit (from the past 240 hour(s)).  Please note: You were cared for by a hospitalist during your hospital stay. Once you are discharged, your primary care physician will handle any further medical issues. Please note that NO REFILLS for any discharge medications will be authorized once you are discharged, as it is imperative that you return to your primary care physician (or establish a relationship with a primary care physician if you do not have one) for your post hospital discharge needs so that they can reassess your need for medications and monitor your lab values.    Time coordinating discharge: 40  minutes  SIGNED:   Burnadette Pop, MD  [facility] 01/30/2023, 9:46 AM Pager [phone]  If 7PM-7AM, please contact night-coverage [email or web address] Password [email or web address]

## 2023-02-05 DIAGNOSIS — N393 Stress incontinence (female) (male): Secondary | ICD-10-CM | POA: Diagnosis not present

## 2023-02-05 DIAGNOSIS — Z136 Encounter for screening for cardiovascular disorders: Secondary | ICD-10-CM | POA: Diagnosis not present

## 2023-02-05 DIAGNOSIS — I509 Heart failure, unspecified: Secondary | ICD-10-CM | POA: Diagnosis not present

## 2023-02-11 ENCOUNTER — Ambulatory Visit: Payer: PPO | Admitting: Family Medicine

## 2023-02-11 ENCOUNTER — Telehealth: Payer: Self-pay

## 2023-02-11 ENCOUNTER — Encounter: Payer: Self-pay | Admitting: Family Medicine

## 2023-02-11 VITALS — BP 110/68 | HR 89 | Temp 98.0°F | Ht 68.0 in

## 2023-02-11 DIAGNOSIS — E876 Hypokalemia: Secondary | ICD-10-CM | POA: Diagnosis not present

## 2023-02-11 DIAGNOSIS — R109 Unspecified abdominal pain: Secondary | ICD-10-CM

## 2023-02-11 DIAGNOSIS — R413 Other amnesia: Secondary | ICD-10-CM

## 2023-02-11 DIAGNOSIS — I4891 Unspecified atrial fibrillation: Secondary | ICD-10-CM | POA: Diagnosis not present

## 2023-02-11 DIAGNOSIS — I5042 Chronic combined systolic (congestive) and diastolic (congestive) heart failure: Secondary | ICD-10-CM | POA: Diagnosis not present

## 2023-02-11 LAB — COMPREHENSIVE METABOLIC PANEL
ALT: 8 U/L (ref 0–53)
AST: 15 U/L (ref 0–37)
Albumin: 3.4 g/dL — ABNORMAL LOW (ref 3.5–5.2)
Alkaline Phosphatase: 63 U/L (ref 39–117)
BUN: 12 mg/dL (ref 6–23)
CO2: 31 meq/L (ref 19–32)
Calcium: 8.6 mg/dL (ref 8.4–10.5)
Chloride: 99 meq/L (ref 96–112)
Creatinine, Ser: 0.98 mg/dL (ref 0.40–1.50)
GFR: 71.31 mL/min (ref >60.00)
Glucose, Bld: 95 mg/dL (ref 70–99)
Potassium: 3.3 meq/L — ABNORMAL LOW (ref 3.5–5.1)
Sodium: 141 meq/L (ref 135–145)
Total Bilirubin: 0.7 mg/dL (ref 0.2–1.2)
Total Protein: 6.3 g/dL (ref 6.0–8.3)

## 2023-02-11 LAB — VITAMIN B12: Vitamin B-12: 1275 pg/mL — ABNORMAL HIGH (ref 211–911)

## 2023-02-11 LAB — MAGNESIUM: Magnesium: 1 mg/dL — CL (ref 1.5–2.5)

## 2023-02-11 MED ORDER — SLOW-MAG 71.5-119 MG PO TBEC: 1.0000 | DELAYED_RELEASE_TABLET | Freq: Two times a day (BID) | ORAL | 0 refills | Status: DC

## 2023-02-11 NOTE — Progress Notes (Signed)
Subjective:  Patient ID: Bradley Oconnor, male    DOB: 03/24/1941  Age: 82 y.o. MRN: [id number]  CC:  Chief Complaint  Patient presents with   Hospitalization Follow-up    New nurse Bradley Oconnor is here with pt today, Pt had spastic bladder went to ED for this, notes he is doing better no longer bed bound and has been going to PT which has much improved his mobility     HPI Bradley Oconnor presents for  Hospital follow-up.  Last visit with me in May 2023.  Multiple hospital visits, ER visits since June. Was admitted at [facility] in June through July for 28 days for respiratory failure with hypoxia, COVID-19, pneumonia, acute on chronic CHF, protein calorie malnutrition, acute kidney failure.  Discharged to rehab facility.  ER visit August 3 with acute cystitis, noted to have hypokalemia which was repleted at that time.  Slight elevated lipase but inconsistent with pancreatitis.  Treated with Rocephin, Cipro, Zofran, potassium 20 meq every day.   Admitted to the hospital again from September 11 through September 23rd. Due to confusion, weakness, and diagnosed with sepsis due to pneumonia, PE, DVT and admitted to the ICU.  Noted to be in volume overload, acute systolic CHF, and A-fib with RVR.  Treated with Zosyn for 7-day course for septic shock, bilateral lower lobe pneumonia, UTI.  Blood pressure had improved on midodrine.  EF 35% on echo with global hypokinesis.  Required aggressive IV diuresis with improvement in volume and was discharged on torsemide 20 mg daily.  Converted from A-fib to sinus rhythm with amiodarone.  Prolonged QT noted but amiodarone dose was decreased and planned same dose as outpatient with cardiology monitoring outpatient and Eliquis for anticoagulation.  Previously on Lexapro, discharged due to his prolonged QT.  Noted to have acute metabolic encephalopathy and delirium, B12 deficiency, treated with B12 supplement.  CT head without acute findings with  stable changes including brain atrophy and old stroke.  Similar findings on MRI.  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  Remeron dose was increased.  This was also used for stimulate appetite given his protein calorie malnutrition.  Supplements recommended after SLP consult for calorie and protein intake.  On Eliquis for pulmonary emboli and DVT along with A-fib.   ER visit for abdominal pain October 1.Possible cystitis.  Gallstones noted but no upper abdominal tenderness.  Treated with cephalexin after IV fluids.  Most recent hospital admission October 29 through November 2.  Presented with mid abdominal pain.  CT scan suspicious for acute cholecystitis but no right upper quadrant tenderness.  HIDA scan performed with poor filling of gallbladder.  General surgery consult, no concern for cholecystitis and pain had resolved.  Continued on Ditropan as needed.  Started on Protonix.  Amiodarone has been discontinued with his history of prolonged QTc.  Sinus rhythm during that admission, continue on Eliquis.  Euvolemic in regards to his CHF, continued on torsemide.  Hypokalemia, hypomagnesemia were supplemented.   Feeling better since d/c from hospital 12 days ago.  No recurrence of abd pain.  Here with nurse Bradley Oconnor - full time nurse providing Oconnor at home - she has been with him for 9 days.  Has been improving - not bed bound - up to wheelchair, walking 40 feet. Eating 3 meals per day, some snacks in between.  Bedside commode. No  assistance devices needed. New wheelchair on order.  No CP/palpitations/dyspnea.  No lightheadedness/dizziness/or near syncope.  Home BP's  were running lower - now on 1/2 tablet torsemide past few days.  No new leg swelling on lower dose.  BP Readings from Last 3 Encounters:  02/11/23 110/68  01/30/23 105/70  01/18/23 92/64  Some  sundowning at times per Bradley Oconnor. Remeron helps some and with appetite.  Some irritation at times.  Echo planned in few weeks. Cardiology appt in January.  Normal bowel movements.      02/11/2023   11:20 AM 07/16/2022    4:17 PM 08/11/2021   11:41 AM 08/07/2021   12:56 PM 03/03/2021   11:07 AM  Depression screen PHQ 2/9  Decreased Interest 0 0 1 0 0  Down, Depressed, Hopeless 0 0 0 0 0  PHQ - 2 Score 0 0 1 0 0  Altered sleeping 0 0 0    Tired, decreased energy 0 0 1    Change in appetite 0 0 1    Feeling bad or failure about yourself  0 0 0    Trouble concentrating 0 0 0    Moving slowly or fidgety/restless 0 0 0    Suicidal thoughts 0 0 0    PHQ-9 Score 0 0 3    Difficult doing work/chores  Not difficult at all                History Patient Active Problem List   Diagnosis Date Noted   Cholelithiasis 01/26/2023   Intractable lower abdominal pain 01/26/2023   Prolonged QT interval 12/21/2022   Malnutrition of moderate degree 12/19/2022   Persistent atrial fibrillation (HCC) 12/15/2022   Chronic combined systolic and diastolic CHF (congestive heart failure) (HCC) 12/15/2022   Pressure injury of skin 12/15/2022   Low back pain 08/07/2020   Trigger finger of right thumb 04/17/2020   Carpal tunnel syndrome, bilateral 09/26/2019   Shoulder pain, bilateral 08/03/2019   Cervicalgia 03/28/2018   GERD (gastroesophageal reflux disease) 04/10/2013   HTN (hypertension) 04/10/2013   CAD (coronary artery disease) of artery bypass graft 10/04/2012   Hypothyroidism 10/04/2012   Hyperlipidemia with target LDL less than 70 10/04/2012   Kidney stones 01/27/2012   Hx of appendectomy-history of ruptured requiring ileocecectomy (~1994) 01/27/2012   Past Medical History:  Diagnosis Date   Blood transfusion without reported diagnosis    CAD (coronary artery disease)    GERD (gastroesophageal reflux disease)    Heart attack (HCC)    Hyperlipidemia    Hypertension 03/07/2012    ECHO-WNL     08/12/11 Lexiscan MyoviewNo significant ischemia demonstrated Low risk scan There is a moderate sized dense scar in the LCX territoy unchanged from the prior study.. Post- stress EF is 40%.   Septic shock (HCC) 12/10/2022   Thyroid disease    Past Surgical History:  Procedure Laterality Date   APPENDECTOMY     CERVICAL SPINE SURGERY     titanium plate in the back of neck   CORONARY ARTERY BYPASS GRAFT     HERNIA REPAIR     SMALL INTESTINE SURGERY     THYROID SURGERY     1/2 thyroid removed on right side   Allergies  Allergen Reactions   Procardia [Nifedipine] Other (See Comments)    Hypotension    Phenergan [Promethazine Hcl] Nausea And Vomiting   Prior to Admission medications   Medication Sig Start Date End Date Taking? Authorizing Provider  acetaminophen (TYLENOL) 325 MG tablet  Take 650 mg by mouth every 6 (six) hours as needed.   Yes [provider]  apixaban (ELIQUIS) 5 MG TABS tablet Take 1 tablet (5 mg total) by mouth 2 (two) times daily. 01/19/23  Yes [name], MD  cyanocobalamin (VITAMIN B12) 1000 MCG tablet Take 1 tablet (1,000 mcg total) by mouth daily. 01/19/23  Yes [name], MD  folic acid (FOLVITE) 1 MG tablet Take 1 tablet (1 mg total) by mouth daily. 12/22/22  Yes Elgergawy, Leana Roe, MD  Melatonin 10 MG TABS Take 10 mg by mouth at bedtime as needed.   Yes [provider]  mirtazapine (REMERON SOL-TAB) 15 MG disintegrating tablet Take 1 tablet (15 mg total) by mouth at bedtime. 01/19/23  Yes [name], MD  oxybutynin (DITROPAN) 5 MG tablet Take 1 tablet (5 mg total) by mouth every 8 (eight) hours as needed for bladder spasms. 01/30/23  Yes Burnadette Pop, MD  pantoprazole (PROTONIX) 40 MG tablet Take 1 tablet (40 mg total) by mouth daily. 01/30/23  Yes Adhikari, Willia Craze, MD  potassium chloride SA (KLOR-CON M) 20 MEQ tablet Take 20 mEq by mouth daily.   Yes [provider]  psyllium (METAMUCIL) 58.6 % packet  Take 1 packet by mouth daily.   Yes [provider]  senna (SENOKOT) 8.6 MG TABS tablet Take 1 tablet (8.6 mg total) by mouth daily. 01/30/23  Yes Burnadette Pop, MD  torsemide (DEMADEX) 20 MG tablet Take 1 tablet (20 mg total) by mouth daily. 01/19/23  Yes [name], MD  escitalopram (LEXAPRO) 10 MG tablet Take 1 tablet by mouth daily.    [provider]   Social History   Socioeconomic History   Marital status: Married    Spouse name: Not on file   Number of children: Not on file   Years of education: 16   Highest education level: Some college, no degree  Occupational History   Occupation: Retired  Tobacco Use   Smoking status: Never   Smokeless tobacco: Never  Vaping Use   Vaping status: Never Used  Substance and Sexual Activity   Alcohol use: No    Alcohol/week: 0.0 standard drinks of alcohol   Drug use: No   Sexual activity: Never  Other Topics Concern   Not on file  Social History Narrative   Married.    Social Determinants of Health   Financial Resource Strain: Low Risk  (07/16/2022)   Overall Financial Resource Strain (CARDIA)    Difficulty of Paying Living Expenses: Not hard at all  Food Insecurity: No Food Insecurity (01/26/2023)   Hunger Vital Sign    Worried About Running Out of Food in the Last Year: Never true    Ran Out of Food in the Last Year: Never true  Transportation Needs: No Transportation Needs (01/26/2023)   PRAPARE - Administrator, Civil Service (Medical): No    Lack of Transportation (Non-Medical): No  Physical Activity: Insufficiently Active (07/16/2022)   Exercise Vital Sign    Days of Exercise per Week: 3 days    Minutes of Exercise per Session: 30 min  Stress: No Stress Concern Present (07/16/2022)   [employer] of Occupational Health - Occupational Stress Questionnaire    Feeling of Stress : Not at all  Social Connections: Moderately Isolated (07/16/2022)   Social Connection and Isolation Panel  [NHANES]    Frequency of Communication with Friends and Family: Three times a week    Frequency of  Social Gatherings with Friends and Family: Three times a week    Attends Religious Services: Never    Active Member of Clubs or Organizations: No    Attends Banker Meetings: Never    Marital Status: Married  Catering manager Violence: Not At Risk (01/26/2023)   Humiliation, Afraid, Rape, and Kick questionnaire    Fear of Current or Ex-Partner: No    Emotionally Abused: No    Physically Abused: No    Sexually Abused: No    Review of Systems   Objective:   Vitals:   02/11/23 1120  BP: 110/68  Pulse: 89  Temp: 98 F (36.7 C)  TempSrc: Temporal  SpO2: 92%  Height: 5\' 8"  (1.727 m)     Physical Exam Vitals reviewed.  Constitutional:      General: He is not in acute distress.    Appearance: Normal appearance. He is well-developed. He is not ill-appearing, toxic-appearing or diaphoretic.  HENT:     Head: Normocephalic and atraumatic.  Neck:     Vascular: No carotid bruit or JVD.  Cardiovascular:     Rate and Rhythm: Normal rate and regular rhythm.     Heart sounds: Normal heart sounds. No murmur heard.    Comments: Regular rhythm, no ectopy. Pulmonary:     Effort: Pulmonary effort is normal.     Breath sounds: Normal breath sounds. No rales.  Musculoskeletal:     Right lower leg: No edema.     Left lower leg: No edema.  Skin:    General: Skin is warm and dry.  Neurological:     Mental Status: He is alert and oriented to person, place, and time.  Psychiatric:        Mood and Affect: Mood normal.        Behavior: Behavior normal.     Comments: Appropriate responses, pleasant, euthymic mood.  Affect mood congruent.  Does not appear to be responding to internal stimuli.    50 minutes spent during visit, including chart review, multiple hospital discharge summaries, ER note review, discussion of multiple medical conditions above including memory symptoms,  counseling and assimilation of information, exam, discussion of plan, and chart completion.      Assessment & Plan:  Bradley Oconnor is a 82 y.o. male . Abdominal pain, unspecified abdominal location  -Appears to be improving from his recent hospitalization without recurrence of abdominal pain.  Normal bowel movements, voiding normally, eating and drinking without difficulty and appears to be improving per patient as well as his home Oconnor nurse.  Continue to monitor with ER precautions given, 1 month follow-up.  Hypokalemia - Plan: Comprehensive metabolic panel, Magnesium  -Repeat labs and adjust medicines accordingly.  Memory difficulties - Plan: B12  -Potential component of dementia along with mood changes, depression with loss of spouse, adjustment with multiple hospitalizations and decline in health as above.  History of CVA, multi-infarct dementia also possible, as well as prior COVID infection.  Some sundowning noted per nurse but we will hold on new medications at this time.  RTC precautions given if any significant behavioral disturbances or acute changes in symptoms but plan to discuss further at his follow-up visit in the next 1 month.  Consider neurology evaluation depending on how he is doing next visit.  Check B12 as above.  Continue Remeron for now which appears to be treating depression sufficiently as well as appetite.  Chronic combined systolic and diastolic CHF (congestive heart failure) (HCC)  -Appears euvolemic  at present, and tolerating lower dose torsemide without hypotension.  Continue half dose torsemide, continue to monitor blood pressure and fluid status and consult cardiology if lower blood pressure readings or recurrence of edema.   Repeat echo pending as above.  Atrial fibrillation, unspecified type (HCC)  -Sinus rhythm currently, tolerating Eliquis, blood pressure improved on lower dose torsemide.  No other changes at this time.  No orders of the defined types  were placed in this encounter.  Patient Instructions  Randie Heinz seeing you today.  I am sorry you have gone through so much in the past few months but you are doing great at this time and recovering well!  Half dose of torsemide seems to be effective at this time, watch her blood pressure and if any further low readings or any increased swelling contact your cardiologist to discuss further changes.  As we discussed you may have different possible causes for the memory changes or possible dementia.  I would like to discuss that further at your next visit but I will check some labs today.  We may have you meet with neurology, but again can discuss that at follow-up.  If any new or worsening symptoms prior to your visit with me in 1 month please let me know.  Take Oconnor!    Signed,   Meredith Staggers, MD [facility], [facility] 02/11/23 12:19 PM

## 2023-02-11 NOTE — Telephone Encounter (Signed)
Mild hypokalemia noted. Magnesium low at 1.0.  Will start slow mag supplement BID for 2 days, I additional dose of potassium tonight, then repeat BMP, magnesium in 3 days with lab visit. Advised to be seen in ER if any new symptoms or symptoms of hypomagnesemia.  Plan discussed with patient on phone as well as his nurse [name] at his request.  Understanding of plan expressed.  Please schedule lab visit on Monday.  Labs have been ordered.

## 2023-02-11 NOTE — Patient Instructions (Signed)
Great seeing you today.  I am sorry you have gone through so much in the past few months but you are doing great at this time and recovering well!  Half dose of torsemide seems to be effective at this time, watch her blood pressure and if any further low readings or any increased swelling contact your cardiologist to discuss further changes.  As we discussed you may have different possible causes for the memory changes or possible dementia.  I would like to discuss that further at your next visit but I will check some labs today.  We may have you meet with neurology, but again can discuss that at follow-up.  If any new or worsening symptoms prior to your visit with me in 1 month please let me know.  Take care!

## 2023-02-11 NOTE — Telephone Encounter (Signed)
Lab called with critical lab for this patient. Magnesium 1.0. PCP was made aware via team and phone note.

## 2023-02-12 ENCOUNTER — Other Ambulatory Visit: Payer: Self-pay

## 2023-02-12 ENCOUNTER — Telehealth: Payer: Self-pay | Admitting: Family Medicine

## 2023-02-12 MED ORDER — POTASSIUM CHLORIDE CRYS ER 20 MEQ PO TBCR: 20.0000 meq | EXTENDED_RELEASE_TABLET | Freq: Every day | ORAL | 0 refills | Status: DC

## 2023-02-12 NOTE — Telephone Encounter (Signed)
Potassium 20 MEQ had run out, prescription has been renewed clarified with both brother and caregiver

## 2023-02-12 NOTE — Telephone Encounter (Signed)
Caller name: KIRKE CAPTAIN and caregiver  On DPR?: Yes  Call back number: [phone]  Provider they see: [name], MD  Reason for call:   Are calling about dosing of Magnesium stating pharmacist said 99mg  wasn't enough so they stopped giving it to him and have questions about the dose. He has appt 02/15/2023.

## 2023-02-12 NOTE — Telephone Encounter (Signed)
Pt and Caregiver contacted this morning and lab appt made for Monday  May have an issue with Magnesium as the pharmacy stated that they would not have it for him today they are going to the pharmacy and then they will be contacting us if the med needs to be sent elsewhere

## 2023-02-15 ENCOUNTER — Other Ambulatory Visit (INDEPENDENT_AMBULATORY_CARE_PROVIDER_SITE_OTHER): Payer: PPO

## 2023-02-15 DIAGNOSIS — E876 Hypokalemia: Secondary | ICD-10-CM

## 2023-02-16 ENCOUNTER — Other Ambulatory Visit: Payer: Self-pay

## 2023-02-16 ENCOUNTER — Emergency Department (HOSPITAL_COMMUNITY)
Admission: EM | Admit: 2023-02-16 | Discharge: 2023-02-16 | Disposition: A | Discharge status: Home / Self Care | Payer: PPO | Attending: Emergency Medicine | Admitting: Emergency Medicine

## 2023-02-16 ENCOUNTER — Encounter (HOSPITAL_COMMUNITY): Payer: Self-pay

## 2023-02-16 ENCOUNTER — Telehealth: Payer: Self-pay

## 2023-02-16 DIAGNOSIS — I110 Hypertensive heart disease with heart failure: Secondary | ICD-10-CM | POA: Insufficient documentation

## 2023-02-16 DIAGNOSIS — I2510 Atherosclerotic heart disease of native coronary artery without angina pectoris: Secondary | ICD-10-CM | POA: Diagnosis not present

## 2023-02-16 DIAGNOSIS — Z7901 Long term (current) use of anticoagulants: Secondary | ICD-10-CM | POA: Insufficient documentation

## 2023-02-16 DIAGNOSIS — E039 Hypothyroidism, unspecified: Secondary | ICD-10-CM | POA: Insufficient documentation

## 2023-02-16 DIAGNOSIS — Z951 Presence of aortocoronary bypass graft: Secondary | ICD-10-CM | POA: Diagnosis not present

## 2023-02-16 DIAGNOSIS — I10 Essential (primary) hypertension: Secondary | ICD-10-CM | POA: Diagnosis not present

## 2023-02-16 DIAGNOSIS — I509 Heart failure, unspecified: Secondary | ICD-10-CM | POA: Diagnosis not present

## 2023-02-16 LAB — COMPREHENSIVE METABOLIC PANEL
ALT: 13 U/L (ref 0–44)
AST: 22 U/L (ref 15–41)
Albumin: 2.7 g/dL — ABNORMAL LOW (ref 3.5–5.0)
Alkaline Phosphatase: 60 U/L (ref 38–126)
Anion gap: 13 (ref 5–15)
BUN: 13 mg/dL (ref 8–23)
CO2: 20 mmol/L — ABNORMAL LOW (ref 22–32)
Calcium: 7.6 mg/dL — ABNORMAL LOW (ref 8.9–10.3)
Chloride: 105 mmol/L (ref 98–111)
Creatinine, Ser: 1.05 mg/dL (ref 0.61–1.24)
GFR, Estimated: >60 mL/min (ref >60)
Glucose, Bld: 101 mg/dL — ABNORMAL HIGH (ref 70–99)
Potassium: 3.4 mmol/L — ABNORMAL LOW (ref 3.5–5.1)
Sodium: 138 mmol/L (ref 135–145)
Total Bilirubin: 0.7 mg/dL (ref <1.2)
Total Protein: 6.1 g/dL — ABNORMAL LOW (ref 6.5–8.1)

## 2023-02-16 LAB — BASIC METABOLIC PANEL
BUN: 13 mg/dL (ref 6–23)
CO2: 26 meq/L (ref 19–32)
Calcium: 8 mg/dL — ABNORMAL LOW (ref 8.4–10.5)
Chloride: 104 meq/L (ref 96–112)
Creatinine, Ser: 0.94 mg/dL (ref 0.40–1.50)
GFR: 74.96 mL/min (ref >60.00)
Glucose, Bld: 138 mg/dL — ABNORMAL HIGH (ref 70–99)
Potassium: 3.7 meq/L (ref 3.5–5.1)
Sodium: 143 meq/L (ref 135–145)

## 2023-02-16 LAB — CBC
HCT: 37 % — ABNORMAL LOW (ref 39.0–52.0)
Hemoglobin: 11.9 g/dL — ABNORMAL LOW (ref 13.0–17.0)
MCH: 31 pg (ref 26.0–34.0)
MCHC: 32.2 g/dL (ref 30.0–36.0)
MCV: 96.4 fL (ref 80.0–100.0)
Platelets: 219 10*3/uL (ref 150–400)
RBC: 3.84 MIL/uL — ABNORMAL LOW (ref 4.22–5.81)
RDW: 15 % (ref 11.5–15.5)
WBC: 8.1 10*3/uL (ref 4.0–10.5)
nRBC: 0 % (ref 0.0–0.2)

## 2023-02-16 LAB — MAGNESIUM
Magnesium: 0.8 mg/dL — CL (ref 1.5–2.5)
Magnesium: 0.7 mg/dL — CL (ref 1.7–2.4)

## 2023-02-16 MED ORDER — MAGNESIUM SULFATE 2 GM/50ML IV SOLN: 2.0000 g | Freq: Once | INTRAVENOUS | Status: DC

## 2023-02-16 MED ORDER — SLOW-MAG 71.5-119 MG PO TBEC: 1.0000 | DELAYED_RELEASE_TABLET | Freq: Two times a day (BID) | ORAL | 0 refills | Status: DC

## 2023-02-16 MED ORDER — POTASSIUM CHLORIDE CRYS ER 20 MEQ PO TBCR
40.0000 meq | EXTENDED_RELEASE_TABLET | Freq: Once | ORAL | Status: AC
  Administered 2023-02-16: 40 meq via ORAL
  Filled 2023-02-16: qty 2

## 2023-02-16 MED ORDER — SLOW-MAG 71.5-119 MG PO TBEC: 2.0000 | DELAYED_RELEASE_TABLET | Freq: Two times a day (BID) | ORAL | 0 refills | Status: AC

## 2023-02-16 MED ORDER — MAGNESIUM SULFATE 4 GM/100ML IV SOLN
4.0000 g | Freq: Once | INTRAVENOUS | Status: AC
  Administered 2023-02-16: 4 g via INTRAVENOUS
  Filled 2023-02-16: qty 100

## 2023-02-16 NOTE — ED Triage Notes (Signed)
Pt to ED POV from home. Pt states his PCP called him this morning and told him to come to ER for low magnesium of 0.8. pt denies any pain. Pt denies N/V. Pt denies palpations.

## 2023-02-16 NOTE — ED Notes (Signed)
Pt in NAD at d/c from ED. A&O. Respirations even & unlabored. Skin warm & dry. Pt & pts caregiver [name] verbalized understanding of d/c teaching including follow up care, medications and reasons to return to the ED. No needs or questions expressed at d/c.

## 2023-02-16 NOTE — ED Provider Notes (Signed)
[place] EMERGENCY DEPARTMENT AT [facility] Provider Note   CSN: [id number] Arrival date & time: 02/16/23  1214     History {Add pertinent medical, surgical, social history, OB history to HPI:1} Chief Complaint  Patient presents with   Abnormal Labs    Bradley Oconnor is a 82 y.o. male.     82 year old male with history of hypertension, hyperlipidemia, hypothyroidism, paroxysmal A-fib, atrial fibrillation, GERD, coronary artery disease status post CABG, chronic systolic CHF, mitral regurgitation   Pt to ED POV from home. Pt states his PCP called him this morning and told  him to come to ER for low magnesium of 0.8. pt denies any pain. Pt denies  N/V. Pt denies palpations.   Past Medical History: No date: Blood transfusion without reported diagnosis No date: CAD (coronary artery disease) No date: GERD (gastroesophageal reflux disease) No date: Heart attack (HCC) No date: Hyperlipidemia 03/07/2012: Hypertension     Comment:  ECHO-WNL     08/12/11 Lexiscan MyoviewNo significant               ischemia demonstrated Low risk scan There is a moderate               sized dense scar in the LCX territoy unchanged from the               prior study.. Post- stress EF is 40%. 12/10/2022: Septic shock (HCC) No date: Thyroid disease       Home Medications Prior to Admission medications   Medication Sig Start Date End Date Taking? Authorizing Provider  acetaminophen (TYLENOL) 325 MG tablet Take 650 mg by mouth every 6 (six) hours as needed.    [provider]  apixaban (ELIQUIS) 5 MG TABS tablet Take 1 tablet (5 mg total) by mouth 2 (two) times daily. 01/19/23   [name], MD  cyanocobalamin (VITAMIN B12) 1000 MCG tablet Take 1 tablet (1,000 mcg total) by mouth daily. 01/19/23   [name], MD  escitalopram (LEXAPRO) 10 MG tablet Take 1 tablet by mouth daily.    [provider]  folic acid (FOLVITE) 1 MG tablet Take 1 tablet (1 mg  total) by mouth daily. 12/22/22   Elgergawy, Leana Roe, MD  magnesium chloride (SLOW-MAG) 64 MG TBEC SR tablet Take 1 tablet (64 mg total) by mouth 2 (two) times daily. 02/11/23   [name], MD  Melatonin 10 MG TABS Take 10 mg by mouth at bedtime as needed.    [provider]  mirtazapine (REMERON SOL-TAB) 15 MG disintegrating tablet Take 1 tablet (15 mg total) by mouth at bedtime. 01/19/23   [name], MD  oxybutynin (DITROPAN) 5 MG tablet Take 1 tablet (5 mg total) by mouth every 8 (eight) hours as needed for bladder spasms. 01/30/23   Burnadette Pop, MD  pantoprazole (PROTONIX) 40 MG tablet Take 1 tablet (40 mg total) by mouth daily. 01/30/23   Burnadette Pop, MD  potassium chloride SA (KLOR-CON M) 20 MEQ tablet Take 1 tablet (20 mEq total) by mouth daily. 02/12/23   [name], MD  psyllium (METAMUCIL) 58.6 % packet Take 1 packet by mouth daily.    [provider]  senna (SENOKOT) 8.6 MG TABS tablet Take 1 tablet (8.6 mg total) by mouth daily. 01/30/23   Burnadette Pop, MD  torsemide (DEMADEX) 20 MG tablet Take 1 tablet (20 mg total) by mouth daily. 01/19/23   [name], MD  Allergies    Procardia [nifedipine] and Phenergan [promethazine hcl]    Review of Systems   Review of Systems  Physical Exam Updated Vital Signs BP (!) 99/55 (BP Location: Right Arm)   Pulse 73   Temp 97.6 F (36.4 C) (Oral)   Resp 16   Ht 5\' 9"  (1.753 m)   Wt 62.6 kg   SpO2 (!) 89%   BMI 20.38 kg/m  Physical Exam  ED Results / Procedures / Treatments   Labs (all labs ordered are listed, but only abnormal results are displayed) Labs Reviewed  CBC - Abnormal; Notable for the following components:      Result Value   RBC 3.84 (*)    Hemoglobin 11.9 (*)    HCT 37.0 (*)    All other components within normal limits  COMPREHENSIVE METABOLIC PANEL - Abnormal; Notable for the following components:   Potassium 3.4 (*)    CO2 20 (*)    Glucose, Bld 101 (*)     Calcium 7.6 (*)    Total Protein 6.1 (*)    Albumin 2.7 (*)    All other components within normal limits  MAGNESIUM - Abnormal; Notable for the following components:   Magnesium 0.7 (*)    All other components within normal limits    EKG EKG Interpretation Date/Time:  Tuesday February 16 2023 14:31:04 EST Ventricular Rate:  89 PR Interval:  150 QRS Duration:  108 QT Interval:  368 QTC Calculation: 447 R Axis:   -18  Text Interpretation: Normal sinus rhythm with sinus arrhythmia Moderate voltage criteria for LVH, may be normal variant ( R in aVL , Cornell product ) Inferior infarct , age undetermined Anterolateral infarct , age undetermined Abnormal ECG Confirmed by Vonita Moss [phone]) on 02/16/2023 2:55:05 PM  Radiology No results found.  Procedures Procedures  {Document cardiac monitor, telemetry assessment procedure when appropriate:1}  Medications Ordered in ED Medications  magnesium sulfate IVPB 4 g 100 mL (has no administration in time range)    ED Course/ Medical Decision Making/ A&P   {   Click here for ABCD2, HEART and other calculatorsREFRESH Note before signing :1}                              Medical Decision Making Risk Prescription drug management.   ***  {Document critical care time when appropriate:1} {Document review of labs and clinical decision tools ie heart score, Chads2Vasc2 etc:1}  {Document your independent review of radiology images, and any outside records:1} {Document your discussion with family members, caretakers, and with consultants:1} {Document social determinants of health affecting pt's care:1} {Document your decision making why or why not admission, treatments were needed:1} Final Clinical Impression(s) / ED Diagnoses Final diagnoses:  None    Rx / DC Orders ED Discharge Orders     None

## 2023-02-16 NOTE — Discharge Instructions (Signed)
You were seen for your low magnesium in the emergency department.   At home, please take the increased magnesium dose we prescribed you for the next 4 days.   Check your MyChart online for the results of any tests that had not resulted by the time you left the emergency department.   Follow-up with your primary doctor in 2-3 days regarding your visit.    Return immediately to the emergency department if you experience any of the following: Severe muscle cramps, palpitation, or any other concerning symptoms.    Thank you for visiting our Emergency Department. It was a pleasure taking care of you today.

## 2023-02-16 NOTE — Telephone Encounter (Signed)
Noted. Called patient.  Denies any palpitations or other symptoms of arrhythmias.  No tremor/seizure activity.  Overall feels okay.  Just finished physical therapy.  Did note that his legs felt a little bit weak, arms felt little bit weak with physical therapy but not sure if this is just from deconditioning.  He did take the magnesium supplement as prescribed after last test on the 14th.  Discussed lower magnesium level at this time, and likely will need repletion through IV.  Recommended ER evaluation, repeat testing likely there, potentially may need EKG given this reading, but will defer to ER evaluation.  Hopefully will have IV magnesium repletion, then oral supplementation with recheck outpatient.  Discussed with patient and his nurse on the phone at his request.  All questions answered with understanding of plan expressed.

## 2023-02-16 NOTE — Telephone Encounter (Signed)
Sent to PCP and Dr.Tabori

## 2023-02-16 NOTE — ED Provider Triage Note (Signed)
Emergency Medicine Provider Triage Evaluation Note  Bradley Oconnor , a 82 y.o. male  was evaluated in triage.  Pt complains of critically low magnesium noted by Doctor yesterday, 0.8, normal potassium at that time.  He denies any chest pain, shortness of breath, feeling of heart racing or fluttering.  Review of Systems  Positive: Low mag Negative: Chest pain, palpitations  Physical Exam  BP 93/64 (BP Location: Right Arm)   Pulse 86   Temp 97.6 F (36.4 C)   Resp 16   SpO2 100%  Gen:   Awake, no distress   Resp:  Normal effort  MSK:   Moves extremities without difficulty  Other:    Medical Decision Making  Medically screening exam initiated at 2:14 PM.  Appropriate orders placed.  Bradley Oconnor was informed that the remainder of the evaluation will be completed by another provider, this initial triage assessment does not replace that evaluation, and the importance of remaining in the ED until their evaluation is complete.  Workup initiated in triage    Bradley Oconnor, [place] 02/16/23 1417

## 2023-02-19 ENCOUNTER — Ambulatory Visit (HOSPITAL_COMMUNITY): Payer: PPO | Attending: Nurse Practitioner

## 2023-02-19 DIAGNOSIS — I5022 Chronic systolic (congestive) heart failure: Secondary | ICD-10-CM | POA: Diagnosis not present

## 2023-02-19 LAB — ECHOCARDIOGRAM COMPLETE
Area-P 1/2: 3.12 cm2
P 1/2 time: 614 ms
S' Lateral: 4 cm

## 2023-02-22 ENCOUNTER — Telehealth: Payer: Self-pay | Admitting: Family Medicine

## 2023-02-22 NOTE — Telephone Encounter (Signed)
Discussed with caregiver and did get him set up for lab visit tomorrow

## 2023-02-22 NOTE — Telephone Encounter (Signed)
Noted, thanks!

## 2023-02-22 NOTE — Telephone Encounter (Signed)
Please call patient.  He was seen at the emergency room last week with low magnesium and it appears that he replaced that in the ER but not sure if they sent him home on a supplement.  If possible, would like him to have a lab visit either today or tomorrow for recheck magnesium.  Order entered.  That can be here or walk-in to [facility].  Thanks.

## 2023-02-23 ENCOUNTER — Other Ambulatory Visit: Payer: PPO

## 2023-02-24 ENCOUNTER — Telehealth: Payer: Self-pay | Admitting: Family Medicine

## 2023-02-24 ENCOUNTER — Other Ambulatory Visit: Payer: Self-pay

## 2023-02-24 ENCOUNTER — Other Ambulatory Visit (INDEPENDENT_AMBULATORY_CARE_PROVIDER_SITE_OTHER): Payer: PPO

## 2023-02-24 LAB — MAGNESIUM: Magnesium: 1.4 mg/dL — ABNORMAL LOW (ref 1.5–2.5)

## 2023-02-24 NOTE — Telephone Encounter (Signed)
Encourage patient to contact the pharmacy for refills or they can request refills through [facility]  (Please schedule appointment if patient has not been seen in over a year)    WHAT PHARMACY WOULD THEY LIKE THIS SENT TO: [facility] - [place], [place] - [address]   MEDICATION NAME & DOSE: mirtazapine (REMERON SOL-TAB) 15 MG disintegrating tablet  apixaban (ELIQUIS) 5 MG TABS tablet  pantoprazole (PROTONIX) 40 MG tablet  folic acid (FOLVITE) 1 MG tablet   NOTES/COMMENTS FROM PATIENT:      Front office please notify patient: It takes 48-72 hours to process rx refill requests Ask patient to call pharmacy to ensure rx is ready before heading there.

## 2023-02-26 ENCOUNTER — Other Ambulatory Visit (HOSPITAL_COMMUNITY): Payer: Self-pay

## 2023-02-26 ENCOUNTER — Telehealth: Payer: Self-pay | Admitting: Family Medicine

## 2023-03-01 MED ORDER — MIRTAZAPINE 15 MG PO TBDP: 15.0000 mg | ORAL_TABLET | Freq: Every day | ORAL | 0 refills | Status: DC

## 2023-03-01 MED ORDER — APIXABAN 5 MG PO TABS: 5.0000 mg | ORAL_TABLET | Freq: Two times a day (BID) | ORAL | 0 refills | Status: DC

## 2023-03-01 MED ORDER — PANTOPRAZOLE SODIUM 40 MG PO TBEC: 40.0000 mg | DELAYED_RELEASE_TABLET | Freq: Every day | ORAL | 0 refills | Status: DC

## 2023-03-01 MED ORDER — FOLIC ACID 1 MG PO TABS: 1.0000 mg | ORAL_TABLET | Freq: Every day | ORAL | Status: AC

## 2023-03-01 NOTE — Telephone Encounter (Signed)
Pt has been notified these prescriptions were sent in to the requested pharmacy

## 2023-03-08 ENCOUNTER — Telehealth: Payer: Self-pay

## 2023-03-08 NOTE — Telephone Encounter (Signed)
Spoke with pts brother. He was notified of pts echocardiogram results. Pt will follow up with Dr. Tresa Endo as planned on 04/09/23 and continue current medication.

## 2023-03-12 ENCOUNTER — Other Ambulatory Visit: Payer: Self-pay

## 2023-03-12 ENCOUNTER — Other Ambulatory Visit (INDEPENDENT_AMBULATORY_CARE_PROVIDER_SITE_OTHER): Payer: PPO

## 2023-03-12 LAB — MAGNESIUM: Magnesium: 1.2 mg/dL — ABNORMAL LOW (ref 1.5–2.5)

## 2023-03-16 ENCOUNTER — Telehealth: Payer: Self-pay

## 2023-03-16 NOTE — Telephone Encounter (Signed)
Unfortunately magnesium is slightly low again.  What dose is he taking at home?  Will need to increase it, but want to verify his current dose so I can make changes.  Thanks.

## 2023-03-17 NOTE — Telephone Encounter (Signed)
Called again no answer, LM to return call

## 2023-03-18 ENCOUNTER — Telehealth: Payer: Self-pay

## 2023-03-18 ENCOUNTER — Other Ambulatory Visit: Payer: Self-pay

## 2023-03-18 ENCOUNTER — Ambulatory Visit: Payer: PPO | Admitting: Physician Assistant

## 2023-03-18 MED ORDER — VITAMIN B-12 1000 MCG PO TABS: 1000.0000 ug | ORAL_TABLET | Freq: Every day | ORAL | 0 refills | Status: DC

## 2023-03-18 MED ORDER — MAGNESIUM CHLORIDE 64 MG PO TBEC
1.0000 | DELAYED_RELEASE_TABLET | Freq: Every day | ORAL | 1 refills | Status: DC
Start: 2023-03-18 — End: 2023-06-01

## 2023-03-18 NOTE — Telephone Encounter (Signed)
E2C2 called back with pt on the phone but [name] in clinic will call pt back!

## 2023-03-18 NOTE — Telephone Encounter (Signed)
Prior message invertedly closed prior to completion.  Noted. He was given Slow-Mag 128 mg twice daily for 4 days after 4 g of magnesium via IV on November 19. Reading of 1.4 November 27, 1.2 most recently.  Appears to be downtrending. Will send in Slow-Mag once per day for now, please have him return for lab visit on Monday if possible.  If any new symptoms in the meantime, should be seen in the ER.

## 2023-03-18 NOTE — Telephone Encounter (Addendum)
Noted.  Request was to clarify magnesium dosage, see lab results from 12/17 note.  Thanks

## 2023-03-18 NOTE — Addendum Note (Signed)
Addended by: Meredith Staggers R on: 03/18/2023 05:34 PM   Modules accepted: Orders

## 2023-03-18 NOTE — Telephone Encounter (Signed)
Noted  

## 2023-03-18 NOTE — Telephone Encounter (Signed)
Pt is not taking magnesium I reconciled his med list and that is not on there

## 2023-03-18 NOTE — Telephone Encounter (Signed)
Called [name] to get Potassium directions. She reports 1 tablet in am and 1 in pm Potassium currently please advise any changes.

## 2023-03-18 NOTE — Telephone Encounter (Signed)
Called and was able to talk with brother and delivered the information, he does not know patients dose of meagnesium and will have [name] call the office with information

## 2023-03-18 NOTE — Telephone Encounter (Signed)
Discussed with [name] and gotten medication reconcilled

## 2023-03-18 NOTE — Telephone Encounter (Signed)
Copied from CRM [id number]. Topic: Clinical - Medication Question >> Mar 18, 2023 10:03 AM Tiffany H wrote: Reason for CRM: Patient's caregiver [name] called to speak with McKenzie. Unable to connect with CAL. Please call patient back for follow up ASAP. [name] disconnected without providing a number.

## 2023-03-19 NOTE — Telephone Encounter (Signed)
[name] has been informed and lab appt made

## 2023-03-22 ENCOUNTER — Other Ambulatory Visit (INDEPENDENT_AMBULATORY_CARE_PROVIDER_SITE_OTHER): Payer: PPO

## 2023-03-22 ENCOUNTER — Encounter: Payer: Self-pay | Admitting: Cardiovascular Disease

## 2023-03-22 ENCOUNTER — Telehealth: Payer: Self-pay

## 2023-03-22 LAB — BASIC METABOLIC PANEL
BUN: 19 mg/dL (ref 6–23)
CO2: 28 meq/L (ref 19–32)
Calcium: 8.9 mg/dL (ref 8.4–10.5)
Chloride: 101 meq/L (ref 96–112)
Creatinine, Ser: 1.12 mg/dL (ref 0.40–1.50)
GFR: 60.71 mL/min (ref >60.00)
Glucose, Bld: 108 mg/dL — ABNORMAL HIGH (ref 70–99)
Potassium: 4.8 meq/L (ref 3.5–5.1)
Sodium: 140 meq/L (ref 135–145)

## 2023-03-22 LAB — MAGNESIUM: Magnesium: 1.2 mg/dL — ABNORMAL LOW (ref 1.5–2.5)

## 2023-03-22 NOTE — Telephone Encounter (Signed)
Saw patient and caregiver in office this morning, did not see message previous to the lab visit, patient has an appointment Friday as well.

## 2023-03-22 NOTE — Telephone Encounter (Signed)
Copied from CRM [id number]. Topic: Clinical - Prescription Issue >> Mar 22, 2023  9:29 AM Sim Boast F wrote: Reason for CRM: [name] request a call back from [facility] regarding pts medication that was prescribe Saturday for his Magnesium - would like a call back before his lab appt at 11am today.

## 2023-03-25 NOTE — Telephone Encounter (Signed)
Called Bradley Oconnor. Prescribed same dose that he had been taking?  My understanding is that he had not been on it previously.  He does have an appointment with me tomorrow.  Advised to bring all his meds and we will sort out what dose he has been taking, adjust dose at that time and repeat labs.

## 2023-03-25 NOTE — Telephone Encounter (Signed)
Copied from CRM [id number]. Topic: Clinical - Medication Question >> Mar 23, 2023  1:40 PM Isabell A wrote: Reason for CRM: Alexia (OT)  from [facility] agency calling in regards to questions about patients dosage of magnesium chloride (SLOW-MAG) 64 MG TBEC SR tablet   Callback Number: [phone]

## 2023-03-25 NOTE — Telephone Encounter (Signed)
Per CRM Pt is requesting a call back - pt has a visit scheduled for tomorrow Friday- advise

## 2023-03-25 NOTE — Telephone Encounter (Signed)
-----   Message from [name] sent at 03/23/2023  3:56 PM EST ----- Magnesium still low but stable.  How long has he been taking the supplement prior to this reading?  Electrolytes including potassium are overall stable.

## 2023-03-26 ENCOUNTER — Ambulatory Visit (INDEPENDENT_AMBULATORY_CARE_PROVIDER_SITE_OTHER): Payer: PPO | Admitting: Family Medicine

## 2023-03-26 DIAGNOSIS — F4320 Adjustment disorder, unspecified: Secondary | ICD-10-CM

## 2023-03-26 DIAGNOSIS — I5042 Chronic combined systolic (congestive) and diastolic (congestive) heart failure: Secondary | ICD-10-CM

## 2023-03-26 DIAGNOSIS — Z8639 Personal history of other endocrine, nutritional and metabolic disease: Secondary | ICD-10-CM

## 2023-03-26 DIAGNOSIS — R413 Other amnesia: Secondary | ICD-10-CM

## 2023-03-26 MED ORDER — APIXABAN 5 MG PO TABS: 5.0000 mg | ORAL_TABLET | Freq: Two times a day (BID) | ORAL | 0 refills | Status: DC

## 2023-03-26 MED ORDER — MIRTAZAPINE 15 MG PO TBDP: 15.0000 mg | ORAL_TABLET | Freq: Every day | ORAL | 3 refills | Status: DC

## 2023-03-26 MED ORDER — PANTOPRAZOLE SODIUM 40 MG PO TBEC: 40.0000 mg | DELAYED_RELEASE_TABLET | Freq: Every day | ORAL | 0 refills | Status: DC

## 2023-03-26 NOTE — Progress Notes (Unsigned)
Subjective:  Patient ID: Bradley Oconnor, male    DOB: 04-15-1940  Age: 82 y.o. MRN: [id number]  CC:  Chief Complaint  Patient presents with   Medical Management of Chronic Issues    Caregiver Bradley Oconnor and brother Bradley Oconnor present today with the patient, note they do not have questions today other than what is going on with magnesium level     HPI Bradley Oconnor presents for follow up.  Here with his brother Bradley Oconnor and caregiver Bradley Oconnor today.  Has been trying to walk to mailbox and back. Getting stronger. Able to walk to commode and back with RW. No falls.  No new chest pain or swelling. Eating better. Some weight gain with increased eating, but new swelling.   Hypomagnesemia Last visit with me November 14 for hospital follow-up.  Had been low in the 1.2-1.3 range during hospitalization, improved to 2.1 then at his November 14 visit was low at 1.0.  Treated with Slow-Mag 64 mg twice daily for 2 days with follow-up lab on November 18 unfortunately lower at 0.8.  ER evaluation recommended that time as likely IV treatment needed. He was seen November 19 through [facility] health ER. Marland Kitchen  He was given 4 g of magnesium via IV as well as oral potassium since his potassium level is 3.4.  He was asymptomatic, discharged home with outpatient follow-up.  Treated with Slow-Mag 128 mg twice daily for 4 days. Repeat level of 1.4 November 27, recommended to continue with over-the-counter magnesium supplementation with close monitoring. Level of 1.2 on December 13 -we tried to verify his home dosing at that time. Same level of 1.2 in December 23.  Some confusion regarding his home dosing.  Has been continued on Slow-Mag 64 mg.  Currently on slowmag 143mg  - had been taking BID recently until cal last week - now on once per day (we were under the impression he had not been taking magnesium on last labs).  No new fatigue, muscle cramps, palpitations or new tremors. Has had some residual R hand tremor.  Memory  difficulties Discussed at his hospital follow-up November 14.  Possible component of dementia along with mood changes and depression with loss of spouse, and adjustment disorder with multiple hospitalizations and decline in health.  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  Held on new medications at that time, B12 supplementation continued as well as Remeron which appeared to be treating his depression sufficiently as well as appetite at his last visit.  No recent MoCA/MMSE. Taking remeron during the day now. No difficulty sleeping.  Off Lexapro due to prior QT prolongation. Overall happy. Rare episodes of aggravation - few flairs.  Brother and Bradley Oconnor feel like memory has been improving overall. Still some repeating, some memory issues at times.   History of DVT/PE, atrial fibrillation, tolerating Eliquis without any bleeding, no new chest pain, dyspnea or leg swelling.     02/11/2023   11:20 AM 07/16/2022    4:17 PM 08/11/2021   11:41 AM 08/07/2021   12:56 PM 03/03/2021   11:07 AM  Depression screen PHQ 2/9  Decreased Interest 0 0 1 0 0  Down, Depressed, Hopeless 0 0 0 0 0  PHQ - 2 Score 0 0 1 0 0  Altered sleeping 0 0 0    Tired, decreased energy 0 0 1    Change in appetite 0 0 1    Feeling bad or failure about  yourself  0 0 0    Trouble concentrating 0 0 0    Moving slowly or fidgety/restless 0 0 0    Suicidal thoughts 0 0 0    PHQ-9 Score 0 0 3    Difficult doing work/chores  Not difficult at all        Lab Results  Component Value Date   VITAMINB12 1,275 (H) 02/11/2023      07/16/2022    4:14 PM 08/07/2021   12:51 PM 02/16/2017    2:07 PM  6CIT Screen  What Year? 0 points 0 points 0 points  What month? 0 points 0 points 0 points  What time? 0 points 0 points 0 points  Count back from 20 0 points 0 points 0 points  Months in reverse 2 points 0 points 0 points  Repeat phrase 0 points 0 points 0 points  Total  Score 2 points 0 points 0 points           History Patient Active Problem List   Diagnosis Date Noted   Cholelithiasis 01/26/2023   Intractable lower abdominal pain 01/26/2023   Prolonged QT interval 12/21/2022   Malnutrition of moderate degree 12/19/2022   Persistent atrial fibrillation (HCC) 12/15/2022   Chronic combined systolic and diastolic CHF (congestive heart failure) (HCC) 12/15/2022   Pressure injury of skin 12/15/2022   Low back pain 08/07/2020   Trigger finger of right thumb 04/17/2020   Carpal tunnel syndrome, bilateral 09/26/2019   Shoulder pain, bilateral 08/03/2019   Cervicalgia 03/28/2018   GERD (gastroesophageal reflux disease) 04/10/2013   HTN (hypertension) 04/10/2013   CAD (coronary artery disease) of artery bypass graft 10/04/2012   Hypothyroidism 10/04/2012   Hyperlipidemia with target LDL less than 70 10/04/2012   Kidney stones 01/27/2012   Hx of appendectomy-history of ruptured requiring ileocecectomy (~1994) 01/27/2012   Past Medical History:  Diagnosis Date   Blood transfusion without reported diagnosis    CAD (coronary artery disease)    GERD (gastroesophageal reflux disease)    Heart attack (HCC)    Hyperlipidemia    Hypertension 03/07/2012   ECHO-WNL     08/12/11 Lexiscan MyoviewNo significant ischemia demonstrated Low risk scan There is a moderate sized dense scar in the LCX territoy unchanged from the prior study.. Post- stress EF is 40%.   Septic shock (HCC) 12/10/2022   Thyroid disease    Past Surgical History:  Procedure Laterality Date   APPENDECTOMY     CERVICAL SPINE SURGERY     titanium plate in the back of neck   CORONARY ARTERY BYPASS GRAFT     HERNIA REPAIR     SMALL INTESTINE SURGERY     THYROID SURGERY     1/2 thyroid removed on right side   Allergies  Allergen Reactions   Procardia [Nifedipine] Other (See Comments)    Hypotension    Phenergan [Promethazine Hcl] Nausea And Vomiting   Prior to Admission  medications   Medication Sig Start Date End Date Taking? Authorizing Provider  acetaminophen (TYLENOL) 325 MG tablet Take 650 mg by mouth every 6 (six) hours as needed.   Yes [provider]  apixaban (ELIQUIS) 5 MG TABS tablet Take 1 tablet (5 mg total) by mouth 2 (two) times daily. 03/01/23  Yes [name], MD  cyanocobalamin (VITAMIN B12) 1000 MCG tablet Take 1 tablet (1,000 mcg total) by mouth daily. 03/18/23  Yes [name], MD  escitalopram (LEXAPRO) 10 MG tablet Take 1 tablet by mouth  daily.   Yes [provider]  folic acid (FOLVITE) 1 MG tablet Take 1 tablet (1 mg total) by mouth daily. 03/01/23  Yes [name], MD  magnesium chloride (SLOW-MAG) 64 MG TBEC SR tablet Take 1 tablet (64 mg total) by mouth daily. 03/18/23  Yes [name], MD  mirtazapine (REMERON SOL-TAB) 15 MG disintegrating tablet Take 1 tablet (15 mg total) by mouth at bedtime. 03/01/23  Yes [name], MD  oxybutynin (DITROPAN) 5 MG tablet Take 1 tablet (5 mg total) by mouth every 8 (eight) hours as needed for bladder spasms. 01/30/23  Yes Burnadette Pop, MD  pantoprazole (PROTONIX) 40 MG tablet Take 1 tablet (40 mg total) by mouth daily. 03/01/23  Yes [name], MD  potassium chloride SA (KLOR-CON M) 20 MEQ tablet Take 1 tablet (20 mEq total) by mouth daily. 02/12/23  Yes [name], MD  psyllium (METAMUCIL) 58.6 % packet Take 1 packet by mouth daily.   Yes [provider]  senna (SENOKOT) 8.6 MG TABS tablet Take 1 tablet (8.6 mg total) by mouth daily. 01/30/23  Yes Burnadette Pop, MD  torsemide (DEMADEX) 20 MG tablet Take 1 tablet (20 mg total) by mouth daily. 01/19/23  Yes [name], MD   Social History   Socioeconomic History   Marital status: Married    Spouse name: Not on file   Number of children: Not on file   Years of education: 16   Highest education level: Some college, no degree  Occupational History   Occupation: Retired   Tobacco Use   Smoking status: Never   Smokeless tobacco: Never  Vaping Use   Vaping status: Never Used  Substance and Sexual Activity   Alcohol use: No    Alcohol/week: 0.0 standard drinks of alcohol   Drug use: No   Sexual activity: Never  Other Topics Concern   Not on file  Social History Narrative   Married.    Social Drivers of Corporate investment banker Strain: Low Risk  (07/16/2022)   Overall Financial Resource Strain (CARDIA)    Difficulty of Paying Living Expenses: Not hard at all  Food Insecurity: No Food Insecurity (01/26/2023)   Hunger Vital Sign    Worried About Running Out of Food in the Last Year: Never true    Ran Out of Food in the Last Year: Never true  Transportation Needs: No Transportation Needs (01/26/2023)   PRAPARE - Administrator, Civil Service (Medical): No    Lack of Transportation (Non-Medical): No  Physical Activity: Insufficiently Active (07/16/2022)   Exercise Vital Sign    Days of Exercise per Week: 3 days    Minutes of Exercise per Session: 30 min  Stress: No Stress Concern Present (07/16/2022)   [employer] of Occupational Health - Occupational Stress Questionnaire    Feeling of Stress : Not at all  Social Connections: Moderately Isolated (07/16/2022)   Social Connection and Isolation Panel [NHANES]    Frequency of Communication with Friends and Family: Three times a week    Frequency of Social Gatherings with Friends and Family: Three times a week    Attends Religious Services: Never    Active Member of Clubs or Organizations: No    Attends Banker Meetings: Never    Marital Status: Married  Catering manager Violence: Not At Risk (01/26/2023)   Humiliation, Afraid, Rape, and Kick questionnaire    Fear of Current or Ex-Partner: No  Emotionally Abused: No    Physically Abused: No    Sexually Abused: No    Review of Systems Per HPI.   Objective:   Vitals:   03/26/23 1101  BP: 110/66  Pulse: 84   Temp: 98 F (36.7 C)  TempSrc: Temporal  SpO2: 98%     Physical Exam Vitals reviewed.  Constitutional:      Appearance: He is well-developed.  HENT:     Head: Normocephalic and atraumatic.  Neck:     Vascular: No carotid bruit or JVD.  Cardiovascular:     Rate and Rhythm: Normal rate and regular rhythm.     Heart sounds: Normal heart sounds. No murmur heard. Pulmonary:     Effort: Pulmonary effort is normal.     Breath sounds: Normal breath sounds. No rales.  Musculoskeletal:     Right lower leg: No edema.     Left lower leg: No edema.  Skin:    General: Skin is warm and dry.  Neurological:     Mental Status: He is alert and oriented to person, place, and time. Mental status is at baseline.  Psychiatric:        Mood and Affect: Mood normal.        Behavior: Behavior normal.        Thought Content: Thought content normal.       03/26/2023   12:08 PM  Montreal Cognitive Assessment   Visuospatial/ Executive (0/5) 1  Naming (0/3) 2  Attention: Read list of digits (0/2) 0  Attention: Read list of letters (0/1) 1  Attention: Serial 7 subtraction starting at 100 (0/3) 3  Language: Repeat phrase (0/2) 1  Language : Fluency (0/1) 0  Abstraction (0/2) 2  Delayed Recall (0/5) 0  Orientation (0/6) 6  Total 16  Adjusted Score (based on education) 17     *** minutes spent during visit, including chart review, counseling and assimilation of information, exam, discussion of plan, and chart completion.   Assessment & Plan:  SAYEED LIPE is a 82 y.o. male . No diagnosis found.   No orders of the defined types were placed in this encounter.  There are no Patient Instructions on file for this visit.    Signed,   Meredith Staggers, MD [facility], [facility] 03/26/23 11:08 AM

## 2023-03-26 NOTE — Patient Instructions (Addendum)
Go back to twice per day magnesium supplement.  It may be a little bit lower today since you had only been taking the medication once per day since last labs.  I apologize for any confusion regarding the previous dosing, but should be at 2 pills/day for now.  If levels are low today, I will have you increase that slightly.  Depending on the level hopefully we can treat that at home.  I will let you know.  I am glad to hear that the memory symptoms have not improved, but I would like you to meet with a neurologist to see if they recommend further testing or medication.  You should be receiving a call from them soon.  Please let me know if you have questions.  No other medication changes at this time.  Keep follow-up with specialist as planned.  If any new shortness of breath or swelling be seen right away. Return to the clinic or go to the nearest emergency room if any of your symptoms worsen or new symptoms occur.

## 2023-03-27 LAB — BASIC METABOLIC PANEL
BUN: 16 mg/dL (ref 7–25)
CO2: 26 mmol/L (ref 20–32)
Calcium: 9 mg/dL (ref 8.6–10.3)
Chloride: 104 mmol/L (ref 98–110)
Creat: 1.11 mg/dL (ref 0.70–1.22)
Glucose, Bld: 104 mg/dL — ABNORMAL HIGH (ref 65–99)
Potassium: 4.8 mmol/L (ref 3.5–5.3)
Sodium: 141 mmol/L (ref 135–146)

## 2023-03-27 LAB — MAGNESIUM: Magnesium: 1.3 mg/dL — ABNORMAL LOW (ref 1.5–2.5)

## 2023-03-28 ENCOUNTER — Encounter: Payer: Self-pay | Admitting: Family Medicine

## 2023-03-29 ENCOUNTER — Encounter: Payer: Self-pay | Admitting: Physician Assistant

## 2023-03-30 ENCOUNTER — Telehealth: Payer: Self-pay

## 2023-03-30 NOTE — Telephone Encounter (Signed)
-----   Message from Bradley Oconnor [place] sent at 03/27/2023  5:04 PM EST ----- I called patient with results - spoke with Suzen who will relay results.  Will continue twice daily dosing of Slow-Mag, recheck levels in 2 weeks for lab visit.    Please schedule lab visit: magnesium  level, diagnosis of hypomagnesemia

## 2023-04-02 NOTE — Telephone Encounter (Signed)
 Pt Caregiver [name] already made the lab follow up appt

## 2023-04-09 ENCOUNTER — Telehealth: Payer: Self-pay

## 2023-04-09 ENCOUNTER — Other Ambulatory Visit: Payer: Self-pay | Admitting: Family Medicine

## 2023-04-09 ENCOUNTER — Other Ambulatory Visit (INDEPENDENT_AMBULATORY_CARE_PROVIDER_SITE_OTHER): Payer: PPO

## 2023-04-09 ENCOUNTER — Ambulatory Visit: Payer: PPO | Admitting: Cardiovascular Disease

## 2023-04-09 LAB — MAGNESIUM: Magnesium: 1.5 mg/dL (ref 1.5–2.5)

## 2023-04-09 MED ORDER — TORSEMIDE 20 MG PO TABS: 10.0000 mg | ORAL_TABLET | Freq: Every day | ORAL | 0 refills | Status: DC

## 2023-04-09 NOTE — Telephone Encounter (Signed)
 Requested Prescriptions   Pending Prescriptions Disp Refills   torsemide  (DEMADEX ) 20 MG tablet 30 tablet 0    Sig: Take 1 tablet (20 mg total) by mouth daily.     Date of patient request: 04/09/2023 Last office visit: 03/26/2023 Upcoming visit: 05/14/2023 Date of last refill: 01/19/2023 Last refill amount: 30  Pt has been taking half tablet recently, per Caregiver Suzen

## 2023-04-09 NOTE — Telephone Encounter (Signed)
 Noted, prescription refilled, 1/2 to 1/day and keep follow-up as planned with myself as well as cardiology.  Monitor for any increase in swelling if he is taking lower doses or new symptoms.  Be seen if that occurs.

## 2023-04-09 NOTE — Telephone Encounter (Signed)
-----   Message from [name] sent at 04/09/2023 12:43 PM EST ----- Call patient.  Good news, magnesium level is now normal.  Continue same supplement dose.  Let me know if there are questions.

## 2023-04-09 NOTE — Addendum Note (Signed)
 Addended by: Eldred Manges on: 04/09/2023 11:16 AM   Modules accepted: Orders

## 2023-04-09 NOTE — Addendum Note (Signed)
 Addended by: Meredith Staggers R on: 04/09/2023 12:42 PM   Modules accepted: Orders

## 2023-04-09 NOTE — Telephone Encounter (Signed)
 Encourage patient to contact the pharmacy for refills or they can request refills through [facility]  (Please schedule appointment if patient has not been seen in over a year)    WHAT PHARMACY WOULD THEY LIKE THIS SENT TO: [facility] - [place], [place] - [address]   MEDICATION NAME & DOSE: torsemide  (DEMADEX ) 20 MG tablet   NOTES/COMMENTS FROM PATIENT: Pt has only been taking 1/2 tablet recently     Front office please notify patient: It takes 48-72 hours to process rx refill requests Ask patient to call pharmacy to ensure rx is ready before heading there.

## 2023-04-12 NOTE — Telephone Encounter (Signed)
 LM to call back with [name] patient caregiver so we can discuss labs

## 2023-04-12 NOTE — Telephone Encounter (Signed)
 Discussed results with kimberly no further action required stated they would call if it continued or they need anything additional

## 2023-04-12 NOTE — Telephone Encounter (Signed)
 Copied from CRM [id number]. Topic: Clinical - Lab/Test Results >> Apr 12, 2023 11:05 AM Joanette Gula wrote: Patient is returning [place]fin Ganja, [place] phone call regarding lab results.. Please call back  [phone]

## 2023-04-21 ENCOUNTER — Emergency Department (HOSPITAL_COMMUNITY): Payer: PPO

## 2023-04-21 ENCOUNTER — Encounter (HOSPITAL_COMMUNITY): Payer: Self-pay

## 2023-04-21 ENCOUNTER — Emergency Department (HOSPITAL_COMMUNITY)
Admission: EM | Admit: 2023-04-21 | Discharge: 2023-04-21 | Disposition: A | Discharge status: Home / Self Care | Payer: PPO | Attending: Emergency Medicine | Admitting: Emergency Medicine

## 2023-04-21 ENCOUNTER — Other Ambulatory Visit: Payer: Self-pay

## 2023-04-21 ENCOUNTER — Ambulatory Visit: Payer: Self-pay | Admitting: Family Medicine

## 2023-04-21 DIAGNOSIS — I2510 Atherosclerotic heart disease of native coronary artery without angina pectoris: Secondary | ICD-10-CM | POA: Insufficient documentation

## 2023-04-21 DIAGNOSIS — E860 Dehydration: Secondary | ICD-10-CM | POA: Diagnosis not present

## 2023-04-21 DIAGNOSIS — Z7901 Long term (current) use of anticoagulants: Secondary | ICD-10-CM | POA: Diagnosis not present

## 2023-04-21 DIAGNOSIS — R55 Syncope and collapse: Secondary | ICD-10-CM | POA: Diagnosis not present

## 2023-04-21 DIAGNOSIS — I509 Heart failure, unspecified: Secondary | ICD-10-CM | POA: Diagnosis not present

## 2023-04-21 DIAGNOSIS — I4891 Unspecified atrial fibrillation: Secondary | ICD-10-CM | POA: Diagnosis not present

## 2023-04-21 DIAGNOSIS — J9811 Atelectasis: Secondary | ICD-10-CM | POA: Diagnosis not present

## 2023-04-21 DIAGNOSIS — R531 Weakness: Secondary | ICD-10-CM | POA: Diagnosis not present

## 2023-04-21 DIAGNOSIS — Z79899 Other long term (current) drug therapy: Secondary | ICD-10-CM | POA: Insufficient documentation

## 2023-04-21 DIAGNOSIS — I700 Atherosclerosis of aorta: Secondary | ICD-10-CM | POA: Diagnosis not present

## 2023-04-21 LAB — BASIC METABOLIC PANEL
Anion gap: 10 (ref 5–15)
BUN: 23 mg/dL (ref 8–23)
CO2: 24 mmol/L (ref 22–32)
Calcium: 9.2 mg/dL (ref 8.9–10.3)
Chloride: 104 mmol/L (ref 98–111)
Creatinine, Ser: 1.31 mg/dL — ABNORMAL HIGH (ref 0.61–1.24)
GFR, Estimated: 54 mL/min — ABNORMAL LOW (ref >60)
Glucose, Bld: 166 mg/dL — ABNORMAL HIGH (ref 70–99)
Potassium: 4.1 mmol/L (ref 3.5–5.1)
Sodium: 138 mmol/L (ref 135–145)

## 2023-04-21 LAB — CBC WITH DIFFERENTIAL/PLATELET
Abs Immature Granulocytes: 0.05 10*3/uL (ref 0.00–0.07)
Basophils Absolute: 0 10*3/uL (ref 0.0–0.1)
Basophils Relative: 0 %
Eosinophils Absolute: 0.2 10*3/uL (ref 0.0–0.5)
Eosinophils Relative: 3 %
HCT: 38.5 % — ABNORMAL LOW (ref 39.0–52.0)
Hemoglobin: 12.5 g/dL — ABNORMAL LOW (ref 13.0–17.0)
Immature Granulocytes: 1 %
Lymphocytes Relative: 23 %
Lymphs Abs: 1.7 10*3/uL (ref 0.7–4.0)
MCH: 31.1 pg (ref 26.0–34.0)
MCHC: 32.5 g/dL (ref 30.0–36.0)
MCV: 95.8 fL (ref 80.0–100.0)
Monocytes Absolute: 0.5 10*3/uL (ref 0.1–1.0)
Monocytes Relative: 6 %
Neutro Abs: 5 10*3/uL (ref 1.7–7.7)
Neutrophils Relative %: 67 %
Platelets: 228 10*3/uL (ref 150–400)
RBC: 4.02 MIL/uL — ABNORMAL LOW (ref 4.22–5.81)
RDW: 13.8 % (ref 11.5–15.5)
WBC: 7.5 10*3/uL (ref 4.0–10.5)
nRBC: 0 % (ref 0.0–0.2)

## 2023-04-21 LAB — URINALYSIS, ROUTINE W REFLEX MICROSCOPIC
Bilirubin Urine: NEGATIVE
Glucose, UA: NEGATIVE mg/dL
Hgb urine dipstick: NEGATIVE
Ketones, ur: NEGATIVE mg/dL
Leukocytes,Ua: NEGATIVE
Nitrite: NEGATIVE
Protein, ur: NEGATIVE mg/dL
Specific Gravity, Urine: 1.013 (ref 1.005–1.030)
pH: 5 (ref 5.0–8.0)

## 2023-04-21 LAB — TROPONIN I (HIGH SENSITIVITY)
Troponin I (High Sensitivity): 14 ng/L (ref <18)
Troponin I (High Sensitivity): 12 ng/L (ref <18)

## 2023-04-21 MED ORDER — SODIUM CHLORIDE 0.9 % IV BOLUS
1000.0000 mL | Freq: Once | INTRAVENOUS | Status: AC
  Administered 2023-04-21: 1000 mL via INTRAVENOUS

## 2023-04-21 NOTE — ED Provider Notes (Signed)
[place] EMERGENCY DEPARTMENT AT [facility] Provider Note   CSN: [id number] Arrival date & time: 04/21/23  1750     History {Add pertinent medical, surgical, social history, OB history to HPI:1} No chief complaint on file.   Bradley Oconnor is a 83 y.o. male.  He has a history of atrial fibrillation CHF CAD.  He said he was doing some physical therapy at home today when he became acutely weak like he was going to pass out.  Possibly had a brief syncopal event.  EMS found with blood pressure in the 90s no radial pulses.  They gave him some IV fluids and he has been improving during transport.  He said he feels mostly back to baseline now.  He denies any chest pain or shortness of breath nausea vomiting diarrhea or urinary symptoms.  He said the only difference today was that he did not have a big breakfast.  He has been doing some physical therapy due to general weakness after prolonged hospitalization.  The history is provided by the patient and the EMS personnel.  Loss of Consciousness Episode history:  Single Most recent episode:  Today Progression:  Resolved Chronicity:  New Context: normal activity   Witnessed: yes   Relieved by:  Lying down Associated symptoms: weakness   Associated symptoms: no chest pain, no difficulty breathing, no fever, no focal weakness, no nausea, no shortness of breath and no vomiting   Risk factors: coronary artery disease        Home Medications Prior to Admission medications   Medication Sig Start Date End Date Taking? Authorizing Provider  acetaminophen (TYLENOL) 325 MG tablet Take 650 mg by mouth every 6 (six) hours as needed.    [provider]  apixaban (ELIQUIS) 5 MG TABS tablet Take 1 tablet (5 mg total) by mouth 2 (two) times daily. 03/26/23   [name], MD  cyanocobalamin (VITAMIN B12) 1000 MCG tablet Take 1 tablet (1,000 mcg total) by mouth daily. 03/18/23   [name], MD  folic acid (FOLVITE) 1 MG  tablet Take 1 tablet (1 mg total) by mouth daily. 03/01/23   [name], MD  magnesium chloride (SLOW-MAG) 64 MG TBEC SR tablet Take 1 tablet (64 mg total) by mouth daily. 03/18/23   [name], MD  mirtazapine (REMERON SOL-TAB) 15 MG disintegrating tablet Take 1 tablet (15 mg total) by mouth daily. 03/26/23   [name], MD  oxybutynin (DITROPAN) 5 MG tablet Take 1 tablet (5 mg total) by mouth every 8 (eight) hours as needed for bladder spasms. 01/30/23   Burnadette Pop, MD  pantoprazole (PROTONIX) 40 MG tablet Take 1 tablet (40 mg total) by mouth daily. 03/26/23   [name], MD  potassium chloride SA (KLOR-CON M) 20 MEQ tablet Take 1 tablet (20 mEq total) by mouth daily. 02/12/23   [name], MD  psyllium (METAMUCIL) 58.6 % packet Take 1 packet by mouth daily.    [provider]  senna (SENOKOT) 8.6 MG TABS tablet Take 1 tablet (8.6 mg total) by mouth daily. 01/30/23   Burnadette Pop, MD  torsemide (DEMADEX) 20 MG tablet Take 0.5-1 tablets (10-20 mg total) by mouth daily. 04/09/23   [name], MD      Allergies    Procardia [nifedipine] and Phenergan [promethazine hcl]    Review of Systems   Review of Systems  Constitutional:  Positive for fatigue. Negative for fever.  Respiratory:  Negative  for shortness of breath.   Cardiovascular:  Positive for syncope. Negative for chest pain.  Gastrointestinal:  Negative for nausea and vomiting.  Genitourinary:  Negative for dysuria.  Neurological:  Positive for weakness. Negative for focal weakness.    Physical Exam Updated Vital Signs BP 130/73 (BP Location: Right Arm)   Pulse 79   Temp 97.8 F (36.6 C) (Oral)   Resp 18   SpO2 100%  Physical Exam Vitals and nursing note reviewed.  Constitutional:      General: He is not in acute distress.    Appearance: Normal appearance. He is well-developed.  HENT:     Head: Normocephalic and atraumatic.  Eyes:     Conjunctiva/sclera: Conjunctivae  normal.  Cardiovascular:     Rate and Rhythm: Normal rate and regular rhythm.     Heart sounds: No murmur heard. Pulmonary:     Effort: Pulmonary effort is normal. No respiratory distress.     Breath sounds: Normal breath sounds.  Abdominal:     Palpations: Abdomen is soft.     Tenderness: There is no abdominal tenderness. There is no guarding or rebound.  Musculoskeletal:        General: No deformity.     Cervical back: Neck supple.     Right lower leg: No edema.     Left lower leg: No edema.  Skin:    General: Skin is warm and dry.     Capillary Refill: Capillary refill takes less than 2 seconds.  Neurological:     General: No focal deficit present.     Mental Status: He is alert and oriented to person, place, and time.     Cranial Nerves: No cranial nerve deficit.     Sensory: No sensory deficit.     Motor: No weakness.     ED Results / Procedures / Treatments   Labs (all labs ordered are listed, but only abnormal results are displayed) Labs Reviewed  BASIC METABOLIC PANEL  CBC WITH DIFFERENTIAL/PLATELET  URINALYSIS, ROUTINE W REFLEX MICROSCOPIC  TROPONIN I (HIGH SENSITIVITY)    EKG None  Radiology No results found.  Procedures Procedures  {Document cardiac monitor, telemetry assessment procedure when appropriate:1}  Medications Ordered in ED Medications - No data to display  ED Course/ Medical Decision Making/ A&P   {   Click here for ABCD2, HEART and other calculatorsREFRESH Note before signing :1}                              Medical Decision Making Amount and/or Complexity of Data Reviewed Labs: ordered. Radiology: ordered.   This patient complains of ***; this involves an extensive number of treatment Options and is a complaint that carries with it a high risk of complications and morbidity. The differential includes ***  I ordered, reviewed and interpreted labs, which included *** I ordered medication *** and reviewed PMP when indicated. I  ordered imaging studies which included *** and I independently    visualized and interpreted imaging which showed *** Additional history obtained from *** Previous records obtained and reviewed *** I consulted *** and discussed lab and imaging findings and discussed disposition.  Cardiac monitoring reviewed, *** Social determinants considered, *** Critical Interventions: ***  After the interventions stated above, I reevaluated the patient and found *** Admission and further testing considered, ***   {Document critical care time when appropriate:1} {Document review of labs and clinical decision tools ie heart score,  Chads2Vasc2 etc:1}  {Document your independent review of radiology images, and any outside records:1} {Document your discussion with family members, caretakers, and with consultants:1} {Document social determinants of health affecting pt's care:1} {Document your decision making why or why not admission, treatments were needed:1} Final Clinical Impression(s) / ED Diagnoses Final diagnoses:  None    Rx / DC Orders ED Discharge Orders     None

## 2023-04-21 NOTE — Telephone Encounter (Addendum)
This RN attempted third attempt to patient. No answer. LVM. Will route high priority to office.   This RN attempted second call to patient. No answer. LVM. Will do 3rd attempt in 15 minutes.   Copied from CRM [id number]. Topic: Clinical - Red Word Triage >> Apr 21, 2023  4:45 PM Aletta Edouard wrote: Red Word that prompted transfer to Nurse Triage: patient blood pressure   is low 80/58  Call dropped before transfer from agent was complete. Attempted to call patient but no answer. Agent reports that it was a home health nurse who called and that she may have hung up to call the patient an ambulance. Routing for call back.

## 2023-04-21 NOTE — Discharge Instructions (Signed)
Please stay well-hydrated.  Follow-up with your regular doctor.  Return to the emergency department if any worsening or concerning symptoms.

## 2023-04-21 NOTE — Telephone Encounter (Signed)
Copied from CRM [id number]. Topic: Clinical - Red Word Triage >> Apr 21, 2023  4:45 PM Aletta Edouard wrote: Red Word that prompted transfer to Nurse Triage: patient blood pressure   is low 80/58  Call dropped before transfer from agent was complete. Attempted to call patient but no answer. Agent reports that it was a home health nurse who called and that she may have hung up to call the patient an ambulance. Routing for call back.

## 2023-04-21 NOTE — ED Triage Notes (Signed)
[facility] reports pt coming from home. Pt was doing OT with therapist and had a syncopal episode, Upon EMS arrival no radial pulse and pale. Pt c/o weakness, denies any pain.

## 2023-04-22 ENCOUNTER — Telehealth: Payer: Self-pay

## 2023-04-22 NOTE — Transitions of Care (Post Inpatient/ED Visit) (Signed)
   04/22/2023  Name: Bradley Oconnor MRN: [id number] DOB: 1940/09/21  Today's TOC FU Call Status: Today's TOC FU Call Status:: Unsuccessful Call (1st Attempt) Unsuccessful Call (1st Attempt) Date: 04/22/23  Attempted to reach the patient regarding the most recent Inpatient/ED visit.  Follow Up Plan: Additional outreach attempts will be made to reach the patient to complete the Transitions of Care (Post Inpatient/ED visit) call.   Signature  Karena Addison, LPN [facility] Nurse Health Advisor Direct Dial [phone]

## 2023-04-22 NOTE — Telephone Encounter (Signed)
Noted, ER visit yesterday with syncope, collapse, dehydration.  Discharged home with plan for close follow-up with PCP, please schedule appointment to see me within the next week if possible for ER follow-up.

## 2023-04-22 NOTE — Telephone Encounter (Signed)
Pt went to ED last night FYI

## 2023-04-22 NOTE — Telephone Encounter (Signed)
I called left message he didn't answer.

## 2023-04-22 NOTE — Telephone Encounter (Signed)
Called Gleason back [facility] Nurse) no answer, LM to have her call back asap so we can get an update

## 2023-04-23 ENCOUNTER — Other Ambulatory Visit: Payer: Self-pay | Admitting: Family Medicine

## 2023-04-23 ENCOUNTER — Other Ambulatory Visit: Payer: Self-pay

## 2023-04-23 MED ORDER — APIXABAN 5 MG PO TABS: 5.0000 mg | ORAL_TABLET | Freq: Two times a day (BID) | ORAL | 0 refills | Status: DC

## 2023-04-23 MED ORDER — PANTOPRAZOLE SODIUM 40 MG PO TBEC: 40.0000 mg | DELAYED_RELEASE_TABLET | Freq: Every day | ORAL | 0 refills | Status: DC

## 2023-04-23 NOTE — Telephone Encounter (Signed)
Copied from CRM [id number]. Topic: Clinical - Medication Refill >> Apr 23, 2023 12:51 PM Almira Coaster wrote: Most Recent Primary Care Visit:  Provider: [facility]  Department: [facility]  Visit Type: LAB VISIT  Date: 04/09/2023  Medication: apixaban (ELIQUIS) 5 MG TABS tablet & pantoprazole (PROTONIX) 40 MG tablet  Has the patient contacted their pharmacy? No (Agent: If no, request that the patient contact the pharmacy for the refill. If patient does not wish to contact the pharmacy document the reason why and proceed with request.) (Agent: If yes, when and what did the pharmacy advise?)  Is this the correct pharmacy for this prescription? Yes If no, delete pharmacy and type the correct one.  This is the patient's preferred pharmacy:  [facility] [address], [place] - [address] [place] [address] Phone: [phone] Fax: [phone]     Has the prescription been filled recently? No  Is the patient out of the medication? No  Has the patient been seen for an appointment in the last year OR does the patient have an upcoming appointment? Yes  Can we respond through MyChart? No, patient prefers a phone call  Agent: Please be advised that Rx refills may take up to 3 business days. We ask that you follow-up with your pharmacy.

## 2023-04-24 ENCOUNTER — Emergency Department (HOSPITAL_COMMUNITY): Payer: PPO

## 2023-04-24 ENCOUNTER — Encounter (HOSPITAL_COMMUNITY): Payer: Self-pay

## 2023-04-24 ENCOUNTER — Other Ambulatory Visit: Payer: Self-pay

## 2023-04-24 ENCOUNTER — Emergency Department (HOSPITAL_COMMUNITY)
Admission: EM | Admit: 2023-04-24 | Discharge: 2023-04-24 | Disposition: A | Discharge status: Home / Self Care | Payer: PPO | Attending: Emergency Medicine | Admitting: Emergency Medicine

## 2023-04-24 DIAGNOSIS — Z79899 Other long term (current) drug therapy: Secondary | ICD-10-CM | POA: Diagnosis not present

## 2023-04-24 DIAGNOSIS — U071 COVID-19: Secondary | ICD-10-CM | POA: Diagnosis not present

## 2023-04-24 DIAGNOSIS — I10 Essential (primary) hypertension: Secondary | ICD-10-CM | POA: Diagnosis not present

## 2023-04-24 DIAGNOSIS — R0989 Other specified symptoms and signs involving the circulatory and respiratory systems: Secondary | ICD-10-CM | POA: Diagnosis not present

## 2023-04-24 DIAGNOSIS — E039 Hypothyroidism, unspecified: Secondary | ICD-10-CM | POA: Insufficient documentation

## 2023-04-24 DIAGNOSIS — J029 Acute pharyngitis, unspecified: Secondary | ICD-10-CM | POA: Diagnosis not present

## 2023-04-24 DIAGNOSIS — Z20822 Contact with and (suspected) exposure to covid-19: Secondary | ICD-10-CM | POA: Insufficient documentation

## 2023-04-24 DIAGNOSIS — R059 Cough, unspecified: Secondary | ICD-10-CM | POA: Diagnosis not present

## 2023-04-24 DIAGNOSIS — I2510 Atherosclerotic heart disease of native coronary artery without angina pectoris: Secondary | ICD-10-CM | POA: Insufficient documentation

## 2023-04-24 DIAGNOSIS — Z7901 Long term (current) use of anticoagulants: Secondary | ICD-10-CM | POA: Diagnosis not present

## 2023-04-24 LAB — COMPREHENSIVE METABOLIC PANEL
ALT: 22 U/L (ref 0–44)
AST: 28 U/L (ref 15–41)
Albumin: 3.3 g/dL — ABNORMAL LOW (ref 3.5–5.0)
Alkaline Phosphatase: 86 U/L (ref 38–126)
Anion gap: 12 (ref 5–15)
BUN: 19 mg/dL (ref 8–23)
CO2: 22 mmol/L (ref 22–32)
Calcium: 9.3 mg/dL (ref 8.9–10.3)
Chloride: 102 mmol/L (ref 98–111)
Creatinine, Ser: 1.08 mg/dL (ref 0.61–1.24)
GFR, Estimated: >60 mL/min (ref >60)
Glucose, Bld: 151 mg/dL — ABNORMAL HIGH (ref 70–99)
Potassium: 4.1 mmol/L (ref 3.5–5.1)
Sodium: 136 mmol/L (ref 135–145)
Total Bilirubin: 1 mg/dL (ref 0.0–1.2)
Total Protein: 7.4 g/dL (ref 6.5–8.1)

## 2023-04-24 LAB — CBC WITH DIFFERENTIAL/PLATELET
Abs Immature Granulocytes: 0.05 10*3/uL (ref 0.00–0.07)
Basophils Absolute: 0 10*3/uL (ref 0.0–0.1)
Basophils Relative: 0 %
Eosinophils Absolute: 0.1 10*3/uL (ref 0.0–0.5)
Eosinophils Relative: 1 %
HCT: 39.7 % (ref 39.0–52.0)
Hemoglobin: 13 g/dL (ref 13.0–17.0)
Immature Granulocytes: 1 %
Lymphocytes Relative: 13 %
Lymphs Abs: 1.1 10*3/uL (ref 0.7–4.0)
MCH: 31.3 pg (ref 26.0–34.0)
MCHC: 32.7 g/dL (ref 30.0–36.0)
MCV: 95.4 fL (ref 80.0–100.0)
Monocytes Absolute: 0.5 10*3/uL (ref 0.1–1.0)
Monocytes Relative: 5 %
Neutro Abs: 7.1 10*3/uL (ref 1.7–7.7)
Neutrophils Relative %: 80 %
Platelets: 226 10*3/uL (ref 150–400)
RBC: 4.16 MIL/uL — ABNORMAL LOW (ref 4.22–5.81)
RDW: 14.1 % (ref 11.5–15.5)
WBC: 8.8 10*3/uL (ref 4.0–10.5)
nRBC: 0 % (ref 0.0–0.2)

## 2023-04-24 LAB — SARS CORONAVIRUS 2 BY RT PCR: SARS Coronavirus 2 by RT PCR: POSITIVE — AB

## 2023-04-24 MED ORDER — ACETAMINOPHEN 500 MG PO TABS
1000.0000 mg | ORAL_TABLET | Freq: Once | ORAL | Status: AC
  Administered 2023-04-24: 1000 mg via ORAL
  Filled 2023-04-24: qty 2

## 2023-04-24 NOTE — ED Notes (Signed)
Pt had tylenol prior departure.

## 2023-04-24 NOTE — ED Provider Notes (Signed)
[place] EMERGENCY DEPARTMENT AT [facility] Provider Note   CSN: [id number] Arrival date & time: 04/24/23  1141     History  Chief Complaint  Patient presents with   Covid Positive    Home test   HPI MICA RELEFORD is a 83 y.o. male with CAD, hypertension, hyperlipidemia, GERD, hypothyroidism and persistent atrial fibrillation on Eliquis presenting for cough and sore throat.  States the cough started about 2 days ago and sore throat was this morning.  Also reports some nasal congestion.  Denies shortness of breath and chest pain.  Took a COVID test this morning which was positive.  Denies headache and myalgias.  Took Tylenol last night which helped somewhat.  HPI     Home Medications Prior to Admission medications   Medication Sig Start Date End Date Taking? Authorizing Provider  acetaminophen (TYLENOL) 325 MG tablet Take 650 mg by mouth every 6 (six) hours as needed.    [provider]  apixaban (ELIQUIS) 5 MG TABS tablet Take 1 tablet (5 mg total) by mouth 2 (two) times daily. 04/23/23   [name], MD  cyanocobalamin (VITAMIN B12) 1000 MCG tablet Take 1 tablet (1,000 mcg total) by mouth daily. 03/18/23   [name], MD  folic acid (FOLVITE) 1 MG tablet Take 1 tablet (1 mg total) by mouth daily. 03/01/23   [name], MD  magnesium chloride (SLOW-MAG) 64 MG TBEC SR tablet Take 1 tablet (64 mg total) by mouth daily. 03/18/23   [name], MD  mirtazapine (REMERON SOL-TAB) 15 MG disintegrating tablet Take 1 tablet (15 mg total) by mouth daily. 03/26/23   [name], MD  oxybutynin (DITROPAN) 5 MG tablet Take 1 tablet (5 mg total) by mouth every 8 (eight) hours as needed for bladder spasms. 01/30/23   Burnadette Pop, MD  pantoprazole (PROTONIX) 40 MG tablet Take 1 tablet (40 mg total) by mouth daily. 04/23/23   [name], MD  potassium chloride SA (KLOR-CON M) 20 MEQ tablet Take 1 tablet (20 mEq total) by mouth daily.  02/12/23   [name], MD  psyllium (METAMUCIL) 58.6 % packet Take 1 packet by mouth daily.    [provider]  senna (SENOKOT) 8.6 MG TABS tablet Take 1 tablet (8.6 mg total) by mouth daily. 01/30/23   Burnadette Pop, MD  torsemide (DEMADEX) 20 MG tablet Take 0.5-1 tablets (10-20 mg total) by mouth daily. 04/09/23   [name], MD      Allergies    Procardia [nifedipine] and Phenergan [promethazine hcl]    Review of Systems   See HPI for pertinent positives  Physical Exam Updated Vital Signs BP 127/85 (BP Location: Right Arm)   Pulse (!) 104   Temp 99 F (37.2 C)   Resp 20   Ht 5\' 7"  (1.702 m)   Wt 63.5 kg   SpO2 99%   BMI 21.93 kg/m  Physical Exam Vitals and nursing note reviewed.  HENT:     Head: Normocephalic and atraumatic.     Mouth/Throat:     Mouth: Mucous membranes are moist.  Eyes:     General:        Right eye: No discharge.        Left eye: No discharge.     Conjunctiva/sclera: Conjunctivae normal.  Cardiovascular:     Rate and Rhythm: Normal rate and regular rhythm.     Pulses: Normal pulses.     Heart  sounds: Normal heart sounds.  Pulmonary:     Effort: Pulmonary effort is normal. No respiratory distress.     Breath sounds: Normal breath sounds. No wheezing, rhonchi or rales.  Abdominal:     General: Abdomen is flat.     Palpations: Abdomen is soft.  Skin:    General: Skin is warm and dry.  Neurological:     General: No focal deficit present.  Psychiatric:        Mood and Affect: Mood normal.     ED Results / Procedures / Treatments   Labs (all labs ordered are listed, but only abnormal results are displayed) Labs Reviewed  SARS CORONAVIRUS 2 BY RT PCR - Abnormal; Notable for the following components:      Result Value   SARS Coronavirus 2 by RT PCR POSITIVE (*)    All other components within normal limits  COMPREHENSIVE METABOLIC PANEL - Abnormal; Notable for the following components:   Glucose, Bld 151 (*)     Albumin 3.3 (*)    All other components within normal limits  CBC WITH DIFFERENTIAL/PLATELET - Abnormal; Notable for the following components:   RBC 4.16 (*)    All other components within normal limits    EKG None  Radiology DG Chest 2 View Result Date: 04/24/2023 CLINICAL DATA:  Worsening cough. EXAM: CHEST - 2 VIEW COMPARISON:  04/21/2023. FINDINGS: Low lung volume. Bilateral lung fields are clear. Bilateral costophrenic angles are clear. Normal cardio-mediastinal silhouette. Sternotomy wires noted. No acute osseous abnormalities. The soft tissues are within normal limits. IMPRESSION: No active cardiopulmonary disease. Electronically Signed   By: Jules Schick M.D.   On: 04/24/2023 13:32    Procedures Procedures    Medications Ordered in ED Medications  acetaminophen (TYLENOL) tablet 1,000 mg (1,000 mg Oral Given 04/24/23 1352)    ED Course/ Medical Decision Making/ A&P                                 Medical Decision Making Amount and/or Complexity of Data Reviewed Labs: ordered.  Risk OTC drugs.   83 year old well-appearing male presenting for cough and sore throat along with positive COVID test at home.  Exam was unremarkable.  DDx includes pneumonia, sepsis, respiratory distress, URI, other.  Respiratory PCR was positive for COVID.  I personally reviewed and interpreted chest x-ray which revealed no acute cardiopulmonary process.  On serial reassessments, patient remained well-appearing, nontoxic and in no evidence of respiratory distress.  Was called to the bedside because it was reported by family that patient was having difficulty with coordination of bring the water bottle to his mouth.  This was a new symptom during his encounter.  Dr. Anitra Lauth and I both evaluated him and did a thorough neuroexam and did not appreciate any focal neurodeficits and patient denied dizziness or issues with coordination.  Consider Paxlovid the patient is on Eliquis thus contraindicated.   Advised supportive treatment and discussed strict return precautions should his symptoms worsen.  Vies him to follow-up PCP.  Discharged in good condition.        Final Clinical Impression(s) / ED Diagnoses Final diagnoses:  COVID    Rx / DC Orders ED Discharge Orders     None         Gareth Eagle, PA-C 04/24/23 1415    Gwyneth Sprout, MD 04/26/23 1457

## 2023-04-24 NOTE — ED Provider Triage Note (Signed)
Emergency Medicine Provider Triage Evaluation Note  Bradley Oconnor , a 83 y.o. male  was evaluated in triage.  Pt complains of 1 day Hx of cough, congestion, + COVID test. Denies sick contacts.   Denies fevers, headaches, chest pain, dyspnea, abdominal pain, n/v/d, dysuria  Review of Systems  Positive: See above Negative: See above  Physical Exam  BP 127/85 (BP Location: Right Arm)   Pulse (!) 104   Temp 99 F (37.2 C)   Resp 20   Ht 5\' 7"  (1.702 m)   Wt 63.5 kg   SpO2 99%   BMI 21.93 kg/m  Gen:   Awake, no distress   Resp:  Normal effort  MSK:   Moves extremities without difficulty  Other:    Medical Decision Making  Medically screening exam initiated at 12:01 PM.  Appropriate orders placed.  Bradley Oconnor was informed that the remainder of the evaluation will be completed by another provider, this initial triage assessment does not replace that evaluation, and the importance of remaining in the ED until their evaluation is complete.     Bradley Oconnor, [place] 04/24/23 1204

## 2023-04-24 NOTE — ED Triage Notes (Signed)
Pt presents with a + home COVID test along with new cough, and tremors. Pt was in the ED 3 days previously for near-syncope and dehydration, but today's symptoms are new. Per pt's brother pt was c/o sore throat as well. Pt denies ShOB, HA, myalgias.

## 2023-04-24 NOTE — Discharge Instructions (Addendum)
Evaluation today revealed that you do have COVID.  This likely explains your symptoms.  Recommend supportive treatment at home which includes rest, hydration and Tylenol and ibuprofen as needed for symptomatic relief and fever control.  If you have any issues with shortness of breath, chest pain, fever that cannot be controlled, lethargy or any other concerning symptom please return to the emergency department further evaluation.  Otherwise recommend follow-up with your PCP.

## 2023-04-26 NOTE — Transitions of Care (Post Inpatient/ED Visit) (Unsigned)
   04/26/2023  Name: Bradley Oconnor MRN: [id number] DOB: May 30, 1940  Today's TOC FU Call Status: Today's TOC FU Call Status:: Unsuccessful Call (2nd Attempt) Unsuccessful Call (1st Attempt) Date: 04/22/23 Unsuccessful Call (2nd Attempt) Date: 04/26/23  Attempted to reach the patient regarding the most recent Inpatient/ED visit.  Follow Up Plan: Additional outreach attempts will be made to reach the patient to complete the Transitions of Care (Post Inpatient/ED visit) call.   Signature Karena Addison, LPN [facility] Nurse Health Advisor Direct Dial [phone]

## 2023-04-27 ENCOUNTER — Telehealth: Payer: Self-pay | Admitting: Family Medicine

## 2023-04-27 ENCOUNTER — Telehealth (INDEPENDENT_AMBULATORY_CARE_PROVIDER_SITE_OTHER): Payer: PPO | Admitting: Family Medicine

## 2023-04-27 ENCOUNTER — Encounter: Payer: Self-pay | Admitting: Family Medicine

## 2023-04-27 DIAGNOSIS — U071 COVID-19: Secondary | ICD-10-CM | POA: Diagnosis not present

## 2023-04-27 NOTE — Telephone Encounter (Signed)
This was discussed at visit 04/27/2023 virtually with Dr Beverely Low and patient has Hospital follow up 04/29/2023

## 2023-04-27 NOTE — Telephone Encounter (Signed)
Typically a few doses of Imodium are okay but with his history of CHF there is a caution with Imodium and history of CHF.  If he has only had a few episodes it may be best to encourage hydration, and hold on new meds.  It is likely related to his COVID infection.  If he is having frequent diarrhea or persisting into tomorrow, recommend office visit or at minimum virtual visit to discuss symptoms and plan.  Thanks

## 2023-04-27 NOTE — Transitions of Care (Post Inpatient/ED Visit) (Signed)
   04/27/2023  Name: Bradley Oconnor MRN: [id number] DOB: Aug 06, 1940  Today's TOC FU Call Status: Today's TOC FU Call Status:: Unsuccessful Call (3rd Attempt) Unsuccessful Call (1st Attempt) Date: 04/22/23 Unsuccessful Call (2nd Attempt) Date: 04/26/23 Unsuccessful Call (3rd Attempt) Date: 04/27/23  Attempted to reach the patient regarding the most recent Inpatient/ED visit.  Follow Up Plan: No further outreach attempts will be made at this time. We have been unable to contact the patient.  Signature Karena Addison, LPN [facility] Nurse Health Advisor Direct Dial [phone]

## 2023-04-27 NOTE — Progress Notes (Signed)
Virtual Visit via Video   I connected with patient on 04/27/23 at 11:20 AM EST by a video enabled telemedicine application and verified that I am speaking with the correct person using two identifiers.  Location patient: Home Location provider: [facility], Office Persons participating in the virtual visit: Patient, Provider, CMA Archie Patten H)  I discussed the limitations of evaluation and management by telemedicine and the availability of in person appointments. The patient expressed understanding and agreed to proceed.  Subjective:   HPI:   COVID- pt was seen in the ER on 1/22 for syncope.  EMS found him w/ BP of 90s and no radial pulses.  He improved w/ IVF.  He returned to the ER on 1/25 for COVID.  At that time, pt was well appearing.  Paxlovid is contraindicated due to his use of Eliquis.    Today pt reports 'feeling pretty good'.  Is eating and drinking.  No issues w/ urination or hydration.  No fever.  Pt reports cough will come and go.  Denies increased SOB.  Is having diarrhea.  ROS:   See pertinent positives and negatives per HPI.  Patient Active Problem List   Diagnosis Date Noted   Cholelithiasis 01/26/2023   Intractable lower abdominal pain 01/26/2023   Prolonged QT interval 12/21/2022   Malnutrition of moderate degree 12/19/2022   Persistent atrial fibrillation (HCC) 12/15/2022   Chronic combined systolic and diastolic CHF (congestive heart failure) (HCC) 12/15/2022   Pressure injury of skin 12/15/2022   Low back pain 08/07/2020   Trigger finger of right thumb 04/17/2020   Carpal tunnel syndrome, bilateral 09/26/2019   Shoulder pain, bilateral 08/03/2019   Cervicalgia 03/28/2018   GERD (gastroesophageal reflux disease) 04/10/2013   HTN (hypertension) 04/10/2013   CAD (coronary artery disease) of artery bypass graft 10/04/2012   Hypothyroidism 10/04/2012   Hyperlipidemia with target LDL less than 70 10/04/2012   Kidney stones 01/27/2012   Hx of  appendectomy-history of ruptured requiring ileocecectomy (~1994) 01/27/2012    Social History   Tobacco Use   Smoking status: Never   Smokeless tobacco: Never  Substance Use Topics   Alcohol use: No    Alcohol/week: 0.0 standard drinks of alcohol    Current Outpatient Medications:    acetaminophen (TYLENOL) 325 MG tablet, Take 650 mg by mouth every 6 (six) hours as needed., Disp: , Rfl:    apixaban (ELIQUIS) 5 MG TABS tablet, Take 1 tablet (5 mg total) by mouth 2 (two) times daily., Disp: 60 tablet, Rfl: 0   cyanocobalamin (VITAMIN B12) 1000 MCG tablet, Take 1 tablet (1,000 mcg total) by mouth daily., Disp: 90 tablet, Rfl: 0   folic acid (FOLVITE) 1 MG tablet, Take 1 tablet (1 mg total) by mouth daily., Disp: , Rfl:    magnesium chloride (SLOW-MAG) 64 MG TBEC SR tablet, Take 1 tablet (64 mg total) by mouth daily., Disp: 30 tablet, Rfl: 1   mirtazapine (REMERON SOL-TAB) 15 MG disintegrating tablet, Take 1 tablet (15 mg total) by mouth daily., Disp: 30 tablet, Rfl: 3   oxybutynin (DITROPAN) 5 MG tablet, Take 1 tablet (5 mg total) by mouth every 8 (eight) hours as needed for bladder spasms., Disp: 30 tablet, Rfl: 0   pantoprazole (PROTONIX) 40 MG tablet, Take 1 tablet (40 mg total) by mouth daily., Disp: 30 tablet, Rfl: 0   potassium chloride SA (KLOR-CON M) 20 MEQ tablet, Take 1 tablet (20 mEq total) by mouth daily., Disp: 90 tablet, Rfl: 0   psyllium (  METAMUCIL) 58.6 % packet, Take 1 packet by mouth daily., Disp: , Rfl:    senna (SENOKOT) 8.6 MG TABS tablet, Take 1 tablet (8.6 mg total) by mouth daily., Disp: 14 tablet, Rfl: 0   torsemide (DEMADEX) 20 MG tablet, Take 0.5-1 tablets (10-20 mg total) by mouth daily., Disp: 30 tablet, Rfl: 0  Allergies  Allergen Reactions   Procardia [Nifedipine] Other (See Comments)    Hypotension    Phenergan [Promethazine Hcl] Nausea And Vomiting    Objective:   There were no vitals taken for this visit. AAOx3, NAD NCAT, EOMI No obvious CN  deficits Coloring WNL Pt is able to speak clearly, coherently without shortness of breath or increased work of breathing.  Thought process is linear.  Mood is appropriate.   Assessment and Plan:   COVID- new.  Pt was dx'd on 1/25 in the ER.  Not a candidate for Paxlovid due to Eliquis use.  Pt reports feeling fairly well today.  Treating cough w/ OTC products.  Having diarrhea- encouraged Imodium as needed.  Reviewed supportive care and red flags that should prompt return.  Pt expressed understanding and is in agreement w/ plan.    Neena Rhymes, MD 04/27/2023

## 2023-04-27 NOTE — Telephone Encounter (Signed)
Pt has visit scheduled 04/29/2023

## 2023-04-27 NOTE — Telephone Encounter (Signed)
Caller name: Selena Batten - Caregiver   On DPR?: Yes  Call back number: [phone]   Provider they see: [name], MD  Reason for call: She would like at ask [name] what could she give Bradley Oconnor he has just a little diarrhea. Requesting a call back

## 2023-04-27 NOTE — Telephone Encounter (Signed)
Patient does have virtual visit today but I do need to call patient and set up hospital follow up for Syncopal episode on 04/26/2023 will call after virtual appointment has been completed a this morning 11:00am

## 2023-04-27 NOTE — Telephone Encounter (Signed)
What can I safely advise OTC for diarrhea? I know Imodium is typical but would like to ensure this is safe for patient

## 2023-04-29 ENCOUNTER — Encounter: Payer: Self-pay | Admitting: Family Medicine

## 2023-04-29 ENCOUNTER — Inpatient Hospital Stay: Payer: PPO | Admitting: Family Medicine

## 2023-04-29 ENCOUNTER — Other Ambulatory Visit: Payer: Self-pay | Admitting: Family Medicine

## 2023-04-29 MED ORDER — POTASSIUM CHLORIDE CRYS ER 20 MEQ PO TBCR: 20.0000 meq | EXTENDED_RELEASE_TABLET | Freq: Every day | ORAL | 0 refills | Status: DC

## 2023-04-29 NOTE — Telephone Encounter (Signed)
Copied from CRM [id number]. Topic: Clinical - Medication Refill >> Apr 29, 2023 10:10 AM Denese Killings wrote: Most Recent Primary Care Visit:  Provider: [facility]  Department: [facility]  Visit Type: LAB VISIT  Date: 04/09/2023  Medication: potassium chloride SA (KLOR-CON M) 20 MEQ tablet  Has the patient contacted their pharmacy? Yes (Agent: If no, request that the patient contact the pharmacy for the refill. If patient does not wish to contact the pharmacy document the reason why and proceed with request.) (Agent: If yes, when and what did the pharmacy advise?)  Is this the correct pharmacy for this prescription? Yes If no, delete pharmacy and type the correct one.  This is the patient's preferred pharmacy:  [facility] [address] [place] - [address] [place] [address] Phone: [phone] Fax: [phone]  Has the prescription been filled recently? Yes  Is the patient out of the medication? No  Has the patient been seen for an appointment in the last year OR does the patient have an upcoming appointment? Yes  Can we respond through MyChart? Yes  Agent: Please be advised that Rx refills may take up to 3 business days. We ask that you follow-up with your pharmacy.

## 2023-05-04 ENCOUNTER — Ambulatory Visit: Payer: PPO

## 2023-05-04 ENCOUNTER — Ambulatory Visit: Payer: PPO | Admitting: Physician Assistant

## 2023-05-06 ENCOUNTER — Encounter: Payer: Self-pay | Admitting: Family Medicine

## 2023-05-06 ENCOUNTER — Ambulatory Visit: Payer: PPO | Admitting: Family Medicine

## 2023-05-06 VITALS — BP 116/64 | HR 85 | Temp 98.6°F | Ht 68.0 in | Wt 136.4 lb

## 2023-05-06 DIAGNOSIS — K5900 Constipation, unspecified: Secondary | ICD-10-CM | POA: Diagnosis not present

## 2023-05-06 DIAGNOSIS — U071 COVID-19: Secondary | ICD-10-CM

## 2023-05-06 DIAGNOSIS — E869 Volume depletion, unspecified: Secondary | ICD-10-CM | POA: Diagnosis not present

## 2023-05-06 DIAGNOSIS — R413 Other amnesia: Secondary | ICD-10-CM

## 2023-05-06 DIAGNOSIS — R55 Syncope and collapse: Secondary | ICD-10-CM | POA: Diagnosis not present

## 2023-05-06 DIAGNOSIS — R630 Anorexia: Secondary | ICD-10-CM | POA: Diagnosis not present

## 2023-05-06 DIAGNOSIS — Z862 Personal history of diseases of the blood and blood-forming organs and certain disorders involving the immune mechanism: Secondary | ICD-10-CM

## 2023-05-06 DIAGNOSIS — F4320 Adjustment disorder, unspecified: Secondary | ICD-10-CM | POA: Diagnosis not present

## 2023-05-06 LAB — MAGNESIUM: Magnesium: 2.1 mg/dL (ref 1.5–2.5)

## 2023-05-06 LAB — BASIC METABOLIC PANEL
BUN: 36 mg/dL — ABNORMAL HIGH (ref 6–23)
CO2: 26 meq/L (ref 19–32)
Calcium: 10.4 mg/dL (ref 8.4–10.5)
Chloride: 99 meq/L (ref 96–112)
Creatinine, Ser: 1.35 mg/dL (ref 0.40–1.50)
GFR: 48.48 mL/min — ABNORMAL LOW (ref >60.00)
Glucose, Bld: 107 mg/dL — ABNORMAL HIGH (ref 70–99)
Potassium: 4.8 meq/L (ref 3.5–5.1)
Sodium: 134 meq/L — ABNORMAL LOW (ref 135–145)

## 2023-05-06 LAB — CBC
HCT: 42.6 % (ref 39.0–52.0)
Hemoglobin: 14.1 g/dL (ref 13.0–17.0)
MCHC: 33.1 g/dL (ref 30.0–36.0)
MCV: 93.7 fL (ref 78.0–100.0)
Platelets: 406 10*3/uL — ABNORMAL HIGH (ref 150.0–400.0)
RBC: 4.55 Mil/uL (ref 4.22–5.81)
RDW: 14.3 % (ref 11.5–15.5)
WBC: 10.4 10*3/uL (ref 4.0–10.5)

## 2023-05-06 NOTE — Patient Instructions (Addendum)
 Miralax  once today, then if persistent hard stools, take colace over the counter daily. Make sure to drink plenty of fluids throughout the day.  I think appetite will improve as you continue to recover from COVID infection.  No change in mirtazapine  dose for now.  I did not change any mood medications today or depression medications today as I would like to see what is recommended by neurology tomorrow as they may recommend certain medications to help with some of your symptoms.  Follow-up with me in 1 month.  Happy to see you sooner if any new or worsening symptoms.  Hang in there.

## 2023-05-06 NOTE — Progress Notes (Signed)
 Subjective:  Patient ID: Bradley Oconnor, male    DOB: December 19, 1940  Age: 83 y.o. MRN: [id number]  CC:  Chief Complaint  Patient presents with   Hospitalization Follow-up    Seen on 04/21/23 at [facility] ED, dx syncope and collapse; dehydration. Then seen on 04/24/23 at [facility] ED, dx Covid.     HPI Bradley Oconnor presents for  Follow-up ED visit.  Patient seen in office today. Transition of care visit.  ED visit on 04/21/2023 with syncope and collapse, thought to be due to dehydration.  Transition of care call attempted but unsuccessful after that ER visit.  ER note reviewed from 04/21/2023.  He was doing some physical therapy at home, became acutely weak, possible brief syncopal event.  EMS responded with blood pressure in the 90s, IV fluids were given and improved during transport.  Felt mostly back to baseline, no chest pain or dyspnea vomiting diarrhea or urinary symptoms.  Smaller breakfast than usual that day.  No active disease on chest x-ray portable 1 view, EKG with left ventricular hypertrophy, inferior infarct, old, prolonged QT interval.  Creatinine 1.31, glucose 166, hemoglobin 12.5.  Troponins were both negative.  Urinalysis without concern and other labs were reassuring.  Mildly orthostatic after 1 L, second liter IV fluids given and discharged home with improved symptoms.  Repeat ED visit on 04/24/2023.  COVID infection. Sore throat, cough started 2 days prior.  No shortness of breath or chest pain.  Positive home COVID test.  Exam unremarkable.  Chest x-ray without acute cardiopulmonary process.  1 episode of possible difficulty with bringing the water  bottle to his mouth but neuroexam by ED provider without any focal neurodeficits or further issues with coordination.  Paxlovid  was contraindicated due to anticoagulation.  Video visit with my colleague on 04/27/2023, feeling fairly well that day and treating cough with over-the-counter products.  Imodium as needed for diarrhea and supportive care,  red flags were discussed. Hgb 13.0 on 1/25. Creatinine improved to 1.08.   Since 1/28.  Cough, sore throat -resolved.  Diarrhea - no recent diarrhea. Straining to have BM last night.  Hard stool with small amt blood with wiping only - no recurrence. Small hard stool this am. No blood. Normal BM 2 days ago. No recent immodium - only used twice initially with covid.  Not wanting to drink/eat. Decreased appetite.  Here with caregiver Suzen and brother Sheena.  Only eating small amounts - decreased appetite with covid. Prior appetite was doing well prior to infection. No abd pain - just small amounts then full.  Encouraging water , OJ, boost, protein shakes.  On mirtazapine  - 15mg  - taking during the day. Teary eyed, upset at times. Agitated at times. Sleeping ok. Feeling more down. Referred to neurology for suspected dementia prior. Appt with Camie Sevin tomorrow.  Taking toresemide 1/2 tab per day.  No new swelling. Cardiology 06/29/23. Dr. Burnard.  Last uop - few hours ago.  On mag 64mg  every day. Level of 1.5 on 04/09/23.     History Patient Active Problem List   Diagnosis Date Noted   Cholelithiasis 01/26/2023   Intractable lower abdominal pain 01/26/2023   Prolonged QT interval 12/21/2022   Malnutrition of moderate degree 12/19/2022   Persistent atrial fibrillation (HCC) 12/15/2022   Chronic combined systolic and diastolic CHF (congestive heart failure) (HCC) 12/15/2022   Pressure injury of skin 12/15/2022   Low back pain 08/07/2020   Trigger finger of right thumb 04/17/2020   Carpal tunnel syndrome,  bilateral 09/26/2019   Shoulder pain, bilateral 08/03/2019   Cervicalgia 03/28/2018   GERD (gastroesophageal reflux disease) 04/10/2013   HTN (hypertension) 04/10/2013   CAD (coronary artery disease) of artery bypass graft 10/04/2012   Hypothyroidism 10/04/2012   Hyperlipidemia with target LDL less than 70 10/04/2012   Kidney stones 01/27/2012   Hx of appendectomy-history of  ruptured requiring ileocecectomy (~1994) 01/27/2012   Past Medical History:  Diagnosis Date   Blood transfusion without reported diagnosis    CAD (coronary artery disease)    GERD (gastroesophageal reflux disease)    Heart attack (HCC)    Hyperlipidemia    Hypertension 03/07/2012   ECHO-WNL     08/12/11 Lexiscan MyoviewNo significant ischemia demonstrated Low risk scan There is a moderate sized dense scar in the LCX territoy unchanged from the prior study.. Post- stress EF is 40%.   Septic shock (HCC) 12/10/2022   Thyroid  disease    Past Surgical History:  Procedure Laterality Date   APPENDECTOMY     CERVICAL SPINE SURGERY     titanium plate in the back of neck   CORONARY ARTERY BYPASS GRAFT     HERNIA REPAIR     SMALL INTESTINE SURGERY     THYROID  SURGERY     1/2 thyroid  removed on right side   Allergies  Allergen Reactions   Procardia [Nifedipine] Other (See Comments)    Hypotension    Phenergan [Promethazine Hcl] Nausea And Vomiting   Prior to Admission medications   Medication Sig Start Date End Date Taking? Authorizing Provider  acetaminophen  (TYLENOL ) 325 MG tablet Take 650 mg by mouth every 6 (six) hours as needed.   Yes [provider]  apixaban  (ELIQUIS ) 5 MG TABS tablet Take 1 tablet (5 mg total) by mouth 2 (two) times daily. 04/23/23  Yes Levora Reyes SAUNDERS, MD  cyanocobalamin  (VITAMIN B12) 1000 MCG tablet Take 1 tablet (1,000 mcg total) by mouth daily. 03/18/23  Yes Levora Reyes SAUNDERS, MD  folic acid  (FOLVITE ) 1 MG tablet Take 1 tablet (1 mg total) by mouth daily. 03/01/23  Yes Levora Reyes SAUNDERS, MD  magnesium  chloride (SLOW-MAG) 64 MG TBEC SR tablet Take 1 tablet (64 mg total) by mouth daily. 03/18/23  Yes Levora Reyes SAUNDERS, MD  mirtazapine  (REMERON  SOL-TAB) 15 MG disintegrating tablet Take 1 tablet (15 mg total) by mouth daily. 03/26/23  Yes Levora Reyes SAUNDERS, MD  oxybutynin  (DITROPAN ) 5 MG tablet Take 1 tablet (5 mg total) by mouth every 8 (eight) hours as  needed for bladder spasms. 01/30/23  Yes Jillian Buttery, MD  pantoprazole  (PROTONIX ) 40 MG tablet Take 1 tablet (40 mg total) by mouth daily. 04/23/23  Yes Levora Reyes SAUNDERS, MD  potassium chloride  SA (KLOR-CON  M) 20 MEQ tablet Take 1 tablet (20 mEq total) by mouth daily. 04/29/23  Yes Levora Reyes SAUNDERS, MD  psyllium (METAMUCIL) 58.6 % packet Take 1 packet by mouth daily.   Yes [provider]  senna (SENOKOT) 8.6 MG TABS tablet Take 1 tablet (8.6 mg total) by mouth daily. 01/30/23  Yes Jillian Buttery, MD  torsemide  (DEMADEX ) 20 MG tablet Take 0.5-1 tablets (10-20 mg total) by mouth daily. 04/09/23  Yes Levora Reyes SAUNDERS, MD   Social History   Socioeconomic History   Marital status: Married    Spouse name: Not on file   Number of children: Not on file   Years of education: 16   Highest education level: Some college, no degree  Occupational History   Occupation: Retired  Tobacco Use   Smoking status: Never   Smokeless tobacco: Never  Vaping Use   Vaping status: Never Used  Substance and Sexual Activity   Alcohol use: No    Alcohol/week: 0.0 standard drinks of alcohol   Drug use: No   Sexual activity: Never  Other Topics Concern   Not on file  Social History Narrative   Married.    Social Drivers of Corporate Investment Banker Strain: Low Risk  (07/16/2022)   Overall Financial Resource Strain (CARDIA)    Difficulty of Paying Living Expenses: Not hard at all  Food Insecurity: No Food Insecurity (01/26/2023)   Hunger Vital Sign    Worried About Running Out of Food in the Last Year: Never true    Ran Out of Food in the Last Year: Never true  Transportation Needs: No Transportation Needs (01/26/2023)   PRAPARE - Administrator, Civil Service (Medical): No    Lack of Transportation (Non-Medical): No  Physical Activity: Insufficiently Active (07/16/2022)   Exercise Vital Sign    Days of Exercise per Week: 3 days    Minutes of Exercise per Session: 30 min   Stress: No Stress Concern Present (07/16/2022)   [employer] of Occupational Health - Occupational Stress Questionnaire    Feeling of Stress : Not at all  Social Connections: Moderately Isolated (07/16/2022)   Social Connection and Isolation Panel [NHANES]    Frequency of Communication with Friends and Family: Three times a week    Frequency of Social Gatherings with Friends and Family: Three times a week    Attends Religious Services: Never    Active Member of Clubs or Organizations: No    Attends Banker Meetings: Never    Marital Status: Married  Catering Manager Violence: Not At Risk (01/26/2023)   Humiliation, Afraid, Rape, and Kick questionnaire    Fear of Current or Ex-Partner: No    Emotionally Abused: No    Physically Abused: No    Sexually Abused: No    Review of Systems   Objective:   Vitals:   05/06/23 1100  BP: 116/64  Pulse: 85  Temp: 98.6 F (37 C)  TempSrc: Temporal  SpO2: 96%  Weight: 136 lb 6 oz (61.9 kg)  Height: 5' 8 (1.727 m)     Physical Exam Vitals reviewed.  Constitutional:      Appearance: He is well-developed.  HENT:     Head: Normocephalic and atraumatic.  Neck:     Vascular: No carotid bruit or JVD.  Cardiovascular:     Rate and Rhythm: Normal rate and regular rhythm.     Heart sounds: Normal heart sounds. No murmur heard. Pulmonary:     Effort: Pulmonary effort is normal.     Breath sounds: Normal breath sounds. No rales.  Abdominal:     General: There is no distension.     Tenderness: There is no abdominal tenderness.  Musculoskeletal:     Right lower leg: No edema.     Left lower leg: No edema.  Skin:    General: Skin is warm and dry.  Neurological:     Mental Status: He is alert and oriented to person, place, and time.  Psychiatric:        Mood and Affect: Mood normal.        Assessment & Plan:  Bradley Oconnor is a 83 y.o. male . Volume depletion - Plan: Basic metabolic panel Syncope,  unspecified syncope type -  Plan: Basic metabolic panel  -Initial ER visit with likely volume depletion as cause of syncope, improved with IV fluids.  Initial CBC, BMP with abnormalities above, improved on repeat testing on the 25th.  Repeat testing today.  Continue to encourage fluids.  Appetite decreased may be related to COVID infection, potentially from depression mood symptoms or dementia.  Has follow-up with neuro tomorrow.  Will hold on new meds for now.  Continue Remeron  same dose for now.  COVID-19 virus infection  -Improved, previous diarrhea resolved, now with constipation as below.  Bowel regimen discussed with RTC precautions given.  Labs as above.  Decreased appetite  -As above decreased appetite may be multifactorial.  Continue Remeron  same dose for now with continued monitoring.  Adjustment disorder, unspecified type Memory difficulties  -Caregiver reports some agitation, irritability.  Thought to have component of adjustment disorder but may be depression on top of underlying cognitive decline.  Some behavioral/agitation concerns as above.  Given close follow-up with neuro I held on new medications at this time until recommendations provided at his visit tomorrow.  Will continue Remeron  same dose for now.   Constipation, unspecified constipation type  -Single treatment of MiraLAX , then Colace if stable.  RTC precautions.  Encouraged hydration.  History of anemia - Plan: CBC  Hypomagnesemia - Plan: Magnesium  Magnesium  level normal on current dosing of supplement previously.  Repeat testing and adjustment of plan accordingly.  No orders of the defined types were placed in this encounter.  Patient Instructions  Miralax  once today, then if persistent hard stools, take colace over the counter daily. Make sure to drink plenty of fluids throughout the day.  I think appetite will improve as you continue to recover from COVID infection.  No change in mirtazapine  dose for now.  I did  not change any mood medications today or depression medications today as I would like to see what is recommended by neurology tomorrow as they may recommend certain medications to help with some of your symptoms.  Follow-up with me in 1 month.  Happy to see you sooner if any new or worsening symptoms.  Hang in there.     Signed,   Reyes Pines, MD [facility], [facility] 05/06/23 12:53 PM

## 2023-05-07 ENCOUNTER — Encounter: Payer: Self-pay | Admitting: Physician Assistant

## 2023-05-07 ENCOUNTER — Ambulatory Visit: Payer: PPO | Admitting: Physician Assistant

## 2023-05-07 ENCOUNTER — Other Ambulatory Visit: Payer: PPO

## 2023-05-07 ENCOUNTER — Ambulatory Visit: Payer: PPO

## 2023-05-07 VITALS — BP 103/64 | HR 79 | Resp 18 | Ht 67.0 in

## 2023-05-07 DIAGNOSIS — F325 Major depressive disorder, single episode, in full remission: Secondary | ICD-10-CM

## 2023-05-07 DIAGNOSIS — R413 Other amnesia: Secondary | ICD-10-CM | POA: Diagnosis not present

## 2023-05-07 MED ORDER — MEMANTINE HCL 5 MG PO TABS: ORAL_TABLET | ORAL | 11 refills | Status: DC

## 2023-05-07 NOTE — Patient Instructions (Addendum)
 It was a pleasure to see you today at our office.   Recommendations:  Neurocognitive evaluation at our office   Check labs today   Referral to Behavioral Therapy for depression PTSD  Start Memantine  5mg  tablets.  Take 1 tablet at bedtime for 2 weeks, then 1 tablet twice daily.   Follow up in  3 months Recommend antidepressant with your primary doctor.  Recommend visiting the website :  Dementia Success Path to better understand some behaviors related to memory loss.    For psychiatric meds, mood meds: Please have your primary care physician manage these medications.  If you have any severe symptoms of a stroke, or other severe issues such as confusion,severe chills or fever, etc call 911 or go to the ER as you may need to be evaluated further   For guidance regarding [facility] and if placement were needed at the facility, contact Social Worker tel: [phone]  For assessment of decision of mental capacity and competency:  Call Dr. Rosaline Nine, geriatric psychiatrist at [phone]  Counseling regarding caregiver distress, including caregiver depression, anxiety and issues regarding community resources, adult day care programs, adult living facilities, or memory care questions:  please contact your  Primary Doctor's Social Worker   Whom to call: Memory  decline, memory medications: Call our office [phone]    [email or web address]   RECOMMENDATIONS FOR ALL PATIENTS WITH MEMORY PROBLEMS: 1. Continue to exercise (Recommend 30 minutes of walking everyday, or 3 hours every week) 2. Increase social interactions - continue going to [place] and enjoy social gatherings with friends and family 3. Eat healthy, avoid fried foods and eat more fruits and vegetables 4. Maintain adequate blood pressure, blood sugar, and blood cholesterol level. Reducing the risk of stroke and cardiovascular disease also helps  promoting better memory. 5. Avoid stressful situations. Live a simple life and avoid aggravations. Organize your time and prepare for the next day in anticipation. 6. Sleep well, avoid any interruptions of sleep and avoid any distractions in the bedroom that may interfere with adequate sleep quality 7. Avoid sugar, avoid sweets as there is a strong link between excessive sugar intake, diabetes, and cognitive impairment We discussed the Mediterranean diet, which has been shown to help patients reduce the risk of progressive memory disorders and reduces cardiovascular risk. This includes eating fish, eat fruits and green leafy vegetables, nuts like almonds and hazelnuts, walnuts, and also use olive oil. Avoid fast foods and fried foods as much as possible. Avoid sweets and sugar as sugar use has been linked to worsening of memory function.  There is always a concern of gradual progression of memory problems. If this is the case, then we may need to adjust level of care according to patient needs. Support, both to the patient and caregiver, should then be put into place.      You have been referred for a neuropsychological evaluation (i.e., evaluation of memory and thinking abilities). Please bring someone with you to this appointment if possible, as it is helpful for the doctor to hear from both you and another adult who knows you well. Please bring eyeglasses and hearing aids if you wear them.    The evaluation will take approximately 3 hours and has two parts:   The first part is a clinical interview with the neuropsychologist (Dr. Richie or Dr. Jackquline). During the interview, the neuropsychologist will speak with you and the individual you brought to the appointment.    The second part of  the evaluation is testing with the doctor's technician Neal or Luke). During the testing, the technician will ask you to remember different types of material, solve problems, and answer some questionnaires. Your  family member will not be present for this portion of the evaluation.   Please note: We must reserve several hours of the neuropsychologist's time and the psychometrician's time for your evaluation appointment. As such, there is a No-Show fee of $100. If you are unable to attend any of your appointments, please contact our office as soon as possible to reschedule.      DRIVING: Regarding driving, in patients with progressive memory problems, driving will be impaired. We advise to have someone else do the driving if trouble finding directions or if minor accidents are reported. Independent driving assessment is available to determine safety of driving.   If you are interested in the driving assessment, you can contact the following:  [employer] in [place] [phone]  [facility] [phone]  [facility] [phone]  [facility] [phone] or [phone]   FALL PRECAUTIONS: Be cautious when walking. Scan the area for obstacles that may increase the risk of trips and falls. When getting up in the mornings, sit up at the edge of the bed for a few minutes before getting out of bed. Consider elevating the bed at the head end to avoid drop of blood pressure when getting up. Walk always in a well-lit room (use night lights in the walls). Avoid area rugs or power cords from appliances in the middle of the walkways. Use a walker or a cane if necessary and consider physical therapy for balance exercise. Get your eyesight checked regularly.  FINANCIAL OVERSIGHT: Supervision, especially oversight when making financial decisions or transactions is also recommended.  HOME SAFETY: Consider the safety of the kitchen when operating appliances like stoves, microwave oven, and blender. Consider having supervision and share cooking responsibilities until no longer able to participate in those. Accidents with firearms and other hazards in the house should  be identified and addressed as well.   ABILITY TO BE LEFT ALONE: If patient is unable to contact 911 operator, consider using LifeLine, or when the need is there, arrange for someone to stay with patients. Smoking is a fire hazard, consider supervision or cessation. Risk of wandering should be assessed by caregiver and if detected at any point, supervision and safe proof recommendations should be instituted.  MEDICATION SUPERVISION: Inability to self-administer medication needs to be constantly addressed. Implement a mechanism to ensure safe administration of the medications.      Mediterranean Diet A Mediterranean diet refers to food and lifestyle choices that are based on the traditions of countries located on the [employer] [place]. This way of eating has been shown to help prevent certain conditions and improve outcomes for people who have chronic diseases, like kidney disease and heart disease. What are tips for following this plan? Lifestyle  Cook and eat meals together with your family, when possible. Drink enough fluid to keep your urine clear or pale yellow. Be physically active every day. This includes: Aerobic exercise like running or swimming. Leisure activities like gardening, walking, or housework. Get 7-8 hours of sleep each night. If recommended by your health care provider, drink red wine in moderation. This means 1 glass a day for nonpregnant women and 2 glasses a day for men. A glass of wine equals 5 oz (150 mL). Reading food labels  Check the serving size of packaged foods. For  foods such as rice and pasta, the serving size refers to the amount of cooked product, not dry. Check the total fat in packaged foods. Avoid foods that have saturated fat or trans fats. Check the ingredients list for added sugars, such as corn syrup. Shopping  At the grocery store, buy most of your food from the areas near the walls of the store. This includes: Fresh fruits and vegetables  (produce). Grains, beans, nuts, and seeds. Some of these may be available in unpackaged forms or large amounts (in bulk). Fresh seafood. Poultry and eggs. Low-fat dairy products. Buy whole ingredients instead of prepackaged foods. Buy fresh fruits and vegetables in-season from local farmers markets. Buy frozen fruits and vegetables in resealable bags. If you do not have access to quality fresh seafood, buy precooked frozen shrimp or canned fish, such as tuna, salmon, or sardines. Buy small amounts of raw or cooked vegetables, salads, or olives from the deli or salad bar at your store. Stock your pantry so you always have certain foods on hand, such as olive oil, canned tuna, canned tomatoes, rice, pasta, and beans. Cooking  Cook foods with extra-virgin olive oil instead of using butter or other vegetable oils. Have meat as a side dish, and have vegetables or grains as your main dish. This means having meat in small portions or adding small amounts of meat to foods like pasta or stew. Use beans or vegetables instead of meat in common dishes like chili or lasagna. Experiment with different cooking methods. Try roasting or broiling vegetables instead of steaming or sauteing them. Add frozen vegetables to soups, stews, pasta, or rice. Add nuts or seeds for added healthy fat at each meal. You can add these to yogurt, salads, or vegetable dishes. Marinate fish or vegetables using olive oil, lemon juice, garlic, and fresh herbs. Meal planning  Plan to eat 1 vegetarian meal one day each week. Try to work up to 2 vegetarian meals, if possible. Eat seafood 2 or more times a week. Have healthy snacks readily available, such as: Vegetable sticks with hummus. Greek yogurt. Fruit and nut trail mix. Eat balanced meals throughout the week. This includes: Fruit: 2-3 servings a day Vegetables: 4-5 servings a day Low-fat dairy: 2 servings a day Fish, poultry, or lean meat: 1 serving a day Beans and  legumes: 2 or more servings a week Nuts and seeds: 1-2 servings a day Whole grains: 6-8 servings a day Extra-virgin olive oil: 3-4 servings a day Limit red meat and sweets to only a few servings a month What are my food choices? Mediterranean diet Recommended Grains: Whole-grain pasta. Brown rice. Bulgar wheat. Polenta. Couscous. Whole-wheat bread. [place] Madeira. Vegetables: Artichokes. Beets. Broccoli. Cabbage. Carrots. Eggplant. Green beans. Chard. Kale. Spinach. Onions. Leeks. Peas. Squash. Tomatoes. Peppers. Radishes. Fruits: Apples. Apricots. Avocado. Berries. Bananas. Cherries. Dates. Figs. Grapes. Lemons. Melon. Oranges. Peaches. Plums. Pomegranate. Meats and other protein foods: Beans. Almonds. Sunflower seeds. Pine nuts. Peanuts. Cod. Salmon. Scallops. Shrimp. Tuna. Tilapia. Clams. Oysters. Eggs. Dairy: Low-fat milk. Cheese. Greek yogurt. Beverages: Water . Red wine. Herbal tea. Fats and oils: Extra virgin olive oil. Avocado oil. Grape seed oil. Sweets and desserts: Greek yogurt with honey. Baked apples. Poached pears. Trail mix. Seasoning and other foods: Basil. Cilantro. Coriander. Cumin. Mint. Parsley. Sage. Rosemary. Tarragon. Garlic. Oregano. Thyme. Pepper. Balsalmic vinegar. Tahini. Hummus. Tomato sauce. Olives. Mushrooms. Limit these Grains: Prepackaged pasta or rice dishes. Prepackaged cereal with added sugar. Vegetables: Deep fried potatoes (french fries). Fruits: Fruit canned in  syrup. Meats and other protein foods: Beef. Pork. Lamb. Poultry with skin. Hot dogs. [place]. Dairy: Ice cream. Sour cream. Whole milk. Beverages: Juice. Sugar-sweetened soft drinks. Beer. Liquor and spirits. Fats and oils: Butter. Canola oil. Vegetable oil. Beef fat (tallow). Lard. Sweets and desserts: Cookies. Cakes. Pies. Candy. Seasoning and other foods: Mayonnaise. Premade sauces and marinades. The items listed may not be a complete list. Talk with your dietitian about what dietary choices  are right for you. Summary The Mediterranean diet includes both food and lifestyle choices. Eat a variety of fresh fruits and vegetables, beans, nuts, seeds, and whole grains. Limit the amount of red meat and sweets that you eat. Talk with your health care provider about whether it is safe for you to drink red wine in moderation. This means 1 glass a day for nonpregnant women and 2 glasses a day for men. A glass of wine equals 5 oz (150 mL). This information is not intended to replace advice given to you by your health care provider. Make sure you discuss any questions you have with your health care provider. Document Released: 11/07/2015 Document Revised: 12/10/2015 Document Reviewed: 11/07/2015 Elsevier Interactive Patient Education  2017 [employer].

## 2023-05-07 NOTE — Progress Notes (Signed)
 Assessment/Plan:     Bradley Oconnor is a very pleasant 83 y.o. year old RH male with a history of hypertension, hyperlipidemia, prolonged QT interval, GERD, CAD status post MI, hypothyroidism recent COVID requiring hospitalization after syncope and collapse due to volume depletion, situational,depression with possible PTSD, seen today for evaluation of memory loss. MoCA today is 17/30.  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  Most recent MRI of the brain in September 2024 was remarkable for some atrophy, remote left occipital lobe stroke with additional small remote infarcts in bilateral basal ganglia and left cerebellar hemisphere, as well as chronic small vessel changes.  In addition, it is noted that the patient is undergoing situational depression, after the death of his wife in 10-27-2022.  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  We discussed the role of psychotherapy, he states that the Bradley Oconnor has lost all his records, for which we cannot refer him there, will place one at [facility] behavioral health.  Discussed initiating antidementia medication, and in view of his prior history of QT prolongation, ACHI will not be indicated, will start memantine  twice daily.  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   Continue to control mood as per PCP, recommend that he start on an antidepressant Fol[place]ow up in 3 months  Subjective:    The patient is accompanied by his caregiver and his brother  who supplement  the history.    How long did patient have memory difficulties?  For about 6 months .  He states that the death of his wife affected him deeply.  Patient reports some difficulty remembering new information, recent conversations, names.LTM is excellent according to his brother repeats oneself? Denies  Disoriented when walking into a room?  Patient denies    Leaving objects in unusual places?  Denies    Wandering behavior? denies   Any personality changes, or depression, anxiety?  He has anxiety, upset at times, agitated intermittently, family reports depression.  Patient agrees.  He Lost his wife in October 27, 2022, and for a while he was unable to begin the grieving process, I am just beginning to do so .  In addition, the patient reports survivor's guilt from battle in [place] lost 83 of his men, he is very tearful when describing it.  He never sought psychotherapy I had to move on and go to work , and now is beginning to catch up of me . Hallucinations or paranoia? Denies.   Seizures? denies    Any sleep changes?  Sleeps well. Denies vivid dreams, REM behavior or sleepwalking.  He is taking mirtazapine  Sleep apnea? Denies.   Any hygiene concerns?  Denies.   Independent of bathing and dressing?  He needs assistance with bathing and dressing due to decreased mobility. Does the patient need help with medications?  Caregiver is in charge   Who is in charge of the finances?  Patient, brother is in charge     Any changes in appetite?  Decreased, has recently been on mirtazapine  having gained weight from 121 to 141 pounds but then he got COVID in January of this year, has dropped a few pounds from 141-136.  He is gaining slowly his weight  Patient have trouble  swallowing?  Denies.   Does the patient cook? No  Any headaches?  Denies.   Chronic pain? Denies.   Ambulates with difficulty? Uses a walker an cane, does PT and OT.   Recent falls or head injuries? Denies.     Vision changes?  Denies any new issues.  Any strokelike symptoms?  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  He denies any other symptoms or signs. Any tremors? Denies. . Any anosmia? Denies.   Any incontinence of urine?  Endorsed, takes Ditropan . Any bowel dysfunction?  History of intermittent constipation. Patient lives with Bradley Oconnor his caregiver    History of heavy alcohol intake? Denies.   History of heavy tobacco use? Denies.   Family history of dementia?  Mother with dementia?type Does patient drive? Was until recently, he wants to go back to it I want to drive my Corvette .Bradley Oconnor  Retired Counselling Psychologist business, retired in 2020  Most recent MRI of the brain, personally reviewed September 2024 shows parenchymal volume loss with prominence of the ventricular system and extra-axial CSF spaces, remote infarct is seen in the left occipital lobe, with additional small remote infarcts in bilateral basal ganglia and left cerebellar hemisphere, chronic small vessel ischemic changes.  Atrophy noted.  No acute findings  Allergies  Allergen Reactions   Procardia [Nifedipine] Other (See Comments)    Hypotension    Phenergan [Promethazine Hcl] Nausea And Vomiting    Current Outpatient Medications  Medication Instructions   acetaminophen  (TYLENOL ) 650 mg, Every 6 hours PRN   apixaban  (ELIQUIS ) 5 mg, Oral, 2 times daily   cyanocobalamin  (VITAMIN B12) 1,000 mcg, Oral, Daily   folic acid  (FOLVITE ) 1 mg, Oral, Daily   magnesium  chloride (SLOW-MAG) 64 MG TBEC SR tablet 64 mg, Oral, Daily   memantine  (NAMENDA ) 5 MG tablet Take 1 tablet (5 mg at night) for 2 weeks, then increase to 1 tablet (5 mg) twice a day   mirtazapine   (REMERON  SOL-TAB) 15 mg, Oral, Daily   oxybutynin  (DITROPAN ) 5 mg, Oral, Every 8 hours PRN   pantoprazole  (PROTONIX ) 40 mg, Oral, Daily   potassium chloride  SA (KLOR-CON  M) 20 MEQ tablet 20 mEq, Oral, Daily   psyllium (METAMUCIL) 58.6 % packet 1 packet, Daily   senna (SENOKOT) 8.6 mg, Oral, Daily   torsemide  (DEMADEX ) 10-20 mg, Oral, Daily     VITALS:   Vitals:   05/07/23 0945  BP: 103/64  Pulse: 79  Resp: 18  SpO2: 100%  Height: 5' 7 (1.702 m)      PHYSICAL EXAM   HEENT:  Normocephalic, atraumatic.  The superficial temporal arteries are without ropiness or tenderness. Cardiovascular: Regular rate and rhythm. Lungs: Clear to auscultation bilaterally. Neck: There are no carotid bruits noted bilaterally.  NEUROLOGICAL:    05/07/2023   11:00 AM 03/26/2023   12:08 PM  Montreal Cognitive Assessment   Visuospatial/ Executive (0/5) 2 1  Naming (0/3) 3 2  Attention: Read list of digits (0/2) 1 0  Attention: Read list of letters (  0/1) 1 1  Attention: Serial 7 subtraction starting at 100 (0/3) 3 3  Language: Repeat phrase (0/2) 2 1  Language : Fluency (0/1) 0 0  Abstraction (0/2) 1 2  Delayed Recall (0/5) 0 0  Orientation (0/6) 4 6  Total 17 16  Adjusted Score (based on education) 17 17    r     No data to display           Orientation:  Alert and oriented to person, place and not to time. No aphasia or dysarthria. Fund of knowledge is appropriate. Recent memory impaired, remote memory normal.  Attention and concentration are reduced.  Able to name objects and repeat phrases, delayed recall 0/5 Cranial nerves: There is good facial symmetry. Extraocular muscles are intact and visual fields are full to confrontational testing. Speech is fluent and clear. No tongue deviation. Hearing is intact to conversational tone. Tone: Tone is good throughout. Sensation: Sensation is intact to light touch.  Vibration is intact at the bilateral big toe.  Coordination: The patient has  no difficulty with RAM's or FNF bilaterally. Normal finger to nose  Motor: Strength is 5/5 in the left upper and left lower extremities.  On the right 4/5.  There is no pronator drift. There are no fasciculations noted.   DTR's: Deep tendon reflexes are 2/4 bilaterally. Gait and Station: The patient is able to ambulate but with some difficulty, needs assistance, significant deconditioning noted. Gait is cautious and narrow.  Mild ataxia seen.  Stride length is normal        Thank you for allowing us  the opportunity to participate in the care of this nice patient. Please do not hesitate to contact us  for any questions or concerns.   Total time spent on today's visit was 60 minutes dedicated to this patient today, preparing to see patient, examining the patient, ordering tests and/or medications and counseling the patient, documenting clinical information in the EHR or other health record, independently interpreting results and communicating results to the patient/family, discussing treatment and goals, answering patient's questions and coordinating care.  Cc:  Levora Reyes SAUNDERS, MD  Camie Sevin 05/07/2023 11:52 AM

## 2023-05-08 LAB — TSH: TSH: 1.36 m[IU]/L (ref 0.40–4.50)

## 2023-05-08 LAB — VITAMIN B12: Vitamin B-12: 1133 pg/mL — ABNORMAL HIGH (ref 200–1100)

## 2023-05-09 ENCOUNTER — Encounter: Payer: Self-pay | Admitting: Family Medicine

## 2023-05-10 ENCOUNTER — Telehealth: Payer: Self-pay | Admitting: Physician Assistant

## 2023-05-10 NOTE — Telephone Encounter (Signed)
 Pt brother called and wants to get the results of the lab

## 2023-05-10 NOTE — Telephone Encounter (Signed)
 Jullie Oiler called to get results. She said someone called and left her a message to call back

## 2023-05-10 NOTE — Telephone Encounter (Signed)
 Jullie Oiler called back in returning Renee's call about results. I let her know she is not on the DPR, she says to call the pt's brother Marney Sinning who is on the [place] at [phone]

## 2023-05-10 NOTE — Telephone Encounter (Signed)
 I advised of labs, voiced understanding

## 2023-05-11 ENCOUNTER — Telehealth: Payer: Self-pay | Admitting: Family Medicine

## 2023-05-11 NOTE — Telephone Encounter (Signed)
Called [name] notes patient has still been constipated, had been advised to take Miralax with Colace daily to help he had one BM Friday but not since then, [name] is going to try and give him some Magnesium Citrate and see if this helps. Will call back if still no movement.

## 2023-05-11 NOTE — Telephone Encounter (Signed)
Copied from CRM [id number]. Topic: General - Other >> May 11, 2023  1:07 PM Deaijah H wrote: Reason for CRM: Patients care giver Blanchard Kelch called in wanting Dr. Paralee Cancel nurse to give a call regarding patients health / please call  [phone]

## 2023-05-14 ENCOUNTER — Ambulatory Visit: Payer: PPO | Admitting: Family Medicine

## 2023-05-21 ENCOUNTER — Other Ambulatory Visit: Payer: Self-pay | Admitting: Family Medicine

## 2023-05-21 NOTE — Telephone Encounter (Signed)
Copied from CRM [id number]. Topic: Clinical - Medication Refill >> May 21, 2023 10:54 AM [place] J wrote: Most Recent Primary Care Visit:  Provider: [name]  Department: [facility]  Visit Type: HOSPITAL FOLLOW UP  Date: 05/06/2023  Medication: pantoprazole (PROTONIX) 40 MG tablet  Has the patient contacted their pharmacy? Yes (Agent: If no, request that the patient contact the pharmacy for the refill. If patient does not wish to contact the pharmacy document the reason why and proceed with request.) (Agent: If yes, when and what did the pharmacy advise?)  Is this the correct pharmacy for this prescription? Yes If no, delete pharmacy and type the correct one.  This is the patient's preferred pharmacy:  [facility] [address] [place] - [address] [place] [address] Phone: [phone] Fax: [phone]   Has the prescription been filled recently? No  Is the patient out of the medication? Yes  Has the patient been seen for an appointment in the last year OR does the patient have an upcoming appointment? Yes  Can we respond through MyChart? Yes  Agent: Please be advised that Rx refills may take up to 3 business days. We ask that you follow-up with your pharmacy.

## 2023-05-24 MED ORDER — PANTOPRAZOLE SODIUM 40 MG PO TBEC: 40.0000 mg | DELAYED_RELEASE_TABLET | Freq: Every day | ORAL | 0 refills | Status: DC

## 2023-05-26 ENCOUNTER — Other Ambulatory Visit: Payer: Self-pay | Admitting: Family Medicine

## 2023-05-26 MED ORDER — APIXABAN 5 MG PO TABS: 5.0000 mg | ORAL_TABLET | Freq: Two times a day (BID) | ORAL | 0 refills | Status: DC

## 2023-05-26 MED ORDER — TORSEMIDE 20 MG PO TABS
10.0000 mg | ORAL_TABLET | Freq: Every day | ORAL | 0 refills | Status: DC
Start: 2023-05-26 — End: 2023-08-04

## 2023-05-26 NOTE — Telephone Encounter (Signed)
 Copied from CRM [id number]. Topic: Clinical - Medication Refill >> May 26, 2023  1:11 PM Isabell A wrote: Most Recent Primary Care Visit:  Provider: Meredith Staggers R  Department: [facility]  Visit Type: HOSPITAL FOLLOW UP  Date: 05/06/2023  Medication: torsemide (DEMADEX) 20 MG tablet apixaban (ELIQUIS) 5 MG TABS tablet  Has the patient contacted their pharmacy? Yes (Agent: If no, request that the patient contact the pharmacy for the refill. If patient does not wish to contact the pharmacy document the reason why and proceed with request.) (Agent: If yes, when and what did the pharmacy advise?)  Is this the correct pharmacy for this prescription? Yes If no, delete pharmacy and type the correct one.  This is the patient's preferred pharmacy:  [facility] [address] [place] - [address] [place] [address] Phone: [phone] Fax: [phone]   Has the prescription been filled recently? Yes  Is the patient out of the medication? No  Has the patient been seen for an appointment in the last year OR does the patient have an upcoming appointment? Yes  Can we respond through MyChart? No  Agent: Please be advised that Rx refills may take up to 3 business days. We ask that you follow-up with your pharmacy.

## 2023-06-01 ENCOUNTER — Other Ambulatory Visit: Payer: Self-pay | Admitting: Family Medicine

## 2023-06-01 MED ORDER — MAGNESIUM CHLORIDE 64 MG PO TBEC
1.0000 | DELAYED_RELEASE_TABLET | Freq: Every day | ORAL | 1 refills | Status: AC
Start: 2023-06-01 — End: ?

## 2023-06-01 NOTE — Telephone Encounter (Signed)
 Copied from CRM [id number]. Topic: Clinical - Medication Refill >> Jun 01, 2023 11:25 AM Florestine Avers wrote: Most Recent Primary Care Visit:  Provider: [name]  Department: [facility]  Visit Type: HOSPITAL FOLLOW UP  Date: 05/06/2023  Medication: magnesium chloride (SLOW-MAG) 64 MG TBEC SR tablet   Has the patient contacted their pharmacy? Yes (Agent: If no, request that the patient contact the pharmacy for the refill. If patient does not wish to contact the pharmacy document the reason why and proceed with request.) (Agent: If yes, when and what did the pharmacy advise?)  Is this the correct pharmacy for this prescription? Yes If no, delete pharmacy and type the correct one.  This is the patient's preferred pharmacy:  [facility] [address] [place] - [address] [place] [address] Phone: [phone] Fax: [phone]   Has the prescription been filled recently? Yes  Is the patient out of the medication? Yes  Has the patient been seen for an appointment in the last year OR does the patient have an upcoming appointment? Yes  Can we respond through MyChart? Yes  Agent: Please be advised that Rx refills may take up to 3 business days. We ask that you follow-up with your pharmacy.

## 2023-06-01 NOTE — Telephone Encounter (Signed)
 Last Fill: 03/18/23  Last OV: 05/06/23 Next OV: 06/11/23   Routing to provider for review/authorization.

## 2023-06-11 ENCOUNTER — Ambulatory Visit: Payer: PPO | Admitting: Family Medicine

## 2023-06-11 VITALS — BP 124/70 | HR 74 | Temp 98.0°F | Ht 67.0 in | Wt 136.0 lb

## 2023-06-11 DIAGNOSIS — I509 Heart failure, unspecified: Secondary | ICD-10-CM | POA: Insufficient documentation

## 2023-06-11 DIAGNOSIS — J189 Pneumonia, unspecified organism: Secondary | ICD-10-CM | POA: Insufficient documentation

## 2023-06-11 DIAGNOSIS — K5900 Constipation, unspecified: Secondary | ICD-10-CM | POA: Diagnosis not present

## 2023-06-11 DIAGNOSIS — I10 Essential (primary) hypertension: Secondary | ICD-10-CM | POA: Insufficient documentation

## 2023-06-11 DIAGNOSIS — I25709 Atherosclerosis of coronary artery bypass graft(s), unspecified, with unspecified angina pectoris: Secondary | ICD-10-CM

## 2023-06-11 DIAGNOSIS — L98419 Non-pressure chronic ulcer of buttock with unspecified severity: Secondary | ICD-10-CM | POA: Insufficient documentation

## 2023-06-11 DIAGNOSIS — M6281 Muscle weakness (generalized): Secondary | ICD-10-CM | POA: Insufficient documentation

## 2023-06-11 DIAGNOSIS — R7989 Other specified abnormal findings of blood chemistry: Secondary | ICD-10-CM | POA: Diagnosis not present

## 2023-06-11 DIAGNOSIS — G5600 Carpal tunnel syndrome, unspecified upper limb: Secondary | ICD-10-CM | POA: Insufficient documentation

## 2023-06-11 DIAGNOSIS — Z8673 Personal history of transient ischemic attack (TIA), and cerebral infarction without residual deficits: Secondary | ICD-10-CM

## 2023-06-11 DIAGNOSIS — G4700 Insomnia, unspecified: Secondary | ICD-10-CM | POA: Diagnosis not present

## 2023-06-11 DIAGNOSIS — F4320 Adjustment disorder, unspecified: Secondary | ICD-10-CM | POA: Diagnosis not present

## 2023-06-11 DIAGNOSIS — R413 Other amnesia: Secondary | ICD-10-CM

## 2023-06-11 DIAGNOSIS — N393 Stress incontinence (female) (male): Secondary | ICD-10-CM | POA: Insufficient documentation

## 2023-06-11 DIAGNOSIS — E785 Hyperlipidemia, unspecified: Secondary | ICD-10-CM | POA: Insufficient documentation

## 2023-06-11 DIAGNOSIS — I2510 Atherosclerotic heart disease of native coronary artery without angina pectoris: Secondary | ICD-10-CM | POA: Insufficient documentation

## 2023-06-11 LAB — COMPREHENSIVE METABOLIC PANEL
ALT: 18 U/L (ref 0–53)
AST: 19 U/L (ref 0–37)
Albumin: 4 g/dL (ref 3.5–5.2)
Alkaline Phosphatase: 88 U/L (ref 39–117)
BUN: 22 mg/dL (ref 6–23)
CO2: 29 meq/L (ref 19–32)
Calcium: 10.2 mg/dL (ref 8.4–10.5)
Chloride: 100 meq/L (ref 96–112)
Creatinine, Ser: 1.1 mg/dL (ref 0.40–1.50)
GFR: 61.94 mL/min (ref >60.00)
Glucose, Bld: 92 mg/dL (ref 70–99)
Potassium: 4.1 meq/L (ref 3.5–5.1)
Sodium: 138 meq/L (ref 135–145)
Total Bilirubin: 0.6 mg/dL (ref 0.2–1.2)
Total Protein: 7.8 g/dL (ref 6.0–8.3)

## 2023-06-11 LAB — LIPID PANEL
Cholesterol: 311 mg/dL — ABNORMAL HIGH (ref 0–200)
HDL: 42 mg/dL (ref >39.00)
LDL Cholesterol: 212 mg/dL — ABNORMAL HIGH (ref 0–99)
NonHDL: 269.48
Total CHOL/HDL Ratio: 7
Triglycerides: 286 mg/dL — ABNORMAL HIGH (ref 0.0–149.0)
VLDL: 57.2 mg/dL — ABNORMAL HIGH (ref 0.0–40.0)

## 2023-06-11 MED ORDER — NITROGLYCERIN 0.4 MG SL SUBL: 0.4000 mg | SUBLINGUAL_TABLET | SUBLINGUAL | 1 refills | Status: AC | PRN

## 2023-06-11 NOTE — Patient Instructions (Addendum)
 Glad to hear that you are doing better.  Sleepytime tea, melatonin if needed, but return to nighttime dosing of mirtazapine. We have option of increasing dose for depression, but let's see how meeting with behavioral health does. Let me know if you would like to increase mirtazapine prior to 6 week follow up.   Restart rosuvastatin for cholesterol.  Nitroglycerin was refilled if needed, but discussed that with cardiology.  If any concerns on labs I will let you know.  Take care!   Insomnia Insomnia is a sleep disorder that makes it difficult to fall asleep or stay asleep. Insomnia can cause fatigue, low energy, difficulty concentrating, mood swings, and poor performance at work or school. There are three different ways to classify insomnia: Difficulty falling asleep. Difficulty staying asleep. Waking up too early in the morning. Any type of insomnia can be long-term (chronic) or short-term (acute). Both are common. Short-term insomnia usually lasts for 3 months or less. Chronic insomnia occurs at least three times a week for longer than 3 months. What are the causes? Insomnia may be caused by another condition, situation, or substance, such as: Having certain mental health conditions, such as anxiety and depression. Using caffeine, alcohol, tobacco, or drugs. Having gastrointestinal conditions, such as gastroesophageal reflux disease (GERD). Having certain medical conditions. These include: Asthma. Alzheimer's disease. Stroke. Chronic pain. An overactive thyroid gland (hyperthyroidism). Other sleep disorders, such as restless legs syndrome and sleep apnea. Menopause. Sometimes, the cause of insomnia may not be known. What increases the risk? Risk factors for insomnia include: Gender. Females are affected more often than males. Age. Insomnia is more common as people get older. Stress and certain medical and mental health conditions. Lack of exercise. Having an irregular work schedule.  This may include working night shifts and traveling between different time zones. What are the signs or symptoms? If you have insomnia, the main symptom is having trouble falling asleep or having trouble staying asleep. This may lead to other symptoms, such as: Feeling tired or having low energy. Feeling nervous about going to sleep. Not feeling rested in the morning. Having trouble concentrating. Feeling irritable, anxious, or depressed. How is this diagnosed? This condition may be diagnosed based on: Your symptoms and medical history. Your health care provider may ask about: Your sleep habits. Any medical conditions you have. Your mental health. A physical exam. How is this treated? Treatment for insomnia depends on the cause. Treatment may focus on treating an underlying condition that is causing the insomnia. Treatment may also include: Medicines to help you sleep. Counseling or therapy. Lifestyle adjustments to help you sleep better. Follow these instructions at home: Eating and drinking  Limit or avoid alcohol, caffeinated beverages, and products that contain nicotine and tobacco, especially close to bedtime. These can disrupt your sleep. Do not eat a large meal or eat spicy foods right before bedtime. This can lead to digestive discomfort that can make it hard for you to sleep. Sleep habits  Keep a sleep diary to help you and your health care provider figure out what could be causing your insomnia. Write down: When you sleep. When you wake up during the night. How well you sleep and how rested you feel the next day. Any side effects of medicines you are taking. What you eat and drink. Make your bedroom a dark, comfortable place where it is easy to fall asleep. Put up shades or blackout curtains to block light from outside. Use a white noise machine to block  noise. Keep the temperature cool. Limit screen use before bedtime. This includes: Not watching TV. Not using your  smartphone, tablet, or computer. Stick to a routine that includes going to bed and waking up at the same times every day and night. This can help you fall asleep faster. Consider making a quiet activity, such as reading, part of your nighttime routine. Try to avoid taking naps during the day so that you sleep better at night. Get out of bed if you are still awake after 15 minutes of trying to sleep. Keep the lights down, but try reading or doing a quiet activity. When you feel sleepy, go back to bed. General instructions Take over-the-counter and prescription medicines only as told by your health care provider. Exercise regularly as told by your health care provider. However, avoid exercising in the hours right before bedtime. Use relaxation techniques to manage stress. Ask your health care provider to suggest some techniques that may work well for you. These may include: Breathing exercises. Routines to release muscle tension. Visualizing peaceful scenes. Make sure that you drive carefully. Do not drive if you feel very sleepy. Keep all follow-up visits. This is important. Contact a health care provider if: You are tired throughout the day. You have trouble in your daily routine due to sleepiness. You continue to have sleep problems, or your sleep problems get worse. Get help right away if: You have thoughts about hurting yourself or someone else. Get help right away if you feel like you may hurt yourself or others, or have thoughts about taking your own life. Go to your nearest emergency room or: Call 911. Call the National Suicide Prevention Lifeline at 7432407607 or 988. This is open 24 hours a day. Text the Crisis Text Line at 201 081 4148. Summary Insomnia is a sleep disorder that makes it difficult to fall asleep or stay asleep. Insomnia can be long-term (chronic) or short-term (acute). Treatment for insomnia depends on the cause. Treatment may focus on treating an underlying condition  that is causing the insomnia. Keep a sleep diary to help you and your health care provider figure out what could be causing your insomnia. This information is not intended to replace advice given to you by your health care provider. Make sure you discuss any questions you have with your health care provider. Document Revised: 02/24/2021 Document Reviewed: 02/24/2021 Elsevier Patient Education  2024 [employer].

## 2023-06-11 NOTE — Progress Notes (Signed)
 Subjective:  Patient ID: Bradley Oconnor, male    DOB: [birth year]  Age: 83 y.o. MRN: [id number]  CC:  Chief Complaint  Patient presents with   Medical Management of Chronic Issues    Bradley Oconnor had questions about atorvastatin, she would also like to ensure it is okay to give patient OTC magnesium or does this have to be a prescription   Pt feels well no concerns from him    HPI Bradley Oconnor presents for  Follow-up from February 6 visit.  Multiple concerns addressed at that time.  Here with caregiver Bradley Oconnor again today and brother.   Constipation Noted last visit, prior diarrhea.  Some decreased appetite.  Decreased appetite thought to be from prior COVID infection, appetite was doing well prior to infection.  He is on mirtazapine 15 mg daily already.  MiraLAX discussed for constipation.  Decreased appetite also possibly related to depression, dementia.  See below on neurovisit.  He was continued on same dose of Remeron. Constipation resolved, BM 2-3 days, soft stools, no diarrhea, no straining.  Appetite up and down, better recently. Has been cutting back - eating too much.   Hyperlipidemia: With history of CHF, treated with Crestor, one half of 20 mg previously for 10 mg dose.  Due for updated lipid panel. Some misunderstanding - not on med list, has not been taking but has rx. - will start.  Not fasting today - had banana this am.  Lab Results  Component Value Date   CHOL 132 03/03/2021   HDL 42.50 03/03/2021   LDLCALC 65 03/03/2021   TRIG 124.0 03/03/2021   CHOLHDL 3 03/03/2021   Lab Results  Component Value Date   ALT 22 04/24/2023   AST 28 04/24/2023   ALKPHOS 86 04/24/2023   BILITOT 1.0 04/24/2023   Elevated creatinine Noted on 05/09/2023, higher than previous reading but similar to readings in the past.  Up from 1.08-1.35 from January 25, previously 1.31, 1.11 in December and earlier in January.   Also with history of hypomagnesemia but was stable at 2.1 on  current regimen on his February 9 labs.  Increased fluid intake recommended which may also be helpful with constipation.  Borderline thrombocytosis with rest of CBC reassuring last visit. Otc magnesium BID. No recent changes.  Drinking fluids, water. Improved.  Lab Results  Component Value Date   CREATININE 1.35 05/06/2023   Lab Results  Component Value Date   WBC 10.4 05/06/2023   HGB 14.1 05/06/2023   HCT 42.6 05/06/2023   MCV 93.7 05/06/2023   PLT 406.0 (H) 05/06/2023    Memory impairment Concerns regarding memory, mood were discussed previous visits.  He did have a visit with neurology, Marlowe Kays on February 7.Note reviewed.  MRI of brain September 2024 with some atrophy, remote left occipital lobe stroke with additional small remote infarcts in the basal ganglia and left cerebellar hemisphere, chronic small vessel changes.  Situational depression after death of his wife in Jul 27, 2024and suspected PTSD from participating in [place].  Goal of psychotherapy was discussed.  Referred to [place] [facility].  In light of previous QT prolongation, acetylcholinesterase inhibitors will not be indicated, but was started on memantine twice daily.  Option of antidepressants from me taking in account of QT prolongation.  Neurocognitive testing planned.  B12 and TSH obtained, memantine 5 mg twice daily started.  79-month follow-up planned. QTC 489 on 04/21/23.  Has not met with behavioral health - waiting on call back.  He reports better, mood and kimberly notes improvement.  Occasional trouble getting to sleep. No treatments. Had tried daytime dosing of mirtazapine - no change in mood.   Has NTG if needed for chest pain. Not experiencing chest pain, just old Rx. Appt with cardiology on 06/29/23. Will discuss there as well.   Walking with cane in house, to mailbox.   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   Congestive heart failure (HCC) 06/11/2023   Muscle weakness 06/11/2023   Pneumonia 06/11/2023   Coronary arteriosclerosis 06/11/2023   Essential hypertension 06/11/2023   Hyperlipidemia 06/11/2023   Primary stress urinary incontinence 06/11/2023   Cholelithiasis 01/26/2023   Intractable lower abdominal pain 01/26/2023   Prolonged QT interval 12/21/2022   Malnutrition of moderate degree 12/19/2022   Persistent atrial fibrillation (HCC) 12/15/2022   Chronic combined systolic and diastolic CHF (congestive heart failure) (HCC) 12/15/2022   Pressure injury of skin 12/15/2022   Low back pain 08/07/2020   Trigger finger of right thumb 04/17/2020   Carpal tunnel syndrome, bilateral 09/26/2019   Shoulder pain, bilateral 08/03/2019   Cervicalgia 03/28/2018   GERD (gastroesophageal reflux disease) 04/10/2013   HTN (hypertension) 04/10/2013   CAD (coronary artery disease) of artery bypass graft 10/04/2012   Hypothyroidism 10/04/2012   Hyperlipidemia with target LDL less than 70 10/04/2012   Kidney stones 01/27/2012   Hx of appendectomy-history of ruptured requiring ileocecectomy (~1994) 01/27/2012   Past Medical History:  Diagnosis Date   Blood transfusion without reported diagnosis    CAD (coronary artery disease)    GERD (gastroesophageal reflux disease)    Heart attack (HCC)    Hyperlipidemia    Hypertension 03/07/2012   ECHO-WNL     08/12/11 Lexiscan MyoviewNo significant ischemia demonstrated Low risk scan There is a moderate sized dense scar in the LCX territoy unchanged from the prior study.. Post- stress EF is 40%.   Septic shock (HCC) 12/10/2022   Thyroid disease    Past Surgical History:  Procedure Laterality Date   APPENDECTOMY     CERVICAL SPINE SURGERY     titanium plate in the back of neck   CORONARY ARTERY BYPASS GRAFT     HERNIA REPAIR     SMALL INTESTINE SURGERY     THYROID SURGERY     1/2 thyroid removed on right side   Allergies  Allergen  Reactions   Procardia [Nifedipine] Other (See Comments)    Hypotension    Phenergan [Promethazine Hcl] Nausea And Vomiting   Prior to Admission medications   Medication Sig Start Date End Date Taking? Authorizing Provider  acetaminophen (TYLENOL) 325 MG tablet Take 650 mg by mouth every 6 (six) hours as needed.   Yes [provider]  apixaban (ELIQUIS) 5 MG TABS tablet Take 1 tablet (5 mg total) by mouth 2 (two) times daily. 05/26/23  Yes [name], MD  cyanocobalamin (VITAMIN B12) 1000 MCG tablet Take 1 tablet (1,000 mcg total) by mouth daily. 03/18/23  Yes [name], MD  folic acid (FOLVITE) 1 MG tablet Take 1 tablet (1 mg total) by mouth daily. 03/01/23  Yes [name], MD  magnesium chloride (SLOW-MAG) 64 MG TBEC SR tablet Take 1 tablet (64 mg total) by mouth daily. 06/01/23  Yes [name], MD  memantine (NAMENDA) 5 MG tablet Take 1 tablet (5 mg at night) for 2 weeks, then increase  to 1 tablet (5 mg) twice a day 05/07/23  Yes Wertman, Sung Amabile, PA-C  mirtazapine (REMERON SOL-TAB) 15 MG disintegrating tablet Take 1 tablet (15 mg total) by mouth daily. 03/26/23  Yes [name], MD  oxybutynin (DITROPAN) 5 MG tablet Take 1 tablet (5 mg total) by mouth every 8 (eight) hours as needed for bladder spasms. 01/30/23  Yes Burnadette Pop, MD  pantoprazole (PROTONIX) 40 MG tablet Take 1 tablet (40 mg total) by mouth daily. 05/24/23  Yes [name], MD  potassium chloride SA (KLOR-CON M) 20 MEQ tablet Take 1 tablet (20 mEq total) by mouth daily. 04/29/23  Yes [name], MD  psyllium (METAMUCIL) 58.6 % packet Take 1 packet by mouth daily.   Yes [provider]  rosuvastatin (CRESTOR) 20 MG tablet Take 10 mg by mouth daily. 04/27/23  Yes [provider]  senna (SENOKOT) 8.6 MG TABS tablet Take 1 tablet (8.6 mg total) by mouth daily. 01/30/23  Yes Burnadette Pop, MD  torsemide (DEMADEX) 20 MG tablet Take 0.5-1 tablets (10-20 mg total) by  mouth daily. 05/26/23  Yes [name], MD   Social History   Socioeconomic History   Marital status: Married    Spouse name: Not on file   Number of children: 0   Years of education: 16   Highest education level: Some college, no degree  Occupational History   Occupation: Retired  Tobacco Use   Smoking status: Never   Smokeless tobacco: Never  Vaping Use   Vaping status: Never Used  Substance and Sexual Activity   Alcohol use: No    Alcohol/week: 0.0 standard drinks of alcohol   Drug use: No   Sexual activity: Never  Other Topics Concern   Not on file  Social History Narrative   Married.    One floor home    Around the clock care   Drinks caffeine   Jake Shark is the brother   Social Drivers of Corporate investment banker Strain: Low Risk  (07/16/2022)   Overall Financial Resource Strain (CARDIA)    Difficulty of Paying Living Expenses: Not hard at all  Food Insecurity: No Food Insecurity (01/26/2023)   Hunger Vital Sign    Worried About Running Out of Food in the Last Year: Never true    Ran Out of Food in the Last Year: Never true  Transportation Needs: No Transportation Needs (01/26/2023)   PRAPARE - Administrator, Civil Service (Medical): No    Lack of Transportation (Non-Medical): No  Physical Activity: Insufficiently Active (07/16/2022)   Exercise Vital Sign    Days of Exercise per Week: 3 days    Minutes of Exercise per Session: 30 min  Stress: No Stress Concern Present (07/16/2022)   [employer] of Occupational Health - Occupational Stress Questionnaire    Feeling of Stress : Not at all  Social Connections: Moderately Isolated (07/16/2022)   Social Connection and Isolation Panel [NHANES]    Frequency of Communication with Friends and Family: Three times a week    Frequency of Social Gatherings with Friends and Family: Three times a week    Attends Religious Services: Never    Active Member of Clubs or Organizations: No    Attends Occupational hygienist Meetings: Never    Marital Status: Married  Catering manager Violence: Not At Risk (01/26/2023)   Humiliation, Afraid, Rape, and Kick questionnaire    Fear of Current or Ex-Partner: No  Emotionally Abused: No    Physically Abused: No    Sexually Abused: No    Review of Systems  Per HPI Objective:   Vitals:   06/11/23 0932  BP: 124/70  Pulse: 74  Temp: 98 F (36.7 C)  TempSrc: Temporal  SpO2: 94%  Weight: 136 lb (61.7 kg)  Height: 5\' 7"  (1.702 m)     Physical Exam Vitals reviewed.  Constitutional:      Appearance: He is well-developed.  HENT:     Head: Normocephalic and atraumatic.  Neck:     Vascular: No carotid bruit or JVD.  Cardiovascular:     Rate and Rhythm: Normal rate and regular rhythm.     Heart sounds: Normal heart sounds. No murmur heard. Pulmonary:     Effort: Pulmonary effort is normal.     Breath sounds: Normal breath sounds. No rales.  Abdominal:     General: Abdomen is flat. Bowel sounds are normal. There is no distension.     Tenderness: There is no abdominal tenderness.  Musculoskeletal:     Right lower leg: No edema.     Left lower leg: No edema.  Skin:    General: Skin is warm and dry.  Neurological:     Mental Status: He is alert and oriented to person, place, and time.  Psychiatric:        Mood and Affect: Mood normal.        Assessment & Plan:  JAMIAH RECORE is a 83 y.o. male . Constipation, unspecified constipation type  -Improved, continue bowel regimen with RTC precautions.  Hypomagnesemia  -Stable on last testing with over-the-counter treatment, continue same dosing.  Adjustment disorder, unspecified type Memory impairment  -Tolerating new medication for memory loss/impairment mood overall improved, will continue same dose of mirtazapine for now.  Option of higher dosing, will let me know if symptoms change.  Follow-up with behavioral health as planned.  History of CVA in adulthood - Plan:  Comprehensive metabolic panel, Lipid panel  -Check labs, adjust regimen accordingly.  Should be taking rosuvastatin, clarified with patient, caregiver.  Labs may be indicative of elevated readings off meds.  No changes for now.  Elevated serum creatinine - Plan: Comprehensive metabolic panel  -Check labs, adjust plan accordingly.  Maintain hydration.  Insomnia, unspecified type  -Over-the-counter treatments, sleep hygiene discussed.  Hold on prescription meds for now, especially with recent new medication above.  Coronary artery disease involving coronary bypass graft of native heart with angina pectoris (HCC) - Plan: nitroGLYCERIN (NITROSTAT) 0.4 MG SL tablet  -Keep follow-up with cardiology, expired nitroglycerin at home, refilled but if he does require use of that medication should be seen by medical provider.  Can discuss that use further with cardiology.  Asymptomatic at present.  Meds ordered this encounter  Medications   nitroGLYCERIN (NITROSTAT) 0.4 MG SL tablet    Sig: Place 1 tablet (0.4 mg total) under the tongue every 5 (five) minutes as needed for chest pain. If med is needed, be seen by medical provider immediately.    Dispense:  50 tablet    Refill:  1   Patient Instructions  Glad to hear that you are doing better.  Sleepytime tea, melatonin if needed, but return to nighttime dosing of mirtazapine. We have option of increasing dose for depression, but let's see how meeting with behavioral health does. Let me know if you would like to increase mirtazapine prior to 6 week follow up.   Restart rosuvastatin for cholesterol.  Nitroglycerin was refilled if needed, but discussed that with cardiology.  If any concerns on labs I will let you know.  Take care!   Insomnia Insomnia is a sleep disorder that makes it difficult to fall asleep or stay asleep. Insomnia can cause fatigue, low energy, difficulty concentrating, mood swings, and poor performance at work or school. There are  three different ways to classify insomnia: Difficulty falling asleep. Difficulty staying asleep. Waking up too early in the morning. Any type of insomnia can be long-term (chronic) or short-term (acute). Both are common. Short-term insomnia usually lasts for 3 months or less. Chronic insomnia occurs at least three times a week for longer than 3 months. What are the causes? Insomnia may be caused by another condition, situation, or substance, such as: Having certain mental health conditions, such as anxiety and depression. Using caffeine, alcohol, tobacco, or drugs. Having gastrointestinal conditions, such as gastroesophageal reflux disease (GERD). Having certain medical conditions. These include: Asthma. Alzheimer's disease. Stroke. Chronic pain. An overactive thyroid gland (hyperthyroidism). Other sleep disorders, such as restless legs syndrome and sleep apnea. Menopause. Sometimes, the cause of insomnia may not be known. What increases the risk? Risk factors for insomnia include: Gender. Females are affected more often than males. Age. Insomnia is more common as people get older. Stress and certain medical and mental health conditions. Lack of exercise. Having an irregular work schedule. This may include working night shifts and traveling between different time zones. What are the signs or symptoms? If you have insomnia, the main symptom is having trouble falling asleep or having trouble staying asleep. This may lead to other symptoms, such as: Feeling tired or having low energy. Feeling nervous about going to sleep. Not feeling rested in the morning. Having trouble concentrating. Feeling irritable, anxious, or depressed. How is this diagnosed? This condition may be diagnosed based on: Your symptoms and medical history. Your health care provider may ask about: Your sleep habits. Any medical conditions you have. Your mental health. A physical exam. How is this  treated? Treatment for insomnia depends on the cause. Treatment may focus on treating an underlying condition that is causing the insomnia. Treatment may also include: Medicines to help you sleep. Counseling or therapy. Lifestyle adjustments to help you sleep better. Follow these instructions at home: Eating and drinking  Limit or avoid alcohol, caffeinated beverages, and products that contain nicotine and tobacco, especially close to bedtime. These can disrupt your sleep. Do not eat a large meal or eat spicy foods right before bedtime. This can lead to digestive discomfort that can make it hard for you to sleep. Sleep habits  Keep a sleep diary to help you and your health care provider figure out what could be causing your insomnia. Write down: When you sleep. When you wake up during the night. How well you sleep and how rested you feel the next day. Any side effects of medicines you are taking. What you eat and drink. Make your bedroom a dark, comfortable place where it is easy to fall asleep. Put up shades or blackout curtains to block light from outside. Use a white noise machine to block noise. Keep the temperature cool. Limit screen use before bedtime. This includes: Not watching TV. Not using your smartphone, tablet, or computer. Stick to a routine that includes going to bed and waking up at the same times every day and night. This can help you fall asleep faster. Consider making a quiet activity, such as reading, part of your  nighttime routine. Try to avoid taking naps during the day so that you sleep better at night. Get out of bed if you are still awake after 15 minutes of trying to sleep. Keep the lights down, but try reading or doing a quiet activity. When you feel sleepy, go back to bed. General instructions Take over-the-counter and prescription medicines only as told by your health care provider. Exercise regularly as told by your health care provider. However, avoid  exercising in the hours right before bedtime. Use relaxation techniques to manage stress. Ask your health care provider to suggest some techniques that may work well for you. These may include: Breathing exercises. Routines to release muscle tension. Visualizing peaceful scenes. Make sure that you drive carefully. Do not drive if you feel very sleepy. Keep all follow-up visits. This is important. Contact a health care provider if: You are tired throughout the day. You have trouble in your daily routine due to sleepiness. You continue to have sleep problems, or your sleep problems get worse. Get help right away if: You have thoughts about hurting yourself or someone else. Get help right away if you feel like you may hurt yourself or others, or have thoughts about taking your own life. Go to your nearest emergency room or: Call 911. Call the National Suicide Prevention Lifeline at 828-172-0545 or 988. This is open 24 hours a day. Text the Crisis Text Line at 636-031-0128. Summary Insomnia is a sleep disorder that makes it difficult to fall asleep or stay asleep. Insomnia can be long-term (chronic) or short-term (acute). Treatment for insomnia depends on the cause. Treatment may focus on treating an underlying condition that is causing the insomnia. Keep a sleep diary to help you and your health care provider figure out what could be causing your insomnia. This information is not intended to replace advice given to you by your health care provider. Make sure you discuss any questions you have with your health care provider. Document Revised: 02/24/2021 Document Reviewed: 02/24/2021 Elsevier Patient Education  2024 Elsevier Inc.          Signed,   Meredith Staggers, MD [facility], [facility] 06/11/23 10:35 AM

## 2023-06-13 ENCOUNTER — Encounter: Payer: Self-pay | Admitting: Family Medicine

## 2023-06-14 ENCOUNTER — Telehealth: Payer: Self-pay

## 2023-06-14 ENCOUNTER — Other Ambulatory Visit: Payer: Self-pay | Admitting: Family Medicine

## 2023-06-14 NOTE — Telephone Encounter (Signed)
 Heather called back tot return call. She stated that it is okay to LVM with decision of VO. Please call and advise.

## 2023-06-14 NOTE — Telephone Encounter (Signed)
 Copied from CRM [id number]. Topic: Clinical - Home Health Verbal Orders >> Jun 14, 2023 11:54 AM Lennart Pall wrote: Caller/Agency: [facility] Callback Number: [phone] Service Requested: Physical Therapy Frequency: 4 more visits for balance and endurance Any new concerns about the patient? Yes

## 2023-06-14 NOTE — Telephone Encounter (Signed)
 Yes, okay with verbal orders below.  Thanks

## 2023-06-14 NOTE — Telephone Encounter (Signed)
 Called Heather to relay verbal orders per Dr.Greene, Left VM to return call

## 2023-06-14 NOTE — Telephone Encounter (Signed)
 Copied from CRM [id number]. Topic: Clinical - Medication Refill >> Jun 14, 2023  2:47 PM Elizebeth Brooking wrote: Most Recent Primary Care Visit:  Provider: Meredith Staggers R  Department: [facility]  Visit Type: OFFICE VISIT  Date: 06/11/2023  Medication: cyanocobalamin (VITAMIN B12) 1000 MCG tablet  Has the patient contacted their pharmacy? Yes (Agent: If no, request that the patient contact the pharmacy for the refill. If patient does not wish to contact the pharmacy document the reason why and proceed with request.) (Agent: If yes, when and what did the pharmacy advise?)  Is this the correct pharmacy for this prescription? Yes If no, delete pharmacy and type the correct one.  This is the patient's preferred pharmacy:  [facility] [address] [place] - [address] [place] [address] Phone: [phone] Fax: [phone]   Has the prescription been filled recently? No  Is the patient out of the medication? Yes  Has the patient been seen for an appointment in the last year OR does the patient have an upcoming appointment? Yes  Can we respond through MyChart? Yes  Agent: Please be advised that Rx refills may take up to 3 business days. We ask that you follow-up with your pharmacy.

## 2023-06-14 NOTE — Telephone Encounter (Signed)
 Last Fill: 03/18/23  Last OV: 06/11/23 Next OV: 07/30/23  Routing to provider for review/authorization.

## 2023-06-14 NOTE — Telephone Encounter (Signed)
Verbal orders okay

## 2023-06-15 ENCOUNTER — Encounter: Payer: Self-pay | Admitting: Family Medicine

## 2023-06-15 MED ORDER — VITAMIN B-12 1000 MCG PO TABS: 1000.0000 ug | ORAL_TABLET | Freq: Every day | ORAL | 0 refills | Status: DC

## 2023-06-15 NOTE — Telephone Encounter (Signed)
 Called Heather to relay verbal okay for physical therapy. Heather verbalized understanding.

## 2023-06-21 ENCOUNTER — Other Ambulatory Visit: Payer: Self-pay | Admitting: Family Medicine

## 2023-06-21 MED ORDER — APIXABAN 5 MG PO TABS: 5.0000 mg | ORAL_TABLET | Freq: Two times a day (BID) | ORAL | 0 refills | Status: DC

## 2023-06-21 MED ORDER — PANTOPRAZOLE SODIUM 40 MG PO TBEC: 40.0000 mg | DELAYED_RELEASE_TABLET | Freq: Every day | ORAL | 0 refills | Status: DC

## 2023-06-21 NOTE — Telephone Encounter (Signed)
 Copied from CRM [id number]. Topic: Clinical - Medication Refill >> Jun 21, 2023 12:21 PM Adele Barthel wrote: Most Recent Primary Care Visit:  Provider: Meredith Staggers R  Department: [facility]  Visit Type: OFFICE VISIT  Date: 06/11/2023  Medication: apixaban (ELIQUIS) 5 MG TABS tablet, pantoprazole (PROTONIX) 40 MG tablet  Has the patient contacted their pharmacy? Yes (Agent: If no, request that the patient contact the pharmacy for the refill. If patient does not wish to contact the pharmacy document the reason why and proceed with request.) (Agent: If yes, when and what did the pharmacy advise?)  Is this the correct pharmacy for this prescription? Yes If no, delete pharmacy and type the correct one.  This is the patient's preferred pharmacy:  [facility] [address], [place] - [address] [place] [address] Phone: [phone] Fax: [phone]   Has the prescription been filled recently? Yes  Is the patient out of the medication? No  Has the patient been seen for an appointment in the last year OR does the patient have an upcoming appointment? Yes  Can we respond through MyChart? Yes  Agent: Please be advised that Rx refills may take up to 3 business days. We ask that you follow-up with your pharmacy.

## 2023-06-28 ENCOUNTER — Encounter (HOSPITAL_BASED_OUTPATIENT_CLINIC_OR_DEPARTMENT_OTHER): Payer: Self-pay

## 2023-06-29 ENCOUNTER — Ambulatory Visit: Payer: PPO | Admitting: Cardiovascular Disease

## 2023-07-25 ENCOUNTER — Other Ambulatory Visit: Payer: Self-pay | Admitting: Family Medicine

## 2023-07-25 DIAGNOSIS — F4320 Adjustment disorder, unspecified: Secondary | ICD-10-CM

## 2023-07-26 ENCOUNTER — Other Ambulatory Visit: Payer: Self-pay | Admitting: Family Medicine

## 2023-07-26 DIAGNOSIS — F4320 Adjustment disorder, unspecified: Secondary | ICD-10-CM

## 2023-07-26 NOTE — Telephone Encounter (Signed)
 Copied from CRM [id number]. Topic: Clinical - Medication Refill >> Jul 26, 2023  9:25 AM Freya Jesus wrote: Most Recent Primary Care Visit:  Provider: Caro Christmas R  Department: [facility]  Visit Type: OFFICE VISIT  Date: 06/11/2023  Medication: mirtazapine  (REMERON  SOL-TAB) 15 MG disintegrating tablet [[id number]] pantoprazole  (PROTONIX ) 40 MG tablet [[id number]] potassium chloride  SA (KLOR-CON  M) 20 MEQ tablet [[id number]] apixaban  (ELIQUIS ) 5 MG TABS tablet [[id number]]    Has the patient contacted their pharmacy? Yes (Agent: If no, request that the patient contact the pharmacy for the refill. If patient does not wish to contact the pharmacy document the reason why and proceed with request.) (Agent: If yes, when and what did the pharmacy advise?) Out of refill  Is this the correct pharmacy for this prescription? Yes If no, delete pharmacy and type the correct one.  This is the patient's preferred pharmacy:  [facility] [address] [place] - [address] [place] [address] Phone: [phone] Fax: [phone]   Has the prescription been filled recently? No  Is the patient out of the medication? No  Has the patient been seen for an appointment in the last year OR does the patient have an upcoming appointment? Yes  Can we respond through MyChart? No  Agent: Please be advised that Rx refills may take up to 3 business days. We ask that you follow-up with your pharmacy.

## 2023-07-27 NOTE — Telephone Encounter (Signed)
 Duplicate, signed 07/26/23

## 2023-07-30 ENCOUNTER — Ambulatory Visit (INDEPENDENT_AMBULATORY_CARE_PROVIDER_SITE_OTHER): Admitting: Family Medicine

## 2023-07-30 VITALS — BP 110/62 | HR 83 | Temp 98.0°F | Ht 67.0 in

## 2023-07-30 DIAGNOSIS — Z8673 Personal history of transient ischemic attack (TIA), and cerebral infarction without residual deficits: Secondary | ICD-10-CM

## 2023-07-30 DIAGNOSIS — M25551 Pain in right hip: Secondary | ICD-10-CM

## 2023-07-30 DIAGNOSIS — F4320 Adjustment disorder, unspecified: Secondary | ICD-10-CM | POA: Diagnosis not present

## 2023-07-30 DIAGNOSIS — I25709 Atherosclerosis of coronary artery bypass graft(s), unspecified, with unspecified angina pectoris: Secondary | ICD-10-CM

## 2023-07-30 DIAGNOSIS — M25552 Pain in left hip: Secondary | ICD-10-CM

## 2023-07-30 DIAGNOSIS — R238 Other skin changes: Secondary | ICD-10-CM | POA: Diagnosis not present

## 2023-07-30 DIAGNOSIS — N393 Stress incontinence (female) (male): Secondary | ICD-10-CM | POA: Insufficient documentation

## 2023-07-30 DIAGNOSIS — G4700 Insomnia, unspecified: Secondary | ICD-10-CM | POA: Diagnosis not present

## 2023-07-30 LAB — COMPREHENSIVE METABOLIC PANEL WITH GFR
ALT: 18 U/L (ref 0–53)
AST: 22 U/L (ref 0–37)
Albumin: 4.1 g/dL (ref 3.5–5.2)
Alkaline Phosphatase: 87 U/L (ref 39–117)
BUN: 20 mg/dL (ref 6–23)
CO2: 28 meq/L (ref 19–32)
Calcium: 9.6 mg/dL (ref 8.4–10.5)
Chloride: 102 meq/L (ref 96–112)
Creatinine, Ser: 1.17 mg/dL (ref 0.40–1.50)
GFR: 57.46 mL/min — ABNORMAL LOW (ref >60.00)
Glucose, Bld: 103 mg/dL — ABNORMAL HIGH (ref 70–99)
Potassium: 4.1 meq/L (ref 3.5–5.1)
Sodium: 138 meq/L (ref 135–145)
Total Bilirubin: 0.6 mg/dL (ref 0.2–1.2)
Total Protein: 7.8 g/dL (ref 6.0–8.3)

## 2023-07-30 LAB — LIPID PANEL
Cholesterol: 225 mg/dL — ABNORMAL HIGH (ref 0–200)
HDL: 44.4 mg/dL (ref >39.00)
LDL Cholesterol: 141 mg/dL — ABNORMAL HIGH (ref 0–99)
NonHDL: 180.79
Total CHOL/HDL Ratio: 5
Triglycerides: 201 mg/dL — ABNORMAL HIGH (ref 0.0–149.0)
VLDL: 40.2 mg/dL — ABNORMAL HIGH (ref 0.0–40.0)

## 2023-07-30 MED ORDER — APIXABAN 5 MG PO TABS: 5.0000 mg | ORAL_TABLET | Freq: Two times a day (BID) | ORAL | 5 refills | Status: DC

## 2023-07-30 MED ORDER — POTASSIUM CHLORIDE CRYS ER 20 MEQ PO TBCR: 20.0000 meq | EXTENDED_RELEASE_TABLET | Freq: Every day | ORAL | 1 refills | Status: DC

## 2023-07-30 NOTE — Patient Instructions (Addendum)
 Try tylenol  a few times per day for possible arthritis pain in hips, hands. Some of your soreness may be due to increased activity which is good for your health.  Try to find the right balance with activity and that soreness. Follow up if not improving.   You can try more moisturizing shampoo and conditioner, but follow up if not improving or if there is a rash.   I will check cholesterol levels today on the medication, but keep follow-up with cardiology.  Depending on your readings today we may need to decide on higher dose of cholesterol meds or additional medications.  Again this can be discussed with cardiology as well.  I will let you know more info once I review your labs.  You are up to date on pneumonia vaccine - you had Prevnar 20 in 2022 - repeat in 5 years.  Ok to have shingles vaccine at pharmacy as well as RSV and HEP A and B vaccines.   Please let me know if there are any questions or concerns we were not able to address today.  Otherwise I will see you in 3 months.  Take care!

## 2023-07-30 NOTE — Progress Notes (Unsigned)
 Subjective:  Patient ID: Bradley Oconnor, male    DOB: 10-25-1940  Age: 83 y.o. MRN: [id number]  CC:  Chief Complaint  Patient presents with   Medical Management of Chronic Issues    Pt is walking now but having some soreness in the hips now that he's been walking,    Health Maintenance    Patient was advised by pharmacy to have Shingles, and Twinrix, RSV, and prevnar 20 they would like guidance    Skin Problem    Noted some dryness on the scalp     HPI Bradley Oconnor presents for  Concerns above. Here with Bradley Oconnor and brother.   Hip pain: Walking 1 block per day. Both hips, and fingers sore. Notices with activity. No limitations with activity. Worse in the morning.   Tylenol  at night, and sometimes during day. Not daily.  Has been more active with walking, etc compared to months ago.  Hyperlipidemia: Some misunderstanding regarding med regimen and had been off his rosuvastatin  at his March 14 visit.  This was refilled.  Now taking daily.  Coffee and banana today.  Lab Results  Component Value Date   CHOL 311 (H) 06/11/2023   HDL 42.00 06/11/2023   LDLCALC 212 (H) 06/11/2023   TRIG 286.0 (H) 06/11/2023   CHOLHDL 7 06/11/2023   Lab Results  Component Value Date   ALT 18 06/11/2023   AST 19 06/11/2023   ALKPHOS 88 06/11/2023   BILITOT 0.6 06/11/2023   Insomnia with adjustment disorder, memory impairment Discussed at his March 14 visit.  Tolerating new medication from neurology, continued on same dose of mirtazapine  with option of higher dosing if needed for mood symptoms.  Over-the-counter options discussed for insomnia as well as sleep hygiene but decided against new medication or dosage changes with recent addition of memantine . Prior issue with agitation during the day. Taking mirtazapine  at night now, no daytime agitation with nightttime dosing. Mood doing well.  Sleeping ok at night. Melatonin 3 mg at bedtime has been helpful.   Scalp dryness.  Past few months.  Itchy scalp at times. No rash. Head and shoulders shampoo.   HM: Vaccines recommended as above for pharmacy, pneumonia vaccine in 2022, not yet due. Immunization History  Administered Date(s) Administered   Fluad Quad(high Dose 65+) 03/17/2019, 01/17/2020, 02/27/2021   Fluad Trivalent(High Dose 65+) 01/27/2023   Influenza Split 12/09/2011   Influenza, High Dose Seasonal PF 02/10/2018   Influenza,inj,Quad PF,6+ Mos 02/01/2013, 02/01/2014, 01/17/2015, 12/17/2015, 12/31/2016   PFIZER(Purple Top)SARS-COV-2 Vaccination 06/13/2019, 07/04/2019, 03/03/2021   PNEUMOCOCCAL CONJUGATE-20 02/27/2021   Pneumococcal Polysaccharide-23 04/17/2014   Tdap 06/09/2019   Needs eliquis  and potassium refill no new CP or heart palpitations.  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   Congestive heart failure (HCC) 06/11/2023   Muscle weakness 06/11/2023   Pneumonia 06/11/2023   Coronary arteriosclerosis 06/11/2023   Essential hypertension 06/11/2023   Hyperlipidemia 06/11/2023   Primary stress urinary incontinence 06/11/2023   Cholelithiasis 01/26/2023   Intractable lower abdominal pain 01/26/2023   Prolonged QT interval 12/21/2022   Malnutrition of moderate degree 12/19/2022   Persistent atrial fibrillation (HCC) 12/15/2022   Chronic combined systolic and diastolic  CHF (congestive heart failure) (HCC) 12/15/2022   Pressure injury of skin 12/15/2022   Low back pain 08/07/2020   Trigger finger of right thumb 04/17/2020   Carpal tunnel syndrome, bilateral 09/26/2019   Shoulder pain, bilateral 08/03/2019   Cervicalgia 03/28/2018   GERD (gastroesophageal reflux disease) 04/10/2013   HTN (hypertension) 04/10/2013   CAD (coronary artery  disease) of artery bypass graft 10/04/2012   Hypothyroidism 10/04/2012   Hyperlipidemia with target LDL less than 70 10/04/2012   Kidney stones 01/27/2012   Hx of appendectomy-history of ruptured requiring ileocecectomy (~1994) 01/27/2012   Past Medical History:  Diagnosis Date   Blood transfusion without reported diagnosis    CAD (coronary artery disease)    GERD (gastroesophageal reflux disease)    Heart attack (HCC)    Hyperlipidemia    Hypertension 03/07/2012   ECHO-WNL     08/12/11 Lexiscan MyoviewNo significant ischemia demonstrated Low risk scan There is a moderate sized dense scar in the LCX territoy unchanged from the prior study.. Post- stress EF is 40%.   Septic shock (HCC) 12/10/2022   Thyroid  disease    Past Surgical History:  Procedure Laterality Date   APPENDECTOMY     CERVICAL SPINE SURGERY     titanium plate in the back of neck   CORONARY ARTERY BYPASS GRAFT     HERNIA REPAIR     SMALL INTESTINE SURGERY     THYROID  SURGERY     1/2 thyroid  removed on right side   Allergies  Allergen Reactions   Procardia [Nifedipine] Other (See Comments)    Hypotension    Phenergan [Promethazine Hcl] Nausea And Vomiting   Prior to Admission medications   Medication Sig Start Date End Date Taking? Authorizing Provider  acetaminophen  (TYLENOL ) 325 MG tablet Take 650 mg by mouth every 6 (six) hours as needed.   Yes [provider]  apixaban  (ELIQUIS ) 5 MG TABS tablet Take 1 tablet (5 mg total) by mouth 2 (two) times daily. 06/21/23  Yes Benjiman Bras, MD  cyanocobalamin  (VITAMIN B12) 1000 MCG tablet Take 1 tablet (1,000 mcg total) by mouth daily. 06/15/23  Yes Benjiman Bras, MD  folic acid  (FOLVITE ) 1 MG tablet Take 1 tablet (1 mg total) by mouth daily. 03/01/23  Yes Benjiman Bras, MD  magnesium  chloride (SLOW-MAG) 64 MG TBEC SR tablet Take 1 tablet (64 mg total) by mouth daily. 06/01/23  Yes Benjiman Bras, MD  memantine  (NAMENDA ) 5 MG tablet Take 1 tablet  (5 mg at night) for 2 weeks, then increase to 1 tablet (5 mg) twice a day 05/07/23  Yes Wertman, Sara E, PA-C  mirtazapine  (REMERON  SOL-TAB) 15 MG disintegrating tablet DISSOLVE 1 TABLET BY MOUTH ONCE DAILY 07/26/23  Yes Benjiman Bras, MD  nitroGLYCERIN  (NITROSTAT ) 0.4 MG SL tablet Place 1 tablet (0.4 mg total) under the tongue every 5 (five) minutes as needed for chest pain. If med is needed, be seen by medical provider immediately. 06/11/23  Yes Benjiman Bras, MD  oxybutynin  (DITROPAN ) 5 MG tablet Take 1 tablet (5 mg total) by mouth every 8 (eight) hours as needed for bladder spasms. 01/30/23  Yes Leona Rake, MD  pantoprazole  (PROTONIX ) 40 MG tablet Take 1 tablet by mouth once daily 07/26/23  Yes Benjiman Bras, MD  potassium chloride  SA (KLOR-CON  M) 20 MEQ tablet Take 1 tablet (20 mEq total) by mouth daily. 04/29/23  Yes Benjiman Bras, MD  psyllium (METAMUCIL) 58.6 % packet Take 1 packet by  mouth daily.   Yes [provider]  rosuvastatin  (CRESTOR ) 20 MG tablet Take 10 mg by mouth daily. 04/27/23  Yes [provider]  senna (SENOKOT) 8.6 MG TABS tablet Take 1 tablet (8.6 mg total) by mouth daily. 01/30/23  Yes Leona Rake, MD  torsemide  (DEMADEX ) 20 MG tablet Take 0.5-1 tablets (10-20 mg total) by mouth daily. 05/26/23  Yes Benjiman Bras, MD   Social History   Socioeconomic History   Marital status: Married    Spouse name: Not on file   Number of children: 0   Years of education: 16   Highest education level: Some college, no degree  Occupational History   Occupation: Retired  Tobacco Use   Smoking status: Never   Smokeless tobacco: Never  Vaping Use   Vaping status: Never Used  Substance and Sexual Activity   Alcohol use: No    Alcohol/week: 0.0 standard drinks of alcohol   Drug use: No   Sexual activity: Never  Other Topics Concern   Not on file  Social History Narrative   Married.    One floor home    Around the clock care   Drinks  caffeine   Bradley Oconnor is the brother   Social Drivers of Corporate investment banker Strain: Low Risk  (07/16/2022)   Overall Financial Resource Strain (CARDIA)    Difficulty of Paying Living Expenses: Not hard at all  Food Insecurity: No Food Insecurity (01/26/2023)   Hunger Vital Sign    Worried About Running Out of Food in the Last Year: Never true    Ran Out of Food in the Last Year: Never true  Transportation Needs: No Transportation Needs (01/26/2023)   PRAPARE - Administrator, Civil Service (Medical): No    Lack of Transportation (Non-Medical): No  Physical Activity: Insufficiently Active (07/16/2022)   Exercise Vital Sign    Days of Exercise per Week: 3 days    Minutes of Exercise per Session: 30 min  Stress: No Stress Concern Present (07/16/2022)   [employer] of Occupational Health - Occupational Stress Questionnaire    Feeling of Stress : Not at all  Social Connections: Moderately Isolated (07/16/2022)   Social Connection and Isolation Panel [NHANES]    Frequency of Communication with Friends and Family: Three times a week    Frequency of Social Gatherings with Friends and Family: Three times a week    Attends Religious Services: Never    Active Member of Clubs or Organizations: No    Attends Banker Meetings: Never    Marital Status: Married  Catering manager Violence: Not At Risk (01/26/2023)   Humiliation, Afraid, Rape, and Kick questionnaire    Fear of Current or Ex-Partner: No    Emotionally Abused: No    Physically Abused: No    Sexually Abused: No    Review of Systems   Objective:   Vitals:   07/30/23 0944  BP: 110/62  Pulse: 83  Temp: 98 F (36.7 C)  TempSrc: Temporal  SpO2: 99%  Height: 5\' 7"  (1.702 m)     Physical Exam Vitals reviewed.  Constitutional:      Appearance: He is well-developed.  HENT:     Head: Normocephalic and atraumatic.  Neck:     Vascular: No carotid bruit or JVD.  Cardiovascular:     Rate  and Rhythm: Normal rate and regular rhythm.     Heart sounds: Normal heart sounds. No murmur heard. Pulmonary:  Effort: Pulmonary effort is normal.     Breath sounds: Normal breath sounds. No rales.  Musculoskeletal:     Right lower leg: No edema.     Left lower leg: No edema.  Skin:    General: Skin is warm and dry.     Comments: Scalp with 1 small area of excoriation at left temporal area but no surrounding erythema, no discharge, no induration, no other rash appreciated.  Possible slight dry skin at the crown of the scalp but again no rash.  Neurological:     Mental Status: He is alert and oriented to person, place, and time.  Psychiatric:        Mood and Affect: Mood normal.        Assessment & Plan:  Bradley Oconnor is a 83 y.o. male . Adjustment disorder, unspecified type Insomnia, unspecified type  - Stable with current dose of meds as above, sleep improved with use of melatonin, continue same with RTC precautions.  History of CVA in adulthood - Plan: Comprehensive metabolic panel with GFR, Lipid panel Coronary artery disease involving coronary bypass graft of native heart with angina pectoris (HCC) - Plan: Comprehensive metabolic panel with GFR, Lipid panel  - Check lipid panel now that he is back on statin above, may need to adjust regimen or consider additional meds given history of CVA, CAD.  Asymptomatic at this time.  Adjust plan accordingly based on lab results.  Dry scalp  - Trial of different shampoo/conditioner in place of head and shoulders, if persistent symptoms follow-up and we can discuss whether there may be a component of seborrheic dermatitis of scalp and need for ketoconazole plus or minus steroid cream.  Does not appear to be the case at this time.  Bilateral hip pain  - Of injury, more notable upon awakening, improves with some activity but then some soreness with activity as above.  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  Symptomatic care, Tylenol  discussed with RTC precautions if persistent.  Hold on imaging at this time  Meds ordered this encounter  Medications   apixaban  (ELIQUIS ) 5 MG TABS tablet    Sig: Take 1 tablet (5 mg total) by mouth 2 (two) times daily.    Dispense:  60 tablet    Refill:  5   potassium chloride  SA (KLOR-CON  M) 20 MEQ tablet    Sig: Take 1 tablet (20 mEq total) by mouth daily.    Dispense:  90 tablet    Refill:  1   Patient Instructions  Try tylenol  a few times per day for possible arthritis pain in hips, hands. Some of your soreness may be due to increased activity which is good for your health.  Try to find the right balance with activity and that soreness. Follow up if not improving.   You can try more moisturizing shampoo and conditioner, but follow up if not improving or if there is a rash.   I will check cholesterol levels today on the medication, but keep follow-up with cardiology.  Depending on your readings today we may need to decide on higher dose of cholesterol meds or additional medications.  Again this can be discussed with cardiology as well.  I will let you know more info once I review your labs.  You are up to date on pneumonia vaccine - you had Prevnar 20 in 2022 - repeat in 5 years.  Ok to have shingles vaccine at pharmacy as well as RSV and  HEP A and B vaccines.   Please let me know if there are any questions or concerns we were not able to address today.  Otherwise I will see you in 3 months.  Take care!        Signed,   Caro Christmas, MD [facility], [facility] 07/30/23 10:28 AM

## 2023-07-31 ENCOUNTER — Encounter: Payer: Self-pay | Admitting: Family Medicine

## 2023-08-03 ENCOUNTER — Encounter: Payer: Self-pay | Admitting: Family Medicine

## 2023-08-04 ENCOUNTER — Other Ambulatory Visit: Payer: Self-pay | Admitting: Family Medicine

## 2023-08-04 MED ORDER — TORSEMIDE 20 MG PO TABS
10.0000 mg | ORAL_TABLET | Freq: Every day | ORAL | 0 refills | Status: DC
Start: 2023-08-04 — End: 2023-10-04

## 2023-08-04 NOTE — Telephone Encounter (Signed)
 Recent visit with me, will refill torsemide  same dose as prior.

## 2023-08-04 NOTE — Telephone Encounter (Signed)
 Copied from CRM [id number]. Topic: Clinical - Medication Refill >> Aug 04, 2023 10:58 AM Juleen Oakland F wrote: Medication: torsemide    Has the patient contacted their pharmacy? Yes (Agent: If no, request that the patient contact the pharmacy for the refill. If patient does not wish to contact the pharmacy document the reason why and proceed with request.) (Agent: If yes, when and what did the pharmacy advise?)  This is the patient's preferred pharmacy:  [facility] [address] [place] - [address] [place] [address] Phone: [phone] Fax: [phone]  Is this the correct pharmacy for this prescription? Yes If no, delete pharmacy and type the correct one.   Has the prescription been filled recently? Yes  Is the patient out of the medication? No  Has the patient been seen for an appointment in the last year OR does the patient have an upcoming appointment? Yes  Can we respond through MyChart? No  Agent: Please be advised that Rx refills may take up to 3 business days. We ask that you follow-up with your pharmacy.

## 2023-08-04 NOTE — Telephone Encounter (Signed)
 Last Fill: 05/26/23  Last OV: 07/30/23 Next OV: 11/12/23  Routing to provider for review/authorization.

## 2023-08-09 ENCOUNTER — Telehealth: Payer: Self-pay

## 2023-08-09 NOTE — Telephone Encounter (Signed)
 Pt has been notified  Pts caregiver was given lab results per pt request

## 2023-08-09 NOTE — Telephone Encounter (Signed)
 Copied from CRM [id number]. Topic: General - Other >> Aug 09, 2023 10:44 AM Howard Macho wrote: Reason for CRM: patient care giver returning a call to [facility] but as I was transferring the call the caregiver disconnected. Mckenzie stated she will call the patient back

## 2023-08-09 NOTE — Telephone Encounter (Signed)
 Patient caregiver was called for lab results by Bradley Oconnor who asked to speak to Bradley Oconnor who has dementia and cannot take results for himself, Bradley Oconnor has been on DPR for 5 months and is preferred contact, Bradley Oconnor stated Bradley Oconnor was "extremely rude and would not slow down" and noted Bradley Oconnor is not permitted to call her again I informed her I did not have power over this but that I could speak with manager and see how we can remedy this, Bradley Oconnor is open to call from [place] if necessary to ensure that Bradley Oconnor does not call again. I told Bradley Oconnor I would pass this along to [facility] and we would follow up.

## 2023-08-09 NOTE — Telephone Encounter (Signed)
-----   Message from Benjiman Bras sent at 08/08/2023  3:04 PM EDT ----- Results sent by MyChart, but appears patient has not yet reviewed those results.  Please call and make sure they have either seen note or discuss result note.  Thanks.

## 2023-08-24 NOTE — Telephone Encounter (Signed)
 Noted and discussed this with Tonya.

## 2023-08-25 ENCOUNTER — Telehealth: Payer: Self-pay | Admitting: Family Medicine

## 2023-08-25 DIAGNOSIS — F4320 Adjustment disorder, unspecified: Secondary | ICD-10-CM

## 2023-08-25 NOTE — Telephone Encounter (Unsigned)
 Copied from CRM [id number]. Topic: Clinical - Medication Refill >> Aug 25, 2023 10:00 AM Luane Rumps D wrote: Medication:  pantoprazole  (PROTONIX ) 40 MG tablet mirtazapine  (REMERON  SOL-TAB) 15 MG disintegrating tablet  Has the patient contacted their pharmacy? Yes (Agent: If no, request that the patient contact the pharmacy for the refill. If patient does not wish to contact the pharmacy document the reason why and proceed with request.) (Agent: If yes, when and what did the pharmacy advise?)  This is the patient's preferred pharmacy:  [facility] [address] [place] - [address] [place] [address] Phone: [phone] Fax: [phone]  Is this the correct pharmacy for this prescription? Yes If no, delete pharmacy and type the correct one.   Has the prescription been filled recently? Yes  Is the patient out of the medication? No  Has the patient been seen for an appointment in the last year OR does the patient have an upcoming appointment? Yes  Can we respond through MyChart? No  Agent: Please be advised that Rx refills may take up to 3 business days. We ask that you follow-up with your pharmacy.

## 2023-08-26 MED ORDER — PANTOPRAZOLE SODIUM 40 MG PO TBEC: 40.0000 mg | DELAYED_RELEASE_TABLET | Freq: Every day | ORAL | 0 refills | Status: DC

## 2023-08-26 MED ORDER — MIRTAZAPINE 15 MG PO TBDP: 15.0000 mg | ORAL_TABLET | Freq: Every day | ORAL | 0 refills | Status: DC

## 2023-08-30 ENCOUNTER — Telehealth: Payer: Self-pay

## 2023-08-30 ENCOUNTER — Telehealth: Payer: Self-pay | Admitting: Family Medicine

## 2023-08-30 NOTE — Telephone Encounter (Signed)
 Coricidin and Tylenol  are reasonable.   I do recommend a visit if he is not improving and can discuss other medications or treatments based on that evaluation.  Thank you!

## 2023-08-30 NOTE — Telephone Encounter (Signed)
 Copied from CRM [id number]. Topic: Clinical - Medication Refill >> Aug 30, 2023 10:35 AM Turkey A wrote: Medication: acetaminophen  (TYLENOL ) 325 MG tablet apixaban  (ELIQUIS ) 5 MG TABS tablet   Has the patient contacted their pharmacy? No (Agent: If no, request that the patient contact the pharmacy for the refill. If patient does not wish to contact the pharmacy document the reason why and proceed with request.) (Agent: If yes, when and what did the pharmacy advise?)  This is the patient's preferred pharmacy:  [facility] [address] [place] - [address] [place] [address] Phone: [phone] Fax: [phone]  Is this the correct pharmacy for this prescription? Yes If no, delete pharmacy and type the correct one.   Has the prescription been filled recently? No  Is the patient out of the medication? No  Has the patient been seen for an appointment in the last year OR does the patient have an upcoming appointment? Yes  Can we respond through MyChart? Yes  Agent: Please be advised that Rx refills may take up to 3 business days. We ask that you follow-up with your pharmacy.

## 2023-08-30 NOTE — Telephone Encounter (Signed)
 Bradley Oconnor has a few concerns today, notes some cough, fatigue, low temperature, notes has been giving Bradley Oconnor tylenol , seems okay for the most part but worried about the cough.  Willing to bring Bradley Oconnor in if that's the recommendations was also advised buy pharmacist to try coricidin for cough but Bradley Oconnor wanted to confirm before starting this.  Notes a bit better today than over the weekend, notes COVID-19 testing was negative this weekend Sunday sxs started Thursday evening

## 2023-08-30 NOTE — Telephone Encounter (Signed)
 Okay for him to take Coricidin? Is there other recommendations? Bradley Oconnor is asking for your opinion before giving him anything more than tylonol OTC

## 2023-08-30 NOTE — Telephone Encounter (Signed)
 Glad to hear he is a little bit better, but if any persistent or worsening symptoms should be evaluated with a visit.  Thanks

## 2023-08-30 NOTE — Telephone Encounter (Signed)
 Copied from CRM [id number]. Topic: General - Other >> Aug 30, 2023 10:38 AM Turkey A wrote: Reason for CRM: Caregiver Kimberley would like for McKenzie to give her a call today regarding patient

## 2023-08-31 NOTE — Telephone Encounter (Signed)
 Bradley Oconnor has been informed and will call if he needs evaluation

## 2023-09-06 ENCOUNTER — Ambulatory Visit: Payer: Self-pay

## 2023-09-06 NOTE — Telephone Encounter (Signed)
 Copied From CRM [id number]. Reason for Triage: Pt was trying to pick up prescription apixaban  (ELIQUIS ) 5 MG TABS tablet- advise there was no prescription for it. [id number] Jullie Oiler)  Reason for Disposition . Nursing judgment  Protocols used: No Guideline or Reference Available-A-AH

## 2023-09-06 NOTE — Telephone Encounter (Signed)
 Left vm this rx was sent in 07/30/2023 with refills attached. Please call pharmacy and if he is unable to get this to call us  back

## 2023-09-08 ENCOUNTER — Ambulatory Visit: Attending: Cardiovascular Disease | Admitting: Cardiovascular Disease

## 2023-09-08 ENCOUNTER — Encounter: Payer: Self-pay | Admitting: Cardiovascular Disease

## 2023-09-08 DIAGNOSIS — I2510 Atherosclerotic heart disease of native coronary artery without angina pectoris: Secondary | ICD-10-CM

## 2023-09-08 DIAGNOSIS — I5042 Chronic combined systolic (congestive) and diastolic (congestive) heart failure: Secondary | ICD-10-CM

## 2023-09-08 DIAGNOSIS — Z86718 Personal history of other venous thrombosis and embolism: Secondary | ICD-10-CM

## 2023-09-08 DIAGNOSIS — Z951 Presence of aortocoronary bypass graft: Secondary | ICD-10-CM

## 2023-09-08 DIAGNOSIS — Z86711 Personal history of pulmonary embolism: Secondary | ICD-10-CM | POA: Diagnosis not present

## 2023-09-08 DIAGNOSIS — I2111 ST elevation (STEMI) myocardial infarction involving right coronary artery: Secondary | ICD-10-CM

## 2023-09-08 DIAGNOSIS — I5022 Chronic systolic (congestive) heart failure: Secondary | ICD-10-CM

## 2023-09-08 DIAGNOSIS — Z7901 Long term (current) use of anticoagulants: Secondary | ICD-10-CM

## 2023-09-08 MED ORDER — EZETIMIBE 10 MG PO TABS: 10.0000 mg | ORAL_TABLET | Freq: Every day | ORAL | 3 refills | Status: AC

## 2023-09-08 MED ORDER — ROSUVASTATIN CALCIUM 20 MG PO TABS: 20.0000 mg | ORAL_TABLET | Freq: Every day | ORAL | 3 refills | Status: DC

## 2023-09-08 NOTE — Progress Notes (Signed)
 Patient ID: Bradley Oconnor, male   DOB: 05-27-1940, 83 y.o.   MRN: [id number]        HPI: Bradley Oconnor, is a 83 y.o. male who presents to the office for a 26 month follow-up cardiology evaluation.  Bradley Oconnor has established CAD dating back to 1992 when he suffered an inferior wall myocardial infarction and I performed PTCA of a totally occluded RCA. In 1993 due to progressive CAD, he underwent CABG surgery with a LIMA to his LAD, vein graft sequentially to a diagonal and marginal vein graft to his PDA branch of his right carotid artery. In September 2002 a stent was placed the PLA of his RCA.  In June 2011, he suffered a non-ST segment elevation MI which was felt to be due to RCA graft occlusion which supplied the PDA and PLA vessel. The PDA was extensively collateralized now via the left circumflex territory. His native RCA was totally occluded at the mid level. His LIMA graft is widely patent as was the sequential graft to the diagonal marginal vessel. He has done well particularly with the addition of Ranexa  titrated up to 1000 twice a day added to his medical regimen. He believes the Ranexa  has made a huge difference in his anginal symptomatology. He is unaware of palpitations he denies presyncope or syncope.  Additional problems include GERD, hyperlipidemia, hypertension.  In 2015, he experienced 3 short-lived episodes of some mild chest pain.  He feels that his CAD is stable.  His wife unfortunately has suffered multiple small strokes and she is now fairly incapacitated and he spends much of his time caring for her.  As result, he has not been as active as he had in the past.  Over the past year, he denies recurrent anginal symptoms. He denies dizziness.  He he has been on ramipril  10 mg, amlodipine  5 mg, HCTZ 12.5 mg as needed for edema, in addition to Toprol -XL 125 mg and isosorbide  120 mg.  He denies bleeding on aspirin .  He has been taking 325 mg aspirin .  He also has been on ranolazine   1000 g twice a day.  He is was recently switched by his insurance company to generic Crestor  20 mg daily, which he has been on for 30 days and also takes over-the-counter fish oil. He has hypothyroidism on Synthroid  replacement and Prevacid for GERD.   He underwent an echo Doppler study on 07/07/2016.  This showed normal systolic function with an EF of 55-60%.  There was grade 1 diastolic dysfunction.  He had mild aortic sclerosis with trivial AR, mild to moderate mitral regurgitation, and had biatrial enlargement, left greater than right.  Peak PA pressure was 25 mm  Ehen I saw [name]  he remained stable.  He denies any recurrent chest pain.  He believes the addition of Ranexa  to his medical regimen.  Several years ago was again change her and made dramatic improvement in his symptomatology.  He has not been able to exercise as regularly and often times does not sleep as well in caring for his disabled wife. She had suffered multiple strokes in addition has dementia. He is unaware of PND, orthopnea, palpitations, presyncope or syncope.  I saw him in March 2019 and subsequently evaluated him in a telemedicine visit on July 01, 2018 due to COVID-19 pandemic.  Over the year prior to that evaluation he had continued to do remarkably well on an aggressive medical regimen consisting of metoprolol  succinate 125 mg daily,  amlodipine  5 mg, isosorbide  120 mg, and ranolazine  1000 mg twice a day.  Over the  8 months prior to the telemedicine visit he admitted to 3 short-lived episodes of chest discomfort which lasted approximately 10 to 15 minutes and ultimately went away on its own.  If he starts out walking more rapidly he may note some mild shortness of breath initially but this improves as he continues to walk.  He has continued to cut his grass at home and do yard work without typical anginal symptoms.  He does have periods of intermittent ankle swelling for which he has been taking HCTZ 12.5 mg as  needed.   He also has issues with GERD and it is becoming more difficult to obtain Prevacid and as result he now is on Nexium and as long as he takes treatment he does not have reflux symptomatology.  He continues to care for his disabled wife who also has significant dementia.  She was hospitalized until early this week with recent pneumonia and urinary infection.  She is now back home.  As result he has not been able to exercise regularly due to caring for her.  He has continued to be on rosuvastatin  for hyperlipidemia.  He continues to be on omega-3 fatty acid. Two years ago triglyceride levels were significantly elevated which did improve with omega-3 fatty acid therapy.  He has not had laboratory checked in a year.  He had recently developed significant neck pain and has undergone Cervical epidural cortisone injection which he states helped for approximately 2-1/2 weeks but his symptoms have recurred.   I saw him in the office in January 2021.  Since his last telemedicine evaluation, he has continued to feel well.  He has only required taking sublingual nitroglycerin  on 2 occasions over the past 9 months.  His blood pressure has been stable.  He is unaware of palpitations presyncope or syncope.  He is continuing to care for his disabled wife.    When I saw him on November 23, 2019 he admitted to being somewhat more fatigued.  He has had purposeful weight loss of approximately 8 pounds.  He continues to care for his disabled wife.  He recently underwent bilateral Dupuytren contracture surgery by Dr. Aviva Oconnor as result of bilateral carpal tunnel syndrome.  He tolerated this well.  He continues to be on amlodipine  5 mg daily, HCTZ 12.5 mg on a as needed basis, metoprolol  succinate now at the reduced dose of 100 mg in addition to ramipril  10 mg and ranolazine  1000 mg twice a day.  He is not having any anginal symptoms.  He is on rosuvastatin  20 mg for hyperlipidemia.  He had not had recent laboratory and  follow-up labs were recommended.  I last saw him on July 16, 2021.  He states that in January 2022 he had COVID on 2 separate occasions.  He has had some issues with balance.  He denied any recurrent chest pain he has noted some very mild swelling right ankle greater than left.  He continues to be on amlodipine  5 mg, HCTZ 12.5 mg, isosorbide  120 mg daily in addition to ramipril  10 mg and ranolazine  1000 mg twice a day.  He has been without anginal symptomatology.  He continues to be on rosuvastatin  for hyperlipidemia.  Laboratory in December 2022 showed LDL cholesterol at 65.  Hemoglobin A1c was 6.2 consistent with prediabetes.  Since I last saw him, he had another significant episode of COVID in 2024 and was hospitalized  and was on a ventilator at [facility] in [place].  He was evaluated by Marlana Silvan, NP following his hospitalization and was seen on January 18, 2023.  During that hospitalization he was felt to have acute pulmonary emboli resulting from right lower extremity DVT and also had acute metabolic encephalopathy, delirium, and CT of his head was without acute findings.  He also had protein calorie malnutrition.  During that hospitalization Lexapro  was discontinued in the setting of prolonged QTc.  An echo Doppler study showed EF of 25 to 30% with mild LA dilation, mild MR, aortic valve sclerosis without stenosis and mild dilation of ascending aorta at 41 mm.  Presently, he has felt well following his recovery and does walk.  He has more energy.  He continues to be on Eliquis  for anticoagulation, torsemide  10 mg daily, rosuvastatin  10 mg for lipid management and pantoprazole  40 mg daily.  He is on Namenda .  He presents for follow-up cardiology evaluation with me prior to my retirement.  Past Medical History:  Diagnosis Date   Blood transfusion without reported diagnosis    CAD (coronary artery disease)    GERD (gastroesophageal reflux disease)    Heart attack (HCC)     Hyperlipidemia    Hypertension 03/07/2012   ECHO-WNL     08/12/11 Lexiscan MyoviewNo significant ischemia demonstrated Low risk scan There is a moderate sized dense scar in the LCX territoy unchanged from the prior study.. Post- stress EF is 40%.   Septic shock (HCC) 12/10/2022   Thyroid  disease     Past Surgical History:  Procedure Laterality Date   APPENDECTOMY     CERVICAL SPINE SURGERY     titanium plate in the back of neck   CORONARY ARTERY BYPASS GRAFT     HERNIA REPAIR     SMALL INTESTINE SURGERY     THYROID  SURGERY     1/2 thyroid  removed on right side    Allergies  Allergen Reactions   Procardia [Nifedipine] Other (See Comments)    Hypotension    Phenergan [Promethazine Hcl] Nausea And Vomiting    Current Outpatient Medications  Medication Sig Dispense Refill   amLODipine  (NORVASC ) 5 MG tablet Take 1 tablet (5 mg total) by mouth daily. Please keep scheduled appointment for further refills 60 tablet 0   aspirin  EC 81 MG tablet Take 1 tablet (81 mg total) by mouth daily. 90 tablet 3   esomeprazole (NEXIUM) 20 MG capsule Take 20 mg by mouth daily at 12 noon.     fish oil-omega-3 fatty acids 1000 MG capsule Take 1 g by mouth daily.      Garlic 1000 MG CAPS Take 1 capsule by mouth daily.      hydrochlorothiazide  (MICROZIDE ) 12.5 MG capsule Take 1 capsule (12.5 mg total) by mouth daily. 90 capsule 1   isosorbide  mononitrate (IMDUR ) 120 MG 24 hr tablet Take 1 tablet (120 mg total) by mouth daily. KEEP OV. 30 tablet 2   lansoprazole (PREVACID) 15 MG capsule Take 15 mg by mouth daily at 12 noon.     levothyroxine  (SYNTHROID ) 100 MCG tablet TAKE 1 TABLET BY MOUTH IN THE MORNING NEED OFFICE VISIT WITH DR. Loetta Ringer FOR FUTURE REFILLS 90 tablet 2   metoprolol  succinate (TOPROL -XL) 100 MG 24 hr tablet TAKE 1 TABLET BY MOUTH ONCE DAILY. PATIENT NEEDS TO SCHEDULE AN CARDIOLOGY APPOINTMENT IN ORDER TO RECEIVE FUTURE REFILLS ON MEDICATION 180 tablet 1   nitroGLYCERIN  (NITROSTAT ) 0.4 MG SL  tablet DISSOLVE ONE TABLET  UNDER THE TONGUE EVERY 5 MINUTES AS NEEDED FOR CHEST PAIN.  DO NOT EXCEED A TOTAL OF 3 DOSES IN 15 MINUTES 25 tablet 3   ramipril  (ALTACE ) 10 MG capsule Take 1 capsule by mouth once daily 90 capsule 1   ranolazine  (RANEXA ) 1000 MG SR tablet TAKE 1  BY MOUTH TWICE DAILY 180 tablet 1   rosuvastatin  (CRESTOR ) 20 MG tablet TAKE 1 TABLET BY MOUTH ONCE DAILY IN THE EVENING 90 tablet 1   dextromethorphan-guaiFENesin  (MUCINEX  DM) 30-600 MG 12hr tablet Take 1 tablet by mouth 2 (two) times daily. (Patient not taking: Reported on 07/16/2021) 20 tablet 0   fluticasone  (FLONASE ) 50 MCG/ACT nasal spray Place 2 sprays into both nostrils daily. (Patient not taking: Reported on 07/16/2021) 16 g 6   No current facility-administered medications for this visit.    Socially he is married. He does try to do some exercise. He remains relatively active with yard work. Is no tobacco or alcohol use.  ROS General: Negative; No fevers, chills, or night sweats;  HEENT: Uses a hearing aid; No changes in vision , sinus congestion, difficulty swallowing Pulmonary: Negative; No cough, wheezing, shortness of breath, hemoptysis Cardiovascular: See HPI GI: Negative; No nausea, vomiting, diarrhea, or abdominal pain GU: Negative; No dysuria, hematuria, or difficulty voiding Musculoskeletal: Negative; no myalgias, joint pain, or weakness Hematologic/Oncology: Anticoagulated following DVT Endocrine: Negative; no heat/cold intolerance; no diabetes Neuro: Negative; no changes in balance, headaches Skin: Negative; No rashes or skin lesions Psychiatric: Negative; No behavioral problems, depression Sleep: Negative; No snoring, daytime sleepiness, hypersomnolence, bruxism, restless legs, hypnogognic hallucinations, no cataplexy Other comprehensive 14 point system review is negative.   PE BP (!) 113/58   Pulse 76   Ht 5' 7 (1.702 m)   Wt 151 lb 6.4 oz (68.7 kg)   SpO2 96%   BMI 23.71 kg/m    Repeat  blood pressure by me was 106/60  Wt Readings from Last 3 Encounters:  09/08/23 151 lb 6.4 oz (68.7 kg)  06/11/23 136 lb (61.7 kg)  05/06/23 136 lb 6 oz (61.9 kg)   General: Alert, oriented, no distress.  Skin: normal turgor, no rashes, warm and dry HEENT: Normocephalic, atraumatic. Pupils equal round and reactive to light; sclera anicteric; extraocular muscles intact;  Nose without nasal septal hypertrophy Mouth/Parynx benign; Mallinpatti scale 3 Neck: No JVD, no carotid bruits; normal carotid upstroke Lungs: clear to ausculatation and percussion; no wheezing or rales Chest wall: without tenderness to palpitation Heart: PMI not displaced, RRR, s1 s2 normal, 1/6 systolic murmur, no diastolic murmur, no rubs, gallops, thrills, or heaves Abdomen: soft, nontender; no hepatosplenomehaly, BS+; abdominal aorta nontender and not dilated by palpation. Back: no CVA tenderness Pulses 2+ Musculoskeletal: full range of motion, normal strength, no joint deformities Extremities: no clubbing cyanosis or edema, Homan's sign negative  Neurologic: grossly nonfocal; Cranial nerves grossly wnl Psychologic: Normal mood and affect  EKG Interpretation Date/Time:  Wednesday September 08 2023 11:42:47 EDT Ventricular Rate:  76 PR Interval:  152 QRS Duration:  88 QT Interval:  400 QTC Calculation: 450 R Axis:   -31  Text Interpretation: Sinus rhythm with Premature atrial complexes with Abberant conduction Left axis deviation Minimal voltage criteria for LVH, may be normal variant ( R in aVL ) Lateral infarct , age undetermined Inferior infarct , age undetermined When compared with ECG of 21-Apr-2023 19:11, PREVIOUS ECG IS PRESENT Confirmed by Magnus Schuller ([id number]) on 09/10/2023 9:55:24 AM     July 16, 2021 ECG (independently  read by me): Sinus bradycardia at 51; LVH, Old inferior Q waves  August 26, 2021ECG (independently read by me): Sinus bradycardia 55 bpm, LVH by voltage criteria.  Inferior Q waves  consistent with old IMI.  QTc interval 461 ms.  January 2021 ECG (independently read by me): Sinus bradycardia 51 bpm.  Mild LVH.  Old inferior Q waves.  QTc interval 471 ms  March 2019 ECG (independently read by me): sinus bradycardia 50 bpm.  LVH with mild ST changes.  Old inferior infarct.  Prolonged first-degree AV block with a PR interval 2063 seconds.  QTc interval 477 ms.  August 2018 ECG (independently read by me): Sinus bradycardia 56 bpm.  Borderline LVH.  Q waves in lead 3 and aVF.  QTc interval 467 ms  January 2018 ECG (independently read by me): Sinus bradycardia 55 bpm.  Q wave in lead 3 and aVF.  Nonspecific T changes.  January 2017 ECG (independently read by me): Normal sinus rhythm at 61 bpm.  LVH by voltage.  Old inferior MI with inferior Q waves.    January 2016 ECG (independently read by me): Sinus bradycardia 56 bpm.  Old inferior MI with Q waves and early transition, suggesting posterior wall involvement.  Nonspecific ST changes.  January 2015 ECG: Sinus rhythm at 58 beats per minute. Nonspecific T changes. Evidence for old inferior MI with Q waves in leads 3 and aVF.  LABS:    Latest Ref Rng & Units 07/30/2023   10:32 AM 06/11/2023   10:44 AM 05/06/2023   12:10 PM  BMP  Glucose 70 - 99 mg/dL 161  92  096   BUN 6 - 23 mg/dL 20  22  36   Creatinine 0.40 - 1.50 mg/dL 0.45  4.09  8.11   Sodium 135 - 145 mEq/L 138  138  134   Potassium 3.5 - 5.1 mEq/L 4.1  4.1  4.8   Chloride 96 - 112 mEq/L 102  100  99   CO2 19 - 32 mEq/L 28  29  26    Calcium  8.4 - 10.5 mg/dL 9.6  91.4  78.2       Latest Ref Rng & Units 07/30/2023   10:32 AM 06/11/2023   10:44 AM 04/24/2023   11:58 AM  Hepatic Function  Total Protein 6.0 - 8.3 g/dL 7.8  7.8  7.4   Albumin  3.5 - 5.2 g/dL 4.1  4.0  3.3   AST 0 - 37 U/L 22  19  28    ALT 0 - 53 U/L 18  18  22    Alk Phosphatase 39 - 117 U/L 87  88  86   Total Bilirubin 0.2 - 1.2 mg/dL 0.6  0.6  1.0       Latest Ref Rng & Units 05/06/2023   12:10 PM  04/24/2023   11:58 AM 04/21/2023    6:06 PM  CBC  WBC 4.0 - 10.5 K/uL 10.4  8.8  7.5   Hemoglobin 13.0 - 17.0 g/dL 95.6  21.3  08.6   Hematocrit 39.0 - 52.0 % 42.6  39.7  38.5   Platelets 150.0 - 400.0 K/uL 406.0  226  228    Lab Results  Component Value Date   MCV 93.7 05/06/2023   MCV 95.4 04/24/2023   MCV 95.8 04/21/2023   Lab Results  Component Value Date   HGBA1C 6.2 03/03/2021    Lab Results  Component Value Date   TSH 1.36 05/07/2023   Lipid Panel  Component Value Date/Time   CHOL 225 (H) 07/30/2023 1032   CHOL 141 06/18/2017 1233   TRIG 201.0 (H) 07/30/2023 1032   HDL 44.40 07/30/2023 1032   HDL 44 06/18/2017 1233   CHOLHDL 5 07/30/2023 1032   VLDL 40.2 (H) 07/30/2023 1032   LDLCALC 141 (H) 07/30/2023 1032   LDLCALC 70 06/18/2017 1233    RADIOLOGY: No results found.  IMPRESSION:  1. Coronary artery disease involving native coronary artery of native heart without angina pectoris   2. Chronic systolic heart failure (HCC)     ASSESSMENT AND PLAN: Ms. Christopherson is a 83 year-old Caucasian male who suffered an  inferior wall myocardial infarction in 77 at age 51 and underwent initial PTCA of his totally occluded RCA.  Due to progressive CAD he underwent CABG revascularization surgery in 1993. He has documented RCA graft occlusion with good left-to-right collaterals. Since initiating Ranexa , this has made of remarkable difference in his symptomatology.  He continued to be without recurrent anginal symptomatology on a  regimen consisting of ranolazine  1000 mg twice a day, isosorbide  120 mg in the morning, metoprolol  succinate 100 mg daily and amlodipine  5 mg.  He also continues to be on ramipril  10 mg and HCTZ for hypertension.  He does have ankle swelling right greater than left.  I have suggested that on days he has more swelling to take 25 mg.  I have not seen him in several years.  He has had several bouts of COVID in 2024 was hospitalized at [facility]  and required ventilatory support.  During that hospitalization he was also was found to have acute pulmonary emboli secondary to right lower extremity DVT and had acute metabolic encephalopathy, delirium and protein calorie malnutrition.  Subsequently he also had developed UTI.  Medications were adjusted.  Presently, he is remaining relatively stable on his current regimen of torsemide  10 mg daily for his prior edema.  He is anticoagulated on Eliquis  5 mg twice a day.  He has only been taking rosuvastatin  10 mg.  He recently underwent laboratory on Jul 30, 2023.  This was notable for total cholesterol 225, triglycerides 201, LDL cholesterol was increased at 141 and VLDL 40.2.  Of note, in March 2025 LDL cholesterol was 212.  He sees Dr. Glendia Lands and he was started on rosuvastatin  10 mg following his March 2025 assessment.  Presently, with his LDL cholesterol still at 141 I have suggested he increase rosuvastatin  to 20 mg and I will also add Zetia  10 mg daily.  I have recommended a follow-up echo Doppler study in for reassessment of LV function and in 3 months, I have recommended a follow-up comprehensive metabolic panel, fasting lipid panel, and will also check LP(a).  He was very satisfied with his evaluation from Marlana Silvan, NP in October 2024.  In 3 months I will schedule him a follow-up appointment to see Marlana Silvan, NP.  He is aware of my imminent retirement and following her evaluation he will need to be transition to another cardiologist.    Millicent Ally, MD, [facility]  09/08/2023 12:14 PM

## 2023-09-08 NOTE — Patient Instructions (Signed)
 Medication Instructions:  INCREASE ROSUVASTATIN  CALCIUM  TO 20 MG DAILY.. START TAKING ZETIA 10 MG DAILY.   Lab Work: CMET, FASTING LIPID PANEL, AND LIPOPROTEIN.  Testing/Procedures: NONE  Follow-Up: At [facility], you and your health needs are our priority.  As part of our continuing mission to provide you with exceptional heart care, our providers are all part of one team.  This team includes your primary Cardiologist (physician) and Advanced Practice Providers or APPs (Physician Assistants and Nurse Practitioners) who all work together to provide you with the care you need, when you need it.  Your next appointment:   3-4 MONTHS  Provider:   Marlana Silvan, NP

## 2023-09-10 ENCOUNTER — Encounter: Payer: Self-pay | Admitting: Cardiovascular Disease

## 2023-09-21 DIAGNOSIS — Z961 Presence of intraocular lens: Secondary | ICD-10-CM | POA: Diagnosis not present

## 2023-09-21 DIAGNOSIS — H52203 Unspecified astigmatism, bilateral: Secondary | ICD-10-CM | POA: Diagnosis not present

## 2023-09-26 ENCOUNTER — Other Ambulatory Visit: Payer: Self-pay | Admitting: Family Medicine

## 2023-09-26 DIAGNOSIS — F4320 Adjustment disorder, unspecified: Secondary | ICD-10-CM

## 2023-09-27 ENCOUNTER — Other Ambulatory Visit: Payer: Self-pay | Admitting: Family Medicine

## 2023-09-27 DIAGNOSIS — F4320 Adjustment disorder, unspecified: Secondary | ICD-10-CM

## 2023-09-27 NOTE — Telephone Encounter (Signed)
 Copied from CRM [id number]. Topic: Clinical - Medication Refill >> Sep 27, 2023 10:45 AM [name] GRADE wrote: Medication: pantoprazole  (PROTONIX ) 40 MG tablet [[id number]],$MzfnczAzqnmzIZPI_SqxVbHcMcxdgBXOdBYluhNpLsLAOltNe$$MzfnczAzqnmzIZPI_SqxVbHcMcxdgBXOdBYluhNpLsLAOltNe$  (VITAMIN B12) 1000 MCG tablet [[id number]] & mirtazapine  (REMERON  SOL-TAB) 15 MG disintegrating tablet [[id number]]  Has the patient contacted their pharmacy? Yes, they asked patient to follow up with the office.  (Agent: If no, request that the patient contact the pharmacy for the refill. If patient does not wish to contact the pharmacy document the reason why and proceed with request.) (Agent: If yes, when and what did the pharmacy advise?)  This is the patient's preferred pharmacy:  [facility] [address], [place] - [address] [place] [address] Phone: [phone] Fax: [phone]  Is this the correct pharmacy for this prescription? Yes If no, delete pharmacy and type the correct one.   Has the prescription been filled recently? No  Is the patient out of the medication? No  Has the patient been seen for an appointment in the last year OR does the patient have an upcoming appointment? Yes  Can we respond through MyChart? Yes  Agent: Please be advised that Rx refills may take up to 3 business days. We ask that you follow-up with your pharmacy.

## 2023-10-04 ENCOUNTER — Other Ambulatory Visit: Payer: Self-pay | Admitting: Family Medicine

## 2023-10-04 MED ORDER — TORSEMIDE 20 MG PO TABS: 10.0000 mg | ORAL_TABLET | Freq: Every day | ORAL | 0 refills | Status: DC

## 2023-10-04 NOTE — Telephone Encounter (Signed)
 Copied from CRM [id number]. Topic: Clinical - Medication Refill >> Oct 04, 2023 11:29 AM Rea C wrote: Medication: apixaban  (ELIQUIS ) 5 MG TABS tablet, torsemide  (DEMADEX ) 20 MG tablet  Has the patient contacted their pharmacy? No, contacted us  and she will wait for [facility] to reach them.  (Agent: If no, request that the patient contact the pharmacy for the refill. If patient does not wish to contact the pharmacy document the reason why and proceed with request.) (Agent: If yes, when and what did the pharmacy advise?)  This is the patient's preferred pharmacy:  [facility] [address], [place] - [address] [place] [address] Phone: [phone] Fax: [phone]  Is this the correct pharmacy for this prescription? Yes If no, delete pharmacy and type the correct one.   Has the prescription been filled recently? Yes  Is the patient out of the medication? No, always call a week ahead of time.   Has the patient been seen for an appointment in the last year OR does the patient have an upcoming appointment? Yes  Can we respond through MyChart? No.   Agent: Please be advised that Rx refills may take up to 3 business days. We ask that you follow-up with your pharmacy.

## 2023-10-14 ENCOUNTER — Other Ambulatory Visit: Payer: Self-pay | Admitting: Family Medicine

## 2023-10-14 MED ORDER — MEMANTINE HCL 5 MG PO TABS: ORAL_TABLET | ORAL | 11 refills | Status: AC

## 2023-10-14 MED ORDER — ROSUVASTATIN CALCIUM 20 MG PO TABS: 20.0000 mg | ORAL_TABLET | Freq: Every day | ORAL | 3 refills | Status: AC

## 2023-10-14 NOTE — Telephone Encounter (Signed)
  Medication: memantine  (NAMENDA ) 5 MG tablet, rosuvastatin  (CRESTOR ) 20 MG tablet

## 2023-10-14 NOTE — Telephone Encounter (Signed)
 Patient is requesting refill on crestor  and memantine . Both of these medication were filled by another provider. Patient saw you in May 2025 and has a f/u with you in August. Okay to fill?

## 2023-10-25 ENCOUNTER — Other Ambulatory Visit: Payer: Self-pay | Admitting: Family Medicine

## 2023-10-25 MED ORDER — TORSEMIDE 20 MG PO TABS: 10.0000 mg | ORAL_TABLET | Freq: Every day | ORAL | 0 refills | Status: DC

## 2023-10-25 NOTE — Telephone Encounter (Unsigned)
 Copied from CRM [id number]. Topic: Clinical - Medication Refill >> Oct 25, 2023  9:36 AM Armenia J wrote: Medication:  torsemide  (DEMADEX ) 20 MG tablet  Has the patient contacted their pharmacy? No (Agent: If no, request that the patient contact the pharmacy for the refill. If patient does not wish to contact the pharmacy document the reason why and proceed with request.) (Agent: If yes, when and what did the pharmacy advise?)  This is the patient's preferred pharmacy:  [facility] [address] [place] - [address] [place] [address] Phone: [phone] Fax: [phone]  Is this the correct pharmacy for this prescription? Yes If no, delete pharmacy and type the correct one.   Has the prescription been filled recently? No  Is the patient out of the medication? No  Has the patient been seen for an appointment in the last year OR does the patient have an upcoming appointment? Yes  Can we respond through MyChart? No  Agent: Please be advised that Rx refills may take up to 3 business days. We ask that you follow-up with your pharmacy.

## 2023-10-29 ENCOUNTER — Other Ambulatory Visit: Payer: Self-pay | Admitting: Family Medicine

## 2023-10-29 DIAGNOSIS — F4320 Adjustment disorder, unspecified: Secondary | ICD-10-CM

## 2023-11-04 ENCOUNTER — Ambulatory Visit: Payer: PPO | Admitting: Physician Assistant

## 2023-11-05 ENCOUNTER — Telehealth: Payer: Self-pay | Admitting: Family Medicine

## 2023-11-05 MED ORDER — PANTOPRAZOLE SODIUM 40 MG PO TBEC
40.0000 mg | DELAYED_RELEASE_TABLET | Freq: Every day | ORAL | 0 refills | Status: DC
Start: 2023-11-05 — End: 2023-12-06

## 2023-11-05 NOTE — Telephone Encounter (Signed)
 Requested Prescriptions   Pending Prescriptions Disp Refills   pantoprazole  (PROTONIX ) 40 MG tablet 30 tablet 0    Sig: Take 1 tablet (40 mg total) by mouth daily.     Date of patient request: 11/05/23 Last office visit: 07/30/2023 Upcoming visit: 11/12/2023 Date of last refill: 09/27/23 Last refill amount: 30 days   I have sent refill of medication. Patient aware

## 2023-11-05 NOTE — Telephone Encounter (Signed)
 Copied from CRM [id number]. Topic: Clinical - Medication Refill >> Nov 05, 2023 11:16 AM Tanazia G wrote: Medication:  pantoprazole  (PROTONIX ) 40 MG tablet   Has the patient contacted their pharmacy? Yes (Agent: If no, request that the patient contact the pharmacy for the refill. If patient does not wish to contact the pharmacy document the reason why and proceed with request.) (Agent: If yes, when and what did the pharmacy advise?)  This is the patient's preferred pharmacy:  [facility] [address] [place] - [address] [place] [address] Phone: [phone] Fax: [phone]  Is this the correct pharmacy for this prescription? Yes If no, delete pharmacy and type the correct one.   Has the prescription been filled recently? Yes  Is the patient out of the medication? Yes  Has the patient been seen for an appointment in the last year OR does the patient have an upcoming appointment? Yes  Can we respond through MyChart? Yes  Agent: Please be advised that Rx refills may take up to 3 business days. We ask that you follow-up with your pharmacy.

## 2023-11-12 ENCOUNTER — Ambulatory Visit (INDEPENDENT_AMBULATORY_CARE_PROVIDER_SITE_OTHER): Admitting: Family Medicine

## 2023-11-12 VITALS — BP 120/72 | HR 81 | Wt 157.0 lb

## 2023-11-12 DIAGNOSIS — R350 Frequency of micturition: Secondary | ICD-10-CM

## 2023-11-12 DIAGNOSIS — I25709 Atherosclerosis of coronary artery bypass graft(s), unspecified, with unspecified angina pectoris: Secondary | ICD-10-CM | POA: Diagnosis not present

## 2023-11-12 DIAGNOSIS — L989 Disorder of the skin and subcutaneous tissue, unspecified: Secondary | ICD-10-CM | POA: Diagnosis not present

## 2023-11-12 DIAGNOSIS — F4320 Adjustment disorder, unspecified: Secondary | ICD-10-CM | POA: Diagnosis not present

## 2023-11-12 DIAGNOSIS — R413 Other amnesia: Secondary | ICD-10-CM

## 2023-11-12 DIAGNOSIS — Z8739 Personal history of other diseases of the musculoskeletal system and connective tissue: Secondary | ICD-10-CM | POA: Diagnosis not present

## 2023-11-12 DIAGNOSIS — R238 Other skin changes: Secondary | ICD-10-CM

## 2023-11-12 DIAGNOSIS — M79644 Pain in right finger(s): Secondary | ICD-10-CM

## 2023-11-12 MED ORDER — OXYBUTYNIN CHLORIDE 5 MG PO TABS
5.0000 mg | ORAL_TABLET | Freq: Three times a day (TID) | ORAL | 0 refills | Status: DC | PRN
Start: 2023-11-12 — End: 2023-12-06

## 2023-11-12 NOTE — Patient Instructions (Addendum)
 Keep follow up for labs and appointment with cardiology in September. I did add a magnesium  level to those labs if possible or can have that at our [place] lab as a walk in.   [facility] or xray: Walk in 8:30-4:30 during weekdays, no appointment needed [address]  [place], [place] [address]  For urinary symptoms (which may also be affecting sleep), can try oxybutynin  again - start once per day in evening, then twice or 3 times per day if needed - lowest effective dose and watch for lightheadedness, dizziness or other side effects. Let me know if those occur and will follow up in 1 month. If not improved, may need to see urology. Return to the clinic or go to the nearest emergency room if any of your symptoms worsen or new symptoms occur.  I will refer you to hand specialist for possible contracture of finger or trigger finger.  No new braces or meds for now.  I would like you to see dermatology for the irritated area of the scalp.  Okay to apply topical hydrocortisone  over-the-counter if needed for now.  If dermatology needs a referral, let me know.

## 2023-11-12 NOTE — Progress Notes (Signed)
 Subjective:  Patient ID: Bradley Oconnor, male    DOB: 1940-11-05  Age: 83 y.o. MRN: [id number]  CC:  Chief Complaint  Patient presents with   Medical Management of Chronic Issues    3 Month Follow up for last visit on 07/30/2023   Patient would like to discuss right hand trigger finger and would like to go over frequent urination.     HPI Bradley Oconnor presents for   Hyperlipidemia: With remote history of STEMI, CABG.   Hyperlipidemia treatment discussed at his May visit, some misunderstanding regarding med regimen prior and had been off his rosuvastatin  previously.  Back on meds at his May visit.  LDL still elevated at 141 at that time.  He is followed by cardiology, Dr. Burnard with last visit June 11. Crestor  10 mg daily for lipid management previously, recommended increase to 20 mg at most recent cardiology visit and added Zetia  10 mg daily.  Plan for repeat lipids, CMP, lipoprotein a, and follow-up Doppler study for reassessment of LV function in 3 months.  Still on torsemide  as needed for edema.  Off antihypertensives with stable BP control.edema stable with half pill of torsemide  daily.  On crestor  higher dose since June - no new side effects and taking zetia  10mg  per day.  Appt. With cardiology on 9/26 - labs on 9/19.  Lab Results  Component Value Date   CHOL 225 (H) 07/30/2023   HDL 44.40 07/30/2023   LDLCALC 141 (H) 07/30/2023   TRIG 201.0 (H) 07/30/2023   CHOLHDL 5 07/30/2023   Lab Results  Component Value Date   ALT 18 07/30/2023   AST 22 07/30/2023   ALKPHOS 87 07/30/2023   BILITOT 0.6 07/30/2023    Hypertension: On meds as above previously for hypertension, no current antihypertensives -BP controlled off meds.  He is on potassium supplementation.  And magnesium  supplementation with prior hypomagnesia.  Demadex  20 mg 1/2-1 as needed. Home readings: BP Readings from Last 3 Encounters:  11/12/23 120/72  09/08/23 (!) 113/58  07/30/23 110/62   Lab Results   Component Value Date   CREATININE 1.17 07/30/2023   Lab Results  Component Value Date   K 4.1 07/30/2023  Magnesium  normal at 2.1 in February.  Adjustment disorder, memory impairment Has seen neurology, on Namenda  for memory.  Prior agitation during the day improved with mirtazapine  at night.  Melatonin at bedtime has also been helpful previously.  Option of higher dose of mirtazapine  has been discussed previously. Now on 6mg  melatonin. Abnormal arm movements on 10mg  dose- improved. Still some difficulty staying asleep. Some nocturia - 2-3 times.  Mood doing well overall.   Urinary frequency Going on for awhile. Better in past when taking oxybutynin .  No dysuria/hematuria/fever/abd pain/back pain.  No retention symptoms.   History of pulmonary embolus DVT, PE with COVID infection in 2024.  Eliquis  for anticoagulation. No new bleeding.   Right hand trigger finger Prior trigger thumb on R hand treated by hand specialist requiring surgery. Middle finger has been catching/triggering past 8-10 months.  Tx: heat, cold. Compresses.   Dry scalp at times - improved with new shampoo.   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   Congestive heart failure (HCC) 06/11/2023   Muscle weakness 06/11/2023   Pneumonia 06/11/2023   Coronary arteriosclerosis 06/11/2023   Essential hypertension 06/11/2023  Hyperlipidemia 06/11/2023   Primary stress urinary incontinence 06/11/2023   Cholelithiasis 01/26/2023   Intractable lower abdominal pain 01/26/2023   Prolonged QT interval 12/21/2022   Malnutrition of moderate degree 12/19/2022   Persistent atrial fibrillation (HCC) 12/15/2022   Chronic combined systolic and diastolic CHF (congestive heart failure) (HCC) 12/15/2022   Pressure injury of skin 12/15/2022   Low back pain 08/07/2020   Trigger  finger of right thumb 04/17/2020   Carpal tunnel syndrome, bilateral 09/26/2019   Shoulder pain, bilateral 08/03/2019   Cervicalgia 03/28/2018   GERD (gastroesophageal reflux disease) 04/10/2013   HTN (hypertension) 04/10/2013   CAD (coronary artery disease) of artery bypass graft 10/04/2012   Hypothyroidism 10/04/2012   Hyperlipidemia with target LDL less than 70 10/04/2012   Kidney stones 01/27/2012   Hx of appendectomy-history of ruptured requiring ileocecectomy (~1994) 01/27/2012   Past Medical History:  Diagnosis Date   Blood transfusion without reported diagnosis    CAD (coronary artery disease)    GERD (gastroesophageal reflux disease)    Heart attack (HCC)    Hyperlipidemia    Hypertension 03/07/2012   ECHO-WNL     08/12/11 Lexiscan MyoviewNo significant ischemia demonstrated Low risk scan There is a moderate sized dense scar in the LCX territoy unchanged from the prior study.. Post- stress EF is 40%.   Septic shock (HCC) 12/10/2022   Thyroid  disease    Past Surgical History:  Procedure Laterality Date   APPENDECTOMY     CERVICAL SPINE SURGERY     titanium plate in the back of neck   CORONARY ARTERY BYPASS GRAFT     HERNIA REPAIR     SMALL INTESTINE SURGERY     THYROID  SURGERY     1/2 thyroid  removed on right side   Allergies  Allergen Reactions   Procardia [Nifedipine] Other (See Comments)    Hypotension    Phenergan [Promethazine Hcl] Nausea And Vomiting   Prior to Admission medications   Medication Sig Start Date End Date Taking? Authorizing Provider  acetaminophen  (TYLENOL ) 325 MG tablet Take 650 mg by mouth every 6 (six) hours as needed.   Yes [provider]  apixaban  (ELIQUIS ) 5 MG TABS tablet Take 1 tablet (5 mg total) by mouth 2 (two) times daily. 07/30/23  Yes Levora Reyes SAUNDERS, MD  cyanocobalamin  (VITAMIN B12) 1000 MCG tablet Take 1 tablet by mouth once daily 09/27/23  Yes Levora Reyes SAUNDERS, MD  ezetimibe  (ZETIA ) 10 MG tablet Take 1 tablet (10  mg total) by mouth daily. 09/08/23 12/07/23 Yes Burnard Debby LABOR, MD  folic acid  (FOLVITE ) 1 MG tablet Take 1 tablet (1 mg total) by mouth daily. 03/01/23  Yes Levora Reyes SAUNDERS, MD  magnesium  chloride (SLOW-MAG) 64 MG TBEC SR tablet Take 1 tablet (64 mg total) by mouth daily. 06/01/23  Yes Levora Reyes SAUNDERS, MD  memantine  (NAMENDA ) 5 MG tablet Take 1 tablet (5 mg at night) for 2 weeks, then increase to 1 tablet (5 mg) twice a day 10/14/23  Yes Levora Reyes SAUNDERS, MD  mirtazapine  (REMERON  SOL-TAB) 15 MG disintegrating tablet DISSOLVE 1 TABLET BY MOUTH ONCE DAILY 10/29/23  Yes Levora Reyes SAUNDERS, MD  nitroGLYCERIN  (NITROSTAT ) 0.4 MG SL tablet Place 1 tablet (0.4 mg total) under the tongue every 5 (five) minutes as needed for chest pain. If med is needed, be seen by medical provider immediately. 06/11/23  Yes Levora Reyes SAUNDERS, MD  oxybutynin  (DITROPAN ) 5 MG tablet Take 1 tablet (5 mg total) by mouth every 8 (eight)  hours as needed for bladder spasms. 01/30/23  Yes Jillian Buttery, MD  pantoprazole  (PROTONIX ) 40 MG tablet Take 1 tablet (40 mg total) by mouth daily. 11/05/23  Yes Levora Reyes SAUNDERS, MD  potassium chloride  SA (KLOR-CON  M) 20 MEQ tablet Take 1 tablet (20 mEq total) by mouth daily. 07/30/23  Yes Levora Reyes SAUNDERS, MD  psyllium (METAMUCIL) 58.6 % packet Take 1 packet by mouth daily.   Yes [provider]  rosuvastatin  (CRESTOR ) 20 MG tablet Take 1 tablet (20 mg total) by mouth daily. 10/14/23  Yes Levora Reyes SAUNDERS, MD  senna (SENOKOT) 8.6 MG TABS tablet Take 1 tablet (8.6 mg total) by mouth daily. 01/30/23  Yes Jillian Buttery, MD  torsemide  (DEMADEX ) 20 MG tablet Take 0.5-1 tablets (10-20 mg total) by mouth daily. 10/25/23  Yes Douglass Kenney NOVAK, FNP   Social History   Socioeconomic History   Marital status: Married    Spouse name: Not on file   Number of children: 0   Years of education: 16   Highest education level: Some college, no degree  Occupational History   Occupation: Retired  Tobacco  Use   Smoking status: Never   Smokeless tobacco: Never  Vaping Use   Vaping status: Never Used  Substance and Sexual Activity   Alcohol use: No    Alcohol/week: 0.0 standard drinks of alcohol   Drug use: No   Sexual activity: Never  Other Topics Concern   Not on file  Social History Narrative   Married.    One floor home    Around the clock care   Drinks caffeine   Helayne is the brother   Social Drivers of Corporate investment banker Strain: Low Risk  (07/16/2022)   Overall Financial Resource Strain (CARDIA)    Difficulty of Paying Living Expenses: Not hard at all  Food Insecurity: No Food Insecurity (01/26/2023)   Hunger Vital Sign    Worried About Running Out of Food in the Last Year: Never true    Ran Out of Food in the Last Year: Never true  Transportation Needs: No Transportation Needs (01/26/2023)   PRAPARE - Administrator, Civil Service (Medical): No    Lack of Transportation (Non-Medical): No  Physical Activity: Insufficiently Active (07/16/2022)   Exercise Vital Sign    Days of Exercise per Week: 3 days    Minutes of Exercise per Session: 30 min  Stress: No Stress Concern Present (07/16/2022)   [employer] of Occupational Health - Occupational Stress Questionnaire    Feeling of Stress : Not at all  Social Connections: Moderately Isolated (07/16/2022)   Social Connection and Isolation Panel    Frequency of Communication with Friends and Family: Three times a week    Frequency of Social Gatherings with Friends and Family: Three times a week    Attends Religious Services: Never    Active Member of Clubs or Organizations: No    Attends Banker Meetings: Never    Marital Status: Married  Catering manager Violence: Not At Risk (01/26/2023)   Humiliation, Afraid, Rape, and Kick questionnaire    Fear of Current or Ex-Partner: No    Emotionally Abused: No    Physically Abused: No    Sexually Abused: No    Review of  Systems   Objective:   Vitals:   11/12/23 0959  BP: 120/72  Pulse: 81  SpO2: 97%  Weight: 157 lb (71.2 kg)     Physical Exam Vitals  reviewed.  Constitutional:      Appearance: He is well-developed.  HENT:     Head: Normocephalic and atraumatic.   Eyes:     Pupils: Pupils are equal, round, and reactive to light.  Neck:     Vascular: No carotid bruit or JVD.  Cardiovascular:     Rate and Rhythm: Normal rate and regular rhythm.     Heart sounds: Normal heart sounds. No murmur heard. Pulmonary:     Effort: Pulmonary effort is normal.     Breath sounds: Normal breath sounds. No rales.  Musculoskeletal:     Comments: Right hand, middle finger with discomfort at extension, limited in extension with what appears to be a contracture along the volar hand.  Discomfort into the proximal phalanx, volar aspect.  No soft tissue swelling appreciated.  Neurovascular intact distally.  Skin:    General: Skin is warm and dry.  Neurological:     Mental Status: He is alert and oriented to person, place, and time.     48 minutes spent during visit, including chart review, review of multiple concerns including his insomnia, urinary symptoms, scalp lesion, counseling and assimilation of information, exam, discussion of plan, and chart completion.     Assessment & Plan:  Bradley Oconnor is a 83 y.o. male . Adjustment disorder, unspecified type  - Appears to be stable overall with current med regimen.  No change in mirtazapine .  Sleep difficulties could be related to some of his urinary frequency.  No change in dose of meds, plan for urinary frequency as below.  RTC precautions.  Dry scalp Scalp lesion  - Dry area, scaly area localized as above.  Recommended he follow-up with his dermatologist to evaluate that area for possible biopsy, with differential including squamous cell.  If more diffuse would consider more likely seborrhea.  They will let me know if a referral is needed.  Coronary  artery disease involving coronary bypass graft of native heart with angina pectoris (HCC)  - Asymptomatic, tolerating current med regimen, euvolemic  Hypomagnesemia - Plan: Magnesium   - Prior hypomagnesemia, on supplementation as above, check labs and adjust plan accordingly  Memory impairment  - Overall stable with assistance at home, tolerating current med regimen, continue follow-up with memory specialist.  Urinary frequency - Plan: oxybutynin  (DITROPAN ) 5 MG tablet  - Previously tolerated oxybutynin , low-dose use discussed but also discussed anticholinergic side effects, and risks.  Lowest effective dose, consider different medication or urology eval if persistent symptoms.  Finger pain, right - Plan: Ambulatory referral to Hand Surgery History of trigger finger - Plan: Ambulatory referral to Hand Surgery  - Possible trigger finger but may have component of contracture as well.  Will refer to hand specialist.     Meds ordered this encounter  Medications   oxybutynin  (DITROPAN ) 5 MG tablet    Sig: Take 1 tablet (5 mg total) by mouth every 8 (eight) hours as needed for bladder spasms.    Dispense:  30 tablet    Refill:  0   Patient Instructions  Keep follow up for labs and appointment with cardiology in September. I did add a magnesium  level to those labs if possible or can have that at our [place] lab as a walk in.   [facility] or xray: Walk in 8:30-4:30 during weekdays, no appointment needed [address]  [place], [place] [address]  For urinary symptoms (which may also be affecting sleep), can try oxybutynin  again - start once per  day in evening, then twice or 3 times per day if needed - lowest effective dose and watch for lightheadedness, dizziness or other side effects. Let me know if those occur and will follow up in 1 month. If not improved, may need to see urology. Return to the clinic or go to the nearest emergency room if any of your symptoms worsen or new symptoms  occur.  I will refer you to hand specialist for possible contracture of finger or trigger finger.  No new braces or meds for now.  I would like you to see dermatology for the irritated area of the scalp.  Okay to apply topical hydrocortisone  over-the-counter if needed for now.  If dermatology needs a referral, let me know.     Signed,   Reyes Pines, MD [facility], [facility] 11/12/23 10:50 AM

## 2023-11-14 ENCOUNTER — Encounter: Payer: Self-pay | Admitting: Family Medicine

## 2023-11-22 ENCOUNTER — Other Ambulatory Visit: Payer: Self-pay | Admitting: Family Medicine

## 2023-11-22 DIAGNOSIS — F4320 Adjustment disorder, unspecified: Secondary | ICD-10-CM

## 2023-11-22 MED ORDER — MIRTAZAPINE 15 MG PO TBDP: 15.0000 mg | ORAL_TABLET | Freq: Every day | ORAL | 0 refills | Status: DC

## 2023-11-22 NOTE — Telephone Encounter (Signed)
 Copied from CRM [id number]. Topic: Clinical - Medication Refill >> Nov 22, 2023 11:11 AM Berneda FALCON wrote: Medication: mirtazapine  (REMERON  SOL-TAB) 15 MG disintegrating tablet  Has the patient contacted their pharmacy? No, they make them call us  (Agent: If no, request that the patient contact the pharmacy for the refill. If patient does not wish to contact the pharmacy document the reason why and proceed with request.) (Agent: If yes, when and what did the pharmacy advise?)  This is the patient's preferred pharmacy:  [facility] [address], [place] - [address] [place] [address] Phone: [phone] Fax: [phone]  Is this the correct pharmacy for this prescription? Yes If no, delete pharmacy and type the correct one.   Has the prescription been filled recently? No  Is the patient out of the medication? No  Has the patient been seen for an appointment in the last year OR does the patient have an upcoming appointment? Yes  Can we respond through MyChart? No  Agent: Please be advised that Rx refills may take up to 3 business days. We ask that you follow-up with your pharmacy.

## 2023-12-06 ENCOUNTER — Other Ambulatory Visit: Payer: Self-pay | Admitting: Family Medicine

## 2023-12-06 DIAGNOSIS — R350 Frequency of micturition: Secondary | ICD-10-CM

## 2023-12-06 MED ORDER — PANTOPRAZOLE SODIUM 40 MG PO TBEC: 40.0000 mg | DELAYED_RELEASE_TABLET | Freq: Every day | ORAL | 0 refills | Status: DC

## 2023-12-06 NOTE — Telephone Encounter (Unsigned)
 Copied from CRM #[id number]. Topic: General - Other >> Dec 06, 2023 10:49 AM Carlyon D wrote: Reason for CRM: Luke, states pt is doing good on the oxybutynin  (DITROPAN ) 5 MG tablet. So if PCP would like to refill for pt. Please refill

## 2023-12-06 NOTE — Telephone Encounter (Signed)
 Are you okay with sending in more than a 10 day supply of oxybutynin ?

## 2023-12-06 NOTE — Telephone Encounter (Unsigned)
 Copied from CRM [id number]. Topic: Clinical - Medication Refill >> Dec 06, 2023 10:46 AM Carlyon D wrote: Medication:  pantoprazole  (PROTONIX ) 40 MG tablet   Has the patient contacted their pharmacy? No (Agent: If no, request that the patient contact the pharmacy for the refill. If patient does not wish to contact the pharmacy document the reason why and proceed with request.) (Agent: If yes, when and what did the pharmacy advise?)  This is the patient's preferred pharmacy:  [facility] [address] [place] - [address] [place] [address] Phone: [phone] Fax: [phone]  Is this the correct pharmacy for this prescription? Yes If no, delete pharmacy and type the correct one.   Has the prescription been filled recently? No  Is the patient out of the medication? No  Has the patient been seen for an appointment in the last year OR does the patient have an upcoming appointment? Yes  Can we respond through MyChart? No  Agent: Please be advised that Rx refills may take up to 3 business days. We ask that you follow-up with your pharmacy.

## 2023-12-07 MED ORDER — OXYBUTYNIN CHLORIDE 5 MG PO TABS: 5.0000 mg | ORAL_TABLET | Freq: Three times a day (TID) | ORAL | 0 refills | Status: DC | PRN

## 2023-12-07 NOTE — Telephone Encounter (Signed)
Refilled with larger quantity.

## 2023-12-10 ENCOUNTER — Encounter: Payer: Self-pay | Admitting: Orthopaedic Surgery

## 2023-12-10 ENCOUNTER — Ambulatory Visit: Admitting: Orthopaedic Surgery

## 2023-12-10 ENCOUNTER — Other Ambulatory Visit

## 2023-12-10 DIAGNOSIS — I5022 Chronic systolic (congestive) heart failure: Secondary | ICD-10-CM | POA: Diagnosis not present

## 2023-12-10 DIAGNOSIS — M65331 Trigger finger, right middle finger: Secondary | ICD-10-CM | POA: Diagnosis not present

## 2023-12-10 DIAGNOSIS — I2510 Atherosclerotic heart disease of native coronary artery without angina pectoris: Secondary | ICD-10-CM | POA: Diagnosis not present

## 2023-12-10 MED ORDER — METHYLPREDNISOLONE ACETATE 40 MG/ML IJ SUSP
13.3300 mg | INTRAMUSCULAR | Status: AC | PRN
  Administered 2023-12-10: 13.33 mg

## 2023-12-10 MED ORDER — BUPIVACAINE HCL 0.5 % IJ SOLN
0.3300 mL | INTRAMUSCULAR | Status: AC | PRN
  Administered 2023-12-10: .33 mL

## 2023-12-10 MED ORDER — LIDOCAINE HCL 1 % IJ SOLN
0.3000 mL | INTRAMUSCULAR | Status: AC | PRN
  Administered 2023-12-10: .3 mL

## 2023-12-10 NOTE — Progress Notes (Signed)
 Office Visit Note   Patient: Bradley Oconnor           Date of Birth: Nov 17, 1940           MRN: [id number] Visit Date: 12/10/2023              Requested by: Levora Reyes SAUNDERS, MD [address] [place],  [place] [address] PCP: Levora Reyes SAUNDERS, MD   Assessment & Plan: Visit Diagnoses:  1. Trigger finger, right middle finger     Plan: History of Present Illness Bradley Oconnor is an 83 year old male who presents with a right middle finger trigger finger. He is accompanied by his brother, Sheena, and his caregiver, Velinda.  He experiences intermittent locking of the right middle finger but cannot reproduce the locking on command. He previously underwent surgery for a similar condition at a different facility.  He has significant weakness and deconditioning following a severe COVID pneumonia episode a year ago, which required hospitalization and intensive care. He is currently unable to walk and uses a wheelchair.  He has difficulty rising from a chair and is interested in exercises to strengthen his muscles and reduce pain.  Physical Exam MUSCULOSKELETAL: Right middle finger consistent with triggering.  Assessment and Plan Right middle finger trigger finger Chronic condition with episodes of catching and locking due to tendon swelling. Previous surgery noted. Current episode lacks significant tenderness. - Administer corticosteroid injection to reduce inflammation and facilitate tendon movement. - Recommend wearing a finger splint at night for 1-2 weeks to prevent locking.  Follow-Up Instructions: No follow-ups on file.   Orders:  No orders of the defined types were placed in this encounter.  No orders of the defined types were placed in this encounter.     Procedures: Hand/UE Inj: R long A1 for trigger finger on 12/10/2023 9:27 AM Indications: pain Details: 25 G needle Medications: 0.3 mL lidocaine  1 %; 0.33 mL bupivacaine  0.5 %; 13.33 mg methylPREDNISolone  acetate  40 MG/ML Outcome: tolerated well, no immediate complications Consent was given by the patient. Patient was prepped and draped in the usual sterile fashion.       Clinical Data: No additional findings.   Subjective: Chief Complaint  Patient presents with   Right Hand - Pain    Middle finger    HPI  Review of Systems  Constitutional: Negative.   HENT: Negative.    Eyes: Negative.   Respiratory: Negative.    Cardiovascular: Negative.   Gastrointestinal: Negative.   Endocrine: Negative.   Genitourinary: Negative.   Skin: Negative.   Allergic/Immunologic: Negative.   Neurological: Negative.   Hematological: Negative.   Psychiatric/Behavioral: Negative.    All other systems reviewed and are negative.    Objective: Vital Signs: There were no vitals taken for this visit.  Physical Exam Vitals and nursing note reviewed.  Constitutional:      Appearance: He is well-developed.  HENT:     Head: Normocephalic and atraumatic.  Eyes:     Pupils: Pupils are equal, round, and reactive to light.  Pulmonary:     Effort: Pulmonary effort is normal.  Abdominal:     Palpations: Abdomen is soft.  Musculoskeletal:        General: Normal range of motion.     Cervical back: Neck supple.  Skin:    General: Skin is warm.  Neurological:     Mental Status: He is alert and oriented to person, place, and time.  Psychiatric:  Behavior: Behavior normal.        Thought Content: Thought content normal.        Judgment: Judgment normal.     Ortho Exam  Specialty Comments:  No specialty comments available.  Imaging: No results found.   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/2023   Congestive heart failure (HCC) 06/11/2023   Muscle weakness 06/11/2023   Pneumonia 06/11/2023   Coronary arteriosclerosis 06/11/2023    Essential hypertension 06/11/2023   Hyperlipidemia 06/11/2023   Primary stress urinary incontinence 06/11/2023   Cholelithiasis 01/26/2023   Intractable lower abdominal pain 01/26/2023   Prolonged QT interval 12/21/2022   Malnutrition of moderate degree 12/19/2022   Persistent atrial fibrillation (HCC) 12/15/2022   Chronic combined systolic and diastolic CHF (congestive heart failure) (HCC) 12/15/2022   Pressure injury of skin 12/15/2022   Low back pain 08/07/2020   Trigger finger of right thumb 04/17/2020   Carpal tunnel syndrome, bilateral 09/26/2019   Shoulder pain, bilateral 08/03/2019   Cervicalgia 03/28/2018   GERD (gastroesophageal reflux disease) 04/10/2013   HTN (hypertension) 04/10/2013   CAD (coronary artery disease) of artery bypass graft 10/04/2012   Hypothyroidism 10/04/2012   Hyperlipidemia with target LDL less than 70 10/04/2012   Kidney stones 01/27/2012   Hx of appendectomy-history of ruptured requiring ileocecectomy (~1994) 01/27/2012   Past Medical History:  Diagnosis Date   Blood transfusion without reported diagnosis    CAD (coronary artery disease)    GERD (gastroesophageal reflux disease)    Heart attack (HCC)    Hyperlipidemia    Hypertension 03/07/2012   ECHO-WNL     08/12/11 Lexiscan MyoviewNo significant ischemia demonstrated Low risk scan There is a moderate sized dense scar in the LCX territoy unchanged from the prior study.. Post- stress EF is 40%.   Septic shock (HCC) 12/10/2022   Thyroid  disease     Family History  Problem Relation Age of Onset   Heart disease Mother    Cancer Mother        breast, stomach   Heart disease Father    Hyperlipidemia Father    Heart disease Brother    Diabetes Brother    Hyperlipidemia Brother    Heart disease Paternal Tree surgeon     Past Surgical History:  Procedure Laterality Date   APPENDECTOMY     CERVICAL SPINE SURGERY     titanium plate in the back of neck   CORONARY ARTERY  BYPASS GRAFT     HERNIA REPAIR     SMALL INTESTINE SURGERY     THYROID  SURGERY     1/2 thyroid  removed on right side   Social History   Occupational History   Occupation: Retired  Tobacco Use   Smoking status: Never   Smokeless tobacco: Never  Vaping Use   Vaping status: Never Used  Substance and Sexual Activity   Alcohol use: No    Alcohol/week: 0.0 standard drinks of alcohol   Drug use: No   Sexual activity: Never

## 2023-12-12 LAB — LIPID PANEL
Chol/HDL Ratio: 3 ratio (ref 0.0–5.0)
Cholesterol, Total: 148 mg/dL (ref 100–199)
HDL: 50 mg/dL (ref >39)
LDL Chol Calc (NIH): 77 mg/dL (ref 0–99)
Triglycerides: 119 mg/dL (ref 0–149)
VLDL Cholesterol Cal: 21 mg/dL (ref 5–40)

## 2023-12-12 LAB — COMPREHENSIVE METABOLIC PANEL WITH GFR
ALT: 35 IU/L (ref 0–44)
AST: 41 IU/L — ABNORMAL HIGH (ref 0–40)
Albumin: 4.2 g/dL (ref 3.7–4.7)
Alkaline Phosphatase: 98 IU/L (ref 44–121)
BUN/Creatinine Ratio: 18 (ref 10–24)
BUN: 24 mg/dL (ref 8–27)
Bilirubin Total: 0.6 mg/dL (ref 0.0–1.2)
CO2: 22 mmol/L (ref 20–29)
Calcium: 9.5 mg/dL (ref 8.6–10.2)
Chloride: 102 mmol/L (ref 96–106)
Creatinine, Ser: 1.37 mg/dL — ABNORMAL HIGH (ref 0.76–1.27)
Globulin, Total: 3.3 g/dL (ref 1.5–4.5)
Glucose: 111 mg/dL — ABNORMAL HIGH (ref 70–99)
Potassium: 4.6 mmol/L (ref 3.5–5.2)
Sodium: 142 mmol/L (ref 134–144)
Total Protein: 7.5 g/dL (ref 6.0–8.5)
eGFR: 51 mL/min/1.73 — ABNORMAL LOW (ref >59)

## 2023-12-12 LAB — LIPOPROTEIN A (LPA): Lipoprotein (a): 200.4 nmol/L — AB (ref <75.0)

## 2023-12-13 ENCOUNTER — Ambulatory Visit: Admitting: Family Medicine

## 2023-12-15 ENCOUNTER — Telehealth: Payer: Self-pay

## 2023-12-15 NOTE — Telephone Encounter (Signed)
 FYI from caregiver Suzen: Patient has been doing great with Oxybutin and they look forward to continued improvement   No action required

## 2023-12-18 ENCOUNTER — Ambulatory Visit: Payer: Self-pay | Admitting: Internal Medicine

## 2023-12-20 ENCOUNTER — Other Ambulatory Visit: Payer: Self-pay | Admitting: Family Medicine

## 2023-12-20 DIAGNOSIS — F4320 Adjustment disorder, unspecified: Secondary | ICD-10-CM

## 2023-12-20 MED ORDER — MIRTAZAPINE 15 MG PO TBDP: 15.0000 mg | ORAL_TABLET | Freq: Every day | ORAL | 0 refills | Status: DC

## 2023-12-20 NOTE — Telephone Encounter (Signed)
 Copied from CRM #[id number]. Topic: Clinical - Medication Refill >> Dec 20, 2023  9:34 AM [place] E wrote: Medication: mirtazapine  (REMERON  SOL-TAB) 15 MG disintegrating tablet  Has the patient contacted their pharmacy? No (Agent: If no, request that the patient contact the pharmacy for the refill. If patient does not wish to contact the pharmacy document the reason why and proceed with request.) (Agent: If yes, when and what did the pharmacy advise?)  This is the patient's preferred pharmacy:  [facility] [address] [place] - [address] [place] [address] Phone: [phone] Fax: [phone]  Is this the correct pharmacy for this prescription? Yes If no, delete pharmacy and type the correct one.   Has the prescription been filled recently? No  Is the patient out of the medication? No, one week left.  Has the patient been seen for an appointment in the last year OR does the patient have an upcoming appointment? Yes  Can we respond through MyChart? Yes  Agent: Please be advised that Rx refills may take up to 3 business days. We ask that you follow-up with your pharmacy.

## 2023-12-24 ENCOUNTER — Ambulatory Visit: Admitting: Nurse Practitioner

## 2023-12-28 ENCOUNTER — Other Ambulatory Visit: Payer: Self-pay | Admitting: Family Medicine

## 2023-12-28 ENCOUNTER — Telehealth: Payer: Self-pay | Admitting: Physician Assistant

## 2023-12-28 MED ORDER — VITAMIN B-12 1000 MCG PO TABS: 1000.0000 ug | ORAL_TABLET | Freq: Every day | ORAL | 1 refills | Status: AC

## 2023-12-28 NOTE — Telephone Encounter (Signed)
 Suzen called in this afternoon and she stated that Pt is during well. Thanks

## 2023-12-28 NOTE — Telephone Encounter (Signed)
 Looking at patient's chart from 8/15 visit I do not see that they needed to follow up. Patient's daughter is wondering if appointment for 10/03 is still needed?

## 2023-12-28 NOTE — Telephone Encounter (Signed)
 Unsure if its okay to refill. Patient has not had B12 labs since Feb 2025

## 2023-12-28 NOTE — Telephone Encounter (Signed)
 Copied from CRM #[id number]. Topic: Appointments - Scheduling Inquiry for Clinic >> Dec 28, 2023  2:45 PM Shereese L wrote: Reason for CRM: patient daughter is calling to confirm if her dad needs to keep the appt or if it can be rescheduled Please call daughter [phone] Lenis Arts)

## 2023-12-28 NOTE — Telephone Encounter (Unsigned)
 Copied from CRM #[id number]. Topic: Clinical - Medication Refill >> Dec 28, 2023 11:33 AM [place] M wrote: Medication: cyanocobalamin  (VITAMIN B12) 1000 MCG tablet  Has the patient contacted their pharmacy? Yes (Agent: If no, request that the patient contact the pharmacy for the refill. If patient does not wish to contact the pharmacy document the reason why and proceed with request.) (Agent: If yes, when and what did the pharmacy advise?)  This is the patient's preferred pharmacy:  [facility] [place] [address] [place] - [address] [place] [address] Phone: [phone] Fax: [phone]  Is this the correct pharmacy for this prescription? Yes If no, delete pharmacy and type the correct one.   Has the prescription been filled recently? Yes  Is the patient out of the medication? Yes  Has the patient been seen for an appointment in the last year OR does the patient have an upcoming appointment? Yes  Can we respond through MyChart? Yes  Agent: Please be advised that Rx refills may take up to 3 business days. We ask that you follow-up with your pharmacy.

## 2023-12-28 NOTE — Telephone Encounter (Signed)
 Called patient's daughter to let her know to keep appointment and Vitamin B12 was sent to pharmacy as well. Left VM to return call with any questions or concerns.

## 2023-12-28 NOTE — Telephone Encounter (Signed)
 Will refill B12, planned on follow-up to discuss urinary symptoms and med from August.  Please keep appointment October 3 if possible.

## 2023-12-31 ENCOUNTER — Encounter: Payer: Self-pay | Admitting: Family Medicine

## 2023-12-31 ENCOUNTER — Ambulatory Visit (INDEPENDENT_AMBULATORY_CARE_PROVIDER_SITE_OTHER): Admitting: Family Medicine

## 2023-12-31 DIAGNOSIS — R7989 Other specified abnormal findings of blood chemistry: Secondary | ICD-10-CM | POA: Diagnosis not present

## 2023-12-31 DIAGNOSIS — R350 Frequency of micturition: Secondary | ICD-10-CM | POA: Diagnosis not present

## 2023-12-31 LAB — BASIC METABOLIC PANEL WITH GFR
BUN: 27 mg/dL — ABNORMAL HIGH (ref 6–23)
CO2: 29 meq/L (ref 19–32)
Calcium: 9.8 mg/dL (ref 8.4–10.5)
Chloride: 99 meq/L (ref 96–112)
Creatinine, Ser: 1.48 mg/dL (ref 0.40–1.50)
GFR: 43.21 mL/min — ABNORMAL LOW (ref >60.00)
Glucose, Bld: 97 mg/dL (ref 70–99)
Potassium: 4.9 meq/L (ref 3.5–5.1)
Sodium: 136 meq/L (ref 135–145)

## 2023-12-31 LAB — MAGNESIUM: Magnesium: 2 mg/dL (ref 1.5–2.5)

## 2023-12-31 MED ORDER — OXYBUTYNIN CHLORIDE 5 MG PO TABS
5.0000 mg | ORAL_TABLET | Freq: Two times a day (BID) | ORAL | 1 refills | Status: AC
Start: 2023-12-31 — End: ?

## 2023-12-31 NOTE — Progress Notes (Signed)
 Subjective:  Patient ID: Bradley Oconnor, male    DOB: Sep 26, 1940  Age: 83 y.o. MRN: [id number]  CC:  Chief Complaint  Patient presents with   Follow-up    1 month follow up. Went to Dr. Jerri and had an injection middle finger trigger. Oxybutynin  is working for him , 1 tab in the morning and 1 tab at night.     HPI Bradley Oconnor presents for follow-up from August visit  Urinary frequency, nocturia Symptoms have been present for a while, denied any dysuria hematuria fever abdominal pain or back pain and denied any retention symptoms at his August 15 visit.  He had taken oxybutynin  previously with relief of symptoms.  We discussed medication options including potential risks and side effects, but decided to restart oxybutynin  at lowest effective dose -initially starting in the evening.  5 mg every 8 hours as needed, has been taking twice per day. Doing better- taking once in morning and in evening - less frequent urination. No retention symptoms, no side effects. No falls/dizziness/lightheadedness. Sleep is better with less nocturia.   Right hand trigger finger Evaluated at his August visit.  Catching/triggering of the middle finger for the previous 8 to 10 months.  Referred to hand specialist and received an injection by Dr. Jerri on 9/12, with splint applied. This has had minimal improvement - plans on calling ortho for plan.    Hypomagnesemia Treated with magnesium  supplement.  Plan for labs with cardiology last month.  Does not look like magnesium  was obtained at that time. Most recent level of 2.1 in February, 1.5 in January, 1.3 last December. On magnesium  supplement OTC BID, no diarrhea.  Slight incr in his creatinine noted on labs 9/12. Creatinine 1.37, prior 1.17, 1.10, 1.35  Hyperlipidemia Labs noted with cardiology last month, recommended starting Repatha 140 mg every 14 days to lower cholesterol and lipoprotein A.  Plan to discuss with Damien at his October appointment. Plans to  discuss with Damien on 10/16.   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   Congestive heart failure (HCC) 06/11/2023   Muscle weakness 06/11/2023   Pneumonia 06/11/2023   Coronary arteriosclerosis 06/11/2023   Essential hypertension 06/11/2023   Hyperlipidemia 06/11/2023   Primary stress urinary incontinence 06/11/2023   Cholelithiasis 01/26/2023   Intractable lower abdominal pain 01/26/2023   Prolonged QT interval 12/21/2022   Malnutrition of moderate degree 12/19/2022   Persistent atrial fibrillation (HCC) 12/15/2022   Chronic combined systolic and diastolic CHF (congestive heart failure) (HCC) 12/15/2022   Pressure injury of skin 12/15/2022   Low back pain 08/07/2020   Trigger finger of right thumb 04/17/2020   Carpal tunnel syndrome, bilateral 09/26/2019   Shoulder pain, bilateral 08/03/2019   Cervicalgia 03/28/2018   GERD (gastroesophageal reflux disease) 04/10/2013   HTN (hypertension) 04/10/2013   CAD (coronary artery disease) of artery bypass graft 10/04/2012   Hypothyroidism 10/04/2012   Hyperlipidemia with target LDL less than 70 10/04/2012   Kidney stones 01/27/2012   Hx of appendectomy-history of ruptured requiring ileocecectomy (~1994) 01/27/2012   Past Medical History:  Diagnosis Date   Blood transfusion without reported diagnosis    CAD (coronary artery disease)    GERD (gastroesophageal reflux disease)    Heart attack (HCC)    Hyperlipidemia    Hypertension 03/07/2012   ECHO-WNL     08/12/11  Lexiscan MyoviewNo significant ischemia demonstrated Low risk scan There is a moderate sized dense scar in the LCX territoy unchanged from the prior study.. Post- stress EF is 40%.   Septic shock (HCC) 12/10/2022   Thyroid  disease    Past Surgical History:  Procedure Laterality Date   APPENDECTOMY     CERVICAL SPINE  SURGERY     titanium plate in the back of neck   CORONARY ARTERY BYPASS GRAFT     HERNIA REPAIR     SMALL INTESTINE SURGERY     THYROID  SURGERY     1/2 thyroid  removed on right side   Allergies  Allergen Reactions   Procardia [Nifedipine] Other (See Comments)    Hypotension    Phenergan [Promethazine Hcl] Nausea And Vomiting   Prior to Admission medications   Medication Sig Start Date End Date Taking? Authorizing Provider  acetaminophen  (TYLENOL ) 325 MG tablet Take 650 mg by mouth every 6 (six) hours as needed.   Yes [provider]  apixaban  (ELIQUIS ) 5 MG TABS tablet Take 1 tablet (5 mg total) by mouth 2 (two) times daily. 07/30/23  Yes Levora Reyes SAUNDERS, MD  cyanocobalamin  (VITAMIN B12) 1000 MCG tablet Take 1 tablet (1,000 mcg total) by mouth daily. 12/28/23  Yes Levora Reyes SAUNDERS, MD  docusate sodium  (COLACE) 50 MG capsule Take 50 mg by mouth daily.   Yes [provider]  ezetimibe  (ZETIA ) 10 MG tablet Take 1 tablet (10 mg total) by mouth daily. 09/08/23 12/31/23 Yes Burnard Debby LABOR, MD  folic acid  (FOLVITE ) 1 MG tablet Take 1 tablet (1 mg total) by mouth daily. 03/01/23  Yes Levora Reyes SAUNDERS, MD  magnesium  chloride (SLOW-MAG) 64 MG TBEC SR tablet Take 1 tablet (64 mg total) by mouth daily. 06/01/23  Yes Levora Reyes SAUNDERS, MD  memantine  (NAMENDA ) 5 MG tablet Take 1 tablet (5 mg at night) for 2 weeks, then increase to 1 tablet (5 mg) twice a day 10/14/23  Yes Levora Reyes SAUNDERS, MD  mirtazapine  (REMERON  SOL-TAB) 15 MG disintegrating tablet Take 1 tablet (15 mg total) by mouth at bedtime. 12/20/23  Yes Levora Reyes SAUNDERS, MD  nitroGLYCERIN  (NITROSTAT ) 0.4 MG SL tablet Place 1 tablet (0.4 mg total) under the tongue every 5 (five) minutes as needed for chest pain. If med is needed, be seen by medical provider immediately. 06/11/23  Yes Levora Reyes SAUNDERS, MD  oxybutynin  (DITROPAN ) 5 MG tablet Take 1 tablet (5 mg total) by mouth every 8 (eight) hours as needed for bladder  spasms. Patient taking differently: Take 5 mg by mouth 2 (two) times daily. 12/07/23  Yes Levora Reyes SAUNDERS, MD  pantoprazole  (PROTONIX ) 40 MG tablet Take 1 tablet (40 mg total) by mouth daily. 12/06/23  Yes Levora Reyes SAUNDERS, MD  potassium chloride  SA (KLOR-CON  M) 20 MEQ tablet Take 1 tablet (20 mEq total) by mouth daily. 07/30/23  Yes Levora Reyes SAUNDERS, MD  rosuvastatin  (CRESTOR ) 20 MG tablet Take 1 tablet (20 mg total) by mouth daily. 10/14/23  Yes Levora Reyes SAUNDERS, MD  torsemide  (DEMADEX ) 20 MG tablet Take 0.5-1 tablets (10-20 mg total) by mouth daily. 10/25/23  Yes Webb, Padonda B, FNP  psyllium (METAMUCIL) 58.6 % packet Take 1 packet by mouth daily. Patient not taking: Reported on 12/31/2023    [provider]  senna (SENOKOT) 8.6 MG TABS tablet Take 1 tablet (8.6 mg total) by mouth daily. Patient not taking: Reported on 12/31/2023 01/30/23   Jillian Buttery, MD   Social History  Socioeconomic History   Marital status: Married    Spouse name: Not on file   Number of children: 0   Years of education: 56   Highest education level: Some college, no degree  Occupational History   Occupation: Retired  Tobacco Use   Smoking status: Never   Smokeless tobacco: Never  Vaping Use   Vaping status: Never Used  Substance and Sexual Activity   Alcohol use: No    Alcohol/week: 0.0 standard drinks of alcohol   Drug use: No   Sexual activity: Never  Other Topics Concern   Not on file  Social History Narrative   Married.    One floor home    Around the clock care   Drinks caffeine   Helayne is the brother   Social Drivers of Corporate investment banker Strain: Low Risk  (07/16/2022)   Overall Financial Resource Strain (CARDIA)    Difficulty of Paying Living Expenses: Not hard at all  Food Insecurity: No Food Insecurity (01/26/2023)   Hunger Vital Sign    Worried About Running Out of Food in the Last Year: Never true    Ran Out of Food in the Last Year: Never true  Transportation  Needs: No Transportation Needs (01/26/2023)   PRAPARE - Administrator, Civil Service (Medical): No    Lack of Transportation (Non-Medical): No  Physical Activity: Insufficiently Active (07/16/2022)   Exercise Vital Sign    Days of Exercise per Week: 3 days    Minutes of Exercise per Session: 30 min  Stress: No Stress Concern Present (07/16/2022)   [employer] of Occupational Health - Occupational Stress Questionnaire    Feeling of Stress : Not at all  Social Connections: Moderately Isolated (07/16/2022)   Social Connection and Isolation Panel    Frequency of Communication with Friends and Family: Three times a week    Frequency of Social Gatherings with Friends and Family: Three times a week    Attends Religious Services: Never    Active Member of Clubs or Organizations: No    Attends Banker Meetings: Never    Marital Status: Married  Catering manager Violence: Not At Risk (01/26/2023)   Humiliation, Afraid, Rape, and Kick questionnaire    Fear of Current or Ex-Partner: No    Emotionally Abused: No    Physically Abused: No    Sexually Abused: No    Review of Systems Per HPI Objective:   Vitals:   12/31/23 1046  BP: 118/78  Pulse: 74  Resp: 16  Temp: 98.3 F (36.8 C)  TempSrc: Temporal  SpO2: 98%  Weight: 157 lb (71.2 kg)  Height: 5' 7 (1.702 m)     Physical Exam Vitals reviewed.  Constitutional:      Appearance: He is well-developed.  HENT:     Head: Normocephalic and atraumatic.  Neck:     Vascular: No carotid bruit or JVD.  Cardiovascular:     Rate and Rhythm: Normal rate and regular rhythm.     Heart sounds: Normal heart sounds. No murmur heard. Pulmonary:     Effort: Pulmonary effort is normal.     Breath sounds: Normal breath sounds. No rales.  Musculoskeletal:     Right lower leg: No edema.     Left lower leg: No edema.  Skin:    General: Skin is warm and dry.  Neurological:     Mental Status: He is alert and  oriented to person, place, and time.  Psychiatric:        Mood and Affect: Mood normal.        Assessment & Plan:  Bradley Oconnor is a 83 y.o. male . Hypomagnesemia - Plan: Magnesium   - On over-the-counter supplement as above, reading was normal earlier this year.  Check updated level and adjust regimen accordingly.  Elevated serum creatinine - Plan: Basic metabolic panel with GFR  - Noted on recent labs at cardiology.  Slight increase from previous baseline but was in the 1.35 range earlier in the year.  He does note he is staying hydrated, no new NSAIDs, check updated labs for stability.  No new meds for now.  Urinary frequency  - Improved with use of oxybutynin  without any side effects, and has improved nocturia.  Continue same.  No orders of the defined types were placed in this encounter.  Patient Instructions  Thank you for coming in today. No change in medications at this time. If there are any concerns on your bloodwork, I will let you know. Take care!     Signed,   Reyes Pines, MD  Primary Care, [facility] 12/31/23 11:31 AM

## 2023-12-31 NOTE — Patient Instructions (Signed)
 Thank you for coming in today. No change in medications at this time. If there are any concerns on your bloodwork, I will let you know. Take care!

## 2024-01-07 ENCOUNTER — Ambulatory Visit: Payer: Self-pay | Admitting: Family Medicine

## 2024-01-09 ENCOUNTER — Other Ambulatory Visit: Payer: Self-pay | Admitting: Family Medicine

## 2024-01-10 ENCOUNTER — Telehealth: Payer: Self-pay

## 2024-01-10 NOTE — Telephone Encounter (Signed)
 Patient is having hard stools from medication. Okay to advise to take stool softer to help?   Copied from CRM [id number]. Topic: Clinical - Medical Advice >> Jan 10, 2024  1:42 PM Bradley Oconnor wrote: Bradley Oconnor called on behalf of Bradley Oconnor, states his Oxybutynin  (DITROPAN ) 5 MG is causing his stool to become hard. No constipation but it is making his stool hard. Please call back and advise on what to do, states it's ok to leave detailed voicemail.

## 2024-01-10 NOTE — Telephone Encounter (Signed)
 Stool softener such as Colace is fine  -that is on his med list, has he been taking it?  Can increase to 100 mg if needed.  Or if he does have constipation MiraLAX  over-the-counter.  Let me know if symptoms do not improve with this approach.

## 2024-01-11 NOTE — Telephone Encounter (Signed)
Bradley Oconnor has been notified.

## 2024-01-13 ENCOUNTER — Ambulatory Visit: Attending: Cardiology | Admitting: Nurse Practitioner

## 2024-01-13 ENCOUNTER — Encounter: Payer: Self-pay | Admitting: Nurse Practitioner

## 2024-01-13 ENCOUNTER — Other Ambulatory Visit: Payer: Self-pay | Admitting: Family Medicine

## 2024-01-13 VITALS — BP 98/66 | HR 91 | Ht 67.0 in | Wt 157.6 lb

## 2024-01-13 DIAGNOSIS — I10 Essential (primary) hypertension: Secondary | ICD-10-CM

## 2024-01-13 DIAGNOSIS — I77810 Thoracic aortic ectasia: Secondary | ICD-10-CM | POA: Diagnosis not present

## 2024-01-13 DIAGNOSIS — Z86711 Personal history of pulmonary embolism: Secondary | ICD-10-CM | POA: Diagnosis not present

## 2024-01-13 DIAGNOSIS — I351 Nonrheumatic aortic (valve) insufficiency: Secondary | ICD-10-CM

## 2024-01-13 DIAGNOSIS — Z86718 Personal history of other venous thrombosis and embolism: Secondary | ICD-10-CM

## 2024-01-13 DIAGNOSIS — F4320 Adjustment disorder, unspecified: Secondary | ICD-10-CM

## 2024-01-13 DIAGNOSIS — I2510 Atherosclerotic heart disease of native coronary artery without angina pectoris: Secondary | ICD-10-CM | POA: Diagnosis not present

## 2024-01-13 DIAGNOSIS — I5022 Chronic systolic (congestive) heart failure: Secondary | ICD-10-CM | POA: Diagnosis not present

## 2024-01-13 DIAGNOSIS — I340 Nonrheumatic mitral (valve) insufficiency: Secondary | ICD-10-CM | POA: Diagnosis not present

## 2024-01-13 DIAGNOSIS — E785 Hyperlipidemia, unspecified: Secondary | ICD-10-CM | POA: Diagnosis not present

## 2024-01-13 DIAGNOSIS — I480 Paroxysmal atrial fibrillation: Secondary | ICD-10-CM | POA: Diagnosis not present

## 2024-01-13 NOTE — Progress Notes (Signed)
 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   Past Medical History    Past Medical History:  Diagnosis Date   Blood transfusion without reported diagnosis    CAD (coronary artery disease)    GERD (gastroesophageal reflux disease)    Heart attack (HCC)    Hyperlipidemia    Hypertension 03/07/2012   ECHO-WNL     08/12/11 Lexiscan MyoviewNo significant ischemia demonstrated Low risk scan There is a moderate sized dense scar in the LCX territoy unchanged from the prior study.. Post- stress EF is 40%.   Septic shock (HCC) 12/10/2022   Thyroid  disease    Past Surgical History:  Procedure Laterality Date   APPENDECTOMY     CERVICAL SPINE SURGERY     titanium plate in the back of neck   CORONARY ARTERY BYPASS GRAFT     HERNIA REPAIR     SMALL INTESTINE SURGERY     THYROID  SURGERY     1/2 thyroid  removed on right side    Allergies  Allergies  Allergen Reactions   Procardia [Nifedipine] Other (See Comments)    Hypotension    Phenergan [Promethazine Hcl] Nausea And Vomiting     Labs/Other Studies Reviewed    The following studies were reviewed today:  Cardiac Studies & Procedures   ______________________________________________________________________________________________     ECHOCARDIOGRAM  ECHOCARDIOGRAM COMPLETE 02/19/2023  Narrative ECHOCARDIOGRAM REPORT    Patient Name:   ROWDY GUERRINI Date of Exam:  02/19/2023 Medical Rec #:  [id number]        Height:       69.0 in Accession #:    [id number]       Weight:       138.0 lb Date of Birth:  11/30/1940        BSA:          1.765 m Patient Age:    82 years         BP:           110/68 mmHg Patient Gender: M                HR:           75 bpm. Exam Location:  [address]  Procedure: 2D Echo, Cardiac Doppler and Color Doppler  Indications:    I50.22 Chronic systolic heart failure  History:        Patient has prior history of Echocardiogram examinations, most recent 12/10/2019. CAD; Risk Factors:Hypertension and Dyslipidemia.  Sonographer:    NaTashia Rodgers-Jones RDCS Referring Phys: [id number] Zadkiel Dragan C Khalil Belote  IMPRESSIONS   1. Left ventricular ejection fraction, by estimation, is 25 to 30%. The left ventricle has severely decreased function. The left ventricle demonstrates global hypokinesis. Left ventricular diastolic parameters are consistent with Grade I diastolic dysfunction (impaired relaxation). 2. Right ventricular systolic function is normal. The right ventricular size is normal. 3. Left atrial size was mildly dilated. 4. The mitral valve is normal in structure. Mild mitral valve regurgitation. No evidence of mitral  stenosis. 5. The aortic valve is tricuspid. Aortic valve regurgitation is moderate. Aortic valve sclerosis/calcification is present, without any evidence of aortic stenosis. 6. Pulmonic valve regurgitation is moderate. 7. Aortic dilatation noted. There is borderline dilatation of the aortic root, measuring 39 mm. There is mild dilatation of the ascending aorta, measuring 41 mm. 8. The inferior vena cava is normal in size with greater than 50% respiratory variability, suggesting right atrial pressure of 3 mmHg.  FINDINGS Left Ventricle: Left ventricular ejection fraction, by estimation, is 25 to 30%. The left ventricle has severely decreased function. The left ventricle demonstrates global hypokinesis. The left ventricular  internal cavity size was normal in size. There is no left ventricular hypertrophy. Left ventricular diastolic parameters are consistent with Grade I diastolic dysfunction (impaired relaxation).  Right Ventricle: The right ventricular size is normal. Right ventricular systolic function is normal.  Left Atrium: Left atrial size was mildly dilated.  Right Atrium: Right atrial size was normal in size.  Pericardium: There is no evidence of pericardial effusion.  Mitral Valve: The mitral valve is normal in structure. Mild mitral valve regurgitation. No evidence of mitral valve stenosis.  Tricuspid Valve: The tricuspid valve is normal in structure. Tricuspid valve regurgitation is mild . No evidence of tricuspid stenosis.  The aortic valve is tricuspid. Aortic valve regurgitation is moderate. Aortic valve sclerosis/calcification is present, without any evidence of aortic stenosis. Pulmonic Valve: The pulmonic valve was normal in structure. Pulmonic valve regurgitation is moderate. No evidence of pulmonic stenosis.  Aorta: Aortic dilatation noted. There is borderline dilatation of the aortic root, measuring 39 mm. There is mild dilatation of the ascending aorta, measuring 41 mm.  Venous: The inferior vena cava is normal in size with greater than 50% respiratory variability, suggesting right atrial pressure of 3 mmHg.  IAS/Shunts: No atrial level shunt detected by color flow Doppler.   LEFT VENTRICLE PLAX 2D LVIDd:         4.60 cm   Diastology LVIDs:         4.00 cm   LV e' medial:    5.11 cm/s LV PW:         0.90 cm   LV E/e' medial:  6.6 LV IVS:        0.90 cm   LV e' lateral:   9.03 cm/s LVOT diam:     2.40 cm   LV E/e' lateral: 3.7 LV SV:         43 LV SV Index:   24 LVOT Area:     4.52 cm   RIGHT VENTRICLE            IVC RV Basal diam:  3.40 cm    IVC diam: 0.80 cm RV S prime:     8.23 cm/s TAPSE (M-mode): 2.2 cm  LEFT ATRIUM             Index        RIGHT ATRIUM            Index LA diam:        4.70 cm 2.66 cm/m   RA Area:     16.00 cm LA Vol (A2C):   61.2 ml 34.68 ml/m  RA Volume:   43.90 ml  24.88 ml/m LA Vol (A4C):   50.6 ml 28.68 ml/m LA Biplane Vol: 56.8 ml 32.19 ml/m AORTIC VALVE LVOT Vmax:   53.45 cm/s LVOT Vmean:  35.350 cm/s LVOT VTI:    0.094 m AI PHT:  614 msec  AORTA Ao Root diam: 3.90 cm Ao Asc diam:  4.10 cm  MITRAL VALVE               TRICUSPID VALVE MV Area (PHT): 3.12 cm    TR Peak grad:   19.9 mmHg MV Decel Time: 243 msec    TR Vmax:        223.00 cm/s MV E velocity: 33.50 cm/s MV A velocity: 63.90 cm/s  SHUNTS MV E/A ratio:  0.52        Systemic VTI:  0.09 m Systemic Diam: 2.40 cm  Redell Shallow MD Electronically signed by Redell Shallow MD Signature Date/Time: 02/19/2023/2:37:59 PM    Final          ______________________________________________________________________________________________     Recent Labs: 05/06/2023: Hemoglobin 14.1; Platelets 406.0 05/07/2023: TSH 1.36 12/10/2023: ALT 35 12/31/2023: BUN 27; Creatinine, Ser 1.48; Magnesium  2.0; Potassium 4.9; Sodium 136  Recent Lipid Panel    Component Value Date/Time   CHOL 148 12/10/2023 1015   TRIG 119 12/10/2023 1015   HDL 50 12/10/2023 1015   CHOLHDL 3.0 12/10/2023 1015   CHOLHDL 5 07/30/2023 1032   VLDL 40.2 (H) 07/30/2023 1032   LDLCALC 77 12/10/2023 1015    History of Present Illness    83 year old male with the above past medical history including CAD s/p CABG x 4 in 1993 (LIMA-LAD, SVG-diagonal, SVG-OM, SVG-PDA), DES-RCA in 2002, PE/DVT, paroxysmal atrial fibrillation, chronic systolic heart failure, mitral valve regurgitation, aortic valve regurgitation, dilation of the ascending aorta, hypertension, hyperlipidemia, anemia, and GERD.   He has a history of extensive CAD as above.  He was hospitalized in June 2011 in the setting of NSTEMI which was felt to be due to RCA graft occlusion which supplied the PDA and PLA vessels.  The PDA was  extensively collateralized via the left circumflex territory, native RCA was totally occluded at the mid level, LIMA graft was widely patent as well as the SVG to diagonal and OM.  Medical management was advised. He was hospitalized in September 2024 in the setting of sepsis due to pneumonia, PE, DVT, acute systolic heart failure, and atrial fibrillation with RVR. He was loaded with amiodarone  and converted to sinus rhythm.  He was started on Eliquis . Echocardiogram in 11/2022 during hospitalization showed EF 35 to 40%, moderately decreased LV function, LV global hypokinesis, moderately reduced RV, moderately elevated PASP, moderate to severe mitral valve regurgitation, mild to moderate aortic valve regurgitation, mild dilation of the ascending aorta measuring 40 mm.  Further escalation of GDMT was limited in the setting of hypotension.  He was also noted to have acute pulmonary emboli, right lower extremity DVT as well as acute metabolic encephalopathy, delirium, EEG was nonacute.  CT of the head without acute finding.  Lexapro  and amiodarone  were discontinued in the setting of prolonged QTc. He was seen in the ED on 12/29/2022 in the setting of lower abdominal pain.  He was treated for a UTI.  Repeat echocardiogram in 01/2023 showed EF 25 to 30%, decreased LV function, LV global hypokinesis, G1 DD, normal RV systolic function, mildly dilated left atrium, mild mitral valve regurgitation, moderate aortic valve regurgitation, borderline dilation of the aortic root measuring 39 mm, mild dilation ascending aorta measuring 41 mm.  He was last seen in the office on 09/08/2023 and was stable from a cardiac standpoint.  Crestor  was increased to 20 mg daily.  He was started on Zetia .  Repeat echocardiogram was recommended in 3 to  4 months.   He presents today for follow-up accompanied by his caregiver.  Since his last visit he has done well from a cardiac standpoint.  He has increased his activity over the past year, he  frequently takes walks with his caregiver down his street.  He denies symptoms concerning for angina, he reports rare fleeting palpitations, denies any dizziness, presyncope or syncope.  Denies any edema, PND, orthopnea, weight gain.  Overall, he reports feeling well.  Home Medications    Current Outpatient Medications  Medication Sig Dispense Refill   acetaminophen  (TYLENOL ) 325 MG tablet Take 650 mg by mouth every 6 (six) hours as needed.     apixaban  (ELIQUIS ) 5 MG TABS tablet Take 1 tablet (5 mg total) by mouth 2 (two) times daily. 60 tablet 5   cyanocobalamin  (VITAMIN B12) 1000 MCG tablet Take 1 tablet (1,000 mcg total) by mouth daily. 90 tablet 1   docusate sodium  (COLACE) 50 MG capsule Take 50 mg by mouth daily.     ezetimibe  (ZETIA ) 10 MG tablet Take 1 tablet (10 mg total) by mouth daily. 90 tablet 3   folic acid  (FOLVITE ) 1 MG tablet Take 1 tablet (1 mg total) by mouth daily.     magnesium  chloride (SLOW-MAG) 64 MG TBEC SR tablet Take 1 tablet (64 mg total) by mouth daily. 30 tablet 1   memantine  (NAMENDA ) 5 MG tablet Take 1 tablet (5 mg at night) for 2 weeks, then increase to 1 tablet (5 mg) twice a day 60 tablet 11   mirtazapine  (REMERON  SOL-TAB) 15 MG disintegrating tablet Take 1 tablet (15 mg total) by mouth at bedtime. 30 tablet 0   nitroGLYCERIN  (NITROSTAT ) 0.4 MG SL tablet Place 1 tablet (0.4 mg total) under the tongue every 5 (five) minutes as needed for chest pain. If med is needed, be seen by medical provider immediately. 50 tablet 1   oxybutynin  (DITROPAN ) 5 MG tablet Take 1 tablet (5 mg total) by mouth 2 (two) times daily. 180 tablet 1   pantoprazole  (PROTONIX ) 40 MG tablet Take 1 tablet by mouth once daily 90 tablet 0   potassium chloride  SA (KLOR-CON  M) 20 MEQ tablet Take 1 tablet (20 mEq total) by mouth daily. 90 tablet 1   rosuvastatin  (CRESTOR ) 20 MG tablet Take 1 tablet (20 mg total) by mouth daily. 90 tablet 3   torsemide  (DEMADEX ) 20 MG tablet Take 0.5-1 tablets  (10-20 mg total) by mouth daily. 30 tablet 0   No current facility-administered medications for this visit.     Review of Systems    He denies chest pain, dyspnea, pnd, orthopnea, n, v, dizziness, syncope, edema, weight gain, or early satiety. All other systems reviewed and are otherwise negative except as noted above.   Physical Exam    VS:  BP 98/66   Pulse 91   Ht 5' 7 (1.702 m)   Wt 157 lb 9.6 oz (71.5 kg)   SpO2 98%   BMI 24.68 kg/m   GEN: Well nourished, well developed, in no acute distress. HEENT: normal. Neck: Supple, no JVD, carotid bruits, or masses. Cardiac: RRR, no murmurs, rubs, or gallops. No clubbing, cyanosis, edema.  Radials/DP/PT 2+ and equal bilaterally.  Respiratory:  Respirations regular and unlabored, clear to auscultation bilaterally. GI: Soft, nontender, nondistended, BS + x 4. MS: no deformity or atrophy. Skin: warm and dry, no rash. Neuro:  Strength and sensation are intact. Psych: Normal affect.  Accessory Clinical Findings    ECG personally reviewed by  me today - EKG Interpretation Date/Time:  Thursday January 13 2024 09:39:03 EDT Ventricular Rate:  91 PR Interval:  156 QRS Duration:  104 QT Interval:  378 QTC Calculation: 464 R Axis:   -38  Text Interpretation: Normal sinus rhythm Left axis deviation Inferior infarct (cited on or before 08-Sep-2023) When compared with ECG of 08-Sep-2023 11:42, Abberant conduction is no longer Present Confirmed by Daneen Perkins ([id number]) on 01/13/2024 9:41:08 AM  - no acute changes.   Lab Results  Component Value Date   WBC 10.4 05/06/2023   HGB 14.1 05/06/2023   HCT 42.6 05/06/2023   MCV 93.7 05/06/2023   PLT 406.0 (H) 05/06/2023   Lab Results  Component Value Date   CREATININE 1.48 12/31/2023   BUN 27 (H) 12/31/2023   NA 136 12/31/2023   K 4.9 12/31/2023   CL 99 12/31/2023   CO2 29 12/31/2023   Lab Results  Component Value Date   ALT 35 12/10/2023   AST 41 (H) 12/10/2023   ALKPHOS 98  12/10/2023   BILITOT 0.6 12/10/2023   Lab Results  Component Value Date   CHOL 148 12/10/2023   HDL 50 12/10/2023   LDLCALC 77 12/10/2023   TRIG 119 12/10/2023   CHOLHDL 3.0 12/10/2023    Lab Results  Component Value Date   HGBA1C 6.2 03/03/2021    Assessment & Plan    1. CAD: S/p CABG x 4 in 1993 (LIMA-LAD, SVG-diagonal, SVG-OM, SVG-PDA), DES-RCA in 2002. S/p NSTEMI in 2011 which was felt to be due to RCA graft occlusion which supplied the PDA and PLA vessels.  The PDA was extensively collateralized via the left circumflex territory, native RCA was totally occluded at the mid level, LIMA graft was widely patent as well as the SVG to diagonal and OM. Medical management was advised. Repeat echocardiogram in 01/2023 showed EF 25 to 30%, decreased LV function, LV global hypokinesis, G1 DD, normal RV systolic function, mildly dilated left atrium, mild mitral valve regurgitation, moderate aortic valve regurgitation, borderline dilation of the aortic root measuring 39 mm, mild dilation ascending aorta measuring 41 mm. Stable with no anginal symptoms. Repeat echo pending as below. No ASA in the setting of chronic DOAC therapy.  Continue Crestor , Zetia .   2. HFrEF: Most recent echo showed EF 25 to 30% as above. Euvolemic and well compensated on exam. Escalation of GDMT has been limited in the setting of significant hypotension. Continue torsemide .   3. Paroxysmal atrial fibrillation: Diagnosed during hospitalization in 11/2022 in the setting of sepsis/pneumonia.  Previously on amiodarone , however, this was discontinued in the setting of QT prolongation.  Maintaining sinus rhythm on exam.  He notes rare fleeting palpitations, denies any associated symptoms.  Denies bleeding.  Recent BMET stable, will update CBC as it has been greater than 6 months.  Reviewed ED precautions. Continue Eliquis .   4. History of PE/DVT: On Eliquis .    5.  Valvular heart disease: Repeat echocardiogram in 01/2023 showed EF  25 to 30%, decreased LV function, LV global hypokinesis, G1 DD, normal RV systolic function, mildly dilated left atrium, mild mitral valve regurgitation, moderate aortic valve regurgitation, borderline dilation of the aortic root measuring 39 mm, mild dilation ascending aorta measuring 41 mm.  Asymptomatic.  Will repeat echo as above.  6.  Mild dilation of ascending aorta: Will repeat echo as above.  7. Hypertension/hypotension: He continues to note borderline hypotension, asymptomatic.  Stable on daily torsemide .   8. Hyperlipidemia: LDL was 77 in 11/2023,  above goal despite maximally tolerated statin therapy.  Will refer to lipid clinic Pharm.D. for consideration of PCSK9 inhibitor per Dr. Katharina recommendation.  Continue Crestor , Zetia .  9. Disposition: Former Dr. Burnard patient.  Follow-up in 6 months, he will establish with Dr. Anner.      Damien JAYSON Braver, NP 01/13/2024, 10:34 AM

## 2024-01-13 NOTE — Patient Instructions (Signed)
 Medication Instructions:  Your physician recommends that you continue on your current medications as directed. Please refer to the Current Medication list given to you today.  *If you need a refill on your cardiac medications before your next appointment, please call your pharmacy*  Lab Work: CBC today Testing/Procedures: Your physician has requested that you have an echocardiogram. Echocardiography is a painless test that uses sound waves to create images of your heart. It provides your doctor with information about the size and shape of your heart and how well your heart's chambers and valves are working. This procedure takes approximately one hour. There are no restrictions for this procedure. Please do NOT wear cologne, perfume, aftershave, or lotions (deodorant is allowed). Please arrive 15 minutes prior to your appointment time.  Please note: We ask at that you not bring children with you during ultrasound (echo/ vascular) testing. Due to room size and safety concerns, children are not allowed in the ultrasound rooms during exams. Our front office staff cannot provide observation of children in our lobby area while testing is being conducted. An adult accompanying a patient to their appointment will only be allowed in the ultrasound room at the discretion of the ultrasound technician under special circumstances. We apologize for any inconvenience.   Follow-Up: At [facility], you and your health needs are our priority.  As part of our continuing mission to provide you with exceptional heart care, our providers are all part of one team.  This team includes your primary Cardiologist (physician) and Advanced Practice Providers or APPs (Physician Assistants and Nurse Practitioners) who all work together to provide you with the care you need, when you need it.  Your next appointment:   6 month(s)  Provider:   Dr.Harding     We recommend signing up for the patient portal called  MyChart.  Sign up information is provided on this After Visit Summary.  MyChart is used to connect with patients for Virtual Visits (Telemedicine).  Patients are able to view lab/test results, encounter notes, upcoming appointments, etc.  Non-urgent messages can be sent to your provider as well.   To learn more about what you can do with MyChart, go to [email or web address].

## 2024-01-13 NOTE — Progress Notes (Signed)
 Lab results have been discussed.   Verbalized understanding? Yes  Are there any questions? No

## 2024-01-13 NOTE — Progress Notes (Signed)
 LVM to call office or via MyChart

## 2024-01-13 NOTE — Progress Notes (Signed)
 LVM to call office.

## 2024-01-14 ENCOUNTER — Ambulatory Visit: Payer: Self-pay | Admitting: Nurse Practitioner

## 2024-01-14 LAB — CBC
Hematocrit: 47.6 % (ref 37.5–51.0)
Hemoglobin: 15 g/dL (ref 13.0–17.7)
MCH: 29.5 pg (ref 26.6–33.0)
MCHC: 31.5 g/dL (ref 31.5–35.7)
MCV: 94 fL (ref 79–97)
Platelets: 224 x10E3/uL (ref 150–450)
RBC: 5.09 x10E6/uL (ref 4.14–5.80)
RDW: 13 % (ref 11.6–15.4)
WBC: 8.2 x10E3/uL (ref 3.4–10.8)

## 2024-01-18 ENCOUNTER — Ambulatory Visit: Admitting: Physician Assistant

## 2024-01-27 ENCOUNTER — Institutional Professional Consult (permissible substitution): Payer: PPO | Admitting: Psychology

## 2024-01-27 ENCOUNTER — Ambulatory Visit: Payer: Self-pay

## 2024-01-28 ENCOUNTER — Ambulatory Visit: Admitting: Physician Assistant

## 2024-01-30 ENCOUNTER — Other Ambulatory Visit: Payer: Self-pay | Admitting: Family Medicine

## 2024-01-31 ENCOUNTER — Encounter: Payer: Self-pay | Admitting: Radiology

## 2024-02-04 ENCOUNTER — Encounter: Payer: PPO | Admitting: Psychology

## 2024-02-11 ENCOUNTER — Ambulatory Visit

## 2024-02-21 ENCOUNTER — Other Ambulatory Visit: Payer: Self-pay | Admitting: Family Medicine

## 2024-02-21 ENCOUNTER — Other Ambulatory Visit: Payer: Self-pay | Admitting: Family

## 2024-02-21 DIAGNOSIS — F4320 Adjustment disorder, unspecified: Secondary | ICD-10-CM

## 2024-02-27 ENCOUNTER — Other Ambulatory Visit: Payer: Self-pay | Admitting: Family Medicine

## 2024-02-29 ENCOUNTER — Telehealth: Payer: Self-pay | Admitting: Family Medicine

## 2024-02-29 ENCOUNTER — Other Ambulatory Visit: Payer: Self-pay

## 2024-02-29 NOTE — Telephone Encounter (Signed)
 Medication was sent to another provider. I have refilled medication and sent to patients pharmacy

## 2024-02-29 NOTE — Telephone Encounter (Signed)
 Copied from CRM [id number]. Topic: Clinical - Prescription Issue >> Feb 29, 2024 12:16 PM Macario HERO wrote: Reason for CRM: Patient caregiver Suzen called to check on patient medication refill status for torsemide  (DEMADEX ) 20 MG tablet [[id number]]

## 2024-03-02 ENCOUNTER — Ambulatory Visit (HOSPITAL_COMMUNITY)
Admission: RE | Admit: 2024-03-02 | Discharge: 2024-03-02 | Disposition: A | Source: Ambulatory Visit | Attending: Nurse Practitioner | Admitting: Nurse Practitioner

## 2024-03-02 DIAGNOSIS — I5022 Chronic systolic (congestive) heart failure: Secondary | ICD-10-CM | POA: Diagnosis not present

## 2024-03-02 LAB — ECHOCARDIOGRAM COMPLETE
P 1/2 time: 560 ms
S' Lateral: 4 cm

## 2024-03-05 ENCOUNTER — Other Ambulatory Visit: Payer: Self-pay | Admitting: Family Medicine

## 2024-03-09 ENCOUNTER — Encounter: Payer: Self-pay | Admitting: Physician Assistant

## 2024-03-09 ENCOUNTER — Ambulatory Visit: Admitting: Physician Assistant

## 2024-03-09 NOTE — Progress Notes (Incomplete)
 Memory Impairment, concern for vascular etiology with mood disturbance   Bradley Oconnor is a very pleasant 83 y.o. 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 Patient is currently on***.  This patient is accompanied in the office by *** who supplements the history.  Previous records as well as any outside records available were reviewed prior to todays visit. Patient was last seen on 05/07/2023, then losing to follow-up.***. Memory is **. MMSE today is  /30. Patient is able to participate on ADLs and continues to drive without difficulties. 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  History of Present Illness      Any changes in memory since last visit?  repeats oneself?  Endorsed Disoriented when walking into a room? Denies ***  Leaving objects?  May misplace things but not in unusual places***  Wandering behavior?  denies   Any personality changes since last visit?  Denies.   Any worsening depression?:  Denies.   Hallucinations or paranoia?  Denies.   Seizures? denies    Any sleep changes?  Denies vivid dreams, REM behavior or sleepwalking   Sleep apnea?   Denies.   Any hygiene concerns? Denies.  Independent of bathing and dressing?  Endorsed  Does the patient needs help with medications?   is in charge *** Who is in charge of the finances?   is in charge    *** Any changes in appetite?  denies ***   Patient have trouble swallowing? Denies.   Does the patient cook? No Any headaches?   denies   Any vision changes?*** Chronic back pain  denies   Ambulates with difficulty? Denies.  *** Recent falls or head injuries? Denies.     Unilateral weakness, numbness or tingling? denies   Any tremors?  Denies  *** Any anosmia?  Denies   Any incontinence of urine?  Endorsed***  Any bowel dysfunction?   Denies      Patient lives   *** Does the patient drive? No longer drives ***     How long did patient have memory difficulties?  For about 6 months .  He states that the death of his wife affected him deeply.  Patient reports some difficulty remembering new information, recent conversations, names.LTM is excellent according to his brother repeats oneself? Denies  Disoriented when walking into a room?  Patient denies    Leaving objects in unusual places?  Denies    Wandering behavior? denies   Any personality changes, or depression, anxiety?  He has anxiety, upset at times, agitated intermittently, family reports depression.  Patient agrees.  He Lost his wife in 09/2022, and for a while he was unable to begin the grieving process, I am just beginning to do so .  In addition, the patient reports survivor's guilt from battle in [place] lost 46 of his men, he is very tearful when describing it.  He never sought psychotherapy I had to move on and go to work ,  and now is beginning to catch up of me . Hallucinations or paranoia? Denies.   Seizures? denies    Any sleep changes?  Sleeps well. Denies vivid dreams, REM behavior or sleepwalking.  He is taking mirtazapine  Sleep apnea? Denies.   Any hygiene concerns?  Denies.   Independent of bathing and dressing?  He needs assistance with bathing and dressing due to decreased mobility. Does the patient need help with medications?  Caregiver is in charge   Who is in charge of the finances?  Patient, brother  is in charge     Any changes in appetite?  Decreased, has recently been on mirtazapine  having gained weight from 121 to 141 pounds but then he got COVID in January of this year, has dropped a few pounds from 141-136.  He is gaining slowly his weight  Patient have trouble swallowing?  Denies.   Does the patient cook? No  Any headaches?  Denies.   Chronic pain? Denies.   Ambulates with difficulty? Uses a walker an cane, does PT and OT.   Recent falls or head injuries? Denies.     Vision changes?  Denies any new issues.  Any strokelike symptoms?  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  He denies any other symptoms or signs. Any tremors? Denies. . Any anosmia? Denies.   Any incontinence of urine?  Endorsed, takes Ditropan . Any bowel dysfunction?  History of intermittent constipation. Patient lives with Suzen his caregiver    History of heavy alcohol intake? Denies.   History of heavy tobacco use? Denies.   Family history of dementia?  Mother with dementia?type Does patient drive? 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  Atrophy noted.  No acute findings        No data to display            05/07/2023   11:00 AM 03/26/2023   12:08 PM  Montreal Cognitive Assessment   Visuospatial/ Executive (0/5) 2 1  Naming (0/3) 3 2  Attention: Read list of digits (0/2) 1 0  Attention: Read list of letters (0/1) 1 1  Attention: Serial 7 subtraction starting at 100 (0/3) 3 3  Language: Repeat phrase  (0/2) 2 1  Language : Fluency (0/1) 0 0  Abstraction (0/2) 1 2  Delayed Recall (0/5) 0 0  Orientation (0/6) 4 6  Total 17 16  Adjusted Score (based on education) 17 17      Objective:    Neurological Exam:    VITALS:  There were no vitals filed for this visit.  GEN:  The patient appears stated age and is in NAD. HEENT:  Normocephalic, atraumatic.   Neurological examination:  General: NAD, well-groomed, appears stated age. Orientation: The patient is alert. Oriented to person, place and date Cranial nerves: There is good facial symmetry.The speech is fluent and clear. No aphasia or dysarthria. Fund of knowledge is appropriate. Recent memory impaired, remote memory normal. Attention and concentration are reduced. Able to name objects and repeat phrases.  Hearing is intact to conversational tone. *** Sensation: Sensation is intact to light touch throughout Motor: Strength is at least antigravity x4. DTR's 2/4 in UE/LE     Movement examination:  Tone: There is normal tone in the UE/LE Abnormal movements:  no tremor.  No myoclonus.  No asterixis.   Coordination:  There is no decremation with RAM's. Normal finger to nose  Gait and Station: The patient has no*** difficulty arising out of a deep-seated chair without the use of the hands. The patient's stride length is good.  Gait is cautious and narrow.    Thank you for allowing us  the opportunity to participate in the care of this nice patient. Please do not hesitate to contact us  for any questions or concerns.   Total time spent on today's visit was *** minutes dedicated to this patient today, preparing to see patient, examining the patient, ordering tests and/or medications and counseling the patient, documenting clinical information in the EHR or other health record, independently interpreting results and communicating results to the patient/family, discussing treatment and goals, answering patient's questions and coordinating  care.  Cc:  Levora Reyes SAUNDERS, MD  Camie Sevin 03/09/2024 5:26 AM

## 2024-03-20 ENCOUNTER — Telehealth: Payer: Self-pay | Admitting: Pharmacy Technician

## 2024-03-20 ENCOUNTER — Other Ambulatory Visit (HOSPITAL_COMMUNITY): Payer: Self-pay

## 2024-03-20 ENCOUNTER — Telehealth: Payer: Self-pay

## 2024-03-20 ENCOUNTER — Ambulatory Visit: Attending: Cardiology

## 2024-03-20 DIAGNOSIS — E785 Hyperlipidemia, unspecified: Secondary | ICD-10-CM

## 2024-03-20 NOTE — Telephone Encounter (Signed)
 Pharmacy Patient Advocate Encounter   Received notification from Physician's Office that prior authorization for repatha  is required/requested.   Insurance verification completed.   The patient is insured through [facility] [employer]/RX ADVANCE.   Per test claim: PA required; PA submitted to above mentioned insurance via Latent Key/confirmation #/EOC [id number] Status is pending

## 2024-03-20 NOTE — Telephone Encounter (Signed)
 Please see other encounter.

## 2024-03-20 NOTE — Patient Instructions (Signed)
 Your Results:             Your most recent labs Goal  Total Cholesterol 148 < 200  Triglycerides 119 < 150  HDL (happy/good cholesterol) 50 > 40  LDL (lousy/bad cholesterol 77 < 70   Medication changes: Continue rosuvastatin  20 mg daily, ezetimibe  10 mg daily We will start the process to get Praluent or Repatha  covered by your insurance.  Once the prior authorization is complete, we will call you to let you know and confirm pharmacy information.   You will take   Lab orders:We want to repeat labs 3 months after starting PCSK9i.  We will send you a lab order to       remind you once we get closer to that time.    Maurie Musco E. Sukaina Toothaker, Pharm.D, CPP [place] [facility]. [facility] [address], [place], [place] [address] Phone: [phone]; Fax: [phone]     Praluent is a cholesterol medication that improved your body's ability to get rid of bad cholesterol known as LDL. It can lower your LDL up to 60%. It is an injection that is given under the skin every 2 weeks. The most common side effects of Praluent include runny nose, symptoms of the common cold, rarely flu or flu-like symptoms, back/muscle pain in about 3-4% of the patients, and redness, pain, or bruising at the injection site.    Repatha  is a cholesterol medication that improved your body's ability to get rid of bad cholesterol known as LDL. It can lower your LDL up to 60%! It is an injection that is given under the skin every 2 weeks. The medication often requires a prior authorization from your insurance company. We will take care of submitting all the necessary information to your insurance company to get it approved. The most common side effects of Repatha  include runny nose, symptoms of the common cold, rarely flu or flu-like symptoms, back/muscle pain in about 3-4% of the patients, and redness, pain, or bruising at the injection site.    It is also recommended that patients with high  cholesterol adhere to a heart healthy diet, get regular exercise, avoid use of tobacco products, and maintain a healthy weight. Steps that you can take to help in these areas:  Limit consumption of trans fats, saturated fats, and cholesterol in your diet  Increase intake of lean meats such as chicken, turkey, and fish  Increase intake of foods rich in fiber such as fresh fruits, vegetables, beans and oatmeal Exercise as you are able; even 30 minutes of walking daily can aid in increasing heart health      Copay Assistance:  The [employer] foundation offers assistance to help pay for medication copays.  They will cover copays for all cholesterol lowering meds, including statins, fibrates, omega-3 oils, ezetimibe , Repatha , Praluent, Nexletol, Nexlizet.  The cards are usually good for $2,500 or 12 months, whichever comes first. Go to healthwellfoundation.org Click on Apply Now Answer questions as to whom is applying (patient or representative) Your disease fund will be hypercholesterolemia - Medicare access Select the cholesterol medication you need assistance with (Repatha , Praluent, Nexlizet...) They will ask question about qualifying diagnosis - you can mark yes; and do you have insurance coverage.   When they ask what type of assistance you are interested in - copay assistance When you submit, the approval is usually within minutes.  You will need to print the card information from the site  You will need to show this information to your pharmacy, they will bill your Medicare Part D plan first -then bill [employer] Well --for the copay.   You can also call them at [phone], although the hold times can be quite long.

## 2024-03-20 NOTE — Progress Notes (Signed)
 Patient ID: Bradley Oconnor                 DOB: 12-14-1940                    MRN: [id number]      HPI: Bradley Oconnor is a 83 y.o. male patient referred to lipid clinic by Bradley Braver, PA. PMH is significant for CAD,s/p CABG LIMA-LAD, SVG-diagonal, SVG-OM, SVG-PDA (1993), DES-RCA in 2002, PE/DVT, HTN, pAF, chronic systolic heart failure, mitral and aortic valve regurgitation, HLD and GERD.  The patient was last evaluated by cardiology in October 2025 for CAD follow-up, at which time it was noted LDL remained above goal (LDL 77 in September 2025; LDL goal < 70) despite maximally tolerated statin therapy. He was referred to the PharmD for consideration of PCSK9 inhibitor therapy.   Today, the patient presents in good spirits and is accompanied by his caretaker. He currently takes rosuvastatin  20 mg daily and ezetimibe  10 mg daily for hyperlipidemia management and reports tolerating both medications well without any side effects. [name] does all the cooking for patient and prepares well balanced meals.   Reviewed options for lowering LDL cholesterol, including, PCSK-9 inhibitors.  Discussed mechanisms of action, dosing, side effects and potential decreases in LDL cholesterol.  Also reviewed cost information and potential options for patient assistance.   Current Medications: rosuvastatin  20 mg daily, ezetimibe  10 mg daily  Intolerances: none Risk Factors: age, CAD,s/p CABG LIMA-LAD, SVG-diagonal, SVG-OM, SVG-PDA (1993), DES-RCA in 2002, PE/DVT, HTN, pAF, chronic systolic heart failure, mitral and aortic valve regurgitation,  LDL goal: < 70  Lipid panel (11/2023): Chol 148, Trig 119, HDL 50, LDL 77 Lpa (11/2023): 200.4 Liver enzymes (11/2023): ALT 35, AST 41, Alk phos 98  Diet:  Lunch and dinner include ribs and potatoes on occasion; Patient enjoys salads, squash, broccoli, carrots, baked potatoes, and baked chicken. Snacks often consist of carrots and celery sticks served with ranch  dressing. The primary beverage reported is [name] and water .  Exercise:  Patient walks up his driveway a couple times per week, drive way is 799 feet   Family History:  elation Problem Comments  Mother (Deceased) Cancer breast, stomach  Heart disease     Father (Deceased) Heart disease   Hyperlipidemia     Brother (Deceased at age 80) Diabetes   Heart disease   Hyperlipidemia     Brother Metallurgist) Healthy     Maternal Grandmother (Deceased)   Maternal Grandfather (Deceased)   Paternal Grandmother (Deceased)   Paternal Grandfather (Deceased at age 66) Heart disease       Social History:  Alcohol: none Smoking: none   Labs:  Lipid Panel     Component Value Date/Time   CHOL 148 12/10/2023 1015   TRIG 119 12/10/2023 1015   HDL 50 12/10/2023 1015   CHOLHDL 3.0 12/10/2023 1015   CHOLHDL 5 07/30/2023 1032   VLDL 40.2 (H) 07/30/2023 1032   LDLCALC 77 12/10/2023 1015   LABVLDL 21 12/10/2023 1015    Past Medical History:  Diagnosis Date   Blood transfusion without reported diagnosis    CAD (coronary artery disease)    GERD (gastroesophageal reflux disease)    Heart attack (HCC)    Hyperlipidemia    Hypertension 03/07/2012   ECHO-WNL     08/12/11 Lexiscan MyoviewNo significant ischemia demonstrated Low risk scan There is a moderate sized dense scar in the LCX territoy unchanged from the prior study.. Post-  stress EF is 40%.   Septic shock (HCC) 12/10/2022   Thyroid  disease     Medications Ordered Prior to Encounter[1]  Allergies[2]  Assessment/Plan:  1. Hyperlipidemia -  Problem  Hyperlipidemia   Hyperlipidemia Assessment:  LDL goal: < 70  mg/dl; last LDLc 77 mg/dl (0/7974); liver enzymes wnl Elevated Lpa 200.4 in 06/2023 Tolerates rosuvastatin  and ezetimibe  well without any side effects  Discussed next potential options (PCSK-9 inhibitors); cost, dosing efficacy, side effects  Discussed heart healthy dietary options and encouraged physical  activity Patient willing to proceed with PCSK9i therapy  Plan: Continue taking current medications rosuvastatin  20 mg daily and ezetimibe  10 mg daily Will apply for PA for PCSK9i; will inform patient upon approval  Lipid lab and Lpa due in 3 months after starting PCSK9i     Thank you,  Bryceton Hantz E. Mandie Crabbe, Pharm.D, CPP  [facility]. [facility] [address], [place], [place] [address] Phone: [phone]; Fax: [phone]        [1]  Current Outpatient Medications on File Prior to Visit  Medication Sig Dispense Refill   acetaminophen  (TYLENOL ) 325 MG tablet Take 650 mg by mouth every 6 (six) hours as needed.     cyanocobalamin  (VITAMIN B12) 1000 MCG tablet Take 1 tablet (1,000 mcg total) by mouth daily. 90 tablet 1   docusate sodium  (COLACE) 50 MG capsule Take 50 mg by mouth daily.     ELIQUIS  5 MG TABS tablet Take 1 tablet by mouth twice daily 60 tablet 0   ezetimibe  (ZETIA ) 10 MG tablet Take 1 tablet (10 mg total) by mouth daily. 90 tablet 3   folic acid  (FOLVITE ) 1 MG tablet Take 1 tablet (1 mg total) by mouth daily.     magnesium  chloride (SLOW-MAG) 64 MG TBEC SR tablet Take 1 tablet (64 mg total) by mouth daily. 30 tablet 1   memantine  (NAMENDA ) 5 MG tablet Take 1 tablet (5 mg at night) for 2 weeks, then increase to 1 tablet (5 mg) twice a day 60 tablet 11   mirtazapine  (REMERON  SOL-TAB) 15 MG disintegrating tablet DISSOLVE 1 TABLET BY MOUTH AT BEDTIME 30 tablet 0   nitroGLYCERIN  (NITROSTAT ) 0.4 MG SL tablet Place 1 tablet (0.4 mg total) under the tongue every 5 (five) minutes as needed for chest pain. If med is needed, be seen by medical provider immediately. 50 tablet 1   oxybutynin  (DITROPAN ) 5 MG tablet Take 1 tablet (5 mg total) by mouth 2 (two) times daily. 180 tablet 1   pantoprazole  (PROTONIX ) 40 MG tablet Take 1 tablet by mouth once daily 90 tablet 0   potassium chloride  SA (KLOR-CON  M) 20 MEQ tablet Take 1 tablet by mouth  once daily 90 tablet 0   rosuvastatin  (CRESTOR ) 20 MG tablet Take 1 tablet (20 mg total) by mouth daily. 90 tablet 3   torsemide  (DEMADEX ) 20 MG tablet TAKE 1/2 TO 1 (ONE-HALF TO ONE) TABLET BY MOUTH ONCE DAILY 30 tablet 0   No current facility-administered medications on file prior to visit.  [2]  Allergies Allergen Reactions   Procardia [Nifedipine] Other (See Comments)    Hypotension    Phenergan [Promethazine Hcl] Nausea And Vomiting

## 2024-03-20 NOTE — Assessment & Plan Note (Signed)
 Assessment:  LDL goal: < 70  mg/dl; last LDLc 77 mg/dl (0/7974); liver enzymes wnl Elevated Lpa 200.4 in 06/2023 Tolerates rosuvastatin  and ezetimibe  well without any side effects  Discussed next potential options (PCSK-9 inhibitors); cost, dosing efficacy, side effects  Discussed heart healthy dietary options and encouraged physical activity Patient willing to proceed with PCSK9i therapy  Plan: Continue taking current medications rosuvastatin  20 mg daily and ezetimibe  10 mg daily Will apply for PA for PCSK9i; will inform patient upon approval  Lipid lab and Lpa due in 3 months after starting PCSK9i

## 2024-03-21 ENCOUNTER — Other Ambulatory Visit (HOSPITAL_COMMUNITY): Payer: Self-pay

## 2024-03-21 MED ORDER — REPATHA SURECLICK 140 MG/ML ~~LOC~~ SOAJ: 140.0000 mg | SUBCUTANEOUS | 2 refills | Status: AC

## 2024-03-21 NOTE — Telephone Encounter (Signed)
 Called patient to discuss Repatha  copay of $0/month. NR-LVM requesting call back. Rx already sent to preferred pharmacy as requested at visit.

## 2024-03-21 NOTE — Telephone Encounter (Signed)
 Pharmacy Patient Advocate Encounter  Received notification from [employer] that Prior Authorization for Repatha  has been APPROVED from 03/20/24 to 09/16/24. Ran test claim, Copay is $0.00- one month. This test claim was processed through [facility]- copay amounts may vary at other pharmacies due to pharmacy/plan contracts, or as the patient moves through the different stages of their insurance plan.   PA #/Case ID/Reference #: [id number]

## 2024-03-21 NOTE — Addendum Note (Signed)
 Addended by: Salim Forero E on: 03/21/2024 02:43 PM   Modules accepted: Orders

## 2024-03-26 ENCOUNTER — Other Ambulatory Visit: Payer: Self-pay | Admitting: Family Medicine

## 2024-03-26 DIAGNOSIS — F4320 Adjustment disorder, unspecified: Secondary | ICD-10-CM

## 2024-03-31 NOTE — Telephone Encounter (Signed)
 PT caregiver is calling asking to speak with Lakedra about prior auth

## 2024-03-31 NOTE — Telephone Encounter (Signed)
 Called patient back to discuss Repatha  copay of $0/month. Patient has picked up medication and will give first injection soon. Encouraged patient to call me if he has any questions or concerns

## 2024-04-07 ENCOUNTER — Encounter: Payer: Self-pay | Admitting: Family Medicine

## 2024-04-07 ENCOUNTER — Ambulatory Visit: Admitting: Family Medicine

## 2024-04-07 VITALS — BP 116/56 | HR 77 | Temp 98.6°F | Resp 12 | Ht 67.0 in | Wt 158.8 lb

## 2024-04-07 DIAGNOSIS — R351 Nocturia: Secondary | ICD-10-CM

## 2024-04-07 DIAGNOSIS — R350 Frequency of micturition: Secondary | ICD-10-CM | POA: Diagnosis not present

## 2024-04-07 DIAGNOSIS — I25709 Atherosclerosis of coronary artery bypass graft(s), unspecified, with unspecified angina pectoris: Secondary | ICD-10-CM

## 2024-04-07 DIAGNOSIS — F4320 Adjustment disorder, unspecified: Secondary | ICD-10-CM | POA: Diagnosis not present

## 2024-04-07 DIAGNOSIS — R7989 Other specified abnormal findings of blood chemistry: Secondary | ICD-10-CM

## 2024-04-07 LAB — BASIC METABOLIC PANEL WITH GFR
BUN: 22 mg/dL (ref 6–23)
CO2: 29 meq/L (ref 19–32)
Calcium: 9.6 mg/dL (ref 8.4–10.5)
Chloride: 101 meq/L (ref 96–112)
Creatinine, Ser: 1.45 mg/dL (ref 0.40–1.50)
GFR: 44.2 mL/min — ABNORMAL LOW
Glucose, Bld: 110 mg/dL — ABNORMAL HIGH (ref 70–99)
Potassium: 4.4 meq/L (ref 3.5–5.1)
Sodium: 139 meq/L (ref 135–145)

## 2024-04-07 LAB — POCT URINALYSIS DIP (MANUAL ENTRY)
Bilirubin, UA: NEGATIVE
Glucose, UA: NEGATIVE mg/dL
Ketones, POC UA: NEGATIVE mg/dL
Leukocytes, UA: NEGATIVE
Nitrite, UA: NEGATIVE
Protein Ur, POC: NEGATIVE mg/dL
Spec Grav, UA: 1.025
Urobilinogen, UA: 0.2 U/dL
pH, UA: 5.5

## 2024-04-07 LAB — PSA: PSA: 0.49 ng/mL (ref 0.10–4.00)

## 2024-04-07 NOTE — Patient Instructions (Signed)
 Thank you for coming in today. No change in medications at this time.  I will refer you to urology to discuss the frequent urination and other treatment options.  Unfortunately I do think the oxybutynin  is contributing to the dry mouth.  I am checking some urine testing as well as prostate testing today but you should hear from the urology office soon.  Let me know if you have not heard from them in the next 2 weeks.  I am okay with the starting Repatha  under the recommendations from cardiology.  That can be given at home.  If any concerns regarding that injection, please reach out to cardiology or the pharmacist that met with you in December.  Glad to hear you are doing well.  If there are any concerns on your bloodwork, I will let you know. Take care!

## 2024-04-07 NOTE — Progress Notes (Unsigned)
 "  Subjective:  Patient ID: Bradley Oconnor, male    DOB: Oct 31, 1940  Age: 84 y.o. MRN: [id number]  CC:  Chief Complaint  Patient presents with   Follow-up    3 month follow up for chronic conditions. Wants to discuss repatha . Patient is still urinating more than he would like. Would like to know your thoughts. Using biotin for dry mouth    HPI Bradley Oconnor presents for   Concerns above.   Urinary frequency Longstanding symptoms were discussed in October, denied dysuria hematuria fever abdominal pain or back pain, and denied retention symptoms when initially discussed in August.  Had taken oxybutynin  previously with relief of symptoms.  We started initially at 5 mg oxybutynin  in the evening for very frequency, he had been taking twice per day at his October visit with improved control.  Less nocturia.  Still reports some frequent urination. Sometimes having to urinate every to 1 hour.  Taking oxybutynin  BID with side effects of dry mouth. No falls. Using biotene for dry mouth. No change in urination, no hematuria, fever, abd/back pain.   Lab Results  Component Value Date   PSA 1.33 04/17/2014  Creatinine 1.17, 1.37, 1.48 most recently on 12/31/23.   Hypomagnesemia Treated with magnesium  supplement, twice daily over-the-counter without diarrhea when discussed in October.  Levels were stable in October at 2.0, previously 2.1 in February.  Still taking twice per day supplementation.  Hyperlipidemia: Followed by cardiology, plan previously to start PCSK9 inhibitor to lower cholesterol and lipoprotein a.  Pharmacy visit with cardiology noted from December 22.  He was on rosuvastatin  20 mg daily, Zetia  10 mg daily and planned on starting process to get either Praluent or Repatha  covered by insurance.  Plan for labs 3 months after starting PCSK9 inhibitor. Repatha  was authorized and has at home - has not used yet, but plans to start.  Wanted to just check with me first.  They will give  injection at home. Lab Results  Component Value Date   CHOL 148 12/10/2023   HDL 50 12/10/2023   LDLCALC 77 12/10/2023   TRIG 119 12/10/2023   CHOLHDL 3.0 12/10/2023   Lab Results  Component Value Date   ALT 35 12/10/2023   AST 41 (H) 12/10/2023   ALKPHOS 98 12/10/2023   BILITOT 0.6 12/10/2023   Adjustment disorder Treated with mirtazapine , stable when discussed in August. Working well. Sleeping well,. Doing well with Suzen.      04/07/2024   10:30 AM 06/11/2023    9:31 AM 02/11/2023   11:20 AM 07/16/2022    4:17 PM 08/11/2021   11:41 AM  Depression screen PHQ 2/9  Decreased Interest 0 0 0 0 1  Down, Depressed, Hopeless 0 0 0 0 0  PHQ - 2 Score 0 0 0 0 1  Altered sleeping 0 0 0 0 0  Tired, decreased energy 0 0 0 0 1  Change in appetite 0 0 0 0 1  Feeling bad or failure about yourself  0 0 0 0 0  Trouble concentrating 0 0 0 0 0  Moving slowly or fidgety/restless 0 0 0 0 0  Suicidal thoughts 0 0 0 0 0  PHQ-9 Score 0 0  0  0  3   Difficult doing work/chores Not difficult at all   Not difficult at all      Data saved with a previous flowsheet row definition       History Patient Active Problem List  Diagnosis Date Noted   Stress incontinence (male) (male) 07/30/2023   Carpal tunnel syndrome 06/11/2023   Chronic ulcer of buttock (HCC) 06/11/2023   Congestive heart failure (HCC) 06/11/2023   Muscle weakness 06/11/2023   Pneumonia 06/11/2023   Coronary arteriosclerosis 06/11/2023   Essential hypertension 06/11/2023   Hyperlipidemia 06/11/2023   Primary stress urinary incontinence 06/11/2023   Cholelithiasis 01/26/2023   Intractable lower abdominal pain 01/26/2023   Prolonged QT interval 12/21/2022   Malnutrition of moderate degree 12/19/2022   Persistent atrial fibrillation (HCC) 12/15/2022   Chronic combined systolic and diastolic CHF (congestive heart failure) (HCC) 12/15/2022   Pressure injury of skin 12/15/2022   Low back pain 08/07/2020   Trigger  finger of right thumb 04/17/2020   Carpal tunnel syndrome, bilateral 09/26/2019   Shoulder pain, bilateral 08/03/2019   Cervicalgia 03/28/2018   GERD (gastroesophageal reflux disease) 04/10/2013   HTN (hypertension) 04/10/2013   CAD (coronary artery disease) of artery bypass graft 10/04/2012   Hypothyroidism 10/04/2012   Hyperlipidemia with target LDL less than 70 10/04/2012   Kidney stones 01/27/2012   Hx of appendectomy-history of ruptured requiring ileocecectomy (~1994) 01/27/2012   Past Medical History:  Diagnosis Date   Blood transfusion without reported diagnosis    CAD (coronary artery disease)    GERD (gastroesophageal reflux disease)    Heart attack (HCC)    Hyperlipidemia    Hypertension 03/07/2012   ECHO-WNL     08/12/11 Lexiscan MyoviewNo significant ischemia demonstrated Low risk scan There is a moderate sized dense scar in the LCX territoy unchanged from the prior study.. Post- stress EF is 40%.   Septic shock (HCC) 12/10/2022   Thyroid  disease    Past Surgical History:  Procedure Laterality Date   APPENDECTOMY     CERVICAL SPINE SURGERY     titanium plate in the back of neck   CORONARY ARTERY BYPASS GRAFT     HERNIA REPAIR     SMALL INTESTINE SURGERY     THYROID  SURGERY     1/2 thyroid  removed on right side   Allergies[1] Prior to Admission medications  Medication Sig Start Date End Date Taking? Authorizing Provider  acetaminophen  (TYLENOL ) 325 MG tablet Take 650 mg by mouth every 6 (six) hours as needed.   Yes [provider]  cyanocobalamin  (VITAMIN B12) 1000 MCG tablet Take 1 tablet (1,000 mcg total) by mouth daily. 12/28/23  Yes Levora Reyes SAUNDERS, MD  docusate sodium  (COLACE) 50 MG capsule Take 50 mg by mouth daily.   Yes [provider]  ELIQUIS  5 MG TABS tablet Take 1 tablet by mouth twice daily 03/06/24  Yes Levora Reyes SAUNDERS, MD  Evolocumab  (REPATHA  SURECLICK) 140 MG/ML SOAJ Inject 140 mg into the skin every 14 (fourteen) days. 03/21/24   Yes Monge, Damien BROCKS, NP  ezetimibe  (ZETIA ) 10 MG tablet Take 1 tablet (10 mg total) by mouth daily. 09/08/23  Yes Burnard Debby LABOR, MD  folic acid  (FOLVITE ) 1 MG tablet Take 1 tablet (1 mg total) by mouth daily. 03/01/23  Yes Levora Reyes SAUNDERS, MD  magnesium  chloride (SLOW-MAG) 64 MG TBEC SR tablet Take 1 tablet (64 mg total) by mouth daily. 06/01/23  Yes Levora Reyes SAUNDERS, MD  memantine  (NAMENDA ) 5 MG tablet Take 1 tablet (5 mg at night) for 2 weeks, then increase to 1 tablet (5 mg) twice a day 10/14/23  Yes Levora Reyes SAUNDERS, MD  mirtazapine  (REMERON  SOL-TAB) 15 MG disintegrating tablet DISSOLVE 1 TABLET BY MOUTH AT BEDTIME 03/27/24  Yes Levora Reyes SAUNDERS, MD  nitroGLYCERIN  (NITROSTAT ) 0.4 MG SL tablet Place 1 tablet (0.4 mg total) under the tongue every 5 (five) minutes as needed for chest pain. If med is needed, be seen by medical provider immediately. 06/11/23  Yes Levora Reyes SAUNDERS, MD  oxybutynin  (DITROPAN ) 5 MG tablet Take 1 tablet (5 mg total) by mouth 2 (two) times daily. 12/31/23  Yes Levora Reyes SAUNDERS, MD  pantoprazole  (PROTONIX ) 40 MG tablet Take 1 tablet by mouth once daily 02/28/24  Yes Levora Reyes SAUNDERS, MD  potassium chloride  SA (KLOR-CON  M) 20 MEQ tablet Take 1 tablet by mouth once daily 01/31/24  Yes Levora Reyes SAUNDERS, MD  rosuvastatin  (CRESTOR ) 20 MG tablet Take 1 tablet (20 mg total) by mouth daily. 10/14/23  Yes Levora Reyes SAUNDERS, MD  torsemide  (DEMADEX ) 20 MG tablet TAKE 1/2 TO 1 (ONE-HALF TO ONE) TABLET BY MOUTH ONCE DAILY 02/29/24  Yes Levora Reyes SAUNDERS, MD   Social History   Socioeconomic History   Marital status: Married    Spouse name: Not on file   Number of children: 0   Years of education: 16   Highest education level: Some college, no degree  Occupational History   Occupation: Retired  Tobacco Use   Smoking status: Never   Smokeless tobacco: Never  Vaping Use   Vaping status: Never Used  Substance and Sexual Activity   Alcohol use: No    Alcohol/week: 0.0 standard  drinks of alcohol   Drug use: No   Sexual activity: Never  Other Topics Concern   Not on file  Social History Narrative   Married.    One floor home    Around the clock care   Drinks caffeine   Helayne is the brother   Social Drivers of Health   Tobacco Use: Low Risk (04/07/2024)   Patient History    Smoking Tobacco Use: Never    Smokeless Tobacco Use: Never    Passive Exposure: Not on file  Financial Resource Strain: Low Risk (07/16/2022)   Overall Financial Resource Strain (CARDIA)    Difficulty of Paying Living Expenses: Not hard at all  Food Insecurity: No Food Insecurity (01/26/2023)   Hunger Vital Sign    Worried About Running Out of Food in the Last Year: Never true    Ran Out of Food in the Last Year: Never true  Transportation Needs: No Transportation Needs (01/26/2023)   PRAPARE - Administrator, Civil Service (Medical): No    Lack of Transportation (Non-Medical): No  Physical Activity: Insufficiently Active (07/16/2022)   Exercise Vital Sign    Days of Exercise per Week: 3 days    Minutes of Exercise per Session: 30 min  Stress: No Stress Concern Present (07/16/2022)   [employer] of Occupational Health - Occupational Stress Questionnaire    Feeling of Stress : Not at all  Social Connections: Moderately Isolated (07/16/2022)   Social Connection and Isolation Panel    Frequency of Communication with Friends and Family: Three times a week    Frequency of Social Gatherings with Friends and Family: Three times a week    Attends Religious Services: Never    Active Member of Clubs or Organizations: No    Attends Banker Meetings: Never    Marital Status: Married  Catering Manager Violence: Not At Risk (01/26/2023)   Humiliation, Afraid, Rape, and Kick questionnaire    Fear of Current or Ex-Partner: No    Emotionally Abused: No  Physically Abused: No    Sexually Abused: No  Depression (PHQ2-9): Low Risk (04/07/2024)   Depression (PHQ2-9)     PHQ-2 Score: 0  Alcohol Screen: Low Risk (07/16/2022)   Alcohol Screen    Last Alcohol Screening Score (AUDIT): 0  Housing: Low Risk (01/26/2023)   Housing    Last Housing Risk Score: 0  Utilities: Not At Risk (01/26/2023)   AHC Utilities    Threatened with loss of utilities: No  Health Literacy: Not on file    Review of Systems Per HPI.   Objective:   Vitals:   04/07/24 1026  BP: (!) 116/56  Pulse: 77  Resp: 12  Temp: 98.6 F (37 C)  TempSrc: Temporal  SpO2: 97%  Weight: 158 lb 12.8 oz (72 kg)  Height: 5' 7 (1.702 m)     Physical Exam Vitals reviewed.  Constitutional:      Appearance: He is well-developed.  HENT:     Head: Normocephalic and atraumatic.  Neck:     Vascular: No carotid bruit or JVD.  Cardiovascular:     Rate and Rhythm: Normal rate and regular rhythm.     Heart sounds: Normal heart sounds. No murmur heard. Pulmonary:     Effort: Pulmonary effort is normal.     Breath sounds: Normal breath sounds. No rales.  Abdominal:     General: There is no distension.     Tenderness: There is no abdominal tenderness. There is no right CVA tenderness, left CVA tenderness, guarding or rebound.  Musculoskeletal:     Right lower leg: No edema.     Left lower leg: No edema.  Skin:    General: Skin is warm and dry.  Neurological:     Mental Status: He is alert and oriented to person, place, and time.  Psychiatric:        Mood and Affect: Mood normal.     Results for orders placed or performed in visit on 04/07/24  PSA   Collection Time: 04/07/24 10:37 AM  Result Value Ref Range   PSA 0.49 0.10 - 4.00 ng/mL  Basic metabolic panel with GFR   Collection Time: 04/07/24 11:05 AM  Result Value Ref Range   Sodium 139 135 - 145 mEq/L   Potassium 4.4 3.5 - 5.1 mEq/L   Chloride 101 96 - 112 mEq/L   CO2 29 19 - 32 mEq/L   Glucose, Bld 110 (H) 70 - 99 mg/dL   BUN 22 6 - 23 mg/dL   Creatinine, Ser 8.54 0.40 - 1.50 mg/dL   GFR 55.79 (L) >39.99 mL/min    Calcium  9.6 8.4 - 10.5 mg/dL  POCT urinalysis dipstick   Collection Time: 04/07/24 11:10 AM  Result Value Ref Range   Color, UA yellow yellow   Clarity, UA clear clear   Glucose, UA negative negative mg/dL   Bilirubin, UA negative negative   Ketones, POC UA negative negative mg/dL   Spec Grav, UA 8.974 8.989 - 1.025   Blood, UA small (A) negative   pH, UA 5.5 5.0 - 8.0   Protein Ur, POC negative negative mg/dL   Urobilinogen, UA 0.2 0.2 or 1.0 E.U./dL   Nitrite, UA Negative Negative   Leukocytes, UA Negative Negative      Assessment & Plan:  Bradley Oconnor is a 84 y.o. male . Urinary frequency - Plan: POCT urinalysis dipstick, PSA, Ambulatory referral to Urology Nocturia - Plan: POCT urinalysis dipstick, PSA, Ambulatory referral to Urology Elevated serum creatinine -  Plan: Basic metabolic panel with GFR  - Persistent nocturia, frequency.  Slight improvement with oxybutynin  but does have some side effects with the dry mouth.  PSA was reassuring.  Creatinine stable.  Urinalysis as above with hematuria.  Will refer to urology for further evaluation and treatment options.   Adjustment disorder, unspecified type  - Stable with current med regimen, continue same  Coronary artery disease involving coronary bypass graft of native heart with angina pectoris  - Tolerating current med regimen, no changes.  Will be starting Repatha  with home dosing/injection, questions can be discussed with pharmacist if needed.  No orders of the defined types were placed in this encounter.  Patient Instructions  Thank you for coming in today. No change in medications at this time.  I will refer you to urology to discuss the frequent urination and other treatment options.  Unfortunately I do think the oxybutynin  is contributing to the dry mouth.  I am checking some urine testing as well as prostate testing today but you should hear from the urology office soon.  Let me know if you have not heard from them  in the next 2 weeks.  I am okay with the starting Repatha  under the recommendations from cardiology.  That can be given at home.  If any concerns regarding that injection, please reach out to cardiology or the pharmacist that met with you in December.  Glad to hear you are doing well.  If there are any concerns on your bloodwork, I will let you know. Take care!     Signed,   Reyes Pines, MD [facility], [facility] 04/07/2024 10:56 AM      [1]  Allergies Allergen Reactions   Procardia [Nifedipine] Other (See Comments)    Hypotension    Phenergan [Promethazine Hcl] Nausea And Vomiting   "

## 2024-04-09 ENCOUNTER — Ambulatory Visit: Payer: Self-pay | Admitting: Family Medicine

## 2024-04-16 ENCOUNTER — Other Ambulatory Visit: Payer: Self-pay | Admitting: Family Medicine

## 2024-04-17 ENCOUNTER — Telehealth: Payer: Self-pay | Admitting: Family Medicine

## 2024-04-17 NOTE — Telephone Encounter (Signed)
 Noted.

## 2024-04-17 NOTE — Progress Notes (Signed)
 Called patients wife (okay per DPR). She verbalized understanding. Gave her number for urology.

## 2024-04-17 NOTE — Telephone Encounter (Signed)
 Copied from CRM [id number]. Topic: General - Other >> Apr 17, 2024 12:31 PM Mesmerise C wrote: Reason for CRM: Patient's care taker Suzen wanted to pass a message to Duwaine that patient has an appt with Dr. Gaston on 2/3 at 10:30

## 2024-04-19 ENCOUNTER — Other Ambulatory Visit: Payer: Self-pay | Admitting: Family Medicine

## 2024-04-19 DIAGNOSIS — F4320 Adjustment disorder, unspecified: Secondary | ICD-10-CM

## 2024-04-19 NOTE — Telephone Encounter (Signed)
 Requested Prescriptions   Pending Prescriptions Disp Refills   mirtazapine  (REMERON  SOL-TAB) 15 MG disintegrating tablet [Pharmacy Med Name: Mirtazapine  15 MG Oral Tablet Disintegrating] 30 tablet 0    Sig: DISSOLVE 1 TABLET BY MOUTH AT BEDTIME     Date of patient request: 04/19/2024 Last office visit: 04/07/2024 Upcoming visit: 07/14/2024 Date of last refill: 03/27/2025 Last refill amount: 30

## 2024-05-02 ENCOUNTER — Emergency Department (HOSPITAL_COMMUNITY)

## 2024-05-02 ENCOUNTER — Other Ambulatory Visit: Payer: Self-pay

## 2024-05-02 ENCOUNTER — Observation Stay (HOSPITAL_COMMUNITY)
Admission: EM | Admit: 2024-05-02 | Discharge: 2024-05-05 | Disposition: A | Source: Home / Self Care | Attending: Emergency Medicine | Admitting: Emergency Medicine

## 2024-05-02 ENCOUNTER — Encounter (HOSPITAL_COMMUNITY): Payer: Self-pay

## 2024-05-02 DIAGNOSIS — I25810 Atherosclerosis of coronary artery bypass graft(s) without angina pectoris: Secondary | ICD-10-CM | POA: Diagnosis present

## 2024-05-02 DIAGNOSIS — K219 Gastro-esophageal reflux disease without esophagitis: Secondary | ICD-10-CM | POA: Diagnosis present

## 2024-05-02 DIAGNOSIS — N184 Chronic kidney disease, stage 4 (severe): Secondary | ICD-10-CM | POA: Insufficient documentation

## 2024-05-02 DIAGNOSIS — E039 Hypothyroidism, unspecified: Secondary | ICD-10-CM | POA: Diagnosis present

## 2024-05-02 DIAGNOSIS — R079 Chest pain, unspecified: Secondary | ICD-10-CM | POA: Diagnosis present

## 2024-05-02 DIAGNOSIS — I10 Essential (primary) hypertension: Secondary | ICD-10-CM | POA: Diagnosis present

## 2024-05-02 DIAGNOSIS — I214 Non-ST elevation (NSTEMI) myocardial infarction: Secondary | ICD-10-CM

## 2024-05-02 DIAGNOSIS — I200 Unstable angina: Secondary | ICD-10-CM | POA: Diagnosis present

## 2024-05-02 DIAGNOSIS — R350 Frequency of micturition: Secondary | ICD-10-CM

## 2024-05-02 DIAGNOSIS — R1013 Epigastric pain: Principal | ICD-10-CM

## 2024-05-02 DIAGNOSIS — I5042 Chronic combined systolic (congestive) and diastolic (congestive) heart failure: Secondary | ICD-10-CM | POA: Diagnosis present

## 2024-05-02 DIAGNOSIS — E785 Hyperlipidemia, unspecified: Secondary | ICD-10-CM | POA: Diagnosis present

## 2024-05-02 DIAGNOSIS — F4320 Adjustment disorder, unspecified: Secondary | ICD-10-CM

## 2024-05-02 DIAGNOSIS — I4819 Other persistent atrial fibrillation: Secondary | ICD-10-CM | POA: Diagnosis not present

## 2024-05-02 LAB — BASIC METABOLIC PANEL WITH GFR
Anion gap: 16 — ABNORMAL HIGH (ref 5–15)
BUN: 22 mg/dL (ref 8–23)
CO2: 20 mmol/L — ABNORMAL LOW (ref 22–32)
Calcium: 9.1 mg/dL (ref 8.9–10.3)
Chloride: 103 mmol/L (ref 98–111)
Creatinine, Ser: 1.58 mg/dL — ABNORMAL HIGH (ref 0.61–1.24)
GFR, Estimated: 43 mL/min — ABNORMAL LOW
Glucose, Bld: 129 mg/dL — ABNORMAL HIGH (ref 70–99)
Potassium: 4.1 mmol/L (ref 3.5–5.1)
Sodium: 139 mmol/L (ref 135–145)

## 2024-05-02 LAB — CBC
HCT: 43.8 % (ref 39.0–52.0)
Hemoglobin: 14 g/dL (ref 13.0–17.0)
MCH: 29.7 pg (ref 26.0–34.0)
MCHC: 32 g/dL (ref 30.0–36.0)
MCV: 93 fL (ref 80.0–100.0)
Platelets: 190 10*3/uL (ref 150–400)
RBC: 4.71 MIL/uL (ref 4.22–5.81)
RDW: 13.8 % (ref 11.5–15.5)
WBC: 9.4 10*3/uL (ref 4.0–10.5)
nRBC: 0 % (ref 0.0–0.2)

## 2024-05-02 LAB — HEPATIC FUNCTION PANEL
ALT: 35 U/L (ref 0–44)
AST: 36 U/L (ref 15–41)
Albumin: 3.9 g/dL (ref 3.5–5.0)
Alkaline Phosphatase: 99 U/L (ref 38–126)
Bilirubin, Direct: 0.3 mg/dL — ABNORMAL HIGH (ref 0.0–0.2)
Indirect Bilirubin: 0.4 mg/dL (ref 0.3–0.9)
Total Bilirubin: 0.6 mg/dL (ref 0.0–1.2)
Total Protein: 7.4 g/dL (ref 6.5–8.1)

## 2024-05-02 LAB — LIPASE, BLOOD: Lipase: 24 U/L (ref 11–51)

## 2024-05-02 LAB — TROPONIN T, HIGH SENSITIVITY
Troponin T High Sensitivity: 62 ng/L — ABNORMAL HIGH (ref 0–19)
Troponin T High Sensitivity: 57 ng/L — ABNORMAL HIGH (ref 0–19)

## 2024-05-02 MED ORDER — MAGNESIUM CHLORIDE 64 MG PO TBEC
1.0000 | DELAYED_RELEASE_TABLET | Freq: Every day | ORAL | Status: DC
  Administered 2024-05-04 – 2024-05-05 (×2): 64 mg via ORAL
  Filled 2024-05-02 (×3): qty 1

## 2024-05-02 MED ORDER — ALUM & MAG HYDROXIDE-SIMETH 200-200-20 MG/5ML PO SUSP
30.0000 mL | Freq: Once | ORAL | Status: AC
  Administered 2024-05-02: 30 mL via ORAL
  Filled 2024-05-02: qty 30

## 2024-05-02 MED ORDER — MIRTAZAPINE 15 MG PO TBDP
15.0000 mg | ORAL_TABLET | Freq: Every day | ORAL | Status: DC
  Administered 2024-05-03 – 2024-05-04 (×3): 15 mg via ORAL
  Filled 2024-05-02 (×4): qty 1

## 2024-05-02 MED ORDER — OXYBUTYNIN CHLORIDE 5 MG PO TABS
5.0000 mg | ORAL_TABLET | Freq: Two times a day (BID) | ORAL | Status: DC
  Administered 2024-05-03 – 2024-05-05 (×5): 5 mg via ORAL
  Filled 2024-05-02 (×8): qty 1

## 2024-05-02 MED ORDER — MEMANTINE HCL 10 MG PO TABS
5.0000 mg | ORAL_TABLET | Freq: Two times a day (BID) | ORAL | Status: DC
  Administered 2024-05-03 – 2024-05-05 (×6): 5 mg via ORAL
  Filled 2024-05-02 (×6): qty 1

## 2024-05-02 MED ORDER — SODIUM CHLORIDE 0.9 % IV SOLN: INTRAVENOUS | Status: DC

## 2024-05-02 MED ORDER — ACETAMINOPHEN 325 MG PO TABS: 650.0000 mg | ORAL_TABLET | Freq: Four times a day (QID) | ORAL | Status: DC | PRN

## 2024-05-02 MED ORDER — ACETAMINOPHEN 650 MG RE SUPP: 650.0000 mg | Freq: Four times a day (QID) | RECTAL | Status: DC | PRN

## 2024-05-02 MED ORDER — IOHEXOL 350 MG/ML SOLN
100.0000 mL | Freq: Once | INTRAVENOUS | Status: AC | PRN
  Administered 2024-05-02: 100 mL via INTRAVENOUS

## 2024-05-02 MED ORDER — EZETIMIBE 10 MG PO TABS
10.0000 mg | ORAL_TABLET | Freq: Every day | ORAL | Status: DC
  Administered 2024-05-03 – 2024-05-05 (×3): 10 mg via ORAL
  Filled 2024-05-02 (×3): qty 1

## 2024-05-02 MED ORDER — ROSUVASTATIN CALCIUM 20 MG PO TABS
20.0000 mg | ORAL_TABLET | Freq: Every day | ORAL | Status: DC
  Administered 2024-05-03 – 2024-05-05 (×3): 20 mg via ORAL
  Filled 2024-05-02 (×3): qty 1

## 2024-05-02 MED ORDER — SODIUM CHLORIDE 0.9 % IV BOLUS
500.0000 mL | Freq: Once | INTRAVENOUS | Status: AC
  Administered 2024-05-02: 500 mL via INTRAVENOUS

## 2024-05-02 MED ORDER — FOLIC ACID 1 MG PO TABS
1.0000 mg | ORAL_TABLET | Freq: Every day | ORAL | Status: DC
  Administered 2024-05-04 – 2024-05-05 (×2): 1 mg via ORAL
  Filled 2024-05-02 (×3): qty 1

## 2024-05-02 MED ORDER — PANTOPRAZOLE SODIUM 40 MG PO TBEC
40.0000 mg | DELAYED_RELEASE_TABLET | Freq: Every day | ORAL | Status: DC
  Administered 2024-05-03 – 2024-05-05 (×3): 40 mg via ORAL
  Filled 2024-05-02 (×3): qty 1

## 2024-05-02 MED ORDER — VITAMIN B-12 1000 MCG PO TABS
1000.0000 ug | ORAL_TABLET | Freq: Every day | ORAL | Status: DC
  Administered 2024-05-03 – 2024-05-05 (×3): 1000 ug via ORAL
  Filled 2024-05-02 (×3): qty 1

## 2024-05-02 MED ORDER — DOCUSATE SODIUM 50 MG PO CAPS
50.0000 mg | ORAL_CAPSULE | Freq: Every day | ORAL | Status: DC
  Administered 2024-05-05: 50 mg via ORAL
  Filled 2024-05-02 (×3): qty 1

## 2024-05-02 NOTE — ED Notes (Signed)
 Patient transported to X-ray

## 2024-05-02 NOTE — ED Triage Notes (Signed)
 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  Took 324 ASA and 0.4 nitroglycerin  prior to EMS arrival. No reliev with nitroglycerin , but morphine  did help.  Initial BP 80/palp.  Given 4 mg morphine , 4 mg zofran , and 500 mL NS.  Current BP 98/63 98% RA.  20G L AC

## 2024-05-02 NOTE — ED Notes (Signed)
 Patient transported to CT

## 2024-05-03 ENCOUNTER — Encounter (HOSPITAL_COMMUNITY): Admission: EM | Disposition: A | Payer: Self-pay | Source: Home / Self Care | Attending: Emergency Medicine

## 2024-05-03 ENCOUNTER — Encounter (HOSPITAL_COMMUNITY): Payer: Self-pay | Admitting: Internal Medicine

## 2024-05-03 DIAGNOSIS — I200 Unstable angina: Secondary | ICD-10-CM | POA: Diagnosis present

## 2024-05-03 LAB — BASIC METABOLIC PANEL WITH GFR
Anion gap: 9 (ref 5–15)
BUN: 21 mg/dL (ref 8–23)
CO2: 24 mmol/L (ref 22–32)
Calcium: 8.8 mg/dL — ABNORMAL LOW (ref 8.9–10.3)
Chloride: 105 mmol/L (ref 98–111)
Creatinine, Ser: 1.41 mg/dL — ABNORMAL HIGH (ref 0.61–1.24)
GFR, Estimated: 49 mL/min — ABNORMAL LOW
Glucose, Bld: 123 mg/dL — ABNORMAL HIGH (ref 70–99)
Potassium: 4.9 mmol/L (ref 3.5–5.1)
Sodium: 137 mmol/L (ref 135–145)

## 2024-05-03 LAB — CBC
HCT: 39.6 % (ref 39.0–52.0)
Hemoglobin: 12.7 g/dL — ABNORMAL LOW (ref 13.0–17.0)
MCH: 29.7 pg (ref 26.0–34.0)
MCHC: 32.1 g/dL (ref 30.0–36.0)
MCV: 92.7 fL (ref 80.0–100.0)
Platelets: 150 10*3/uL (ref 150–400)
RBC: 4.27 MIL/uL (ref 4.22–5.81)
RDW: 13.9 % (ref 11.5–15.5)
WBC: 7.8 10*3/uL (ref 4.0–10.5)
nRBC: 0 % (ref 0.0–0.2)

## 2024-05-03 MED ORDER — MIDAZOLAM HCL 2 MG/2ML IJ SOLN
INTRAMUSCULAR | Status: AC
  Filled 2024-05-03: qty 2

## 2024-05-03 MED ORDER — HEPARIN (PORCINE) 25000 UT/250ML-% IV SOLN
1000.0000 [IU]/h | INTRAVENOUS | Status: DC
  Administered 2024-05-03: 1000 [IU]/h via INTRAVENOUS
  Filled 2024-05-03: qty 250

## 2024-05-03 MED ORDER — FREE WATER
500.0000 mL | Freq: Once | Status: AC
  Administered 2024-05-03: 500 mL via ORAL

## 2024-05-03 MED ORDER — MIDAZOLAM HCL (PF) 2 MG/2ML IJ SOLN
INTRAMUSCULAR | Status: DC | PRN
  Administered 2024-05-03: 1 mg via INTRAVENOUS

## 2024-05-03 MED ORDER — IOHEXOL 350 MG/ML SOLN
INTRAVENOUS | Status: DC | PRN
  Administered 2024-05-03: 70 mL

## 2024-05-03 MED ORDER — SODIUM CHLORIDE 0.9% FLUSH: 3.0000 mL | INTRAVENOUS | Status: DC | PRN

## 2024-05-03 MED ORDER — ASPIRIN 81 MG PO CHEW
81.0000 mg | CHEWABLE_TABLET | ORAL | Status: AC
  Administered 2024-05-03: 81 mg via ORAL
  Filled 2024-05-03: qty 1

## 2024-05-03 MED ORDER — SODIUM CHLORIDE 0.9% FLUSH
3.0000 mL | Freq: Two times a day (BID) | INTRAVENOUS | Status: DC
  Administered 2024-05-03 – 2024-05-05 (×4): 3 mL via INTRAVENOUS

## 2024-05-03 MED ORDER — ASPIRIN 81 MG PO CHEW: 81.0000 mg | CHEWABLE_TABLET | ORAL | Status: DC

## 2024-05-03 MED ORDER — HEPARIN (PORCINE) IN NACL 1000-0.9 UT/500ML-% IV SOLN
INTRAVENOUS | Status: DC | PRN
  Administered 2024-05-03 (×2): 500 mL

## 2024-05-03 MED ORDER — HEPARIN SODIUM (PORCINE) 1000 UNIT/ML IJ SOLN
INTRAMUSCULAR | Status: DC | PRN
  Administered 2024-05-03: 4000 [IU] via INTRAVENOUS

## 2024-05-03 MED ORDER — LABETALOL HCL 5 MG/ML IV SOLN: 10.0000 mg | INTRAVENOUS | Status: AC | PRN

## 2024-05-03 MED ORDER — LIDOCAINE HCL (PF) 1 % IJ SOLN
INTRAMUSCULAR | Status: DC | PRN
  Administered 2024-05-03: 2 mL via INTRADERMAL

## 2024-05-03 MED ORDER — ACETAMINOPHEN 325 MG PO TABS: 650.0000 mg | ORAL_TABLET | ORAL | Status: DC | PRN

## 2024-05-03 MED ORDER — HYDRALAZINE HCL 20 MG/ML IJ SOLN: 10.0000 mg | INTRAMUSCULAR | Status: AC | PRN

## 2024-05-03 MED ORDER — FENTANYL CITRATE (PF) 100 MCG/2ML IJ SOLN
INTRAMUSCULAR | Status: AC
  Filled 2024-05-03: qty 2

## 2024-05-03 MED ORDER — FENTANYL CITRATE (PF) 100 MCG/2ML IJ SOLN
INTRAMUSCULAR | Status: DC | PRN
  Administered 2024-05-03: 25 ug via INTRAVENOUS

## 2024-05-03 MED ORDER — VERAPAMIL HCL 2.5 MG/ML IV SOLN
INTRAVENOUS | Status: AC
  Filled 2024-05-03: qty 2

## 2024-05-03 MED ORDER — VERAPAMIL HCL 2.5 MG/ML IV SOLN
INTRAVENOUS | Status: DC | PRN
  Administered 2024-05-03: 10 mL via INTRA_ARTERIAL

## 2024-05-03 MED ORDER — HEPARIN SODIUM (PORCINE) 1000 UNIT/ML IJ SOLN
INTRAMUSCULAR | Status: AC
  Filled 2024-05-03: qty 10

## 2024-05-03 MED ORDER — SODIUM CHLORIDE 0.9 % IV SOLN: 250.0000 mL | INTRAVENOUS | Status: AC | PRN

## 2024-05-03 NOTE — Progress Notes (Addendum)
 "  Progress Note  Patient Name: Bradley Oconnor Date of Encounter: 05/03/2024 [facility] Cardiologist: Bradley Clay, MD   Interval Summary    No further chest pain since last evening.   Vital Signs Vitals:   05/03/24 0700 05/03/24 0730 05/03/24 0800 05/03/24 0902  BP: 107/60 (!) 91/48 (!) 101/49   Pulse: (!) 49 (!) 51 (!) 50   Resp: 13 15 15    Temp:    97.8 F (36.6 C)  TempSrc:    Oral  SpO2: 100% 100% 98%   Weight:      Height:        Intake/Output Summary (Last 24 hours) at 05/03/2024 0936 Last data filed at 05/02/2024 1740 Gross per 24 hour  Intake 500 ml  Output --  Net 500 ml      05/02/2024    1:37 PM 04/07/2024   10:26 AM 01/13/2024    9:30 AM  Last 3 Weights  Weight (lbs) 158 lb 158 lb 12.8 oz 157 lb 9.6 oz  Weight (kg) 71.668 kg 72.031 kg 71.487 kg      Telemetry/ECG  Sinus bradycardia, 50s- Personally Reviewed  Physical Exam  GEN: No acute distress.  Resting comfortably in bed.  No acute distress. Neck: No JVD Cardiac: RRR, no murmurs, rubs, or gallops.  Respiratory: Clear to auscultation bilaterally. GI: Soft, nontender, non-distended  MS: No edema  Assessment & Plan   84 year old male with past medical history of CAD status post 4v CABG '93 (LIMA-LAD, SVG-diagonal, SVG-OM, SVG-PDA), DES to RCA '02, PE/DVT, paroxysmal atrial fibrillation, chronic systolic heart failure, mitral valve regurgitation, aortic valve regurgitation, hypertension, hyperlipidemia, anemia, GERD who presented with chest pain.   Unstable angina -- Presented with an episode of centralized chest pressure which lingered through the morning.  EMS was called and he was given sublingual nitroglycerin  with minimal improvement. Pain essentially subsided on its own. -- High-sensitivity troponin 62>>57 -- Given his symptoms, it is reasonable to proceed with cardiac catheterization given concerns for unstable angina.  After discussion, the patient himself also agrees that this is the  best option. -- Last dose of Eliquis  was 10:30 AM 2/3  Informed Consent   Shared Decision Making/Informed Consent The risks [stroke (1 in 1000), death (1 in 1000), kidney failure [usually temporary] (1 in 500), bleeding (1 in 200), allergic reaction [possibly serious] (1 in 200)], benefits (diagnostic support and management of coronary artery disease) and alternatives of a cardiac catheterization were discussed in detail with Bradley Oconnor and he is willing to proceed.    Chronic combined heart failure -- Echo 02/2024 LVEF of 20 to 30% => managed with torsemide  only.  Euvolemic on exam. -- GDMT: Has had issues with escalation of therapy in the setting of hypotension.   Paroxysmal atrial fibrillation -- Initially diagnosed during hospitalization 11/2022 in the setting of sepsis pneumonia -- Previously on amiodarone  however discontinued in the setting of QT prolongation -- As above Eliquis  currently held with plans for cardiac catheterization => will need to restart post cath.  If PCI attempted, would use Plavix .  CKD stage IIIb -- Creatinine around 1.4, was elevated to 1.58 on admission now improved -- torsemide  held  -- Received IV fluids, will hold additional IV fluids and give precath p.o. fluids as his GFR is greater than 45  History of PE/DVT -- Would plan to resume Eliquis  post procedure   Valvular heart disease -- Echo 02/2024 with trivial MR, mild to moderate moderate AI  Hyperlipidemia --  LDL 75 -- On Repatha , Crestor  and Zetia   For questions or updates, please contact [facility] Please consult [email or web address] for contact info under         Signed, Bradley Rummer, NP     ATTENDING ATTESTATION  I have seen, examined and evaluated the patient this morning on rounds along with Bradley Rummer, NP.  After reviewing all the available data and chart, we discussed the patients laboratory, study & physical findings as well as symptoms in detail.  I agree with her  findings, examination as well as impression recommendations as per our discussion.    Attending adjustments noted in italics.   Patient will see me, my primary cardiology patient followed by Dr. Burnard for many years dating back to the mid 1990s who now presents with unstable angina symptoms.  He has known CAD with an occluded RCA and occluded SVG to RPDA.  Occluded LCx with the SVG to OM, 80% proximal LAD in the past with SVG to diagonal branch and LIMA to LAD.  Has not had any symptoms at all from a cardiac angina standpoint until last night.  He has not had recent heart failure symptoms either.  Plans for cardiac catheterization today which is not unreasonable.  My concern is that he may have lost one of his grafts that is over 56 years old and may not be favorable for PCI.  I suspect his native vessels are probably significant enough that no native vessel PCI would be available either.  Further plans based on cath results    Bradley MICAEL Clay, MD, MS Bradley Oconnor, M.D., M.S. Interventional Cardiologist  [facility] Pager # [phone]    "

## 2024-05-03 NOTE — Interval H&P Note (Signed)
 History and Physical Interval Note:  05/03/2024 4:25 PM  Bradley Oconnor  has presented today for surgery, with the diagnosis of NSTEMI.  The various methods of treatment have been discussed with the patient and family. After consideration of risks, benefits and other options for treatment, the patient has consented to  Procedures: LEFT HEART CATH AND CORS/GRAFTS ANGIOGRAPHY (N/A) as a surgical intervention.  The patient's history has been reviewed, patient examined, no change in status, stable for surgery.  I have reviewed the patient's chart and labs.  Questions were answered to the patient's satisfaction.     Caitlin Ainley J Darrik Richman

## 2024-05-03 NOTE — Progress Notes (Signed)
 "          [facility]                                                                              Lyndel Sarate, is a 84 y.o. male, DOB - 08-15-1940, FMW:[id number] Admit date - 05/02/2024    Outpatient Primary MD for the patient is Levora Reyes SAUNDERS, MD  LOS - 0  days  Chief Complaint  Patient presents with   Chest Pain   Hypotension       Brief summary   Patient is a 84 year old male with CAD s/p CABG and DES, chronic systolic CHF, CKD stage III, paroxysmal A-fib presented to ED with chest and epigastric pain at home.  Patient reported that he woke up in the morning and was doing fine but later he started developing some epigastric discomfort that resolved without any intervention.  Later when he ate an apple, his epigastric and chest pain came back with nausea and vomiting. He was concerned because he had similar symptoms when he previously had ACS.   ED Course: In the ER labs show troponin of 62.57 hemoglobin 14 creatinine 1.5.  CTA chest abdomen pelvis with no acute abnormality.   Cardiology consulted, plan for cardiac cath.  Assessment & Plan     Chest pain/unstable angina (HCC) In the setting of known CAD (coronary artery disease), CABG, DES -Cardiology consulted, continue to hold Eliquis , plan for cardiac cath today. - N.p.o.    Chronic combined systolic and diastolic CHF (congestive heart failure) (HCC) - 2D echo 02/2024 showed EF of 30 to 35% with G1 DD - Receiving gentle hydration and in the anticipation of cardiac cath, hold torsemide  - GDMT limited due to hypotension.  Hyperlipidemia - Continue statin, Zetia   Paroxysmal atrial fibrillation - Rate controlled, bradycardia - Eliquis  on hold for cardiac cath   History of PE/DVT - Eliquis  currently on hold for cardiac cath    Moderate aortic valve regurgitation - 2D echo 12//25 that showed mild to moderate AR, cardiology following  CKD stage IIIb - Creatinine 1.58 on admission, baseline 1.4 -  Torsemide  held, currently on gentle hydration for anticipated cardiac cath   Estimated body mass index is 24.75 kg/m as calculated from the following:   Height as of this encounter: 5' 7 (1.702 m).   Weight as of this encounter: 71.7 kg.  Code Status: Full CODE STATUS DVT Prophylaxis:     Level of Care: Level of care: Telemetry Family Communication: Updated patient Disposition Plan:      Remains inpatient appropriate: Cardiac cath today   Procedures:    Consultants:   Cardiology  Antimicrobials:   Anti-infectives (From admission, onward)    None          Medications  cyanocobalamin   1,000 mcg Oral Daily   docusate sodium   50 mg Oral Daily   ezetimibe   10 mg Oral Daily   folic acid   1 mg Oral Daily   magnesium  chloride  1 tablet Oral Daily   memantine   5 mg Oral BID   mirtazapine   15 mg Oral QHS   oxybutynin   5 mg Oral BID   pantoprazole   40 mg Oral  Daily   rosuvastatin   20 mg Oral Daily      Subjective:   Rashaun Wichert was seen and examined today.  Currently no chest pain or epigastric pain.  Awaiting cardiac cath.  Patient denies dizziness, shortness of breath, abdominal pain, N/V.  Objective:   Vitals:   05/03/24 0700 05/03/24 0730 05/03/24 0800 05/03/24 0902  BP: 107/60 (!) 91/48 (!) 101/49   Pulse: (!) 49 (!) 51 (!) 50   Resp: 13 15 15    Temp:    97.8 F (36.6 C)  TempSrc:    Oral  SpO2: 100% 100% 98%   Weight:      Height:        Intake/Output Summary (Last 24 hours) at 05/03/2024 0918 Last data filed at 05/02/2024 1740 Gross per 24 hour  Intake 500 ml  Output --  Net 500 ml     Wt Readings from Last 3 Encounters:  05/02/24 71.7 kg  04/07/24 72 kg  01/13/24 71.5 kg     Exam General: Alert and oriented x 3, NAD Cardiovascular: S1 S2 auscultated,  RRR Respiratory: Clear to auscultation bilaterally, no wheezing Gastrointestinal: Soft, nontender, nondistended, + bowel sounds Ext: no pedal edema bilaterally Neuro: No new  deficits Psych: Normal affect     Data Reviewed:  I have personally reviewed following labs    CBC Lab Results  Component Value Date   WBC 7.8 05/03/2024   RBC 4.27 05/03/2024   HGB 12.7 (L) 05/03/2024   HCT 39.6 05/03/2024   MCV 92.7 05/03/2024   MCH 29.7 05/03/2024   PLT 150 05/03/2024   MCHC 32.1 05/03/2024   RDW 13.9 05/03/2024   LYMPHSABS 1.1 04/24/2023   MONOABS 0.5 04/24/2023   EOSABS 0.1 04/24/2023   BASOSABS 0.0 04/24/2023     Last metabolic panel Lab Results  Component Value Date   NA 137 05/03/2024   K 4.9 05/03/2024   CL 105 05/03/2024   CO2 24 05/03/2024   BUN 21 05/03/2024   CREATININE 1.41 (H) 05/03/2024   GLUCOSE 123 (H) 05/03/2024   GFRNONAA 49 (L) 05/03/2024   GFRAA 78 06/18/2017   CALCIUM  8.8 (L) 05/03/2024   PHOS 3.9 12/21/2022   PROT 7.4 05/02/2024   ALBUMIN  3.9 05/02/2024   LABGLOB 3.3 12/10/2023   AGRATIO 1.2 06/18/2017   BILITOT 0.6 05/02/2024   ALKPHOS 99 05/02/2024   AST 36 05/02/2024   ALT 35 05/02/2024   ANIONGAP 9 05/03/2024    CBG (last 3)  No results for input(s): GLUCAP in the last 72 hours.    Coagulation Profile: No results for input(s): INR, PROTIME in the last 168 hours.   Radiology Studies: I have personally reviewed the imaging studies  CT Angio Chest/Abd/Pel for Dissection W and/or Wo Contrast Result Date: 05/02/2024 EXAM: CTA CHEST, ABDOMEN AND PELVIS WITHOUT AND WITH CONTRAST 05/02/2024 05:09:32 PM TECHNIQUE: CTA of the chest was performed without and with the administration of intravenous contrast. CTA of the abdomen and pelvis was performed with the administration of intravenous contrast. Multiplanar reformatted images are provided for review. MIP images are provided for review. Automated exposure control, iterative reconstruction, and/or weight based adjustment of the mA/kV was utilized to reduce the radiation dose to as low as reasonably achievable. COMPARISON: CT abdomen and pelvis 01/26/2023. CLINICAL  HISTORY: Acute aortic syndrome (AAS) suspected; chest/upper abd pain, hypotension. Suspected acute aortic syndrome (AAS); chest pain and upper abdominal pain; hypotension. FINDINGS: VASCULATURE: AORTA: Negative for acute aortic syndrome. 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 No dissection. PULMONARY ARTERIES: Negative for pulmonary embolism. GREAT VESSELS OF AORTIC ARCH: Coronary artery and aortic atherosclerotic calcifications. No dissection. No arterial occlusion or significant stenosis. CELIAC TRUNK: No acute finding. No occlusion or significant stenosis. SUPERIOR MESENTERIC ARTERY: No acute finding. No occlusion or significant stenosis. INFERIOR MESENTERIC ARTERY: No acute finding. No occlusion or significant stenosis. RENAL ARTERIES: Moderate-to-severe narrowing of the left renal artery at the origin secondary to calcified atherosclerotic plaque. 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 CHEST: MEDIASTINUM: Sternotomy and CABG. Calcified subcarinal lymph node. The heart and pericardium demonstrate no acute abnormality. LUNGS AND PLEURA: Bilateral lower lobe bronchiectasis/bronchiolectasis with associated mild architectural distortion and atelectasis likely due to sequela of prior infection No focal consolidation or pulmonary edema. No evidence of pleural effusion or pneumothorax. THORACIC BONES AND SOFT TISSUES: Sternotomy. No acute bone or soft tissue abnormality. ABDOMEN AND PELVIS: LIVER: The liver is unremarkable. GALLBLADDER AND BILE DUCTS: Cholelithiasis. No evidence of acute cholecystitis. No biliary ductal dilatation. SPLEEN: The spleen is unremarkable. PANCREAS: The pancreas is unremarkable. ADRENAL GLANDS: Bilateral adrenal glands demonstrate no acute abnormality. KIDNEYS, URETERS AND BLADDER: No stones in the kidneys or ureters. No hydronephrosis. No  perinephric or periureteral stranding. Urinary bladder is unremarkable. GI AND BOWEL: Moderate stool in the sigmoid colon and rectum. Postoperative changes about the right colon. Stomach and duodenal sweep demonstrate no acute abnormality. There is no bowel obstruction. No abnormal bowel wall thickening or distension. REPRODUCTIVE: Reproductive organs are unremarkable. PERITONEUM AND RETROPERITONEUM: No ascites or free air. LYMPH NODES: No lymphadenopathy. ABDOMINAL BONES AND SOFT TISSUES: No acute abnormality of the bones. No acute soft tissue abnormality. IMPRESSION: 1. No evidence of acute aortic syndrome. 2. 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 3. 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 Electronically signed by: Norman Gatlin MD 05/02/2024 05:37 PM EST RP Workstation: [id number]   DG Chest 2 View Result Date: 05/02/2024 CLINICAL DATA:  Chest pain EXAM: CHEST - 2 VIEW COMPARISON:  April 24, 2023 FINDINGS: Stable cardiomediastinal silhouette. Status post coronary artery bypass graft. Elevated left hemidiaphragm with minimal left basilar subsegmental atelectasis. Right lung is clear. Bony thorax is unremarkable. IMPRESSION: Elevated left hemidiaphragm with minimal left basilar subsegmental atelectasis. Electronically Signed   By: Lynwood Landy Raddle M.D.   On: 05/02/2024 14:21       Jilliann Subramanian M.D. [facility] 05/03/2024, 9:18 AM  Available via Epic secure chat 7am-7pm After 7 pm, please refer to night coverage provider listed on amion.    "

## 2024-05-03 NOTE — H&P (View-Only) (Signed)
 "  Progress Note  Patient Name: Bradley Oconnor Date of Encounter: 05/03/2024 [facility] Cardiologist: Alm Clay, MD   Interval Summary    No further chest pain since last evening.   Vital Signs Vitals:   05/03/24 0700 05/03/24 0730 05/03/24 0800 05/03/24 0902  BP: 107/60 (!) 91/48 (!) 101/49   Pulse: (!) 49 (!) 51 (!) 50   Resp: 13 15 15    Temp:    97.8 F (36.6 C)  TempSrc:    Oral  SpO2: 100% 100% 98%   Weight:      Height:        Intake/Output Summary (Last 24 hours) at 05/03/2024 0936 Last data filed at 05/02/2024 1740 Gross per 24 hour  Intake 500 ml  Output --  Net 500 ml      05/02/2024    1:37 PM 04/07/2024   10:26 AM 01/13/2024    9:30 AM  Last 3 Weights  Weight (lbs) 158 lb 158 lb 12.8 oz 157 lb 9.6 oz  Weight (kg) 71.668 kg 72.031 kg 71.487 kg      Telemetry/ECG  Sinus bradycardia, 50s- Personally Reviewed  Physical Exam  GEN: No acute distress.  Resting comfortably in bed.  No acute distress. Neck: No JVD Cardiac: RRR, no murmurs, rubs, or gallops.  Respiratory: Clear to auscultation bilaterally. GI: Soft, nontender, non-distended  MS: No edema  Assessment & Plan   84 year old male with past medical history of CAD status post 4v CABG '93 (LIMA-LAD, SVG-diagonal, SVG-OM, SVG-PDA), DES to RCA '02, PE/DVT, paroxysmal atrial fibrillation, chronic systolic heart failure, mitral valve regurgitation, aortic valve regurgitation, hypertension, hyperlipidemia, anemia, GERD who presented with chest pain.   Unstable angina -- Presented with an episode of centralized chest pressure which lingered through the morning.  EMS was called and he was given sublingual nitroglycerin  with minimal improvement. Pain essentially subsided on its own. -- High-sensitivity troponin 62>>57 -- Given his symptoms, it is reasonable to proceed with cardiac catheterization given concerns for unstable angina.  After discussion, the patient himself also agrees that this is the  best option. -- Last dose of Eliquis  was 10:30 AM 2/3  Informed Consent   Shared Decision Making/Informed Consent The risks [stroke (1 in 1000), death (1 in 1000), kidney failure [usually temporary] (1 in 500), bleeding (1 in 200), allergic reaction [possibly serious] (1 in 200)], benefits (diagnostic support and management of coronary artery disease) and alternatives of a cardiac catheterization were discussed in detail with [name] and he is willing to proceed.    Chronic combined heart failure -- Echo 02/2024 LVEF of 20 to 30% => managed with torsemide  only.  Euvolemic on exam. -- GDMT: Has had issues with escalation of therapy in the setting of hypotension.   Paroxysmal atrial fibrillation -- Initially diagnosed during hospitalization 11/2022 in the setting of sepsis pneumonia -- Previously on amiodarone  however discontinued in the setting of QT prolongation -- As above Eliquis  currently held with plans for cardiac catheterization => will need to restart post cath.  If PCI attempted, would use Plavix .  CKD stage IIIb -- Creatinine around 1.4, was elevated to 1.58 on admission now improved -- torsemide  held  -- Received IV fluids, will hold additional IV fluids and give precath p.o. fluids as his GFR is greater than 45  History of PE/DVT -- Would plan to resume Eliquis  post procedure   Valvular heart disease -- Echo 02/2024 with trivial MR, mild to moderate moderate AI  Hyperlipidemia --  LDL 75 -- On Repatha , Crestor  and Zetia   For questions or updates, please contact [facility] Please consult [email or web address] for contact info under         Signed, Manuelita Rummer, NP     ATTENDING ATTESTATION  I have seen, examined and evaluated the patient this morning on rounds along with Manuelita Rummer, NP.  After reviewing all the available data and chart, we discussed the patients laboratory, study & physical findings as well as symptoms in detail.  I agree with her  findings, examination as well as impression recommendations as per our discussion.    Attending adjustments noted in italics.   Patient will see me, my primary cardiology patient followed by Dr. Burnard for many years dating back to the mid 1990s who now presents with unstable angina symptoms.  He has known CAD with an occluded RCA and occluded SVG to RPDA.  Occluded LCx with the SVG to OM, 80% proximal LAD in the past with SVG to diagonal branch and LIMA to LAD.  Has not had any symptoms at all from a cardiac angina standpoint until last night.  He has not had recent heart failure symptoms either.  Plans for cardiac catheterization today which is not unreasonable.  My concern is that he may have lost one of his grafts that is over 56 years old and may not be favorable for PCI.  I suspect his native vessels are probably significant enough that no native vessel PCI would be available either.  Further plans based on cath results    [name], MD, MS Alm Clay, M.D., M.S. Interventional Cardiologist  [facility] Pager # [phone]    "

## 2024-05-03 NOTE — Progress Notes (Signed)
 PHARMACY - ANTICOAGULATION CONSULT NOTE  Pharmacy Consult for heparin  Indication: chest pain/ACS and Afib   Allergies[1]  Patient Measurements: Height: 5' 7 (170.2 cm) Weight: 70 kg (154 lb 5.2 oz) IBW/kg (Calculated) : 66.1 HEPARIN  DW (KG): 70  Vital Signs: Temp: 97.6 F (36.4 C) (02/04 1447) Temp Source: Oral (02/04 1447) BP: 101/54 (02/04 1714) Pulse Rate: 68 (02/04 1714)  Labs: Recent Labs    05/02/24 1320 05/03/24 0245  HGB 14.0 12.7*  HCT 43.8 39.6  PLT 190 150  CREATININE 1.58* 1.41*    Estimated Creatinine Clearance: 36.5 mL/min (A) (by C-G formula based on SCr of 1.41 mg/dL (H)).   Medical History: Past Medical History:  Diagnosis Date   Blood transfusion without reported diagnosis    CAD (coronary artery disease)    GERD (gastroesophageal reflux disease)    Heart attack (HCC)    Hyperlipidemia    Hypertension 03/07/2012   ECHO-WNL     08/12/11 Lexiscan MyoviewNo significant ischemia demonstrated Low risk scan There is a moderate sized dense scar in the LCX territoy unchanged from the prior study.. Post- stress EF is 40%.   Septic shock (HCC) 12/10/2022   Thyroid  disease      Assessment: 84 yo M s/p cath found to have Severe multivessel CAD planning medical management. Patient is on apixaban  PTA for afib and plan to give 24-48hr heparin  for ACS before resumption of apixaban . Last dose apixaban  2/3 8am. Received 4000 units heparin  in cath.   TR band anticipated to come off at 19:30   Goal of Therapy:  Heparin  level 0.3-0.7 units/ml Monitor platelets by anticoagulation protocol: Yes   Plan:  Heparin  1000 units/hr at 21:30 Monitor daily heparin  level, CBC, signs/symptoms of bleeding  F/u restart apixaban    Jinnie Door, PharmD, BCPS, BCCP Clinical Pharmacist  Please check AMION for all [facility] Pharmacy phone numbers After 10:00 PM, call Main Pharmacy [phone]     [1]  Allergies Allergen Reactions   Procardia [Nifedipine] Other (See Comments)     Hypotension    Phenergan [Promethazine Hcl] Nausea And Vomiting

## 2024-05-04 ENCOUNTER — Encounter (HOSPITAL_COMMUNITY): Payer: Self-pay | Admitting: Cardiology

## 2024-05-04 LAB — BASIC METABOLIC PANEL WITH GFR
Anion gap: 9 (ref 5–15)
BUN: 19 mg/dL (ref 8–23)
CO2: 23 mmol/L (ref 22–32)
Calcium: 8.9 mg/dL (ref 8.9–10.3)
Chloride: 106 mmol/L (ref 98–111)
Creatinine, Ser: 1.36 mg/dL — ABNORMAL HIGH (ref 0.61–1.24)
GFR, Estimated: 51 mL/min — ABNORMAL LOW
Glucose, Bld: 96 mg/dL (ref 70–99)
Potassium: 4.4 mmol/L (ref 3.5–5.1)
Sodium: 138 mmol/L (ref 135–145)

## 2024-05-04 LAB — HEPARIN LEVEL (UNFRACTIONATED): Heparin Unfractionated: >1.1 [IU]/mL — ABNORMAL HIGH (ref 0.30–0.70)

## 2024-05-04 LAB — CBC
HCT: 38.3 % — ABNORMAL LOW (ref 39.0–52.0)
Hemoglobin: 12.4 g/dL — ABNORMAL LOW (ref 13.0–17.0)
MCH: 29.7 pg (ref 26.0–34.0)
MCHC: 32.4 g/dL (ref 30.0–36.0)
MCV: 91.8 fL (ref 80.0–100.0)
Platelets: 146 10*3/uL — ABNORMAL LOW (ref 150–400)
RBC: 4.17 MIL/uL — ABNORMAL LOW (ref 4.22–5.81)
RDW: 14.1 % (ref 11.5–15.5)
WBC: 7.1 10*3/uL (ref 4.0–10.5)
nRBC: 0 % (ref 0.0–0.2)

## 2024-05-04 LAB — APTT
aPTT: >200 s (ref 24–36)
aPTT: >200 s (ref 24–36)
aPTT: >200 s (ref 24–36)

## 2024-05-04 MED ORDER — APIXABAN 5 MG PO TABS
5.0000 mg | ORAL_TABLET | Freq: Two times a day (BID) | ORAL | Status: DC
  Administered 2024-05-04 – 2024-05-05 (×2): 5 mg via ORAL
  Filled 2024-05-04 (×2): qty 1

## 2024-05-04 NOTE — Progress Notes (Addendum)
 CARDIAC REHAB PHASE I   Post NSTEMI education including restrictions, risk factors, exercise guidelines, antiplatelet therapy importance, MI booklet, NTG use, heart healthy diet, and CRP2 reviewed. All questions and concerns addressed. Will refer to  [facility] for CRP2. Patient states he has been feeling very weak. Patient has PT consult pending. Will follow for ambulatory needs.     2:00-2:34 Bradley JAYSON Liverpool, RN BSN 05/04/2024 2:34 PM

## 2024-05-04 NOTE — Progress Notes (Signed)
 PHARMACY - ANTICOAGULATION CONSULT NOTE  Pharmacy Consult for heparin  > Eliquis  Indication: chest pain/ACS and Afib, hx PE/DVT  Allergies[1]  Patient Measurements: Height: 5' 7 (170.2 cm) Weight: 70 kg (154 lb 5.2 oz) IBW/kg (Calculated) : 66.1 HEPARIN  DW (KG): 70  Vital Signs: Temp: 98 F (36.7 C) (02/05 0717) Temp Source: Oral (02/05 0717) BP: 112/51 (02/05 0717) Pulse Rate: 68 (02/05 0717)  Labs: Recent Labs    05/02/24 1320 05/03/24 0245 05/04/24 0201  HGB 14.0 12.7* 12.4*  HCT 43.8 39.6 38.3*  PLT 190 150 146*  HEPARINUNFRC  --   --  >1.10*  CREATININE 1.58* 1.41*  --     Estimated Creatinine Clearance: 36.5 mL/min (A) (by C-G formula based on SCr of 1.41 mg/dL (H)).   Medical History: Past Medical History:  Diagnosis Date   Blood transfusion without reported diagnosis    CAD (coronary artery disease)    GERD (gastroesophageal reflux disease)    Heart attack (HCC)    Hyperlipidemia    Hypertension 03/07/2012   ECHO-WNL     08/12/11 Lexiscan MyoviewNo significant ischemia demonstrated Low risk scan There is a moderate sized dense scar in the LCX territoy unchanged from the prior study.. Post- stress EF is 40%.   Septic shock (HCC) 12/10/2022   Thyroid  disease      Assessment: 84 yo M s/p cath found to have Severe multivessel CAD planning medical management. Patient is on apixaban  PTA for afib and plan to give 24-48hr heparin  for ACS before resumption of apixaban . Last dose apixaban  2/3 8am. Received 4000 units heparin  in cath.   Initial HL > 1.1 as expected with recent DOAC.  aPTT >200 sec (same on repeat) on 1000 units/hr (~14 units/kg/hr). Hgb 12.4 stable post LHC, pltc 146 stable.  Discussed with cardiology, originally planning 24h medical management with switch to PTA Eliquis  this evening - now will hold heparin  gtt and allow for clearance with plan to restart PTA Eliquis  this evening for hx DVT/PE, Afib.  Goal of Therapy:  Heparin  level 0.3-0.7  units/ml Monitor platelets by anticoagulation protocol: Yes   Plan:  STOP heparin  Restart PTA Eliquis  2.5 mg BID 2/5 @2200   Maurilio Fila, PharmD Clinical Pharmacist 05/04/2024  10:35 AM     [1]  Allergies Allergen Reactions   Procardia [Nifedipine] Other (See Comments)    Hypotension    Phenergan [Promethazine Hcl] Nausea And Vomiting

## 2024-05-04 NOTE — Care Management Obs Status (Signed)
 MEDICARE OBSERVATION STATUS NOTIFICATION   Patient Details  Name: Bradley Oconnor MRN: [id number] Date of Birth: 1941/03/30   Medicare Observation Status Notification Given:  Yes    Vonzell Arrie Sharps 05/04/2024, 8:59 AM

## 2024-05-04 NOTE — Progress Notes (Signed)
 Date and time results received: 05/04/24 1200 (use smartphrase .now to insert current time)  Test: PTT  Critical Value: greater than 200  Name of Provider Notified: Dr. Davia   Orders Received? Or Actions Taken?: awaiting orders

## 2024-05-04 NOTE — Progress Notes (Addendum)
 "  Progress Note  Patient Name: Bradley Oconnor Date of Encounter: 05/04/2024 [facility] Cardiologist: Alm Clay, MD   Interval Summary    Feels well this morning, no chest pain overnight.  He just feels a little bit weak and tired today.  No more chest pain.  Vital Signs Vitals:   05/03/24 2300 05/03/24 2330 05/04/24 0348 05/04/24 0717  BP: (!) 96/48 (!) 91/49 (!) 93/57 (!) 112/51  Pulse: (!) 59 64 (!) 58 68  Resp: 17 18 18 16   Temp:  98.3 F (36.8 C) 98.1 F (36.7 C) 98 F (36.7 C)  TempSrc:  Oral Oral Oral  SpO2: 96% 95% 96% 96%  Weight:      Height:        Intake/Output Summary (Last 24 hours) at 05/04/2024 1029 Last data filed at 05/04/2024 0900 Gross per 24 hour  Intake 1333.31 ml  Output 350 ml  Net 983.31 ml      05/03/2024    3:26 PM 05/02/2024    1:37 PM 04/07/2024   10:26 AM  Last 3 Weights  Weight (lbs) 154 lb 5.2 oz 158 lb 158 lb 12.8 oz  Weight (kg) 70 kg 71.668 kg 72.031 kg      Telemetry/ECG   Sinus Rhythm, intermittent episodes of bradycardia - Personally Reviewed  Physical Exam  GEN: No acute distress.   Neck: No JVD Cardiac: RRR, no murmurs, rubs, or gallops.  Respiratory: Clear to auscultation bilaterally. GI: Soft, nontender, non-distended  MS: No edema VAS: left radial cath site stable   Assessment & Plan   84 year old male with past medical history of CAD status post 4v CABG '93 (LIMA-LAD, SVG-diagonal, SVG-OM, SVG-PDA), DES to RCA '02, PE/DVT, paroxysmal atrial fibrillation, chronic systolic heart failure, mitral valve regurgitation, aortic valve regurgitation, hypertension, hyperlipidemia, anemia, GERD who presented with chest pain.    Unstable angina -- Presented with an episode of centralized chest pressure which lingered through the morning.  EMS was called and he was given sublingual nitroglycerin  with minimal improvement. Pain essentially subsided on its own. -- High-sensitivity troponin 62>>57 -- Given his symptoms,  underwent cardiac cath 2/4 with patent LIMA-LAD, occluded SVG-RCA and sequential SVG-diag (likely culprit lesion). Recommendations for medical management, heparin  for total of 24hrs then resume Eliquis .  Considered addition of Imdur  but hx of soft/low BPs suspect he may not tolerate. Defer ranexa  with renal disease  -- continue plavix , no ASA with need for Eliquis  -- needs to ambulate to today, CR to see => depending on how he feels as far strength or dyspnea, would potentially be ready for discharge today however if he still feels somewhat weak and needs more work would probably plan for discharge tomorrow.   Chronic combined heart failure -- Echo 02/2024 LVEF of 20 to 30% => managed with torsemide  only.  Euvolemic on exam. -- GDMT: Has had issues with escalation (or even initiation) of therapy in the setting of hypotension.    Paroxysmal atrial fibrillation -- Initially diagnosed during hospitalization 11/2022 in the setting of sepsis pneumonia -- Previously on amiodarone  however discontinued in the setting of QT prolongation -- As above Eliquis  currently held, plan to resume once off heparin   == Blood pressure would not tolerate beta-blockers for rate control   CKD stage IIIb -- Creatinine around 1.4, was elevated to 1.58. down to 1.41 yesterday. BMET pending -- torsemide  held, plan to resume at discharge    History of PE/DVT -- as above, resume once heparin  complete  Valvular heart disease -- Echo 02/2024 with trivial MR, mild to moderate moderate AI   Hyperlipidemia -- LDL 75 -- On Repatha , Crestor  and Zetia   Ambulate today and see how he does, likely DC tomorrow  For questions or updates, please contact [facility] Please consult [email or web address] for contact info under         Signed, Manuelita Rummer, NP    ATTENDING ATTESTATION  I have seen, examined and evaluated the patient this AM on rounds along with Manuelita Rummer, NP-C.  After reviewing all the  available data and chart, we discussed the patients laboratory, study & physical findings as well as symptoms in detail.  I agree with her findings, examination as well as impression recommendations as per our discussion.    Attending adjustments noted in italics.   Films reviewed.  Appears that his initial LIMA to sequential graft to diagonal-OM no longer has flow down the diagonal with the OM being open (I do not think the picture is correct).  Regardless, the smaller of the 2 grafted target vessels is now likely occluded with no interventional options.  In fact we cannot even see where the grafted vessel was.  As such, we are really limited only to medical management, and as noted, he has been unable to tolerate medications in the past even Imdur .  At this point I think he essentially shut down the diagonal branch which led to a minor troponin elevation, and a small amount of damage is done but should not make it an appreciable difference in his quality of life.   If he is able to do well walking today, could potentially discharge this afternoon, but otherwise would plan for discharge tomorrow morning. If he does remain overnight we will see him in the morning for he goes home to ensure his follow-up is set up or otherwise I will see him in clinic.    [name], MD, MS Alm Clay, M.D., M.S. Interventional Cardiologist  [facility] Pager # [phone]     "

## 2024-05-04 NOTE — Progress Notes (Signed)
 "          [facility]                                                                              Bradley Oconnor, is a 84 y.o. male, DOB - [birth year], FMW:[id number] Admit date - 05/02/2024    Outpatient Primary MD for the patient is Levora Reyes SAUNDERS, MD  LOS - 0  days  Chief Complaint  Patient presents with   Chest Pain   Hypotension       Brief summary   Patient is a 84 year old male with CAD s/p CABG and DES, chronic systolic CHF, CKD stage III, paroxysmal A-fib presented to ED with chest and epigastric pain at home.  Patient reported that he woke up in the morning and was doing fine but later he started developing some epigastric discomfort that resolved without any intervention.  Later when he ate an apple, his epigastric and chest pain came back with nausea and vomiting. He was concerned because he had similar symptoms when he previously had ACS.   ED Course: In the ER labs show troponin of 62.57 hemoglobin 14 creatinine 1.5.  CTA chest abdomen pelvis with no acute abnormality.   Cardiology consulted   Assessment & Plan     Chest pain/unstable angina (HCC) In the setting of known CAD (coronary artery disease), CABG, DES - Presented with chest pain, troponin 62 > 57  - Underwent cardiac cath 2/4 with patent LIMA-LAD, occluded SVG-RCA and sequential SVG-diag (likely culprit lesion).  - Per cardiology, continue heparin  for 24 hours, then resume Eliquis . - Continue Plavix , no aspirin  with need for eliquis . - Ambulate today    Chronic combined systolic and diastolic CHF (congestive heart failure) (HCC) - 2D echo 02/2024 showed EF of 30 to 35% with G1 DD -  GDMT limited due to hypotension. - On torsemide  outpatient, currently euvolemic.  Cardiology following  Hyperlipidemia - Continue statin, Zetia , Repatha   Paroxysmal atrial fibrillation - Rate controlled, bradycardia - Eliquis  on hold for cardiac cath   History of PE/DVT - Eliquis  on hold.  Currently on  heparin  drip.    Moderate aortic valve regurgitation - 2D echo 12//25 that showed mild to moderate AR, cardiology following  CKD stage IIIb - Creatinine 1.58 on admission, baseline 1.4 - Torsemide  held, creatinine improving, 1.3   Estimated body mass index is 24.17 kg/m as calculated from the following:   Height as of this encounter: 5' 7 (1.702 m).   Weight as of this encounter: 70 kg.  Code Status: Full CODE STATUS DVT Prophylaxis:  SCD's Start: 05/03/24 1728   Level of Care: Level of care: Telemetry Family Communication: Updated patient Disposition Plan:      Remains inpatient appropriate: Hopefully DC home tomorrow, has caregiver at home   Procedures:    Consultants:   Cardiology  Antimicrobials:   Anti-infectives (From admission, onward)    None          Medications  cyanocobalamin   1,000 mcg Oral Daily   docusate sodium   50 mg Oral Daily   ezetimibe   10 mg Oral Daily   folic acid   1 mg Oral Daily   magnesium  chloride  1 tablet Oral Daily   memantine   5 mg Oral BID   mirtazapine   15 mg Oral QHS   oxybutynin   5 mg Oral BID   pantoprazole   40 mg Oral Daily   rosuvastatin   20 mg Oral Daily   sodium chloride  flush  3 mL Intravenous Q12H      Subjective:   Bradley Oconnor was seen and examined today.  No acute complaints.  No chest pain, shortness of breath, dizziness, nausea or vomiting.  Objective:   Vitals:   05/03/24 2330 05/04/24 0348 05/04/24 0717 05/04/24 1146  BP: (!) 91/49 (!) 93/57 (!) 112/51 (!) 95/58  Pulse: 64 (!) 58 68 66  Resp: 18 18 16 20   Temp: 98.3 F (36.8 C) 98.1 F (36.7 C) 98 F (36.7 C) 98 F (36.7 C)  TempSrc: Oral Oral Oral Oral  SpO2: 95% 96% 96% 95%  Weight:      Height:        Intake/Output Summary (Last 24 hours) at 05/04/2024 1151 Last data filed at 05/04/2024 1147 Gross per 24 hour  Intake 833.31 ml  Output 575 ml  Net 258.31 ml     Wt Readings from Last 3 Encounters:  05/03/24 70 kg  04/07/24 72 kg   01/13/24 71.5 kg   Physical Exam General: Alert and oriented x 3, NAD Cardiovascular: S1 S2 clear, RRR.  Respiratory: CTAB, no wheezing Gastrointestinal: Soft, nontender, nondistended, NBS Ext: no pedal edema bilaterally Neuro: no new deficits Psych: Normal affect     Data Reviewed:  I have personally reviewed following labs    CBC Lab Results  Component Value Date   WBC 7.1 05/04/2024   RBC 4.17 (L) 05/04/2024   HGB 12.4 (L) 05/04/2024   HCT 38.3 (L) 05/04/2024   MCV 91.8 05/04/2024   MCH 29.7 05/04/2024   PLT 146 (L) 05/04/2024   MCHC 32.4 05/04/2024   RDW 14.1 05/04/2024   LYMPHSABS 1.1 04/24/2023   MONOABS 0.5 04/24/2023   EOSABS 0.1 04/24/2023   BASOSABS 0.0 04/24/2023     Last metabolic panel Lab Results  Component Value Date   NA 138 05/04/2024   K 4.4 05/04/2024   CL 106 05/04/2024   CO2 23 05/04/2024   BUN 19 05/04/2024   CREATININE 1.36 (H) 05/04/2024   GLUCOSE 96 05/04/2024   GFRNONAA 51 (L) 05/04/2024   GFRAA 78 06/18/2017   CALCIUM  8.9 05/04/2024   PHOS 3.9 12/21/2022   PROT 7.4 05/02/2024   ALBUMIN  3.9 05/02/2024   LABGLOB 3.3 12/10/2023   AGRATIO 1.2 06/18/2017   BILITOT 0.6 05/02/2024   ALKPHOS 99 05/02/2024   AST 36 05/02/2024   ALT 35 05/02/2024   ANIONGAP 9 05/04/2024    CBG (last 3)  No results for input(s): GLUCAP in the last 72 hours.    Coagulation Profile: No results for input(s): INR, PROTIME in the last 168 hours.   Radiology Studies: I have personally reviewed the imaging studies  CARDIAC CATHETERIZATION Addendum Date: 05/03/2024 Coronary and bypass graft angiography 05/03/2024: LM: No significant disease LAD: Mid 60% stenosis followed by aneurysmal segment, followed by 100% occlusion Lcx: Prox occlusion RCA: Mid occlusion LIMA-LAD: Patent SVG-RCA: Ostially occluded SVG-OM/diag: SVG-OM section patent, diag section occluded (possibly culprit) LVEDP 15 mmHg Conclusion: Severe multivessel CAD Patent 2/4 grafts  (occluded SVG-RCA and sequential section of SVG-diag) Recommendation: Medical management for NSTEMI Given ongoing use of Eliquis  for A-fib, recommend Plavix  and Eliquis  without aspirin  Consider 24-48 hours of heparin  for ACS  management before resumption of Eliquis  Newman JINNY Lawrence, MD   Result Date: 05/03/2024 Images from the original result were not included. Coronary and bypass graft angiography 05/03/2024: LM: No significant disease LAD: Mid 60% stenosis followed by aneurysmal segment, followed by 100% occlusion Lcx: Prox occlusion RCA: Mid occlusion LIMA-LAD: Patent SVG-RCA: Ostially occluded SVG-OM/diag: SVG-OM section patent, diag section occluded (possibly culprit) LVEDP 15 mmHg Conclusion: Severe multivessel CAD Patent 3/4 grafts (occluded sequential section of SVG-diag) Recommendation: Medical management for NSTEMI Given ongoing use of Eliquis  for A-fib, recommend Plavix  and Eliquis  without aspirin  Consider 24-48 hours of heparin  for ACS management before resumption of Eliquis  Newman JINNY Lawrence, MD  CT Angio Chest/Abd/Pel for Dissection W and/or Wo Contrast Result Date: 05/02/2024 EXAM: CTA CHEST, ABDOMEN AND PELVIS WITHOUT AND WITH CONTRAST 05/02/2024 05:09:32 PM TECHNIQUE: CTA of the chest was performed without and with the administration of intravenous contrast. CTA of the abdomen and pelvis was performed with the administration of intravenous contrast. Multiplanar reformatted images are provided for review. MIP images are provided for review. Automated exposure control, iterative reconstruction, and/or weight based adjustment of the mA/kV was utilized to reduce the radiation dose to as low as reasonably achievable. COMPARISON: CT abdomen and pelvis 01/26/2023. CLINICAL HISTORY: Acute aortic syndrome (AAS) suspected; chest/upper abd pain, hypotension. Suspected acute aortic syndrome (AAS); chest pain and upper abdominal pain; hypotension. FINDINGS: VASCULATURE: AORTA: Negative for acute aortic  syndrome. 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 No dissection. PULMONARY ARTERIES: Negative for pulmonary embolism. GREAT VESSELS OF AORTIC ARCH: Coronary artery and aortic atherosclerotic calcifications. No dissection. No arterial occlusion or significant stenosis. CELIAC TRUNK: No acute finding. No occlusion or significant stenosis. SUPERIOR MESENTERIC ARTERY: No acute finding. No occlusion or significant stenosis. INFERIOR MESENTERIC ARTERY: No acute finding. No occlusion or significant stenosis. RENAL ARTERIES: Moderate-to-severe narrowing of the left renal artery at the origin secondary to calcified atherosclerotic plaque. 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 CHEST: MEDIASTINUM: Sternotomy and CABG. Calcified subcarinal lymph node. The heart and pericardium demonstrate no acute abnormality. LUNGS AND PLEURA: Bilateral lower lobe bronchiectasis/bronchiolectasis with associated mild architectural distortion and atelectasis likely due to sequela of prior infection No focal consolidation or pulmonary edema. No evidence of pleural effusion or pneumothorax. THORACIC BONES AND SOFT TISSUES: Sternotomy. No acute bone or soft tissue abnormality. ABDOMEN AND PELVIS: LIVER: The liver is unremarkable. GALLBLADDER AND BILE DUCTS: Cholelithiasis. No evidence of acute cholecystitis. No biliary ductal dilatation. SPLEEN: The spleen is unremarkable. PANCREAS: The pancreas is unremarkable. ADRENAL GLANDS: Bilateral adrenal glands demonstrate no acute abnormality. KIDNEYS, URETERS AND BLADDER: No stones in the kidneys or ureters. No hydronephrosis. No perinephric or periureteral stranding. Urinary bladder is unremarkable. GI AND BOWEL: Moderate stool in the sigmoid colon and rectum. Postoperative changes about the right colon. Stomach and duodenal sweep demonstrate no acute  abnormality. There is no bowel obstruction. No abnormal bowel wall thickening or distension. REPRODUCTIVE: Reproductive organs are unremarkable. PERITONEUM AND RETROPERITONEUM: No ascites or free air. LYMPH NODES: No lymphadenopathy. ABDOMINAL BONES AND SOFT TISSUES: No acute abnormality of the bones. No acute soft tissue abnormality. IMPRESSION: 1. No evidence of acute aortic syndrome. 2. 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 3. 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 Electronically signed by: Norman Gatlin MD 05/02/2024 05:37  PM EST RP Workstation: [id number]   DG Chest 2 View Result Date: 05/02/2024 CLINICAL DATA:  Chest pain EXAM: CHEST - 2 VIEW COMPARISON:  April 24, 2023 FINDINGS: Stable cardiomediastinal silhouette. Status post coronary artery bypass graft. Elevated left hemidiaphragm with minimal left basilar subsegmental atelectasis. Right lung is clear. Bony thorax is unremarkable. IMPRESSION: Elevated left hemidiaphragm with minimal left basilar subsegmental atelectasis. Electronically Signed   By: Lynwood Landy Raddle M.D.   On: 05/02/2024 14:21       Josimar Corning M.D. [facility] 05/04/2024, 11:51 AM  Available via Epic secure chat 7am-7pm After 7 pm, please refer to night coverage provider listed on amion.    "

## 2024-05-05 ENCOUNTER — Other Ambulatory Visit (HOSPITAL_COMMUNITY): Payer: Self-pay

## 2024-05-05 ENCOUNTER — Telehealth (HOSPITAL_COMMUNITY): Payer: Self-pay

## 2024-05-05 LAB — BASIC METABOLIC PANEL WITH GFR
Anion gap: 8 (ref 5–15)
BUN: 20 mg/dL (ref 8–23)
CO2: 24 mmol/L (ref 22–32)
Calcium: 8.7 mg/dL — ABNORMAL LOW (ref 8.9–10.3)
Chloride: 107 mmol/L (ref 98–111)
Creatinine, Ser: 1.38 mg/dL — ABNORMAL HIGH (ref 0.61–1.24)
GFR, Estimated: 50 mL/min — ABNORMAL LOW
Glucose, Bld: 110 mg/dL — ABNORMAL HIGH (ref 70–99)
Potassium: 3.8 mmol/L (ref 3.5–5.1)
Sodium: 139 mmol/L (ref 135–145)

## 2024-05-05 LAB — CBC
HCT: 35.9 % — ABNORMAL LOW (ref 39.0–52.0)
Hemoglobin: 11.7 g/dL — ABNORMAL LOW (ref 13.0–17.0)
MCH: 29.7 pg (ref 26.0–34.0)
MCHC: 32.6 g/dL (ref 30.0–36.0)
MCV: 91.1 fL (ref 80.0–100.0)
Platelets: 133 10*3/uL — ABNORMAL LOW (ref 150–400)
RBC: 3.94 MIL/uL — ABNORMAL LOW (ref 4.22–5.81)
RDW: 14.1 % (ref 11.5–15.5)
WBC: 5.7 10*3/uL (ref 4.0–10.5)
nRBC: 0 % (ref 0.0–0.2)

## 2024-05-05 MED ORDER — CLOPIDOGREL BISULFATE 75 MG PO TABS
75.0000 mg | ORAL_TABLET | Freq: Every day | ORAL | 3 refills | Status: AC
  Filled 2024-05-05: qty 30, 30d supply, fill #0

## 2024-05-05 MED ORDER — CLOPIDOGREL BISULFATE 75 MG PO TABS
75.0000 mg | ORAL_TABLET | Freq: Every day | ORAL | Status: DC
  Administered 2024-05-05: 75 mg via ORAL
  Filled 2024-05-05: qty 1

## 2024-05-05 NOTE — Progress Notes (Addendum)
 CARDIAC REHAB PHASE I   PRE:  Rate/Rhythm: 57 SB    BP: sitting 101/52    SpO2: 97 RA  MODE:  Ambulation: 430 ft   POST:  Rate/Rhythm: 80 SR    BP: sitting 124/67     SpO2: 100 RA   Pt tolerated well with rollator (all that was available) and standby assist. No major c/o, denied CP. His caregiver was present and very supportive/attentive. Reviewed NTG use with good reception. He sts he is not very interested in CRPII however will walk with caregiver at home.  [id number]  Aliene Aris BS, ACSM-CEP 05/05/2024 9:46 AM

## 2024-05-05 NOTE — Discharge Summary (Signed)
 " Physician Discharge Summary   Patient: Bradley Oconnor MRN: [id number] DOB: 11-04-1940  Admit date:     05/02/2024  Discharge date: 05/05/24  Discharge Physician: Nydia Distance, MD    PCP: Levora Reyes SAUNDERS, MD   Recommendations at discharge:   No new medication changes, outpatient follow-up with cardiology  Discharge Diagnoses:    Unstable angina [facility])   CAD (coronary artery disease) of artery bypass graft   GERD (gastroesophageal reflux disease)   Persistent atrial fibrillation (HCC)   Chronic combined systolic and diastolic CHF (congestive heart failure) (HCC)   Essential hypertension   Hyperlipidemia   CKD (chronic kidney disease) stage 4, GFR 15-29 ml/min [facility])   Hospital Course: Patient is a 84 year old male with CAD s/p CABG and DES, chronic systolic CHF, CKD stage III, paroxysmal A-fib presented to ED with chest and epigastric pain at home.  Patient reported that he woke up in the morning and was doing fine but later he started developing some epigastric discomfort that resolved without any intervention.  Later when he ate an apple, his epigastric and chest pain came back with nausea and vomiting. He was concerned because he had similar symptoms when he previously had ACS.   In the ER labs show troponin of 62.57 hemoglobin 14 creatinine 1.5.  CTA chest abdomen pelvis with no acute abnormality.  Cardiology was consulted    Assessment and Plan:  Chest pain/unstable angina (HCC) In the setting of known CAD (coronary artery disease), CABG, DES - Presented with chest pain, troponin 62 > 57  - Underwent cardiac cath 2/4 with patent LIMA-LAD, occluded SVG-RCA and sequential SVG-diag (likely culprit lesion).  -Per cardiology, considered addition of Imdur  however with soft/low BP may not tolerate.  Continue Plavix , no aspirin  with need for eliquis      Chronic combined systolic and diastolic CHF (congestive heart failure) (HCC) - 2D echo 02/2024 showed EF of 30 to 35% with G1  DD -  GDMT limited due to hypotension. - Continue torsemide , issues with escalation or initiation of GDMT in the setting of hypotension and renal disease.   Hyperlipidemia - Continue statin, Zetia , Repatha    Paroxysmal atrial fibrillation - Rate controlled, bradycardia - Eliquis  resumed eliquis      History of PE/DVT - Eliquis  resumed   Moderate aortic valve regurgitation - 2D echo 12/25 that showed mild to moderate AR   CKD stage IIIb - Creatinine 1.58 on admission, baseline 1.4 - Creatinine stable 1.3 at discharge     Estimated body mass index is 24.17 kg/m as calculated from the following:   Height as of this encounter: 5' 7 (1.702 m).   Weight as of this encounter: 70 kg.        Pain control - [place]  Controlled Substance Reporting System database was reviewed. and patient was instructed, not to drive, operate heavy machinery, perform activities at heights, swimming or participation in water  activities or provide baby-sitting services while on Pain, Sleep and Anxiety Medications; until their outpatient Physician has advised to do so again. Also recommended to not to take more than prescribed Pain, Sleep and Anxiety Medications.  Consultants: Cardiology Procedures performed: Cardiac catheterization Disposition: Home Diet recommendation:  Discharge Diet Orders (From admission, onward)     Start     Ordered   05/05/24 0000  Diet - low sodium heart healthy        05/05/24 1105            DISCHARGE MEDICATION: Allergies as of 05/05/2024  Reactions   Procardia [nifedipine] Other (See Comments)   Hypotension   Phenergan [promethazine Hcl] Nausea And Vomiting        Medication List     TAKE these medications    acetaminophen  325 MG tablet Commonly known as: TYLENOL  Take 650 mg by mouth every 6 (six) hours as needed.   clopidogrel  75 MG tablet Commonly known as: PLAVIX  Take 1 tablet (75 mg total) by mouth daily.   cyanocobalamin  1000 MCG  tablet Commonly known as: VITAMIN B12 Take 1 tablet (1,000 mcg total) by mouth daily.   docusate sodium  50 MG capsule Commonly known as: COLACE Take 50 mg by mouth daily.   Eliquis  5 MG Tabs tablet Generic drug: apixaban  Take 1 tablet by mouth twice daily   ezetimibe  10 MG tablet Commonly known as: ZETIA  Take 1 tablet (10 mg total) by mouth daily.   folic acid  1 MG tablet Commonly known as: FOLVITE  Take 1 tablet (1 mg total) by mouth daily.   magnesium  chloride 64 MG Tbec SR tablet Commonly known as: SLOW-MAG Take 1 tablet (64 mg total) by mouth daily.   memantine  5 MG tablet Commonly known as: NAMENDA  Take 1 tablet (5 mg at night) for 2 weeks, then increase to 1 tablet (5 mg) twice a day   mirtazapine  15 MG disintegrating tablet Commonly known as: REMERON  SOL-TAB DISSOLVE 1 TABLET BY MOUTH AT BEDTIME What changed: See the new instructions.   nitroGLYCERIN  0.4 MG SL tablet Commonly known as: NITROSTAT  Place 1 tablet (0.4 mg total) under the tongue every 5 (five) minutes as needed for chest pain. If med is needed, be seen by medical provider immediately.   oxybutynin  5 MG tablet Commonly known as: DITROPAN  Take 1 tablet (5 mg total) by mouth 2 (two) times daily.   pantoprazole  40 MG tablet Commonly known as: PROTONIX  Take 1 tablet by mouth once daily   potassium chloride  SA 20 MEQ tablet Commonly known as: KLOR-CON  M Take 1 tablet by mouth once daily   Repatha  SureClick 140 MG/ML Soaj Generic drug: Evolocumab  Inject 140 mg into the skin every 14 (fourteen) days.   rosuvastatin  20 MG tablet Commonly known as: CRESTOR  Take 1 tablet (20 mg total) by mouth daily.   torsemide  20 MG tablet Commonly known as: DEMADEX  TAKE 1/2 TO 1 (ONE-HALF TO ONE) TABLET BY MOUTH ONCE DAILY        Follow-up Information     Levora Reyes SAUNDERS, MD Follow up on 05/15/2024.   Specialties: Family Medicine, Sports Medicine Why: 11:20 for hospital follow up Contact  information: [address] [place] [address] [phone]         Anner Alm ORN, MD. Schedule an appointment as soon as possible for a visit in 2 week(s).   Specialty: Cardiology Why: for hospital follow-up Contact information: [address] [place] [address] [phone]                Discharge Exam: Fredricka Weights   05/02/24 1337 05/03/24 1526  Weight: 71.7 kg 70 kg   S: No acute complaints, brother and caregiver at the bedside.  Feels ready to go home today.  Cleared by cardiology.  BP (!) 97/59 (BP Location: Left Arm)   Pulse (!) 58   Temp 97.9 F (36.6 C) (Oral)   Resp (!) 23   Ht 5' 7 (1.702 m)   Wt 70 kg   SpO2 98%   BMI 24.17 kg/m   Physical Exam  General: Alert and oriented x 3, NAD Cardiovascular: S1 S2 clear, RRR.  Respiratory: CTAB, no wheezing Gastrointestinal: Soft, nontender, nondistended, NBS Ext: no pedal edema bilaterally Neuro: no new deficits Psych: Normal affect    Condition at discharge: fair  The results of significant diagnostics from this hospitalization (including imaging, microbiology, ancillary and laboratory) are listed below for reference.   Imaging Studies: CARDIAC CATHETERIZATION Addendum Date: 05/03/2024 Coronary and bypass graft angiography 05/03/2024: LM: No significant disease LAD: Mid 60% stenosis followed by aneurysmal segment, followed by 100% occlusion Lcx: Prox occlusion RCA: Mid occlusion LIMA-LAD: Patent SVG-RCA: Ostially occluded SVG-OM/diag: SVG-OM section patent, diag section occluded (possibly culprit) LVEDP 15 mmHg Conclusion: Severe multivessel CAD Patent 2/4 grafts (occluded SVG-RCA and sequential section of SVG-diag) Recommendation: Medical management for NSTEMI Given ongoing use of Eliquis  for A-fib, recommend Plavix  and Eliquis  without aspirin  Consider 24-48 hours of heparin  for ACS management before resumption of Eliquis  Newman JINNY Lawrence, MD   Result Date: 05/03/2024 Images from  the original result were not included. Coronary and bypass graft angiography 05/03/2024: LM: No significant disease LAD: Mid 60% stenosis followed by aneurysmal segment, followed by 100% occlusion Lcx: Prox occlusion RCA: Mid occlusion LIMA-LAD: Patent SVG-RCA: Ostially occluded SVG-OM/diag: SVG-OM section patent, diag section occluded (possibly culprit) LVEDP 15 mmHg Conclusion: Severe multivessel CAD Patent 3/4 grafts (occluded sequential section of SVG-diag) Recommendation: Medical management for NSTEMI Given ongoing use of Eliquis  for A-fib, recommend Plavix  and Eliquis  without aspirin  Consider 24-48 hours of heparin  for ACS management before resumption of Eliquis  Newman JINNY Lawrence, MD  CT Angio Chest/Abd/Pel for Dissection W and/or Wo Contrast Result Date: 05/02/2024 EXAM: CTA CHEST, ABDOMEN AND PELVIS WITHOUT AND WITH CONTRAST 05/02/2024 05:09:32 PM TECHNIQUE: CTA of the chest was performed without and with the administration of intravenous contrast. CTA of the abdomen and pelvis was performed with the administration of intravenous contrast. Multiplanar reformatted images are provided for review. MIP images are provided for review. Automated exposure control, iterative reconstruction, and/or weight based adjustment of the mA/kV was utilized to reduce the radiation dose to as low as reasonably achievable. COMPARISON: CT abdomen and pelvis 01/26/2023. CLINICAL HISTORY: Acute aortic syndrome (AAS) suspected; chest/upper abd pain, hypotension. Suspected acute aortic syndrome (AAS); chest pain and upper abdominal pain; hypotension. FINDINGS: VASCULATURE: AORTA: Negative for acute aortic syndrome. 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 No dissection. PULMONARY ARTERIES: Negative for pulmonary embolism. GREAT VESSELS OF AORTIC ARCH: Coronary artery and aortic atherosclerotic calcifications. No dissection. No arterial occlusion or significant  stenosis. CELIAC TRUNK: No acute finding. No occlusion or significant stenosis. SUPERIOR MESENTERIC ARTERY: No acute finding. No occlusion or significant stenosis. INFERIOR MESENTERIC ARTERY: No acute finding. No occlusion or significant stenosis. RENAL ARTERIES: Moderate-to-severe narrowing of the left renal artery at the origin secondary to calcified atherosclerotic plaque. 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 CHEST: MEDIASTINUM: Sternotomy and CABG. Calcified subcarinal lymph node. The heart and pericardium demonstrate no acute abnormality. LUNGS AND PLEURA: Bilateral lower lobe bronchiectasis/bronchiolectasis with associated mild architectural distortion and atelectasis likely due to sequela of prior infection No focal consolidation or pulmonary edema. No evidence of pleural effusion or pneumothorax. THORACIC BONES AND SOFT TISSUES: Sternotomy. No acute bone or soft tissue abnormality. ABDOMEN AND PELVIS: LIVER: The liver is unremarkable. GALLBLADDER AND BILE DUCTS: Cholelithiasis. No evidence of acute cholecystitis. No biliary ductal dilatation. SPLEEN: The spleen is  unremarkable. PANCREAS: The pancreas is unremarkable. ADRENAL GLANDS: Bilateral adrenal glands demonstrate no acute abnormality. KIDNEYS, URETERS AND BLADDER: No stones in the kidneys or ureters. No hydronephrosis. No perinephric or periureteral stranding. Urinary bladder is unremarkable. GI AND BOWEL: Moderate stool in the sigmoid colon and rectum. Postoperative changes about the right colon. Stomach and duodenal sweep demonstrate no acute abnormality. There is no bowel obstruction. No abnormal bowel wall thickening or distension. REPRODUCTIVE: Reproductive organs are unremarkable. PERITONEUM AND RETROPERITONEUM: No ascites or free air. LYMPH NODES: No lymphadenopathy. ABDOMINAL BONES AND SOFT TISSUES: No acute abnormality of the bones. No acute soft tissue  abnormality. IMPRESSION: 1. No evidence of acute aortic syndrome. 2. 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 3. 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 Electronically signed by: Norman Gatlin MD 05/02/2024 05:37 PM EST RP Workstation: [id number]   DG Chest 2 View Result Date: 05/02/2024 CLINICAL DATA:  Chest pain EXAM: CHEST - 2 VIEW COMPARISON:  April 24, 2023 FINDINGS: Stable cardiomediastinal silhouette. Status post coronary artery bypass graft. Elevated left hemidiaphragm with minimal left basilar subsegmental atelectasis. Right lung is clear. Bony thorax is unremarkable. IMPRESSION: Elevated left hemidiaphragm with minimal left basilar subsegmental atelectasis. Electronically Signed   By: Lynwood Landy Raddle M.D.   On: 05/02/2024 14:21    Microbiology: Results for orders placed or performed during the hospital encounter of 04/24/23  SARS Coronavirus 2 by RT PCR (hospital order, performed in [facility] hospital lab) *cepheid single result test* Anterior Nasal Swab     Status: Abnormal   Collection Time: 04/24/23 11:57 AM   Specimen: Anterior Nasal Swab  Result Value Ref Range Status   SARS Coronavirus 2 by RT PCR POSITIVE (A) NEGATIVE Final    Comment: Performed at [facility], [address] [place], [place] [address]    Labs: CBC: Recent Labs  Lab 05/02/24 1320 05/03/24 0245 05/04/24 0201 05/05/24 0250  WBC 9.4 7.8 7.1 5.7  HGB 14.0 12.7* 12.4* 11.7*  HCT 43.8 39.6 38.3* 35.9*  MCV 93.0 92.7 91.8 91.1  PLT 190 150 146* 133*   Basic Metabolic Panel: Recent Labs  Lab 05/02/24 1320 05/03/24 0245 05/04/24 1047 05/05/24 0250  NA 139 137 138 139  K 4.1 4.9 4.4 3.8  CL 103 105 106 107  CO2 20* 24 23 24   GLUCOSE 129* 123* 96 110*  BUN 22 21 19 20   CREATININE 1.58* 1.41* 1.36* 1.38*  CALCIUM  9.1 8.8* 8.9 8.7*   Liver Function Tests: Recent Labs  Lab  05/02/24 1452  AST 36  ALT 35  ALKPHOS 99  BILITOT 0.6  PROT 7.4  ALBUMIN  3.9   CBG: No results for input(s): GLUCAP in the last 168 hours.  Discharge time spent: greater than 30 minutes.  Signed: Nydia Distance, MD [facility] 05/05/2024 "

## 2024-05-05 NOTE — Telephone Encounter (Signed)
 Pharmacy Patient Advocate Encounter  Insurance verification completed.    The patient is insured through [employer]/ [employer]. Patient has Medicare and is not eligible for a copay card, but may be able to apply for patient assistance or Medicare RX Payment Plan (Patient Must reach out to their plan, if eligible for payment plan), if available.    Ran test claim for Jardiance 10mg  tablet and the current 30 day co-pay is $40.81.  Ran test claim for Farxiga 10mg  tablet and the current 30 day co-pay is $36.37.  This test claim was processed through [facility]- copay amounts may vary at other pharmacies due to pharmacy/plan contracts, or as the patient moves through the different stages of their insurance plan.

## 2024-05-05 NOTE — Plan of Care (Signed)
   Problem: Activity: Goal: Risk for activity intolerance will decrease Outcome: Progressing   Problem: Coping: Goal: Level of anxiety will decrease Outcome: Progressing

## 2024-05-05 NOTE — TOC Transition Note (Signed)
 Transition of Care [facility]) - Discharge Note   Patient Details  Name: Bradley Oconnor MRN: [id number] Date of Birth: 04-20-1940  Transition of [facility]) CM/SW Contact:  Waddell Barnie Rama, RN Phone Number: 05/05/2024, 11:33 AM   Clinical Narrative:    For  dc today, caregiver at bedside for transport.  Follow up apt on AVS.   Final next level of care: Home/Self Care Barriers to Discharge: No Barriers Identified   Patient Goals and CMS Choice Patient states their goals for this hospitalization and ongoing recovery are:: return home with caregiver   Choice offered to / list presented to : NA      Discharge Placement                       Discharge Plan and Services Additional resources added to the After Visit Summary for   In-house Referral: NA Discharge Planning Services: CM Consult Post Acute Care Choice: NA          DME Arranged: N/A DME Agency: NA       HH Arranged:  (will check with PCP first)          Social Drivers of Health (SDOH) Interventions SDOH Screenings   Food Insecurity: No Food Insecurity (05/03/2024)  Housing: Low Risk (05/03/2024)  Transportation Needs: No Transportation Needs (05/03/2024)  Utilities: Not At Risk (05/03/2024)  Alcohol Screen: Low Risk (07/16/2022)  Depression (PHQ2-9): Low Risk (04/07/2024)  Financial Resource Strain: Low Risk (07/16/2022)  Physical Activity: Insufficiently Active (07/16/2022)  Social Connections: Moderately Integrated (05/03/2024)  Stress: No Stress Concern Present (07/16/2022)  Tobacco Use: Low Risk (05/03/2024)     Readmission Risk Interventions     No data to display

## 2024-05-05 NOTE — Progress Notes (Addendum)
 "  Progress Note  Patient Name: Bradley Oconnor Date of Encounter: 05/05/2024 [facility] Cardiologist: Alm Clay, MD   Interval Summary    Doing well this morning, just feels weak. Needs to ambulate this morning.   Reassessed the patient after working with cardiac rehab he did well.  Feels stronger.  Ready to go home.  No further chest pain  Vital Signs Vitals:   05/04/24 1920 05/04/24 2321 05/05/24 0325 05/05/24 0823  BP: (!) 97/54 (!) 92/51 (!) 96/51 (!) 97/59  Pulse: 72 61 (!) 55 (!) 58  Resp: 17 19 15  (!) 23  Temp: 98.8 F (37.1 C) 98 F (36.7 C) 98.2 F (36.8 C) 97.9 F (36.6 C)  TempSrc: Oral Oral Oral Oral  SpO2: 97% 97% 97% 98%  Weight:      Height:        Intake/Output Summary (Last 24 hours) at 05/05/2024 0857 Last data filed at 05/05/2024 0827 Gross per 24 hour  Intake 1023.38 ml  Output 775 ml  Net 248.38 ml      05/03/2024    3:26 PM 05/02/2024    1:37 PM 04/07/2024   10:26 AM  Last 3 Weights  Weight (lbs) 154 lb 5.2 oz 158 lb 158 lb 12.8 oz  Weight (kg) 70 kg 71.668 kg 72.031 kg      Telemetry/ECG  Sinus Bradycardia, 50s - Personally Reviewed  Physical Exam  GEN: No acute distress.   Neck: No JVD Cardiac: RRR, no murmurs, rubs, or gallops.  Respiratory: Clear to auscultation bilaterally. GI: Soft, nontender, non-distended  MS: No edema VAS: left radial cath site stable   Assessment & Plan   84 year old male with past medical history of CAD status post 4v CABG '93 (LIMA-LAD, SVG-diagonal, SVG-OM, SVG-PDA), DES to RCA '02, PE/DVT, paroxysmal atrial fibrillation, chronic systolic heart failure, mitral valve regurgitation, aortic valve regurgitation, hypertension, hyperlipidemia, anemia, GERD who presented with chest pain.    Unstable angina -- Presented with an episode of centralized chest pressure which lingered through the morning.  EMS was called and he was given sublingual nitroglycerin  with minimal improvement. Pain essentially  subsided on its own. -- High-sensitivity troponin 62>>57 -- Given his symptoms, underwent cardiac cath 2/4 with patent LIMA-LAD, occluded SVG-RCA and sequential SVG-diag (likely culprit lesion). Recommendations for medical management, treated with heparin  for 36 hrs, now back on Eliquis  -- Considered addition of Imdur  but hx of soft/low BPs suspect he may not tolerate. Defer ranexa  with renal disease  -- continue plavix , no ASA with need for Eliquis  -- seen by CR, => did well with ambulation today.   Chronic combined heart failure -- Echo 02/2024 LVEF of 20 to 30% => managed with torsemide  only.  Euvolemic on exam. -- GDMT: Has had issues with escalation (or even initiation) of therapy in the setting of hypotension.    Paroxysmal atrial fibrillation -- Initially diagnosed during hospitalization 11/2022 in the setting of sepsis pneumonia -- Previously on amiodarone  however discontinued in the setting of QT prolongation -- back on Eliquis     CKD stage IIIb -- Creatinine around 1.4, was elevated to 1.58. down to 1.38   History of PE/DVT -- resume don Eliquis     Valvular heart disease -- Echo 02/2024 with trivial MR, mild to moderate moderate AI   Hyperlipidemia -- LDL 75 -- On Repatha , Crestor  and Zetia     Signed, Manuelita Rummer, NP    ATTENDING ATTESTATION  I have seen, examined and evaluated the patient this morning  on rounds along with Manuelita Rummer, NP.  I then reassessed him after walking with cardiac rehab..  After reviewing all the available data and chart, we discussed the patients laboratory, study & physical findings as well as symptoms in detail.  I agree with the findings, examination as well as impression recommendations as per our discussion.    Attending adjustments noted in italics.   Feeling well.  More stable today.  Feels little bit stronger.  Unfortunately we are not able to titrate medications for GDMT for either HFrEF or CAD due to  hypotension.  Continue lipid management with Repatha , Crestor  and Zetia .  Continue Eliquis  and Plavix   Okay to discharge from cardiac standpoint.   [facility] will sign off.   Medication Recommendations: No new medication changes Other recommendations (labs, testing, etc): N/A Follow up as an outpatient: Will establish outpatient follow-up with APP and then Dr. Anner.     Alm MICAEL Anner, MD, MS Alm Anner, M.D., M.S. Interventional Cardiologist  [facility] Pager # [phone]      For questions or updates, please contact [facility] Please consult [email or web address] for contact info under          "

## 2024-05-05 NOTE — Evaluation (Signed)
 Physical Therapy Evaluation and Discharge Patient Details Name: Bradley Oconnor MRN: [id number] DOB: 09/12/1940 Today's Date: 05/05/2024  History of Present Illness  Pt is an 84 y.o. male admitted 2/3 with unstable angina. PMH:  CAD s/p CABG and DES, chronic systolic heart failure, CKD III, PAF, HTN, HLD  Clinical Impression  PT eval complete. Pt is at/near baseline for functional mobility. CGA transfers and ambulation with rollator. Plan is for d/c home today. No follow up services indicated. No DME needs. PT signing off.        If plan is discharge home, recommend the following: A little help with walking and/or transfers;A little help with bathing/dressing/bathroom   Can travel by private vehicle        Equipment Recommendations None recommended by PT  Recommendations for Other Services       Functional Status Assessment Patient has not had a recent decline in their functional status     Precautions / Restrictions Precautions Precautions: None Recall of Precautions/Restrictions: Intact      Mobility  Bed Mobility Overal bed mobility: Modified Independent             General bed mobility comments: using bed functions    Transfers Overall transfer level: Needs assistance Equipment used: Rollator (4 wheels) Transfers: Sit to/from Stand Sit to Stand: Contact guard assist                Ambulation/Gait Ambulation/Gait assistance: Contact guard assist   Assistive device: Rollator (4 wheels) Gait Pattern/deviations: Step-through pattern Gait velocity: decreased Gait velocity interpretation: 1.31 - 2.62 ft/sec, indicative of limited community ambulator   General Gait Details: Ambulated hallway distances with cardiac rehab just prior to PT eval.  Stairs            Wheelchair Mobility     Tilt Bed    Modified Rankin (Stroke Patients Only)       Balance Overall balance assessment: Needs assistance Sitting-balance support: No upper extremity  supported, Feet supported Sitting balance-Leahy Scale: Good     Standing balance support: Bilateral upper extremity supported, During functional activity, Reliant on assistive device for balance Standing balance-Leahy Scale: Poor                               Pertinent Vitals/Pain Pain Assessment Pain Assessment: No/denies pain    Home Living Family/patient expects to be discharged to:: Private residence Living Arrangements: Other (Comment) (caregiver [place]) Available Help at Discharge: Family;Personal care attendant;Available 24 hours/day Type of Home: House Home Access: Ramped entrance       Home Layout: One level Home Equipment: Wheelchair - manual;Wheelchair - power;Hospital bed;BSC/3in1;Tub bench;Rollator (4 wheels);Rolling Walker (2 wheels);Cane - single point      Prior Function Prior Level of Function : Needs assist             Mobility Comments: amb in home with walking stick and supervision. Upright rollator for community distances. ADLs Comments: caregiver assists with ADLs as needed and provides constant supervision     Extremity/Trunk Assessment   Upper Extremity Assessment Upper Extremity Assessment: Overall WFL for tasks assessed    Lower Extremity Assessment Lower Extremity Assessment: Overall WFL for tasks assessed    Cervical / Trunk Assessment Cervical / Trunk Assessment: Kyphotic  Communication   Communication Communication: No apparent difficulties    Cognition Arousal: Alert Behavior During Therapy: WFL for tasks assessed/performed   PT - Cognitive impairments: No apparent  impairments                         Following commands: Intact       Cueing Cueing Techniques: Verbal cues     General Comments General comments (skin integrity, edema, etc.): VSS on RA    Exercises     Assessment/Plan    PT Assessment Patient does not need any further PT services  PT Problem List         PT Treatment  Interventions      PT Goals (Current goals can be found in the Care Plan section)  Acute Rehab PT Goals Patient Stated Goal: home today PT Goal Formulation: All assessment and education complete, DC therapy    Frequency       Co-evaluation               AM-PAC PT 6 Clicks Mobility  Outcome Measure Help needed turning from your back to your side while in a flat bed without using bedrails?: None Help needed moving from lying on your back to sitting on the side of a flat bed without using bedrails?: A Little Help needed moving to and from a bed to a chair (including a wheelchair)?: A Little Help needed standing up from a chair using your arms (e.g., wheelchair or bedside chair)?: A Little Help needed to walk in hospital room?: A Little Help needed climbing 3-5 steps with a railing? : A Lot 6 Click Score: 18    End of Session   Activity Tolerance: Patient tolerated treatment well Patient left: in chair;with call bell/phone within reach;with family/visitor present Nurse Communication: Mobility status PT Visit Diagnosis: Difficulty in walking, not elsewhere classified (R26.2)    Time: 8958-8940 PT Time Calculation (min) (ACUTE ONLY): 18 min   Charges:   PT Evaluation $PT Eval Low Complexity: 1 Low   PT General Charges $$ ACUTE PT VISIT: 1 Visit         Sari MATSU., PT  Office # [phone]   Erven Sari Shaker 05/05/2024, 11:43 AM

## 2024-05-05 NOTE — TOC Initial Note (Addendum)
 Transition of Care [facility]) - Initial/Assessment Note    Patient Details  Name: Bradley Oconnor MRN: [id number] Date of Birth: 15-Feb-1941  [facility] of [facility]) CM/SW Contact:    Waddell Barnie Rama, RN Phone Number: 05/05/2024, 11:24 AM  Clinical Narrative:                 From home with caregiver, has PCP and insurance on file, states has no HH services in place at this time , has hoyer lift, w/chair, bsc, walker, hemi walker, tub bench at home.  States family member  (caregiver) will transport them home at [employer] and family is support system, states gets medications from [facility] on [address]  Pta self ambulatory with walker.  Patient and caregiver states will check with PCP to see if he needs HH.    Expected Discharge Plan: Home w Home Health Services Barriers to Discharge: No Barriers Identified   Patient Goals and CMS Choice Patient states their goals for this hospitalization and ongoing recovery are:: return home with caregiver   Choice offered to / list presented to : NA      Expected Discharge Plan and Services In-house Referral: NA Discharge Planning Services: CM Consult Post Acute Care Choice: NA Living arrangements for the past 2 months: Single Family Home Expected Discharge Date: 05/05/24               DME Arranged: N/A DME Agency: NA       HH Arranged: Patient Refused HH (will check with PCP first)          Prior Living Arrangements/Services Living arrangements for the past 2 months: Single Family Home Lives with:: Other (Comment) (caregiver) Patient language and need for interpreter reviewed:: Yes Do you feel safe going back to the place where you live?: Yes      Need for Family Participation in Patient Care: Yes (Comment) Care giver support system in place?: No (comment) Current home services: DME (hoyer lift, w/chair, bsc, walker, hemi walker, tub bench) Criminal Activity/Legal Involvement Pertinent to Current Situation/Hospitalization: No  - Comment as needed  Activities of Daily Living   ADL Screening (condition at time of admission) Independently performs ADLs?: Yes (appropriate for developmental age) Is the patient deaf or have difficulty hearing?: No Does the patient have difficulty seeing, even when wearing glasses/contacts?: No Does the patient have difficulty concentrating, remembering, or making decisions?: No  Permission Sought/Granted Permission sought to share information with : Case Manager Permission granted to share information with : Yes, Verbal Permission Granted              Emotional Assessment Appearance:: Appears stated age Attitude/Demeanor/Rapport: Engaged Affect (typically observed): Appropriate Orientation: : Oriented to Self, Oriented to Place, Oriented to  Time, Oriented to Situation Alcohol / Substance Use: Not Applicable Psych Involvement: No (comment)  Admission diagnosis:  Urinary frequency [R35.0] Hypomagnesemia [E83.42] Epigastric pain [R10.13] Chest pain [R07.9] Nonspecific chest pain [R07.9] Adjustment disorder, unspecified type [F43.20] Patient Active Problem List   Diagnosis Date Noted   Unstable angina (HCC) 05/03/2024   Chest pain 05/02/2024   CKD (chronic kidney disease) stage 4, GFR 15-29 ml/min (HCC) 05/02/2024   Stress incontinence (male) (male) 07/30/2023   Carpal tunnel syndrome 06/11/2023   Chronic ulcer of buttock (HCC) 06/11/2023   Congestive heart failure (HCC) 06/11/2023   Muscle weakness 06/11/2023   Pneumonia 06/11/2023   Coronary arteriosclerosis 06/11/2023   Essential hypertension 06/11/2023   Hyperlipidemia 06/11/2023   Primary stress urinary incontinence 06/11/2023  Cholelithiasis 01/26/2023   Intractable lower abdominal pain 01/26/2023   Prolonged QT interval 12/21/2022   Malnutrition of moderate degree 12/19/2022   Persistent atrial fibrillation (HCC) 12/15/2022   Chronic combined systolic and diastolic CHF (congestive heart failure) (HCC)  12/15/2022   Pressure injury of skin 12/15/2022   Low back pain 08/07/2020   Trigger finger of right thumb 04/17/2020   Carpal tunnel syndrome, bilateral 09/26/2019   Shoulder pain, bilateral 08/03/2019   Cervicalgia 03/28/2018   GERD (gastroesophageal reflux disease) 04/10/2013   HTN (hypertension) 04/10/2013   CAD (coronary artery disease) of artery bypass graft 10/04/2012   Hypothyroidism 10/04/2012   Hyperlipidemia with target LDL less than 70 10/04/2012   Kidney stones 01/27/2012   Hx of appendectomy-history of ruptured requiring ileocecectomy (~1994) 01/27/2012   PCP:  Levora Reyes SAUNDERS, MD Pharmacy:   [facility] [address] [place] - [address] [place] [address] Phone: [phone] Fax: [phone]     Social Drivers of Health (SDOH) Social History: SDOH Screenings   Food Insecurity: No Food Insecurity (05/03/2024)  Housing: Low Risk (05/03/2024)  Transportation Needs: No Transportation Needs (05/03/2024)  Utilities: Not At Risk (05/03/2024)  Alcohol Screen: Low Risk (07/16/2022)  Depression (PHQ2-9): Low Risk (04/07/2024)  Financial Resource Strain: Low Risk (07/16/2022)  Physical Activity: Insufficiently Active (07/16/2022)  Social Connections: Moderately Integrated (05/03/2024)  Stress: No Stress Concern Present (07/16/2022)  Tobacco Use: Low Risk (05/03/2024)   SDOH Interventions:     Readmission Risk Interventions     No data to display

## 2024-05-15 ENCOUNTER — Inpatient Hospital Stay: Admitting: Family Medicine

## 2024-05-29 ENCOUNTER — Ambulatory Visit: Admitting: Cardiology

## 2024-07-14 ENCOUNTER — Ambulatory Visit: Admitting: Family Medicine
